# Patient Record
Sex: Male | Born: 1960 | Race: White | Hispanic: No | Marital: Married | Smoking: Never smoker
Health system: Southern US, Community
[De-identification: ages and names within clinical notes are randomized; demographics above are authoritative.]

## PROBLEM LIST (undated history)

## (undated) DIAGNOSIS — I10 Essential (primary) hypertension: Secondary | ICD-10-CM

## (undated) DIAGNOSIS — C189 Malignant neoplasm of colon, unspecified: Secondary | ICD-10-CM

## (undated) DIAGNOSIS — E119 Type 2 diabetes mellitus without complications: Secondary | ICD-10-CM

## (undated) HISTORY — PX: CATARACT EXTRACTION: SUR2

---

## 2009-09-23 ENCOUNTER — Ambulatory Visit (HOSPITAL_COMMUNITY): Admission: RE | Admit: 2009-09-23 | Discharge: 2009-09-23 | Payer: Self-pay | Admitting: Family Medicine

## 2020-12-26 ENCOUNTER — Emergency Department (HOSPITAL_COMMUNITY): Payer: Medicaid Other

## 2020-12-26 ENCOUNTER — Emergency Department (HOSPITAL_COMMUNITY)
Admission: EM | Admit: 2020-12-26 | Discharge: 2020-12-26 | Disposition: A | Discharge status: Home / Self Care | Payer: Medicaid Other | Attending: Emergency Medicine | Admitting: Emergency Medicine

## 2020-12-26 ENCOUNTER — Other Ambulatory Visit: Payer: Self-pay

## 2020-12-26 ENCOUNTER — Encounter (HOSPITAL_COMMUNITY): Payer: Self-pay | Admitting: *Deleted

## 2020-12-26 DIAGNOSIS — I10 Essential (primary) hypertension: Secondary | ICD-10-CM | POA: Diagnosis not present

## 2020-12-26 DIAGNOSIS — C787 Secondary malignant neoplasm of liver and intrahepatic bile duct: Secondary | ICD-10-CM | POA: Insufficient documentation

## 2020-12-26 DIAGNOSIS — R000 Tachycardia, unspecified: Secondary | ICD-10-CM | POA: Diagnosis not present

## 2020-12-26 DIAGNOSIS — C801 Malignant (primary) neoplasm, unspecified: Secondary | ICD-10-CM | POA: Diagnosis not present

## 2020-12-26 DIAGNOSIS — Z794 Long term (current) use of insulin: Secondary | ICD-10-CM | POA: Diagnosis not present

## 2020-12-26 DIAGNOSIS — E119 Type 2 diabetes mellitus without complications: Secondary | ICD-10-CM | POA: Diagnosis not present

## 2020-12-26 DIAGNOSIS — Z79899 Other long term (current) drug therapy: Secondary | ICD-10-CM | POA: Insufficient documentation

## 2020-12-26 DIAGNOSIS — R110 Nausea: Secondary | ICD-10-CM | POA: Diagnosis present

## 2020-12-26 HISTORY — DX: Essential (primary) hypertension: I10

## 2020-12-26 HISTORY — DX: Type 2 diabetes mellitus without complications: E11.9

## 2020-12-26 LAB — CBC WITH DIFFERENTIAL/PLATELET
Abs Immature Granulocytes: 0.15 10*3/uL — ABNORMAL HIGH (ref 0.00–0.07)
Basophils Absolute: 0.2 10*3/uL — ABNORMAL HIGH (ref 0.0–0.1)
Basophils Relative: 1 %
Eosinophils Absolute: 3.1 10*3/uL — ABNORMAL HIGH (ref 0.0–0.5)
Eosinophils Relative: 19 %
HCT: 35 % — ABNORMAL LOW (ref 39.0–52.0)
Hemoglobin: 11.7 g/dL — ABNORMAL LOW (ref 13.0–17.0)
Immature Granulocytes: 1 %
Lymphocytes Relative: 15 %
Lymphs Abs: 2.4 10*3/uL (ref 0.7–4.0)
MCH: 30.3 pg (ref 26.0–34.0)
MCHC: 33.4 g/dL (ref 30.0–36.0)
MCV: 90.7 fL (ref 80.0–100.0)
Monocytes Absolute: 1.2 10*3/uL — ABNORMAL HIGH (ref 0.1–1.0)
Monocytes Relative: 7 %
Neutro Abs: 9.3 10*3/uL — ABNORMAL HIGH (ref 1.7–7.7)
Neutrophils Relative %: 57 %
Platelets: 327 10*3/uL (ref 150–400)
RBC: 3.86 MIL/uL — ABNORMAL LOW (ref 4.22–5.81)
RDW: 16.6 % — ABNORMAL HIGH (ref 11.5–15.5)
Smear Review: NORMAL
WBC: 16.3 10*3/uL — ABNORMAL HIGH (ref 4.0–10.5)
nRBC: 0 % (ref 0.0–0.2)

## 2020-12-26 LAB — URINALYSIS, ROUTINE W REFLEX MICROSCOPIC
Bilirubin Urine: NEGATIVE
Glucose, UA: NEGATIVE mg/dL
Hgb urine dipstick: NEGATIVE
Ketones, ur: NEGATIVE mg/dL
Leukocytes,Ua: NEGATIVE
Nitrite: NEGATIVE
Protein, ur: NEGATIVE mg/dL
Specific Gravity, Urine: 1.016 (ref 1.005–1.030)
pH: 6 (ref 5.0–8.0)

## 2020-12-26 LAB — COMPREHENSIVE METABOLIC PANEL
ALT: 47 U/L — ABNORMAL HIGH (ref 0–44)
AST: 87 U/L — ABNORMAL HIGH (ref 15–41)
Albumin: 2.8 g/dL — ABNORMAL LOW (ref 3.5–5.0)
Alkaline Phosphatase: 543 U/L — ABNORMAL HIGH (ref 38–126)
Anion gap: 12 (ref 5–15)
BUN: 11 mg/dL (ref 6–20)
CO2: 28 mmol/L (ref 22–32)
Calcium: 9.4 mg/dL (ref 8.9–10.3)
Chloride: 94 mmol/L — ABNORMAL LOW (ref 98–111)
Creatinine, Ser: 0.75 mg/dL (ref 0.61–1.24)
GFR, Estimated: >60 mL/min (ref >60)
Glucose, Bld: 153 mg/dL — ABNORMAL HIGH (ref 70–99)
Potassium: 4.4 mmol/L (ref 3.5–5.1)
Sodium: 134 mmol/L — ABNORMAL LOW (ref 135–145)
Total Bilirubin: 2.5 mg/dL — ABNORMAL HIGH (ref 0.3–1.2)
Total Protein: 6.9 g/dL (ref 6.5–8.1)

## 2020-12-26 LAB — LIPASE, BLOOD: Lipase: 92 U/L — ABNORMAL HIGH (ref 11–51)

## 2020-12-26 MED ORDER — ONDANSETRON 4 MG PO TBDP: 4.0000 mg | ORAL_TABLET | Freq: Three times a day (TID) | ORAL | 0 refills | Status: DC | PRN

## 2020-12-26 MED ORDER — SODIUM CHLORIDE 0.9 % IV BOLUS
1000.0000 mL | Freq: Once | INTRAVENOUS | Status: AC
  Administered 2020-12-26: 1000 mL via INTRAVENOUS

## 2020-12-26 MED ORDER — IOHEXOL 300 MG/ML  SOLN
100.0000 mL | Freq: Once | INTRAMUSCULAR | Status: AC | PRN
  Administered 2020-12-26: 100 mL via INTRAVENOUS

## 2020-12-26 MED ORDER — ONDANSETRON HCL 4 MG/2ML IJ SOLN
4.0000 mg | Freq: Once | INTRAMUSCULAR | Status: AC
  Administered 2020-12-26: 4 mg via INTRAVENOUS
  Filled 2020-12-26: qty 2

## 2020-12-26 MED ORDER — FENTANYL CITRATE (PF) 100 MCG/2ML IJ SOLN
50.0000 ug | Freq: Once | INTRAMUSCULAR | Status: AC
  Administered 2020-12-26: 50 ug via INTRAVENOUS
  Filled 2020-12-26: qty 2

## 2020-12-26 NOTE — ED Triage Notes (Signed)
Constipation with nausea x 2 weeks, also has abdominal pain

## 2020-12-26 NOTE — ED Provider Notes (Signed)
Emergency Medicine Provider Triage Evaluation Note  Henry Johns 60 y.o. male was evaluated in triage.  Pt complains of abdominal pain, nausea/vomiting.  He states that he had hemorrhoid about 2 weeks ago.  He saw his primary care doctor and had some treatment for it but states that he was still scared to the bathroom as he thought was going to hurt.  He states he started decreasing his food intake.  He states he has not been able to have a good bowel movement about 2 weeks.  He states yesterday had a very small 1.  He is still passing flatus.  He states that he started having abdominal pain over the last week and is progressively worsened.  Today he started having vomiting, prompting ED visit.  No fevers.  No dysuria, hematuria.  He does have a history of hernia and states it feels slightly worse when pressed on.   Review of Systems  Positive: Abd pain, constipation, vomiting Negative: Fevers, urinary complaints.  Physical Exam  BP 134/82   Pulse 70   Temp 98.2 F (36.8 C) (Oral)   Resp 18   Ht 5\' 4"  (1.626 m)   Wt 65.8 kg   SpO2 100%   BMI 24.89 kg/m  Gen:   Awake, no distress   HEENT:  Atraumatic  Resp:  Normal effort  Cardiac:  Normal rate  Abd:   Slightly distended abdomen.  Diffuse tenderness.  Umbilical hernia noted.  No overlying warmth, erythema. MSK:   Moves extremities without difficulty  Neuro:  Speech clear   Other:      Medical Decision Making  Medically screening exam initiated at 6:00 PM  Appropriate orders placed.  Henry Johns was informed that the remainder of the evaluation will be completed by another provider, this initial triage assessment does not replace that evaluation. They are counseled that they will need to remain in the ED until the completion of their workup, including full H&P and results of any tests.  Risks of leaving the emergency department prior to completion of treatment were discussed. Patient was advised to inform ED staff if they are  leaving before their treatment is complete. The patient acknowledged these risks and time was allowed for questions.     The patient appears stable so that the remainder of the MSE may be completed by another provider.    Clinical Impression  Abd pain   Portions of this note were generated with Dragon dictation software. Dictation errors may occur despite best attempts at proofreading.     Henry Napoleon, PA-C 12/26/20 [place], Scott, MD 12/29/20 [phone]

## 2020-12-26 NOTE — ED Provider Notes (Signed)
[facility] EMERGENCY DEPARTMENT Provider Note   CSN: [id number] Arrival date & time: 12/26/20  1635     History Chief Complaint  Patient presents with   Nausea    Henry Johns is a 60 y.o. male.  HPI 60 year old male presents with nausea, dry heaving and constipation.  Had a hemorrhoid starting 2 weeks ago PCP and got treatment.  The resolved.  Has had minimal to no bowel movements since.  Whenever he does it small and hard.  Sometimes some blood.  Has had lower abdominal discomfort and the nausea/vomiting for about 3 days.  No fevers.  Some back pain when he tries to have a bowel movement.  Has an umbilical/ventral hernia and is worried this is causing problems.  Past Medical History:  Diagnosis Date   Diabetes mellitus without complication ([place])    Hypertension     There are no problems to display for this patient.   History reviewed. No pertinent surgical history.     No family history on file.  Social History   Substance Use Topics   Alcohol use: Not Currently   Drug use: Not Currently    Home Medications Prior to Admission medications   Medication Sig Start Date End Date Taking? Authorizing Provider  atenolol (TENORMIN) 50 MG tablet Take 50 mg by mouth daily. 11/07/20  Yes [provider]  insulin NPH-regular Human (70-30) 100 UNIT/ML injection Inject 15 Units into the skin daily with breakfast.   Yes [provider]  ondansetron (ZOFRAN ODT) 4 MG disintegrating tablet Take 1 tablet (4 mg total) by mouth every 8 (eight) hours as needed for nausea or vomiting. 12/26/20  Yes Sherwood Gambler, MD  PROCTO-MED [facility] 2.5 % rectal cream Apply 1 application topically 3 (three) times daily. 12/13/20  Yes [provider]    Allergies    Patient has no known allergies.  Review of Systems   Review of Systems  Constitutional:  Negative for fever.  Gastrointestinal:  Positive for abdominal pain, constipation, nausea and vomiting.   Musculoskeletal:  Positive for back pain.  All other systems reviewed and are negative.  Physical Exam Updated Vital Signs BP 123/75   Pulse 97   Temp 98.9 F (37.2 C) (Oral)   Resp 18   Ht 5\' 8"  (1.727 m)   Wt 86.2 kg   SpO2 100%   BMI 28.89 kg/m   Physical Exam Vitals and nursing note reviewed.  Constitutional:      General: He is not in acute distress.    Appearance: He is well-developed. He is not ill-appearing or diaphoretic.  HENT:     Head: Normocephalic and atraumatic.     Right Ear: External ear normal.     Left Ear: External ear normal.     Nose: Nose normal.  Eyes:     General:        Right eye: No discharge.        Left eye: No discharge.  Cardiovascular:     Rate and Rhythm: Regular rhythm. Tachycardia present.     Heart sounds: Normal heart sounds.  Pulmonary:     Effort: Pulmonary effort is normal.     Breath sounds: Normal breath sounds.  Abdominal:     Palpations: Abdomen is soft. There is hepatomegaly.     Tenderness: There is no abdominal tenderness.     Hernia: A hernia is present. Hernia is present in the ventral area (sizable hernia without obvious incarceration or tenderness).  Genitourinary:  Comments: No obvious hemorrhoids, masses, or fecal impaction.  There is no significant stool in the rectal vault.  No gross blood. Musculoskeletal:     Cervical back: Neck supple.  Skin:    General: Skin is warm and dry.  Neurological:     Mental Status: He is alert.  Psychiatric:        Mood and Affect: Mood is not anxious.    ED Results / Procedures / Treatments   Labs (all labs ordered are listed, but only abnormal results are displayed) Labs Reviewed  COMPREHENSIVE METABOLIC PANEL - Abnormal; Notable for the following components:      Result Value   Sodium 134 (*)    Chloride 94 (*)    Glucose, Bld 153 (*)    Albumin 2.8 (*)    AST 87 (*)    ALT 47 (*)    Alkaline Phosphatase 543 (*)    Total Bilirubin 2.5 (*)    All other  components within normal limits  CBC WITH DIFFERENTIAL/PLATELET - Abnormal; Notable for the following components:   WBC 16.3 (*)    RBC 3.86 (*)    Hemoglobin 11.7 (*)    HCT 35.0 (*)    RDW 16.6 (*)    Neutro Abs 9.3 (*)    Monocytes Absolute 1.2 (*)    Eosinophils Absolute 3.1 (*)    Basophils Absolute 0.2 (*)    Abs Immature Granulocytes 0.15 (*)    All other components within normal limits  URINALYSIS, ROUTINE W REFLEX MICROSCOPIC - Abnormal; Notable for the following components:   Color, Urine AMBER (*)    All other components within normal limits  LIPASE, BLOOD - Abnormal; Notable for the following components:   Lipase 92 (*)    All other components within normal limits  PATHOLOGIST SMEAR REVIEW    EKG None  Radiology CT Chest Wo Contrast  Result Date: 12/26/2020 CLINICAL DATA:  Abdominal pain, nausea vomiting. Follow-up to an abnormal abdomen and pelvis CT performed earlier today to assess for malignancy of unknown primary. EXAM: CT CHEST WITHOUT CONTRAST TECHNIQUE: Multidetector CT imaging of the chest was performed following the standard protocol without IV contrast. COMPARISON:  Current abdomen and pelvis CT. FINDINGS: Cardiovascular: Heart is normal in size and configuration. No pericardial effusion mild left coronary artery calcifications. Great vessels are normal in caliber. No aortic or arch branch vessel atherosclerotic calcifications. Mediastinum/Nodes: Visualized thyroid is unremarkable. No neck base, axillary, mediastinal or hilar masses or enlarged lymph nodes. Trachea and esophagus are unremarkable. Lungs/Pleura: Several lung nodules. Largest nodule, peripheral right lower lobe, image 96, series 4, 7 mm. Additional nodules: 4 mm nodule, left upper lobe, image 33. 3-4 mm nodule, right upper lobe, image 66. 6 mm nodule, right middle lobe abutting the minor fissure, image 70. Two small calcified granuloma in the right middle lobe. Minor linear atelectasis at the posterior  lung bases. Remainder of the lungs is clear. No pleural effusion or pneumothorax. Upper Abdomen: No change compared to abdomen and pelvis CT obtained earlier today. Musculoskeletal: No fracture or acute finding. No osteoblastic or osteolytic lesions. IMPRESSION: 1. Several small lung nodules consistent with metastatic disease. The primary malignancy remains unclear. The liver morphology suggests underlying cirrhosis, and multifocal hepatocellular carcinoma should be included in the differential diagnosis. 2. No acute abnormality in the chest. Electronically Signed   By: Lajean Manes M.D.   On: 12/26/2020 21:26   CT ABDOMEN PELVIS W CONTRAST  Result Date: 12/26/2020 CLINICAL DATA:  60 year old male with abdominal pain. EXAM: CT ABDOMEN AND PELVIS WITH CONTRAST TECHNIQUE: Multidetector CT imaging of the abdomen and pelvis was performed using the standard protocol following bolus administration of intravenous contrast. CONTRAST:  124mL OMNIPAQUE IOHEXOL 300 MG/ML  SOLN COMPARISON:  None. FINDINGS: Lower chest: There is a 7 mm nodule in the right lung base, likely metastatic disease. Chest CT is recommended for complete evaluation of the lungs. No intra-abdominal free air. Small ascites. Hepatobiliary: Multiple (greater than 20) hepatic hypodense lesions measuring up to 2.9 x 3.4 cm in the left lobe of the liver consistent with metastatic disease. No intrahepatic biliary ductal dilatation. The gallbladder is unremarkable. Pancreas: Unremarkable. No pancreatic ductal dilatation or surrounding inflammatory changes. Spleen: Normal in size without focal abnormality. Adrenals/Urinary Tract: The adrenal glands unremarkable. Subcentimeter right renal interpolar hypodense lesion is not characterized, likely a cyst. There is no hydronephrosis on either side. There is symmetric enhancement and excretion of contrast by both kidneys. The visualized ureters and urinary bladder appear unremarkable. Stomach/Bowel: There is no  bowel obstruction or active inflammation. The appendix is normal. Vascular/Lymphatic: Mild aortoiliac atherosclerotic disease. The IVC is unremarkable. No portal venous gas. Retroperitoneal/aortocaval adenopathy measure up to 16 mm in short axis. There is mild nodularity of the omentum in the right hemiabdomen (42/2). Reproductive: The prostate and seminal vesicles are grossly unremarkable. Other: None Musculoskeletal: Osteopenia with degenerative changes of the spine. No acute osseous pathology. IMPRESSION: 1. Extensive hepatic metastatic disease and retroperitoneal adenopathy. 2. A 7 mm right lung base nodule, most consistent with metastatic disease. 3. No bowel obstruction. Normal appendix. 4. Aortic Atherosclerosis (ICD10-I70.0). Electronically Signed   By: Anner Crete M.D.   On: 12/26/2020 20:45    Procedures Procedures   Medications Ordered in ED Medications  fentaNYL (SUBLIMAZE) injection 50 mcg (50 mcg Intravenous Given 12/26/20 2011)  ondansetron [facility]) injection 4 mg (4 mg Intravenous Given 12/26/20 2011)  sodium chloride 0.9 % bolus 1,000 mL (1,000 mLs Intravenous New Bag/Given 12/26/20 2010)  iohexol (OMNIPAQUE) 300 MG/ML solution 100 mL (100 mLs Intravenous Contrast Given 12/26/20 2017)    ED Course  I have reviewed the triage vital signs and the nursing notes.  Pertinent labs & imaging results that were available during my care of the patient were reviewed by me and considered in my medical decision making (see chart for details).    MDM Rules/Calculators/A&P                          Patient is well-appearing.  He feels symptomatically better after treatment in the ED.  Based on his presentation and labs a CT was ordered and reviewed.  It unfortunately shows numerous liver lesions consistent with metastases.  CT chest added on with a couple nodules that are concerning for metastasis.  No clear primary.  He does not smoke or drink alcohol and did not in the past.  At this point,  I have discussed with Dr. Delton Coombes.  He does have mild LFTs abnormalities including a bilirubin of 2.6.  At this point he is otherwise stable for discharge however and we discussed the need for expedited care as an outpatient and Dr. Tomie China office will follow-up closely to arrange work-up and treatment.  Discussed options with patient and he agrees and we discussed return precautions. Final Clinical Impression(s) / ED Diagnoses Final diagnoses:  Liver metastases ([place])    Rx / DC Orders ED Discharge Orders  Ordered    ondansetron (ZOFRAN ODT) 4 MG disintegrating tablet  Every 8 hours PRN        12/26/20 2216             Sherwood Gambler, MD 12/26/20 2218

## 2020-12-26 NOTE — Discharge Instructions (Addendum)
If you develop worsening, continued, or recurrent abdominal pain, uncontrolled vomiting, fever, chest or back pain, or any other new/concerning symptoms then return to the ER for evaluation.  

## 2020-12-27 LAB — PATHOLOGIST SMEAR REVIEW

## 2021-01-04 ENCOUNTER — Encounter (HOSPITAL_COMMUNITY): Payer: Self-pay | Admitting: Hematology and Oncology

## 2021-01-04 ENCOUNTER — Inpatient Hospital Stay (HOSPITAL_COMMUNITY): Payer: Medicaid Other | Attending: Hematology | Admitting: Hematology and Oncology

## 2021-01-04 ENCOUNTER — Other Ambulatory Visit: Payer: Self-pay

## 2021-01-04 DIAGNOSIS — I10 Essential (primary) hypertension: Secondary | ICD-10-CM | POA: Diagnosis not present

## 2021-01-04 DIAGNOSIS — C7801 Secondary malignant neoplasm of right lung: Secondary | ICD-10-CM | POA: Insufficient documentation

## 2021-01-04 DIAGNOSIS — R112 Nausea with vomiting, unspecified: Secondary | ICD-10-CM

## 2021-01-04 DIAGNOSIS — R110 Nausea: Secondary | ICD-10-CM | POA: Insufficient documentation

## 2021-01-04 DIAGNOSIS — C7800 Secondary malignant neoplasm of unspecified lung: Secondary | ICD-10-CM

## 2021-01-04 DIAGNOSIS — Z794 Long term (current) use of insulin: Secondary | ICD-10-CM | POA: Diagnosis not present

## 2021-01-04 DIAGNOSIS — C787 Secondary malignant neoplasm of liver and intrahepatic bile duct: Secondary | ICD-10-CM | POA: Insufficient documentation

## 2021-01-04 DIAGNOSIS — G893 Neoplasm related pain (acute) (chronic): Secondary | ICD-10-CM | POA: Insufficient documentation

## 2021-01-04 DIAGNOSIS — K625 Hemorrhage of anus and rectum: Secondary | ICD-10-CM | POA: Diagnosis not present

## 2021-01-04 DIAGNOSIS — K5909 Other constipation: Secondary | ICD-10-CM | POA: Insufficient documentation

## 2021-01-04 DIAGNOSIS — E1140 Type 2 diabetes mellitus with diabetic neuropathy, unspecified: Secondary | ICD-10-CM | POA: Diagnosis not present

## 2021-01-04 DIAGNOSIS — M8580 Other specified disorders of bone density and structure, unspecified site: Secondary | ICD-10-CM | POA: Insufficient documentation

## 2021-01-04 DIAGNOSIS — C189 Malignant neoplasm of colon, unspecified: Secondary | ICD-10-CM | POA: Insufficient documentation

## 2021-01-04 DIAGNOSIS — C7802 Secondary malignant neoplasm of left lung: Secondary | ICD-10-CM | POA: Diagnosis not present

## 2021-01-04 MED ORDER — PROCHLORPERAZINE MALEATE 10 MG PO TABS: 10.0000 mg | ORAL_TABLET | Freq: Four times a day (QID) | ORAL | 3 refills | Status: DC | PRN

## 2021-01-04 MED ORDER — POLYETHYLENE GLYCOL 3350 17 G PO PACK: 17.0000 g | PACK | Freq: Every day | ORAL | 3 refills | Status: DC

## 2021-01-04 MED ORDER — MORPHINE SULFATE 15 MG PO TABS: 15.0000 mg | ORAL_TABLET | ORAL | 0 refills | Status: DC | PRN

## 2021-01-04 NOTE — Assessment & Plan Note (Signed)
He has bilateral lung nodules most consistent with metastatic cancer to his lungs He is not symptomatic Observe for now

## 2021-01-04 NOTE — Progress Notes (Signed)
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 [facility]) He has bilateral lung nodules most consistent with metastatic cancer to his lungs He is not symptomatic Observe for now  Other constipation Likely due to primary colorectal cancer I warned him that pain medicine can also cause constipation I recommend daily MiraLAX  Nausea without vomiting Likely due to his cancer I have prescribed antiemetics for him to take as needed  Cancer associated pain We discussed narcotic refill policy I warned him about side effects of treatment I will see him again in 1 to 2 weeks for further follow-up and we will call him in a day or 2 to assess pain control  Diabetes mellitus with neuropathy ([place]) With his progressive weight loss, I recommend he checks his blood sugar regularly and to stop taking insulin if his blood sugar is consistently under 150 to avoid hypoglycemia  Orders Placed This Encounter  Procedures   CT Biopsy    Standing Status:   Future    Standing Expiration Date:   01/04/2022    Order Specific Question:   Lab orders requested (DO NOT place separate lab orders, these will be automatically ordered during procedure specimen collection):    Answer:   Surgical Pathology    Order Specific Question:   Reason for Exam (SYMPTOM  OR DIAGNOSIS REQUIRED)    Answer:   liver lesions    Order Specific Question:   Preferred location?    Answer:   [facility]    Order  Specific Question:   Release to patient    Answer:   Immediate    The total time spent in the appointment was 60 minutes encounter with patients including review of chart and various tests results, discussions about plan of care and coordination of care plan   All questions were answered. The patient knows to call the clinic with any problems, questions or concerns. No barriers to learning was detected.  Heath Lark, MD 6/29/202211:04 AM  CHIEF COMPLAINTS/PURPOSE OF CONSULTATION:  Abnormal imaging study with metastatic cancer in the liver and lungs  HISTORY OF PRESENTING ILLNESS:  Henry Johns 60 y.o. male is here because of recent abnormal CT findings The patient is here accompanied by his wife He has background history of type 2 diabetes on insulin treatment He has mild diabetic neuropathy and eye changes because of this Over the past few weeks, he has not been feeling well He has frequent nausea with vomiting, especially within 1 hour or so after eating.  He saw a physician several weeks ago for hemorrhoidal bleeding and was prescribed a gel that seems to shrink his hemorrhoid somewhat He has sensation of incomplete bowel emptying and passage of bright red blood intermittently He has never had screening colonoscopy He has lost 3 to 4 pounds of weight due to fear of eating.  He has significant pain in his lower abdomen in the sacral area When he presented to the emergency department, he had CT  imaging which show metastatic cancer in his liver and lungs He was prescribed 5 mg of oxycodone that did not help his pain He rated his pain at 10 out of 10 He is currently not working MEDICAL HISTORY:  Past Medical History:  Diagnosis Date   Diabetes mellitus without complication ([place])    Hypertension     SURGICAL HISTORY: History reviewed. No pertinent surgical history.  SOCIAL HISTORY: Social History   Socioeconomic History   Marital status: Single    Spouse name: Not on file    Number of children: Not on file   Years of education: Not on file   Highest education level: Not on file  Occupational History   Not on file  Tobacco Use   Smoking status: Never   Smokeless tobacco: Never  Vaping Use   Vaping Use: Never used  Substance and Sexual Activity   Alcohol use: Not Currently   Drug use: Not Currently   Sexual activity: Not Currently  Other Topics Concern   Not on file  Social History Narrative   Married, no childer   Social Determinants of Health   Financial Resource Strain: Not on file  Food Insecurity: Not on file  Transportation Needs: No Transportation Needs   Lack of Transportation (Medical): No   Lack of Transportation (Non-Medical): No  Physical Activity: Inactive   Days of Exercise per Week: 0 days   Minutes of Exercise per Session: 0 min  Stress: Not on file  Social Connections: Not on file  Intimate Partner Violence: Not At Risk   Fear of Current or Ex-Partner: No   Emotionally Abused: No   Physically Abused: No   Sexually Abused: No    FAMILY HISTORY: Family History  Problem Relation Age of Onset   Cancer Father 4       prostate ca    ALLERGIES:  has No Known Allergies.  MEDICATIONS:  Current Outpatient Medications  Medication Sig Dispense Refill   atenolol (TENORMIN) 50 MG tablet Take 50 mg by mouth daily.     insulin NPH-regular Human (70-30) 100 UNIT/ML injection Inject 15 Units into the skin daily with breakfast.     morphine (MSIR) 15 MG tablet Take 1 tablet (15 mg total) by mouth every 4 (four) hours as needed for severe pain. 30 tablet 0   ondansetron (ZOFRAN ODT) 4 MG disintegrating tablet Take 1 tablet (4 mg total) by mouth every 8 (eight) hours as needed for nausea or vomiting. 10 tablet 0   polyethylene glycol (MIRALAX) 17 g packet Take 17 g by mouth daily. 30 each 3   prochlorperazine (COMPAZINE) 10 MG tablet Take 1 tablet (10 mg total) by mouth every 6 (six) hours as needed for nausea or vomiting. 30 tablet 3    PROCTO-MED HC 2.5 % rectal cream Apply 1 application topically 3 (three) times daily.     No current facility-administered medications for this visit.    REVIEW OF SYSTEMS:   Constitutional: Denies fevers, chills or abnormal night sweats Eyes: Denies blurriness of vision, double vision or watery eyes Ears, nose, mouth, throat, and face: Denies mucositis or sore throat Respiratory: Denies cough, dyspnea or wheezes Cardiovascular: Denies palpitation, chest discomfort or lower extremity swelling Skin: Denies abnormal skin rashes Lymphatics: Denies new lymphadenopathy or easy bruising Neurological:Denies numbness, tingling or new weaknesses Behavioral/Psych: Mood is stable, no new changes  All other systems were reviewed with the patient and are negative.  PHYSICAL EXAMINATION: ECOG PERFORMANCE STATUS: 1 - Symptomatic but  completely ambulatory  Vitals:   01/04/21 0824  BP: (!) 97/57  Pulse: 98  Resp: 18  Temp: 98.7 F (37.1 C)  SpO2: 100%   Filed Weights   01/04/21 0824  Weight: 181 lb (82.1 kg)    GENERAL:alert, no distress and comfortable.  He looks slightly jaundiced SKIN: skin color, texture, turgor are normal, no rashes or significant lesions EYES: normal, conjunctiva are pink and non-injected, sclera clear OROPHARYNX:no exudate, no erythema and lips, buccal mucosa, and tongue normal  NECK: supple, thyroid normal size, non-tender, without nodularity LYMPH:  no palpable lymphadenopathy in the cervical, axillary or inguinal LUNGS: clear to auscultation and percussion with normal breathing effort HEART: regular rate & rhythm and no murmurs and no lower extremity edema ABDOMEN:abdomen soft, palpable right upper quadrant mass Musculoskeletal:no cyanosis of digits and no clubbing  PSYCH: alert & oriented x 3 with fluent speech NEURO: no focal motor/sensory deficits  LABORATORY DATA:  I have reviewed the data as listed Lab Results  Component Value Date   WBC 16.3 (H)  12/26/2020   HGB 11.7 (L) 12/26/2020   HCT 35.0 (L) 12/26/2020   MCV 90.7 12/26/2020   PLT 327 12/26/2020   Recent Labs    12/26/20 1834  NA 134*  K 4.4  CL 94*  CO2 28  GLUCOSE 153*  BUN 11  CREATININE 0.75  CALCIUM 9.4  GFRNONAA >60  PROT 6.9  ALBUMIN 2.8*  AST 87*  ALT 47*  ALKPHOS 543*  BILITOT 2.5*    RADIOGRAPHIC STUDIES: I have reviewed multiple CT imaging with the patient and his wife I have personally reviewed the radiological images as listed and agreed with the findings in the report. CT Chest Wo Contrast  Result Date: 12/26/2020 CLINICAL DATA:  Abdominal pain, nausea vomiting. Follow-up to an abnormal abdomen and pelvis CT performed earlier today to assess for malignancy of unknown primary. EXAM: CT CHEST WITHOUT CONTRAST TECHNIQUE: Multidetector CT imaging of the chest was performed following the standard protocol without IV contrast. COMPARISON:  Current abdomen and pelvis CT. FINDINGS: Cardiovascular: Heart is normal in size and configuration. No pericardial effusion mild left coronary artery calcifications. Great vessels are normal in caliber. No aortic or arch branch vessel atherosclerotic calcifications. Mediastinum/Nodes: Visualized thyroid is unremarkable. No neck base, axillary, mediastinal or hilar masses or enlarged lymph nodes. Trachea and esophagus are unremarkable. Lungs/Pleura: Several lung nodules. Largest nodule, peripheral right lower lobe, image 96, series 4, 7 mm. Additional nodules: 4 mm nodule, left upper lobe, image 33. 3-4 mm nodule, right upper lobe, image 66. 6 mm nodule, right middle lobe abutting the minor fissure, image 70. Two small calcified granuloma in the right middle lobe. Minor linear atelectasis at the posterior lung bases. Remainder of the lungs is clear. No pleural effusion or pneumothorax. Upper Abdomen: No change compared to abdomen and pelvis CT obtained earlier today. Musculoskeletal: No fracture or acute finding. No osteoblastic or  osteolytic lesions. IMPRESSION: 1. Several small lung nodules consistent with metastatic disease. The primary malignancy remains unclear. The liver morphology suggests underlying cirrhosis, and multifocal hepatocellular carcinoma should be included in the differential diagnosis. 2. No acute abnormality in the chest. Electronically Signed   By: Lajean Manes M.D.   On: 12/26/2020 21:26   CT ABDOMEN PELVIS W CONTRAST  Result Date: 12/26/2020 CLINICAL DATA:  60 year old male with abdominal pain. EXAM: CT ABDOMEN AND PELVIS WITH CONTRAST TECHNIQUE: Multidetector CT imaging of the abdomen and pelvis was performed using the standard protocol following  bolus administration of intravenous contrast. CONTRAST:  147mL OMNIPAQUE IOHEXOL 300 MG/ML  SOLN COMPARISON:  None. FINDINGS: Lower chest: There is a 7 mm nodule in the right lung base, likely metastatic disease. Chest CT is recommended for complete evaluation of the lungs. No intra-abdominal free air. Small ascites. Hepatobiliary: Multiple (greater than 20) hepatic hypodense lesions measuring up to 2.9 x 3.4 cm in the left lobe of the liver consistent with metastatic disease. No intrahepatic biliary ductal dilatation. The gallbladder is unremarkable. Pancreas: Unremarkable. No pancreatic ductal dilatation or surrounding inflammatory changes. Spleen: Normal in size without focal abnormality. Adrenals/Urinary Tract: The adrenal glands unremarkable. Subcentimeter right renal interpolar hypodense lesion is not characterized, likely a cyst. There is no hydronephrosis on either side. There is symmetric enhancement and excretion of contrast by both kidneys. The visualized ureters and urinary bladder appear unremarkable. Stomach/Bowel: There is no bowel obstruction or active inflammation. The appendix is normal. Vascular/Lymphatic: Mild aortoiliac atherosclerotic disease. The IVC is unremarkable. No portal venous gas. Retroperitoneal/aortocaval adenopathy measure up to 16 mm in  short axis. There is mild nodularity of the omentum in the right hemiabdomen (42/2). Reproductive: The prostate and seminal vesicles are grossly unremarkable. Other: None Musculoskeletal: Osteopenia with degenerative changes of the spine. No acute osseous pathology. IMPRESSION: 1. Extensive hepatic metastatic disease and retroperitoneal adenopathy. 2. A 7 mm right lung base nodule, most consistent with metastatic disease. 3. No bowel obstruction. Normal appendix. 4. Aortic Atherosclerosis (ICD10-I70.0). Electronically Signed   By: Anner Crete M.D.   On: 12/26/2020 20:45

## 2021-01-04 NOTE — Assessment & Plan Note (Signed)
We discussed narcotic refill policy I warned him about side effects of treatment I will see him again in 1 to 2 weeks for further follow-up and we will call him in a day or 2 to assess pain control

## 2021-01-04 NOTE — Assessment & Plan Note (Signed)
Likely due to primary colorectal cancer I warned him that pain medicine can also cause constipation I recommend daily MiraLAX

## 2021-01-04 NOTE — Assessment & Plan Note (Signed)
With his progressive weight loss, I recommend he checks his blood sugar regularly and to stop taking insulin if his blood sugar is consistently under 150 to avoid hypoglycemia

## 2021-01-04 NOTE — Assessment & Plan Note (Signed)
He has signs of uncompensated liver failure The pattern of disease seen in his liver is most likely metastatic cancer from the GI tract, most likely from his colon The patient had recent changes in bowel habits, rectal bleeding and nausea He had never had screening colonoscopy done I am concerned about potential risk of partial small bowel obstruction I will order urgent GI evaluation for colonoscopy

## 2021-01-04 NOTE — Assessment & Plan Note (Signed)
Likely due to his cancer I have prescribed antiemetics for him to take as needed

## 2021-01-05 ENCOUNTER — Telehealth (INDEPENDENT_AMBULATORY_CARE_PROVIDER_SITE_OTHER): Payer: Self-pay

## 2021-01-05 ENCOUNTER — Encounter (HOSPITAL_COMMUNITY): Payer: Self-pay

## 2021-01-05 ENCOUNTER — Encounter: Payer: Self-pay | Admitting: Internal Medicine

## 2021-01-05 ENCOUNTER — Other Ambulatory Visit (INDEPENDENT_AMBULATORY_CARE_PROVIDER_SITE_OTHER): Payer: Self-pay

## 2021-01-05 DIAGNOSIS — C787 Secondary malignant neoplasm of liver and intrahepatic bile duct: Secondary | ICD-10-CM

## 2021-01-05 MED ORDER — PEG 3350-KCL-NA BICARB-NACL 420 G PO SOLR: 4000.0000 mL | ORAL | 0 refills | Status: DC

## 2021-01-05 NOTE — Telephone Encounter (Signed)
Ok to schedule.  Thanks,  Chimene Salo Castaneda Mayorga, MD Gastroenterology and Hepatology [facility]  

## 2021-01-05 NOTE — Telephone Encounter (Signed)
Referring MD/PCP: [facility]  Procedure: Tcs/Egd  Reason/Indication:  Metastatic Liver Cancer  Has patient had this procedure before?  no  If so, when, by whom and where?    Is there a family history of colon cancer?  no  Who?  What age when diagnosed?    Is patient diabetic? If yes, Type 1 or Type 2   yes type 2      Does patient have prosthetic heart valve or mechanical valve?  no  Do you have a pacemaker/defibrillator?  no  Has patient ever had endocarditis/atrial fibrillation? no  Does patient use oxygen? no  Has patient had joint replacement within last 12 months?  no  Is patient constipated or do they take laxatives? Yes, constipated due to morphine for pain  Does patient have a history of alcohol/drug use?  no  Have you had a stroke/heart attack last 6 mths? no  Do you take medicine for weight loss?  no  For male patients,: do you still have your menstrual cycle? N/A  Is patient on blood thinner such as Coumadin, Plavix and/or Aspirin? no  Medications: Atenolol 50 mgd daily, Compazine 10 mg prn, Morphine 15 mg up to qid, Insulin 70/30 12/5 units bid  Allergies: nkda  Medication Adjustment per Dr Jenetta Downer none  Procedure date & time: 01/13/21 11:05

## 2021-01-05 NOTE — Progress Notes (Unsigned)
       Patient Demographics  Patient Name  Henry Johns, Henry Johns Legal Sex  Male DOB  January 16, 1961 SSN  [id number] Address  [place]  [place] [address] Phone  [phone] [facility])  [phone] (Mobile)     RE: Biopsy Received: Today Markus Daft, MD  Ernestene Mention for US guided liver biopsy.  May have to do small volume paracentesis as well.   [place]         Previous Messages    ----- Message -----  From: Lenore Cordia  Sent: 01/04/2021   4:08 PM EDT  To: [facility] Procedure Requests  Subject: Biopsy                                         Procedure Requested:  CT Biopsy    Reason for Procedure: liver lesions    Provider Requesting:  Dr. Alvy Bimler  Provider Telephone:  [phone]    Other Info:

## 2021-01-05 NOTE — Telephone Encounter (Signed)
LeighAnn Iretha Kirley, CMA  

## 2021-01-06 ENCOUNTER — Other Ambulatory Visit: Payer: Self-pay | Admitting: Student

## 2021-01-06 ENCOUNTER — Encounter (INDEPENDENT_AMBULATORY_CARE_PROVIDER_SITE_OTHER): Payer: Self-pay

## 2021-01-06 ENCOUNTER — Telehealth: Payer: Self-pay

## 2021-01-06 ENCOUNTER — Other Ambulatory Visit: Payer: Self-pay | Admitting: Radiology

## 2021-01-06 NOTE — Telephone Encounter (Signed)
Called and left below message. Ask him to call the office back. ?

## 2021-01-06 NOTE — Telephone Encounter (Signed)
He called back and left a message. He appreciated the call. The medications are helping. He is doing much better. Denies constipation.

## 2021-01-06 NOTE — Telephone Encounter (Signed)
-----   Message from Heath Lark, MD sent at 01/06/2021  7:53 AM EDT ----- Can you check on him and ask how his pain is doing? Make sur ehe is not constipated with pain meds

## 2021-01-10 ENCOUNTER — Other Ambulatory Visit: Payer: Self-pay

## 2021-01-10 ENCOUNTER — Ambulatory Visit (HOSPITAL_COMMUNITY)
Admission: RE | Admit: 2021-01-10 | Discharge: 2021-01-10 | Disposition: A | Discharge status: Home / Self Care | Payer: Medicaid Other | Source: Ambulatory Visit | Attending: Hematology and Oncology | Admitting: Hematology and Oncology

## 2021-01-10 DIAGNOSIS — K769 Liver disease, unspecified: Secondary | ICD-10-CM | POA: Diagnosis present

## 2021-01-10 DIAGNOSIS — E119 Type 2 diabetes mellitus without complications: Secondary | ICD-10-CM | POA: Insufficient documentation

## 2021-01-10 DIAGNOSIS — Z794 Long term (current) use of insulin: Secondary | ICD-10-CM | POA: Diagnosis not present

## 2021-01-10 DIAGNOSIS — C7800 Secondary malignant neoplasm of unspecified lung: Secondary | ICD-10-CM | POA: Diagnosis not present

## 2021-01-10 DIAGNOSIS — C787 Secondary malignant neoplasm of liver and intrahepatic bile duct: Secondary | ICD-10-CM | POA: Diagnosis not present

## 2021-01-10 DIAGNOSIS — I10 Essential (primary) hypertension: Secondary | ICD-10-CM | POA: Diagnosis not present

## 2021-01-10 DIAGNOSIS — Z79899 Other long term (current) drug therapy: Secondary | ICD-10-CM | POA: Insufficient documentation

## 2021-01-10 HISTORY — PX: LIVER BIOPSY: SHX301

## 2021-01-10 LAB — CBC
HCT: 36.5 % — ABNORMAL LOW (ref 39.0–52.0)
Hemoglobin: 12.2 g/dL — ABNORMAL LOW (ref 13.0–17.0)
MCH: 30.3 pg (ref 26.0–34.0)
MCHC: 33.4 g/dL (ref 30.0–36.0)
MCV: 90.8 fL (ref 80.0–100.0)
Platelets: 383 10*3/uL (ref 150–400)
RBC: 4.02 MIL/uL — ABNORMAL LOW (ref 4.22–5.81)
RDW: 14.8 % (ref 11.5–15.5)
WBC: 15.3 10*3/uL — ABNORMAL HIGH (ref 4.0–10.5)
nRBC: 0 % (ref 0.0–0.2)

## 2021-01-10 LAB — GLUCOSE, CAPILLARY: Glucose-Capillary: 134 mg/dL — ABNORMAL HIGH (ref 70–99)

## 2021-01-10 LAB — PROTIME-INR
INR: 1 (ref 0.8–1.2)
Prothrombin Time: 13.6 seconds (ref 11.4–15.2)

## 2021-01-10 MED ORDER — FENTANYL CITRATE (PF) 100 MCG/2ML IJ SOLN
INTRAMUSCULAR | Status: AC
  Filled 2021-01-10: qty 4

## 2021-01-10 MED ORDER — MIDAZOLAM HCL 2 MG/2ML IJ SOLN
INTRAMUSCULAR | Status: AC | PRN
  Administered 2021-01-10: 1 mg via INTRAVENOUS

## 2021-01-10 MED ORDER — GELATIN ABSORBABLE 12-7 MM EX MISC
CUTANEOUS | Status: AC
  Filled 2021-01-10: qty 1

## 2021-01-10 MED ORDER — FENTANYL CITRATE (PF) 100 MCG/2ML IJ SOLN
INTRAMUSCULAR | Status: AC | PRN
  Administered 2021-01-10: 50 ug via INTRAVENOUS

## 2021-01-10 MED ORDER — LIDOCAINE HCL (PF) 1 % IJ SOLN
INTRAMUSCULAR | Status: AC
  Filled 2021-01-10: qty 30

## 2021-01-10 MED ORDER — MIDAZOLAM HCL 2 MG/2ML IJ SOLN
INTRAMUSCULAR | Status: AC
  Filled 2021-01-10: qty 4

## 2021-01-10 MED ORDER — SODIUM CHLORIDE 0.9 % IV SOLN: INTRAVENOUS | Status: DC

## 2021-01-10 NOTE — Patient Instructions (Addendum)
Henry Johns  01/10/2021     @PREFPERIOPPHARMACY @   Your procedure is scheduled on  01/13/2021.  Report to [facility] at  [place] A.M.  Call this number if you have problems the morning of surgery:  [phone]   Remember:  Follow the diet and prep instructions given to you by the office.    Take these medicines the morning of surgery with A SIP OF WATER    atenolol, MSIR(if needed), zofran or compazine (if needed).    Do not wear jewelry, make-up or nail polish.  Do not wear lotions, powders, or perfumes, or deodorant.  Do not shave 48 hours prior to surgery.  Men may shave face and neck.  Do not bring valuables to the hospital.  [facility] is not responsible for any belongings or valuables.  Contacts, dentures or bridgework may not be worn into surgery.  Leave your suitcase in the car.  After surgery it may be brought to your room.  For patients admitted to the hospital, discharge time will be determined by your treatment team.  Patients discharged the day of surgery will not be allowed to drive home and must have someone with them for 24 hours.    Special instructions:   DO NOT smoke tobacco or vape for 24 hours before your procedure.  Please read over the following fact sheets that you were given. Anesthesia Post-op Instructions and Care and Recovery After Surgery      Upper Endoscopy, Adult, Care After This sheet gives you information about how to care for yourself after your procedure. Your health care provider may also give you more specific instructions. If you have problems or questions, contact your health careprovider. What can I expect after the procedure? After the procedure, it is common to have: A sore throat. Mild stomach pain or discomfort. Bloating. Nausea. Follow these instructions at home:  Follow instructions from your health care provider about what to eat or drink after your procedure. Return to your normal activities as told by  your health care provider. Ask your health care provider what activities are safe for you. Take over-the-counter and prescription medicines only as told by your health care provider. If you were given a sedative during the procedure, it can affect you for several hours. Do not drive or operate machinery until your health care provider says that it is safe. Keep all follow-up visits as told by your health care provider. This is important. Contact a health care provider if you have: A sore throat that lasts longer than one day. Trouble swallowing. Get help right away if: You vomit blood or your vomit looks like coffee grounds. You have: A fever. Bloody, black, or tarry stools. A severe sore throat or you cannot swallow. Difficulty breathing. Severe pain in your chest or abdomen. Summary After the procedure, it is common to have a sore throat, mild stomach discomfort, bloating, and nausea. If you were given a sedative during the procedure, it can affect you for several hours. Do not drive or operate machinery until your health care provider says that it is safe. Follow instructions from your health care provider about what to eat or drink after your procedure. Return to your normal activities as told by your health care provider. This information is not intended to replace advice given to you by your health care provider. Make sure you discuss any questions you have with your healthcare provider. Document Revised: 06/23/2019 Document  Reviewed: 11/25/2017 Elsevier Patient Education  2022 [employer]. Colonoscopy, Adult, Care After This sheet gives you information about how to care for yourself after your procedure. Your health care provider may also give you more specific instructions. If you have problems or questions, contact your health careprovider. What can I expect after the procedure? After the procedure, it is common to have: A small amount of blood in your stool for 24 hours after  the procedure. Some gas. Mild cramping or bloating of your abdomen. Follow these instructions at home: Eating and drinking  Drink enough fluid to keep your urine pale yellow. Follow instructions from your health care provider about eating or drinking restrictions. Resume your normal diet as instructed by your health care provider. Avoid heavy or fried foods that are hard to digest.  Activity Rest as told by your health care provider. Avoid sitting for a long time without moving. Get up to take short walks every 1-2 hours. This is important to improve blood flow and breathing. Ask for help if you feel weak or unsteady. Return to your normal activities as told by your health care provider. Ask your health care provider what activities are safe for you. Managing cramping and bloating  Try walking around when you have cramps or feel bloated. Apply heat to your abdomen as told by your health care provider. Use the heat source that your health care provider recommends, such as a moist heat pack or a heating pad. Place a towel between your skin and the heat source. Leave the heat on for 20-30 minutes. Remove the heat if your skin turns bright red. This is especially important if you are unable to feel pain, heat, or cold. You may have a greater risk of getting burned.  General instructions If you were given a sedative during the procedure, it can affect you for several hours. Do not drive or operate machinery until your health care provider says that it is safe. For the first 24 hours after the procedure: Do not sign important documents. Do not drink alcohol. Do your regular daily activities at a slower pace than normal. Eat soft foods that are easy to digest. Take over-the-counter and prescription medicines only as told by your health care provider. Keep all follow-up visits as told by your health care provider. This is important. Contact a health care provider if: You have blood in your  stool 2-3 days after the procedure. Get help right away if you have: More than a small spotting of blood in your stool. Large blood clots in your stool. Swelling of your abdomen. Nausea or vomiting. A fever. Increasing pain in your abdomen that is not relieved with medicine. Summary After the procedure, it is common to have a small amount of blood in your stool. You may also have mild cramping and bloating of your abdomen. If you were given a sedative during the procedure, it can affect you for several hours. Do not drive or operate machinery until your health care provider says that it is safe. Get help right away if you have a lot of blood in your stool, nausea or vomiting, a fever, or increased pain in your abdomen. This information is not intended to replace advice given to you by your health care provider. Make sure you discuss any questions you have with your healthcare provider. Document Revised: 06/19/2019 Document Reviewed: 01/19/2019 Elsevier Patient Education  [place] After This sheet gives you information about how to  care for yourself after your procedure. Your health care provider may also give you more specific instructions. If you have problems or questions, contact your health careprovider. What can I expect after the procedure? After the procedure, it is common to have: Tiredness. Forgetfulness about what happened after the procedure. Impaired judgment for important decisions. Nausea or vomiting. Some difficulty with balance. Follow these instructions at home: For the time period you were told by your health care provider:     Rest as needed. Do not participate in activities where you could fall or become injured. Do not drive or use machinery. Do not drink alcohol. Do not take sleeping pills or medicines that cause drowsiness. Do not make important decisions or sign legal documents. Do not take care of children on your  own. Eating and drinking Follow the diet that is recommended by your health care provider. Drink enough fluid to keep your urine pale yellow. If you vomit: Drink water, juice, or soup when you can drink without vomiting. Make sure you have little or no nausea before eating solid foods. General instructions Have a responsible adult stay with you for the time you are told. It is important to have someone help care for you until you are awake and alert. Take over-the-counter and prescription medicines only as told by your health care provider. If you have sleep apnea, surgery and certain medicines can increase your risk for breathing problems. Follow instructions from your health care provider about wearing your sleep device: Anytime you are sleeping, including during daytime naps. While taking prescription pain medicines, sleeping medicines, or medicines that make you drowsy. Avoid smoking. Keep all follow-up visits as told by your health care provider. This is important. Contact a health care provider if: You keep feeling nauseous or you keep vomiting. You feel light-headed. You are still sleepy or having trouble with balance after 24 hours. You develop a rash. You have a fever. You have redness or swelling around the IV site. Get help right away if: You have trouble breathing. You have new-onset confusion at home. Summary For several hours after your procedure, you may feel tired. You may also be forgetful and have poor judgment. Have a responsible adult stay with you for the time you are told. It is important to have someone help care for you until you are awake and alert. Rest as told. Do not drive or operate machinery. Do not drink alcohol or take sleeping pills. Get help right away if you have trouble breathing, or if you suddenly become confused. This information is not intended to replace advice given to you by your health care provider. Make sure you discuss any questions you  have with your healthcare provider. Document Revised: 03/10/2020 Document Reviewed: 05/28/2019 Elsevier Patient Education  2022 [employer].

## 2021-01-10 NOTE — Procedures (Signed)
Interventional Radiology Procedure Note  Procedure: US guided biopsy of liver mass, left liver Complications: None EBL: None Recommendations: - Bedrest 2 hours.   - Routine wound care - Follow up pathology - Advance diet   Signed,  Corrie Mckusick, DO

## 2021-01-10 NOTE — Consult Note (Signed)
Chief Complaint: Patient was seen in consultation today for image guided liver lesion biopsy and possible paracentesis  Referring Physician(s): Gorsuch,Ni  Supervising Physician: Corrie Mckusick  Patient Status: [facility] - Out-pt  History of Present Illness: Henry Johns is a 60 y.o. male with past medical history of diabetes, hypertension and recent abdominal/back pain along with intermittent nausea, diminished appetite and weight loss.  Subsequent imaging has revealed extensive hepatic metastatic disease and retroperitoneal adenopathy with several small lung nodules. He has no prior history of malignancy.  He presents today for image guided liver lesion biopsy for further evaluation.  Past Medical History:  Diagnosis Date   Diabetes mellitus without complication ([place])    Hypertension     No past surgical history on file.  Allergies: Patient has no known allergies.  Medications: Prior to Admission medications   Medication Sig Start Date End Date Taking? Authorizing Provider  acetaminophen (TYLENOL) 500 MG tablet Take 1,000 mg by mouth every 6 (six) hours as needed for moderate pain.   Yes [provider]  atenolol (TENORMIN) 50 MG tablet Take 50 mg by mouth daily. 11/07/20  Yes [provider]  ibuprofen (ADVIL) 200 MG tablet Take 400 mg by mouth every 6 (six) hours as needed for headache or moderate pain.   Yes [provider]  insulin NPH-regular Human (70-30) 100 UNIT/ML injection Inject 10-12 Units into the skin 2 (two) times daily.   Yes [provider]  morphine (MSIR) 15 MG tablet Take 1 tablet (15 mg total) by mouth every 4 (four) hours as needed for severe pain. 01/04/21  Yes Heath Lark, MD  Multiple Vitamin (MULTIVITAMIN WITH MINERALS) TABS tablet Take 1 tablet by mouth daily.   Yes [provider]  ondansetron (ZOFRAN ODT) 4 MG disintegrating tablet Take 1 tablet (4 mg total) by mouth every 8 (eight) hours as needed for  nausea or vomiting. 12/26/20  Yes Sherwood Gambler, MD  polyethylene glycol (MIRALAX) 17 g packet Take 17 g by mouth daily. 01/04/21  Yes Heath Lark, MD  prochlorperazine (COMPAZINE) 10 MG tablet Take 1 tablet (10 mg total) by mouth every 6 (six) hours as needed for nausea or vomiting. 01/04/21  Yes Gorsuch, Ni, MD  polyethylene glycol-electrolytes (TRILYTE) 420 g solution Take 4,000 mLs by mouth as directed. 01/05/21   Harvel Quale, MD     Family History  Problem Relation Age of Onset   Cancer Father 21       prostate ca    Social History   Socioeconomic History   Marital status: Married    Spouse name: Not on file   Number of children: Not on file   Years of education: Not on file   Highest education level: Not on file  Occupational History   Not on file  Tobacco Use   Smoking status: Never   Smokeless tobacco: Never  Vaping Use   Vaping Use: Never used  Substance and Sexual Activity   Alcohol use: Not Currently   Drug use: Not Currently   Sexual activity: Not Currently  Other Topics Concern   Not on file  Social History Narrative   Married, no childer   Social Determinants of Health   Financial Resource Strain: Not on file  Food Insecurity: Not on file  Transportation Needs: No Transportation Needs   Lack of Transportation (Medical): No   Lack of Transportation (Non-Medical): No  Physical Activity: Inactive   Days of Exercise per Week: 0 days  Minutes of Exercise per Session: 0 min  Stress: Not on file  Social Connections: Not on file      Review of Systems see above; denies fever, headache, chest pain, dyspnea, cough, vomiting; he has had some reported hemorrhoidal bleeding  Vital Signs: BP 114/71   Pulse 61   Temp 98.2 F (36.8 C) (Oral)   Ht 5\' 8"  (1.727 m)   Wt 185 lb (83.9 kg)   SpO2 97%   BMI 28.13 kg/m   Physical Exam awake, alert.  Slightly jaundiced; chest clear to auscultation bilaterally.  Heart with regular rate and rhythm.   Abdomen soft, positive bowel sounds, currently nontender, some soft tissue fullness right upper quadrant?  Liver edge.  No lower extremity edema.  Imaging: CT Chest Wo Contrast  Result Date: 12/26/2020 CLINICAL DATA:  Abdominal pain, nausea vomiting. Follow-up to an abnormal abdomen and pelvis CT performed earlier today to assess for malignancy of unknown primary. EXAM: CT CHEST WITHOUT CONTRAST TECHNIQUE: Multidetector CT imaging of the chest was performed following the standard protocol without IV contrast. COMPARISON:  Current abdomen and pelvis CT. FINDINGS: Cardiovascular: Heart is normal in size and configuration. No pericardial effusion mild left coronary artery calcifications. Great vessels are normal in caliber. No aortic or arch branch vessel atherosclerotic calcifications. Mediastinum/Nodes: Visualized thyroid is unremarkable. No neck base, axillary, mediastinal or hilar masses or enlarged lymph nodes. Trachea and esophagus are unremarkable. Lungs/Pleura: Several lung nodules. Largest nodule, peripheral right lower lobe, image 96, series 4, 7 mm. Additional nodules: 4 mm nodule, left upper lobe, image 33. 3-4 mm nodule, right upper lobe, image 66. 6 mm nodule, right middle lobe abutting the minor fissure, image 70. Two small calcified granuloma in the right middle lobe. Minor linear atelectasis at the posterior lung bases. Remainder of the lungs is clear. No pleural effusion or pneumothorax. Upper Abdomen: No change compared to abdomen and pelvis CT obtained earlier today. Musculoskeletal: No fracture or acute finding. No osteoblastic or osteolytic lesions. IMPRESSION: 1. Several small lung nodules consistent with metastatic disease. The primary malignancy remains unclear. The liver morphology suggests underlying cirrhosis, and multifocal hepatocellular carcinoma should be included in the differential diagnosis. 2. No acute abnormality in the chest. Electronically Signed   By: Lajean Manes M.D.    On: 12/26/2020 21:26   CT ABDOMEN PELVIS W CONTRAST  Result Date: 12/26/2020 CLINICAL DATA:  60 year old male with abdominal pain. EXAM: CT ABDOMEN AND PELVIS WITH CONTRAST TECHNIQUE: Multidetector CT imaging of the abdomen and pelvis was performed using the standard protocol following bolus administration of intravenous contrast. CONTRAST:  111mL OMNIPAQUE IOHEXOL 300 MG/ML  SOLN COMPARISON:  None. FINDINGS: Lower chest: There is a 7 mm nodule in the right lung base, likely metastatic disease. Chest CT is recommended for complete evaluation of the lungs. No intra-abdominal free air. Small ascites. Hepatobiliary: Multiple (greater than 20) hepatic hypodense lesions measuring up to 2.9 x 3.4 cm in the left lobe of the liver consistent with metastatic disease. No intrahepatic biliary ductal dilatation. The gallbladder is unremarkable. Pancreas: Unremarkable. No pancreatic ductal dilatation or surrounding inflammatory changes. Spleen: Normal in size without focal abnormality. Adrenals/Urinary Tract: The adrenal glands unremarkable. Subcentimeter right renal interpolar hypodense lesion is not characterized, likely a cyst. There is no hydronephrosis on either side. There is symmetric enhancement and excretion of contrast by both kidneys. The visualized ureters and urinary bladder appear unremarkable. Stomach/Bowel: There is no bowel obstruction or active inflammation. The appendix is normal. Vascular/Lymphatic: Mild aortoiliac  atherosclerotic disease. The IVC is unremarkable. No portal venous gas. Retroperitoneal/aortocaval adenopathy measure up to 16 mm in short axis. There is mild nodularity of the omentum in the right hemiabdomen (42/2). Reproductive: The prostate and seminal vesicles are grossly unremarkable. Other: None Musculoskeletal: Osteopenia with degenerative changes of the spine. No acute osseous pathology. IMPRESSION: 1. Extensive hepatic metastatic disease and retroperitoneal adenopathy. 2. A 7 mm right  lung base nodule, most consistent with metastatic disease. 3. No bowel obstruction. Normal appendix. 4. Aortic Atherosclerosis (ICD10-I70.0). Electronically Signed   By: Anner Crete M.D.   On: 12/26/2020 20:45    Labs:  CBC: Recent Labs    12/26/20 1834  WBC 16.3*  HGB 11.7*  HCT 35.0*  PLT 327    COAGS: No results for input(s): INR, APTT in the last 8760 hours.  BMP: Recent Labs    12/26/20 1834  NA 134*  K 4.4  CL 94*  CO2 28  GLUCOSE 153*  BUN 11  CALCIUM 9.4  CREATININE 0.75  GFRNONAA >60    LIVER FUNCTION TESTS: Recent Labs    12/26/20 1834  BILITOT 2.5*  AST 87*  ALT 47*  ALKPHOS 543*  PROT 6.9  ALBUMIN 2.8*    TUMOR MARKERS: No results for input(s): AFPTM, CEA, CA199, CHROMGRNA in the last 8760 hours.  Assessment and Plan: 60 y.o. male with past medical history of diabetes, hypertension and recent abdominal/back pain along with intermittent nausea, diminished appetite and weight loss.  Subsequent imaging has revealed extensive hepatic metastatic disease and retroperitoneal adenopathy with several small lung nodules. He has no prior history of malignancy.  He presents today for image guided liver lesion biopsy for further evaluation.Risks and benefits of procedure was discussed with the patient including, but not limited to bleeding, infection, damage to adjacent structures or low yield requiring additional tests.  All of the questions were answered and there is agreement to proceed.  Consent signed and in chart.    Thank you for this interesting consult.  I greatly enjoyed meeting Henry Johns and look forward to participating in their care.  A copy of this report was sent to the requesting provider on this date.  Electronically Signed: D. Rowe Robert, PA-C 01/10/2021, 11:49 AM   I spent a total of  25 minutes   in face to face in clinical consultation, greater than 50% of which was counseling/coordinating care for image guided liver  lesion biopsy

## 2021-01-11 ENCOUNTER — Encounter (HOSPITAL_COMMUNITY)
Admission: RE | Admit: 2021-01-11 | Discharge: 2021-01-11 | Disposition: A | Discharge status: Home / Self Care | Payer: Medicaid Other | Source: Ambulatory Visit | Attending: Gastroenterology | Admitting: Gastroenterology

## 2021-01-11 ENCOUNTER — Other Ambulatory Visit: Payer: Self-pay

## 2021-01-11 ENCOUNTER — Encounter (HOSPITAL_COMMUNITY): Payer: Self-pay

## 2021-01-11 DIAGNOSIS — I4510 Unspecified right bundle-branch block: Secondary | ICD-10-CM | POA: Insufficient documentation

## 2021-01-11 DIAGNOSIS — Z01810 Encounter for preprocedural cardiovascular examination: Secondary | ICD-10-CM | POA: Diagnosis not present

## 2021-01-13 ENCOUNTER — Encounter: Payer: Self-pay | Admitting: Hematology and Oncology

## 2021-01-13 ENCOUNTER — Encounter (HOSPITAL_COMMUNITY): Payer: Self-pay | Admitting: Gastroenterology

## 2021-01-13 ENCOUNTER — Ambulatory Visit (HOSPITAL_COMMUNITY): Payer: Medicaid Other | Admitting: Anesthesiology

## 2021-01-13 ENCOUNTER — Encounter (HOSPITAL_COMMUNITY)
Admission: RE | Disposition: A | Discharge status: Home / Self Care | Payer: Self-pay | Source: Home / Self Care | Attending: Gastroenterology

## 2021-01-13 ENCOUNTER — Ambulatory Visit (HOSPITAL_COMMUNITY)
Admission: RE | Admit: 2021-01-13 | Discharge: 2021-01-13 | Disposition: A | Discharge status: Home / Self Care | Payer: Medicaid Other | Attending: Gastroenterology | Admitting: Gastroenterology

## 2021-01-13 ENCOUNTER — Other Ambulatory Visit: Payer: Self-pay | Admitting: Hematology and Oncology

## 2021-01-13 DIAGNOSIS — C189 Malignant neoplasm of colon, unspecified: Secondary | ICD-10-CM

## 2021-01-13 DIAGNOSIS — C187 Malignant neoplasm of sigmoid colon: Secondary | ICD-10-CM | POA: Insufficient documentation

## 2021-01-13 DIAGNOSIS — K2980 Duodenitis without bleeding: Secondary | ICD-10-CM | POA: Insufficient documentation

## 2021-01-13 DIAGNOSIS — I10 Essential (primary) hypertension: Secondary | ICD-10-CM | POA: Insufficient documentation

## 2021-01-13 DIAGNOSIS — Z79899 Other long term (current) drug therapy: Secondary | ICD-10-CM | POA: Insufficient documentation

## 2021-01-13 DIAGNOSIS — C184 Malignant neoplasm of transverse colon: Secondary | ICD-10-CM | POA: Diagnosis not present

## 2021-01-13 DIAGNOSIS — Z794 Long term (current) use of insulin: Secondary | ICD-10-CM | POA: Insufficient documentation

## 2021-01-13 DIAGNOSIS — K3189 Other diseases of stomach and duodenum: Secondary | ICD-10-CM | POA: Insufficient documentation

## 2021-01-13 DIAGNOSIS — K5669 Other partial intestinal obstruction: Secondary | ICD-10-CM | POA: Insufficient documentation

## 2021-01-13 DIAGNOSIS — E119 Type 2 diabetes mellitus without complications: Secondary | ICD-10-CM | POA: Insufficient documentation

## 2021-01-13 DIAGNOSIS — D123 Benign neoplasm of transverse colon: Secondary | ICD-10-CM

## 2021-01-13 DIAGNOSIS — C787 Secondary malignant neoplasm of liver and intrahepatic bile duct: Secondary | ICD-10-CM | POA: Insufficient documentation

## 2021-01-13 DIAGNOSIS — R590 Localized enlarged lymph nodes: Secondary | ICD-10-CM | POA: Insufficient documentation

## 2021-01-13 HISTORY — PX: POLYPECTOMY: SHX5525

## 2021-01-13 HISTORY — PX: ESOPHAGOGASTRODUODENOSCOPY (EGD) WITH PROPOFOL: SHX5813

## 2021-01-13 HISTORY — PX: COLONOSCOPY WITH PROPOFOL: SHX5780

## 2021-01-13 HISTORY — PX: BIOPSY: SHX5522

## 2021-01-13 LAB — GLUCOSE, CAPILLARY: Glucose-Capillary: 108 mg/dL — ABNORMAL HIGH (ref 70–99)

## 2021-01-13 SURGERY — COLONOSCOPY WITH PROPOFOL
Anesthesia: General

## 2021-01-13 MED ORDER — LACTATED RINGERS IV SOLN: INTRAVENOUS | Status: DC

## 2021-01-13 MED ORDER — SPOT INK MARKER SYRINGE KIT
PACK | SUBMUCOSAL | Status: DC | PRN
  Administered 2021-01-13: 2 mL via SUBMUCOSAL

## 2021-01-13 MED ORDER — PROPOFOL 500 MG/50ML IV EMUL
INTRAVENOUS | Status: DC | PRN
  Administered 2021-01-13: 150 ug/kg/min via INTRAVENOUS

## 2021-01-13 MED ORDER — PHENYLEPHRINE HCL (PRESSORS) 10 MG/ML IV SOLN
INTRAVENOUS | Status: DC | PRN
  Administered 2021-01-13 (×10): 80 ug via INTRAVENOUS

## 2021-01-13 MED ORDER — PROPOFOL 10 MG/ML IV BOLUS
INTRAVENOUS | Status: DC | PRN
  Administered 2021-01-13: 100 mg via INTRAVENOUS
  Administered 2021-01-13: 30 mg via INTRAVENOUS

## 2021-01-13 MED ORDER — EPHEDRINE SULFATE 50 MG/ML IJ SOLN
INTRAMUSCULAR | Status: DC | PRN
  Administered 2021-01-13: 10 mg via INTRAVENOUS
  Administered 2021-01-13: 5 mg via INTRAVENOUS
  Administered 2021-01-13 (×2): 10 mg via INTRAVENOUS

## 2021-01-13 MED ORDER — LIDOCAINE HCL (CARDIAC) PF 100 MG/5ML IV SOSY
PREFILLED_SYRINGE | INTRAVENOUS | Status: DC | PRN
  Administered 2021-01-13: 50 mg via INTRAVENOUS

## 2021-01-13 MED ORDER — STERILE WATER FOR IRRIGATION IR SOLN
Status: DC | PRN
  Administered 2021-01-13: 1.5 mL

## 2021-01-13 NOTE — H&P (Signed)
Henry Johns is an 60 y.o. male.   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  The patient was seen by the ER initially after he presented worsening abdominal pain.  He underwent cross-sectional abdominal imaging in his chest and abdomen that confirmed metastatic cancer.  The patient was seen by the oncology team who referred him to my office.  He had presented some episodes of rectal bleeding in the past and some constipation worsening abdominal pain in his lower abdomen.  He was able to improve his bowel movements with laxatives but he has presented some occasional lower abdominal pain.  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  Past Medical History:  Diagnosis Date   Diabetes mellitus without complication ([place])    Hypertension     Past Surgical History:  Procedure Laterality Date   CATARACT EXTRACTION Right    LIVER BIOPSY  01/10/2021   U/S guided    Family History  Problem Relation Age of Onset   Cancer Father 25       prostate ca   Social History:  reports that he has never smoked. He has never used smokeless tobacco. He reports previous alcohol use. He reports previous drug use.  Allergies: No Known Allergies  Medications Prior to Admission  Medication Sig Dispense Refill   acetaminophen (TYLENOL) 500 MG tablet Take 1,000 mg by mouth every 6 (six) hours as needed for moderate pain.     atenolol (TENORMIN) 50 MG tablet Take 50 mg by mouth daily.     ibuprofen (ADVIL) 200 MG tablet Take 400 mg by mouth every 6 (six) hours as needed for headache or moderate pain.     insulin NPH-regular Human (70-30) 100 UNIT/ML injection Inject 10-12  Units into the skin 2 (two) times daily.     morphine (MSIR) 15 MG tablet Take 1 tablet (15 mg total) by mouth every 4 (four) hours as needed for severe pain. 30 tablet 0   polyethylene glycol (MIRALAX) 17 g packet Take 17 g by mouth daily. 30 each 3   polyethylene glycol-electrolytes (TRILYTE) 420 g solution Take 4,000 mLs by mouth as directed. 4000 mL 0   prochlorperazine (COMPAZINE) 10 MG tablet Take 1 tablet (10 mg total) by mouth every 6 (six) hours as needed for nausea or vomiting. 30 tablet 3   Multiple Vitamin (MULTIVITAMIN WITH MINERALS) TABS tablet Take 1 tablet by mouth daily.     ondansetron (ZOFRAN ODT) 4 MG disintegrating tablet Take 1 tablet (4 mg total) by mouth every 8 (eight) hours as needed for nausea or vomiting. 10 tablet 0    Results for orders placed or performed during the hospital encounter of 01/13/21 (from the past 48 hour(s))  Glucose, capillary     Status: Abnormal   Collection Time: 01/13/21  9:44 AM  Result Value Ref Range   Glucose-Capillary 108 (H) 70 - 99 mg/dL    Comment: Glucose reference range applies only to samples taken after fasting for at least 8 hours.   No results found.  Review of Systems  Constitutional: Negative.   HENT: Negative.    Eyes: Negative.   Respiratory: Negative.    Cardiovascular: Negative.   Gastrointestinal:  Positive for abdominal  pain and constipation.  Endocrine: Negative.   Genitourinary: Negative.   Musculoskeletal: Negative.   Skin: Negative.   Allergic/Immunologic: Negative.   Neurological: Negative.   Hematological: Negative.   Psychiatric/Behavioral: Negative.     Blood pressure 112/70, pulse 74, temperature 98.3 F (36.8 C), temperature source Oral, resp. rate 17, SpO2 99 %. Physical Exam  GENERAL: The patient is AO x3, in no acute distress. HEENT: Head is normocephalic and atraumatic. EOMI are intact. Mouth is well hydrated and without lesions. NECK: Supple. No masses LUNGS: Clear to auscultation. No  presence of rhonchi/wheezing/rales. Adequate chest expansion HEART: RRR, normal s1 and s2. ABDOMEN: Soft, nontender, no guarding, no peritoneal signs, and nondistended. BS +. Presence of a umbilical hernia. EXTREMITIES: Without any cyanosis, clubbing, rash, lesions or edema. NEUROLOGIC: AOx3, no focal motor deficit. SKIN: no jaundice, no rashes  Assessment/Plan 60 year old male with past medical history of Hypertension and diabetes, who was recently  found to have extensive metastatic lesions in his liver, as well as retroperitoneal adenopathy, and 7 mm right lung base nodule.  We will proceed with EGD and colonoscopy to evaluate for source of metastatic cancer.  Harvel Quale, MD 01/13/2021, 10:10 AM

## 2021-01-13 NOTE — Transfer of Care (Signed)
Immediate Anesthesia Transfer of Care Note  Patient: Henry Johns  Procedure(s) Performed: COLONOSCOPY WITH PROPOFOL ESOPHAGOGASTRODUODENOSCOPY (EGD) WITH PROPOFOL BIOPSY POLYPECTOMY  Patient Location: Short Stay  Anesthesia Type:General  Level of Consciousness: drowsy  Airway & Oxygen Therapy: Patient Spontanous Breathing  Post-op Assessment: Report given to RN and Post -op Vital signs reviewed and stable  Post vital signs: Reviewed and stable  Last Vitals:  Vitals Value Taken Time  BP    Temp    Pulse    Resp    SpO2      Last Pain:  Vitals:   01/13/21 1011  TempSrc:   PainSc: 0-No pain         Complications: No notable events documented.

## 2021-01-13 NOTE — Op Note (Addendum)
[facility] Patient Name: Henry Johns Procedure Date: 01/13/2021 9:59 AM MRN: [id number] Date of Birth: [birth year] Attending MD: Maylon Peppers ,  CSN: [id number] Age: 60 Admit Type: Outpatient Procedure:                Upper GI endoscopy Indications:              Metastatic malignancy to liver Providers:                Maylon Peppers, Caprice Kluver, Raphael Gibney,                            Technician Referring MD:              Medicines:                Monitored Anesthesia Care Complications:            No immediate complications. Estimated Blood Loss:     Estimated blood loss: none. Procedure:                Pre-Anesthesia Assessment:                           - Prior to the procedure, a History and Physical                            was performed, and patient medications, allergies                            and sensitivities were reviewed. The patient's                            tolerance of previous anesthesia was reviewed.                           - The risks and benefits of the procedure and the                            sedation options and risks were discussed with the                            patient. All questions were answered and informed                            consent was obtained.                           - ASA Grade Assessment: III - A patient with severe                            systemic disease.                           After obtaining informed consent, the endoscope was                            passed under direct vision. Throughout the  procedure, the patient's blood pressure, pulse, and                            oxygen saturations were monitored continuously. The                            GIF-H190 ([id number]) scope was introduced through the                            mouth, and advanced to the third part of duodenum.                            The upper GI endoscopy was accomplished without                             difficulty. The patient tolerated the procedure                            well. Scope In: 10:17:08 AM Scope Out: 10:23:50 AM Total Procedure Duration: 0 hours 6 minutes 42 seconds  Findings:      The examined esophagus was normal.      The Z-line was regular and was found 40 cm from the incisors.      The entire examined stomach was normal.      Localized nodular mucosa was found in the duodenal bulb. Biopsies were       taken with a cold forceps for histology.      The exam of the duodenum was otherwise normal. Impression:               - Normal esophagus.                           - Z-line regular, 40 cm from the incisors.                           - Normal stomach.                           - Nodular mucosa in the duodenal bulb. Biopsied. Moderate Sedation:      Per Anesthesia Care Recommendation:           - Discharge patient to home (ambulatory).                           - Resume previous diet.                           - Await pathology results. Procedure Code(s):        --- Professional ---                           [phone], Esophagogastroduodenoscopy, flexible,                            transoral; with biopsy, single or multiple Diagnosis Code(s):        --- Professional ---  K31.89, Other diseases of stomach and duodenum                           C78.7, Secondary malignant neoplasm of liver and                            intrahepatic bile duct CPT copyright 2019 American Medical Association. All rights reserved. The codes documented in this report are preliminary and upon coder review may  be revised to meet current compliance requirements. Maylon Peppers, MD Maylon Peppers,  01/13/2021 10:27:37 AM This report has been signed electronically. Number of Addenda: 0

## 2021-01-13 NOTE — Anesthesia Preprocedure Evaluation (Signed)
Anesthesia Evaluation  Patient identified by MRN, date of birth, ID band Patient awake    Reviewed: Allergy & Precautions, NPO status , Patient's Chart, lab work & pertinent test results, reviewed documented beta blocker date and time   History of Anesthesia Complications Negative for: history of anesthetic complications  Airway Mallampati: II  TM Distance: >3 FB Neck ROM: Full    Dental  (+) Dental Advisory Given, Missing   Pulmonary  Metastasis to lungs   breath sounds clear to auscultation       Cardiovascular Exercise Tolerance: Good hypertension, Pt. on medications and Pt. on home beta blockers  Rhythm:Regular Rate:Normal     Neuro/Psych  Neuromuscular disease negative psych ROS   GI/Hepatic negative GI ROS, (+) Cirrhosis  (metastatic liver disease)      ,   Endo/Other  diabetes, Well Controlled, Type 2, Oral Hypoglycemic Agents  Renal/GU negative Renal ROS     Musculoskeletal negative musculoskeletal ROS (+)   Abdominal   Peds  Hematology negative hematology ROS (+)   Anesthesia Other Findings   Reproductive/Obstetrics negative OB ROS                             Anesthesia Physical Anesthesia Plan  ASA: 4  Anesthesia Plan: General   Post-op Pain Management:    Induction: Intravenous  PONV Risk Score and Plan: Propofol infusion  Airway Management Planned: Nasal Cannula and Natural Airway  Additional Equipment:   Intra-op Plan:   Post-operative Plan: Possible Post-op intubation/ventilation  Informed Consent: I have reviewed the patients History and Physical, chart, labs and discussed the procedure including the risks, benefits and alternatives for the proposed anesthesia with the patient or authorized representative who has indicated his/her understanding and acceptance.       Plan Discussed with: CRNA and Surgeon  Anesthesia Plan Comments:          Anesthesia Quick Evaluation

## 2021-01-13 NOTE — Op Note (Signed)
[facility] Patient Name: Henry Johns Procedure Date: 01/13/2021 10:27 AM MRN: [id number] Date of Birth: 1960-12-24 Attending MD: Maylon Peppers ,  CSN: [id number] Age: 60 Admit Type: Outpatient Procedure:                Colonoscopy Indications:              Metastatic malignancy to liver Providers:                Maylon Peppers, Caprice Kluver, Raphael Gibney,                            Technician Referring MD:             Heath Lark Medicines:                Monitored Anesthesia Care Complications:            No immediate complications. Estimated Blood Loss:     Estimated blood loss: none. Procedure:                Pre-Anesthesia Assessment:                           - Prior to the procedure, a History and Physical                            was performed, and patient medications, allergies                            and sensitivities were reviewed. The patient's                            tolerance of previous anesthesia was reviewed.                           - The risks and benefits of the procedure and the                            sedation options and risks were discussed with the                            patient. All questions were answered and informed                            consent was obtained.                           - ASA Grade Assessment: III - A patient with severe                            systemic disease.                           After obtaining informed consent, the colonoscope                            was passed under direct vision. Throughout the  procedure, the patient's blood pressure, pulse, and                            oxygen saturations were monitored continuously. The                            PCF-H190DL ([id number]) scope was introduced through                            the anus and advanced to the the sigmoid colon to                            examine a mass. This was the intended extent. The                             PCF-PH190L ([id number]) scope was introduced through                            the anus and advanced to the the cecum, identified                            by appendiceal orifice and ileocecal valve. The                            colonoscopy was performed without difficulty. The                            patient tolerated the procedure well. Scope In: 10:29:24 AM Scope Out: 11:06:20 AM Scope Withdrawal Time: 0 hours 22 minutes 52 seconds  Total Procedure Duration: 0 hours 36 minutes 56 seconds  Findings:      The perianal and digital rectal examinations were normal.      Two sessile polyps were found in the transverse colon. The polyps were 3       to 5 mm in size. These polyps were removed with a cold snare. Resection       and retrieval were complete.      A frond-like/villous and ulcerated partially obstructing large mass was       found at 17 cm proximal to the anus. The mass was circumferential. The       regular pediatric scope could not The mass measured five cm in length.       No bleeding was present. This was biopsied with a cold forceps for       histology. Two areas proximal and distal to the area were tattooed with       an injection of 4 mL of [place] ink.      The retroflexed view of the distal rectum and anal verge was normal and       showed no anal or rectal abnormalities. Impression:               - Two 3 to 5 mm polyps in the transverse colon,                            removed with a cold snare. Resected and retrieved.                           -  Malignant partially obstructing tumor at 17 cm                            proximal to the anus. Biopsied. Tattooed.                           - The distal rectum and anal verge are normal on                            retroflexion view. Moderate Sedation:      Per Anesthesia Care Recommendation:           - Discharge patient to home (ambulatory).                           - High fiber diet.                            - Await pathology results.                           - Repeat colonoscopy for surveillance based on                            pathology results.                           - Check CEA titer.                           - Follow up with Dr. Heath Lark (oncology)                            regarding treatment options for colon                            adenocarcinoma.                           - Continue with daily Miralax, needs tos have loose                            to watery BMs daily. Procedure Code(s):        --- Professional ---                           [phone], Colonoscopy, flexible; with removal of                            tumor(s), polyp(s), or other lesion(s) by snare                            technique                           01410, Colonoscopy, flexible; with directed  submucosal injection(s), any substance                           45380, 39, Colonoscopy, flexible; with biopsy,                            single or multiple Diagnosis Code(s):        --- Professional ---                           K63.5, Polyp of colon                           C18.9, Malignant neoplasm of colon, unspecified                           K56.690, Other partial intestinal obstruction                           C78.7, Secondary malignant neoplasm of liver and                            intrahepatic bile duct CPT copyright 2019 American Medical Association. All rights reserved. The codes documented in this report are preliminary and upon coder review may  be revised to meet current compliance requirements. Maylon Peppers, MD Maylon Peppers,  01/13/2021 11:22:31 AM This report has been signed electronically. Number of Addenda: 0

## 2021-01-13 NOTE — Anesthesia Postprocedure Evaluation (Signed)
Anesthesia Post Note  Patient: CHRISTIPHER RIEGER  Procedure(s) Performed: COLONOSCOPY WITH PROPOFOL ESOPHAGOGASTRODUODENOSCOPY (EGD) WITH PROPOFOL BIOPSY POLYPECTOMY  Patient location during evaluation: PACU Anesthesia Type: General Level of consciousness: awake and alert and oriented Pain management: pain level controlled Vital Signs Assessment: post-procedure vital signs reviewed and stable Respiratory status: spontaneous breathing and respiratory function stable Cardiovascular status: blood pressure returned to baseline and stable Postop Assessment: no apparent nausea or vomiting Anesthetic complications: no   No notable events documented.   Last Vitals:  Vitals:   01/13/21 1116 01/13/21 1132  BP: (!) 85/58 116/74  Pulse: 77   Resp: 14   Temp: 36.7 C   SpO2: 99%     Last Pain:  Vitals:   01/13/21 1116  TempSrc: Oral  PainSc: Asleep                 Yaritzi Craun C Doris Gruhn

## 2021-01-13 NOTE — Discharge Instructions (Addendum)
You are being discharged to home.  Eat a high fiber diet.  We are waiting for your pathology results.  Your physician has recommended a repeat colonoscopy for surveillance based on pathology results.  Follow up with Dr. Heath Lark (oncology) regarding treatment options for colon adenocarcinoma. Continue with daily Miralax, needs tos have loose to watery BMs daily.

## 2021-01-14 LAB — CEA: CEA: 883 ng/mL — ABNORMAL HIGH (ref 0.0–4.7)

## 2021-01-16 ENCOUNTER — Telehealth (INDEPENDENT_AMBULATORY_CARE_PROVIDER_SITE_OTHER): Payer: Self-pay

## 2021-01-16 ENCOUNTER — Other Ambulatory Visit: Payer: Self-pay

## 2021-01-16 ENCOUNTER — Other Ambulatory Visit (INDEPENDENT_AMBULATORY_CARE_PROVIDER_SITE_OTHER): Payer: Self-pay | Admitting: Gastroenterology

## 2021-01-16 ENCOUNTER — Other Ambulatory Visit: Payer: Self-pay | Admitting: Hematology and Oncology

## 2021-01-16 DIAGNOSIS — K2980 Duodenitis without bleeding: Secondary | ICD-10-CM

## 2021-01-16 MED ORDER — OMEPRAZOLE 40 MG PO CPDR: 40.0000 mg | DELAYED_RELEASE_CAPSULE | Freq: Every day | ORAL | 3 refills | Status: DC

## 2021-01-16 NOTE — Telephone Encounter (Signed)
Omeprazole was sent to pharmacy

## 2021-01-16 NOTE — Telephone Encounter (Signed)
Patient aware.

## 2021-01-16 NOTE — Telephone Encounter (Signed)
Patient states they spoke with Dr.Castaneda and he was going to send in a medication for inflammation. They have called the pharmacy and it is not there. Please advise.

## 2021-01-18 ENCOUNTER — Other Ambulatory Visit: Payer: Self-pay

## 2021-01-18 ENCOUNTER — Other Ambulatory Visit (HOSPITAL_COMMUNITY): Payer: Self-pay

## 2021-01-18 ENCOUNTER — Encounter (HOSPITAL_COMMUNITY): Payer: Self-pay | Admitting: Hematology and Oncology

## 2021-01-18 ENCOUNTER — Inpatient Hospital Stay (HOSPITAL_COMMUNITY): Payer: Medicaid Other | Attending: Hematology | Admitting: Hematology and Oncology

## 2021-01-18 VITALS — BP 134/76 | HR 94 | Temp 97.0°F | Resp 18 | Wt 176.9 lb

## 2021-01-18 DIAGNOSIS — G893 Neoplasm related pain (acute) (chronic): Secondary | ICD-10-CM | POA: Diagnosis not present

## 2021-01-18 DIAGNOSIS — Z5111 Encounter for antineoplastic chemotherapy: Secondary | ICD-10-CM | POA: Insufficient documentation

## 2021-01-18 DIAGNOSIS — E1140 Type 2 diabetes mellitus with diabetic neuropathy, unspecified: Secondary | ICD-10-CM

## 2021-01-18 DIAGNOSIS — Z79899 Other long term (current) drug therapy: Secondary | ICD-10-CM | POA: Insufficient documentation

## 2021-01-18 DIAGNOSIS — C7800 Secondary malignant neoplasm of unspecified lung: Secondary | ICD-10-CM | POA: Insufficient documentation

## 2021-01-18 DIAGNOSIS — C187 Malignant neoplasm of sigmoid colon: Secondary | ICD-10-CM | POA: Diagnosis not present

## 2021-01-18 DIAGNOSIS — C189 Malignant neoplasm of colon, unspecified: Secondary | ICD-10-CM

## 2021-01-18 DIAGNOSIS — Z794 Long term (current) use of insulin: Secondary | ICD-10-CM

## 2021-01-18 DIAGNOSIS — C787 Secondary malignant neoplasm of liver and intrahepatic bile duct: Secondary | ICD-10-CM

## 2021-01-18 MED ORDER — LIDOCAINE-PRILOCAINE 2.5-2.5 % EX CREA: TOPICAL_CREAM | CUTANEOUS | 3 refills | Status: DC

## 2021-01-18 MED ORDER — MORPHINE SULFATE 15 MG PO TABS: 15.0000 mg | ORAL_TABLET | Freq: Four times a day (QID) | ORAL | 0 refills | Status: DC | PRN

## 2021-01-18 NOTE — Assessment & Plan Note (Signed)
We will repeat his liver function test before starting treatment We will prescribe dose adjustment depending on results of his liver enzymes

## 2021-01-18 NOTE — Assessment & Plan Note (Signed)
I have requested the pathologist to order the MMR testing as well as sending the tissue to get additional molecular studies performed for K-ras, BRAF and NRAS mutation I recommend we proceed with port placement, chemo education class and to get him started on FOLFOX chemotherapy At this point in time, I do not recommend adding bevacizumab due to risk of bowel obstruction/perforation I recommend 2 to 3 months of treatment and then repeat imaging study and if shown to have positive response to treatment and resolution of semibowel obstructive symptoms, we can add bevacizumab down the road  We discussed the risk, benefits, side effects of FOLFOX chemotherapy including risk of allergic reaction, mucositis, diarrhea, pancytopenia, cold intolerance, neuropathy, uncontrolled hyperglycemia, etc. and he is in agreement to proceed He is aware that treatment goal is palliative and not curative I explained to the patient and his brother why surgery or radiation is not indicated We will try to get him started with treatment as soon as possible

## 2021-01-18 NOTE — Assessment & Plan Note (Signed)
He is aware about risk of neuropathy and hyperglycemia while on treatment

## 2021-01-18 NOTE — Progress Notes (Signed)
[facility] OFFICE PROGRESS NOTE  Patient Care Team: Lucia Gaskins, MD as PCP - General (Internal Medicine) Gala Romney, Cristopher Estimable, MD as Consulting Physician (Gastroenterology)  ASSESSMENT & PLAN:  Colon cancer metastasized to liver [facility]) I have requested the pathologist to order the MMR testing as well as sending the tissue to get additional molecular studies performed for K-ras, BRAF and NRAS mutation I recommend we proceed with port placement, chemo education class and to get him started on FOLFOX chemotherapy At this point in time, I do not recommend adding bevacizumab due to risk of bowel obstruction/perforation I recommend 2 to 3 months of treatment and then repeat imaging study and if shown to have positive response to treatment and resolution of semibowel obstructive symptoms, we can add bevacizumab down the road  We discussed the risk, benefits, side effects of FOLFOX chemotherapy including risk of allergic reaction, mucositis, diarrhea, pancytopenia, cold intolerance, neuropathy, uncontrolled hyperglycemia, etc. and he is in agreement to proceed He is aware that treatment goal is palliative and not curative I explained to the patient and his brother why surgery or radiation is not indicated We will try to get him started with treatment as soon as possible  Metastasis to liver [facility]) We will repeat his liver function test before starting treatment We will prescribe dose adjustment depending on results of his liver enzymes  Cancer associated pain His pain is well controlled I refilled his prescription pain medicine We discussed narcotic refill policy  Diabetes mellitus with neuropathy ([place]) He is aware about risk of neuropathy and hyperglycemia while on treatment  Orders Placed This Encounter  Procedures   IR IMAGING GUIDED PORT INSERTION    Standing Status:   Future    Standing Expiration Date:   01/18/2022    Order Specific Question:   Reason for Exam (SYMPTOM   OR DIAGNOSIS REQUIRED)    Answer:   need port for chemo ASAP    Order Specific Question:   Preferred Imaging Location?    Answer:   External   CBC with Differential    Standing Status:   Standing    Number of Occurrences:   20    Standing Expiration Date:   01/18/2022   Comprehensive metabolic panel    Standing Status:   Standing    Number of Occurrences:   20    Standing Expiration Date:   01/18/2022    All questions were answered. The patient knows to call the clinic with any problems, questions or concerns. The total time spent in the appointment was 40 minutes encounter with patients including review of chart and various tests results, discussions about plan of care and coordination of care plan   Heath Lark, MD 01/18/2021 1:33 PM  INTERVAL HISTORY: Please see below for problem oriented charting. He returns with his brother to review test results His pain is well controlled On average, he takes 2 prescribed pain medicine per day He denies nausea or vomiting He is able to regulate his bowels and goes to the bathroom for bowel movement at least once daily  SUMMARY OF ONCOLOGIC HISTORY: Oncology History  Metastasis to liver ([place])  01/04/2021 Initial Diagnosis   Metastasis to liver ([place])    01/25/2021 -  Chemotherapy    Patient is on Treatment Plan: COLORECTAL FOLFOX Q14D       Colon cancer metastasized to liver ([place])  12/26/2020 Imaging   Ct abdomen and pelvis 1. Extensive hepatic metastatic disease and retroperitoneal adenopathy. 2. A  7 mm right lung base nodule, most consistent with metastatic disease. 3. No bowel obstruction. Normal appendix. 4. Aortic Atherosclerosis (ICD10-I70.0).   12/26/2020 Imaging   CT chest 1. Several small lung nodules consistent with metastatic disease. The primary malignancy remains unclear. The liver morphology suggests underlying cirrhosis, and multifocal hepatocellular carcinoma should be included in the differential diagnosis. 2. No  acute abnormality in the chest.     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 LIVER, NEEDLE CORE BIOPSY:  -  Metastatic adenocarcinoma  -  See comment   COMMENT:   The neoplastic cells are positive for cytokeratin 20 and CDX2 but negative for cytokeratin 7, TTF-1, PSA and prostein.  The combined morphology and immunophenotype are consistent with metastasis from a  gastrointestinal primary.  Dr. Saralyn Pilar reviewed the case and agrees with the above diagnosis.     01/13/2021 Procedure   Colonoscopy - Two 3 to 5 mm polyps in the transverse colon, removed with a cold snare. Resected and retrieved. - Malignant partially obstructing tumor at 17 cm proximal to the anus. Biopsied. Tattooed. - The distal rectum and anal verge are normal on retroflexion view.   01/13/2021 Cancer Staging   Staging form: Colon and Rectum, AJCC 8th Edition - Clinical stage from 01/13/2021: Stage IVB (cTX, cN0, pM1b) - Signed by Heath Lark, MD on 01/13/2021  Stage prefix: Initial diagnosis  Total positive nodes: 0    01/13/2021 Procedure   Colonoscopy - Two 3 to 5 mm polyps in the transverse colon, removed with a cold snare. Resected and retrieved. - Malignant partially obstructing tumor at 17 cm proximal to the anus. Biopsied. Tattooed. - The distal rectum and anal verge are normal on retroflexion view.   01/13/2021 Procedure   EGD - Normal esophagus. - Z-line regular, 40 cm from the incisors. - Normal stomach. - Nodular mucosa in   01/13/2021 Pathology Results   A. DUODENUM, NODULE, BIOPSY:  - Peptic duodenitis.  - No dysplasia or malignancy.   B. COLON, SIGMOID, MASS, BIOPSY:  - Invasive adenocarcinoma.   C. COLON, TRANSVERSE, POLYPECTOMY:  - Fragment of ulcerated tissue with adenocarcinoma, see comment.  - Tubular adenoma (X2 fragments).   COMMENT:   B. MMR will be ordered   01/13/2021 Tumor Marker   Patient's tumor was tested for the  following markers: CEA. Results of the tumor marker test revealed 883.   01/25/2021 -  Chemotherapy    Patient is on Treatment Plan: COLORECTAL FOLFOX Q14D         REVIEW OF SYSTEMS:   Constitutional: Denies fevers, chills or abnormal weight loss Eyes: Denies blurriness of vision Ears, nose, mouth, throat, and face: Denies mucositis or sore throat Respiratory: Denies cough, dyspnea or wheezes Cardiovascular: Denies palpitation, chest discomfort or lower extremity swelling Gastrointestinal:  Denies nausea, heartburn or change in bowel habits Skin: Denies abnormal skin rashes Lymphatics: Denies new lymphadenopathy or easy bruising Neurological:Denies numbness, tingling or new weaknesses Behavioral/Psych: Mood is stable, no new changes  All other systems were reviewed with the patient and are negative.  I have reviewed the past medical history, past surgical history, social history and family history with the patient and they are unchanged from previous note.  ALLERGIES:  has No Known Allergies.  MEDICATIONS:  Current Outpatient Medications  Medication Sig Dispense Refill   atenolol (TENORMIN) 50 MG tablet Take 50 mg by mouth daily.     insulin NPH-regular Human (70-30)  100 UNIT/ML injection Inject 10-12 Units into the skin 2 (two) times daily.     Multiple Vitamin (MULTIVITAMIN WITH MINERALS) TABS tablet Take 1 tablet by mouth daily.     omeprazole (PRILOSEC) 40 MG capsule Take 1 capsule (40 mg total) by mouth daily. 90 capsule 3   polyethylene glycol (MIRALAX) 17 g packet Take 17 g by mouth daily. 30 each 3   acetaminophen (TYLENOL) 500 MG tablet Take 1,000 mg by mouth every 6 (six) hours as needed for moderate pain. (Patient not taking: Reported on 01/18/2021)     lidocaine-prilocaine (EMLA) cream Apply to affected area once 30 g 3   morphine (MSIR) 15 MG tablet Take 1 tablet (15 mg total) by mouth every 6 (six) hours as needed for severe pain. 60 tablet 0   ondansetron (ZOFRAN  ODT) 4 MG disintegrating tablet Take 1 tablet (4 mg total) by mouth every 8 (eight) hours as needed for nausea or vomiting. (Patient not taking: Reported on 01/18/2021) 10 tablet 0   prochlorperazine (COMPAZINE) 10 MG tablet Take 1 tablet (10 mg total) by mouth every 6 (six) hours as needed for nausea or vomiting. (Patient not taking: Reported on 01/18/2021) 30 tablet 3   No current facility-administered medications for this visit.    PHYSICAL EXAMINATION: ECOG PERFORMANCE STATUS: 1 - Symptomatic but completely ambulatory  Vitals:   01/18/21 0945  BP: 134/76  Pulse: 94  Resp: 18  Temp: (!) 97 F (36.1 C)  SpO2: 100%   Filed Weights   01/18/21 0945  Weight: 176 lb 14.4 oz (80.2 kg)    GENERAL:alert, no distress and comfortable SKIN: skin color, texture, turgor are normal, no rashes or significant lesions EYES: normal, Conjunctiva are pink and non-injected, sclera clear OROPHARYNX:no exudate, no erythema and lips, buccal mucosa, and tongue normal  NECK: supple, thyroid normal size, non-tender, without nodularity LYMPH:  no palpable lymphadenopathy in the cervical, axillary or inguinal LUNGS: clear to auscultation and percussion with normal breathing effort HEART: regular rate & rhythm and no murmurs and no lower extremity edema ABDOMEN:abdomen soft, non-tender and normal bowel sounds Musculoskeletal:no cyanosis of digits and no clubbing  NEURO: alert & oriented x 3 with fluent speech, no focal motor/sensory deficits  LABORATORY DATA:  I have reviewed the data as listed    Component Value Date/Time   NA 134 (L) 12/26/2020 1834   K 4.4 12/26/2020 1834   CL 94 (L) 12/26/2020 1834   CO2 28 12/26/2020 1834   GLUCOSE 153 (H) 12/26/2020 1834   BUN 11 12/26/2020 1834   CREATININE 0.75 12/26/2020 1834   CALCIUM 9.4 12/26/2020 1834   PROT 6.9 12/26/2020 1834   ALBUMIN 2.8 (L) 12/26/2020 1834   AST 87 (H) 12/26/2020 1834   ALT 47 (H) 12/26/2020 1834   ALKPHOS 543 (H) 12/26/2020  1834   BILITOT 2.5 (H) 12/26/2020 1834   GFRNONAA >60 12/26/2020 1834    No results found for: SPEP, UPEP  Lab Results  Component Value Date   WBC 15.3 (H) 01/10/2021   NEUTROABS 9.3 (H) 12/26/2020   HGB 12.2 (L) 01/10/2021   HCT 36.5 (L) 01/10/2021   MCV 90.8 01/10/2021   PLT 383 01/10/2021      Chemistry      Component Value Date/Time   NA 134 (L) 12/26/2020 1834   K 4.4 12/26/2020 1834   CL 94 (L) 12/26/2020 1834   CO2 28 12/26/2020 1834   BUN 11 12/26/2020 1834   CREATININE 0.75 12/26/2020  1834      Component Value Date/Time   CALCIUM 9.4 12/26/2020 1834   ALKPHOS 543 (H) 12/26/2020 1834   AST 87 (H) 12/26/2020 1834   ALT 47 (H) 12/26/2020 1834   BILITOT 2.5 (H) 12/26/2020 1834       RADIOGRAPHIC STUDIES: I have personally reviewed the radiological images as listed and agreed with the findings in the report. CT Chest Wo Contrast  Result Date: 12/26/2020 CLINICAL DATA:  Abdominal pain, nausea vomiting. Follow-up to an abnormal abdomen and pelvis CT performed earlier today to assess for malignancy of unknown primary. EXAM: CT CHEST WITHOUT CONTRAST TECHNIQUE: Multidetector CT imaging of the chest was performed following the standard protocol without IV contrast. COMPARISON:  Current abdomen and pelvis CT. FINDINGS: Cardiovascular: Heart is normal in size and configuration. No pericardial effusion mild left coronary artery calcifications. Great vessels are normal in caliber. No aortic or arch branch vessel atherosclerotic calcifications. Mediastinum/Nodes: Visualized thyroid is unremarkable. No neck base, axillary, mediastinal or hilar masses or enlarged lymph nodes. Trachea and esophagus are unremarkable. Lungs/Pleura: Several lung nodules. Largest nodule, peripheral right lower lobe, image 96, series 4, 7 mm. Additional nodules: 4 mm nodule, left upper lobe, image 33. 3-4 mm nodule, right upper lobe, image 66. 6 mm nodule, right middle lobe abutting the minor fissure,  image 70. Two small calcified granuloma in the right middle lobe. Minor linear atelectasis at the posterior lung bases. Remainder of the lungs is clear. No pleural effusion or pneumothorax. Upper Abdomen: No change compared to abdomen and pelvis CT obtained earlier today. Musculoskeletal: No fracture or acute finding. No osteoblastic or osteolytic lesions. IMPRESSION: 1. Several small lung nodules consistent with metastatic disease. The primary malignancy remains unclear. The liver morphology suggests underlying cirrhosis, and multifocal hepatocellular carcinoma should be included in the differential diagnosis. 2. No acute abnormality in the chest. Electronically Signed   By: Lajean Manes M.D.   On: 12/26/2020 21:26   CT ABDOMEN PELVIS W CONTRAST  Result Date: 12/26/2020 CLINICAL DATA:  60 year old male with abdominal pain. EXAM: CT ABDOMEN AND PELVIS WITH CONTRAST TECHNIQUE: Multidetector CT imaging of the abdomen and pelvis was performed using the standard protocol following bolus administration of intravenous contrast. CONTRAST:  130m OMNIPAQUE IOHEXOL 300 MG/ML  SOLN COMPARISON:  None. FINDINGS: Lower chest: There is a 7 mm nodule in the right lung base, likely metastatic disease. Chest CT is recommended for complete evaluation of the lungs. No intra-abdominal free air. Small ascites. Hepatobiliary: Multiple (greater than 20) hepatic hypodense lesions measuring up to 2.9 x 3.4 cm in the left lobe of the liver consistent with metastatic disease. No intrahepatic biliary ductal dilatation. The gallbladder is unremarkable. Pancreas: Unremarkable. No pancreatic ductal dilatation or surrounding inflammatory changes. Spleen: Normal in size without focal abnormality. Adrenals/Urinary Tract: The adrenal glands unremarkable. Subcentimeter right renal interpolar hypodense lesion is not characterized, likely a cyst. There is no hydronephrosis on either side. There is symmetric enhancement and excretion of contrast by  both kidneys. The visualized ureters and urinary bladder appear unremarkable. Stomach/Bowel: There is no bowel obstruction or active inflammation. The appendix is normal. Vascular/Lymphatic: Mild aortoiliac atherosclerotic disease. The IVC is unremarkable. No portal venous gas. Retroperitoneal/aortocaval adenopathy measure up to 16 mm in short axis. There is mild nodularity of the omentum in the right hemiabdomen (42/2). Reproductive: The prostate and seminal vesicles are grossly unremarkable. Other: None Musculoskeletal: Osteopenia with degenerative changes of the spine. No acute osseous pathology. IMPRESSION: 1. Extensive hepatic metastatic disease  and retroperitoneal adenopathy. 2. A 7 mm right lung base nodule, most consistent with metastatic disease. 3. No bowel obstruction. Normal appendix. 4. Aortic Atherosclerosis (ICD10-I70.0). Electronically Signed   By: Anner Crete M.D.   On: 12/26/2020 20:45   US BIOPSY (LIVER)  Result Date: 01/10/2021 INDICATION: 60 year old male referred for biopsy of liver lesion EXAM: ULTRASOUND-GUIDED LIVER LESION BIOPSY MEDICATIONS: None. ANESTHESIA/SEDATION: Moderate (conscious) sedation was employed during this procedure. A total of Versed 1.0 mg and Fentanyl 50 mcg was administered intravenously. Moderate Sedation Time: 11 minutes. The patient's level of consciousness and vital signs were monitored continuously by radiology nursing throughout the procedure under my direct supervision. FLUOROSCOPY TIME:  Ultrasound COMPLICATIONS: None PROCEDURE: Informed written consent was obtained from the patient after a thorough discussion of the procedural risks, benefits and alternatives. All questions were addressed. Maximal Sterile Barrier Technique was utilized including caps, mask, sterile gowns, sterile gloves, sterile drape, hand hygiene and skin antiseptic. A timeout was performed prior to the initiation of the procedure. Ultrasound survey of the bilateral liver lobe  performed with images stored and sent to PACs. The sub xiphoid region was prepped with chlorhexidine in a sterile fashion, and a sterile drape was applied covering the operative field. A sterile gown and sterile gloves were used for the procedure. Local anesthesia was provided with 1% Lidocaine. The patient was prepped and draped sterilely and the skin and subcutaneous tissues were generously infiltrated with 1% lidocaine. A 17 gauge introducer needle was then advanced under ultrasound guidance in the subxiphoid location into the left liver lobe, targeting heterogeneously hypoechoic lesion. The stylet was removed, and multiple separate 18 gauge core biopsy were retrieved. Samples were placed into formalin for transportation to the lab. Gel-Foam pledgets were then infused with a small amount of saline for assistance with hemostasis. The needle was removed, and a final ultrasound image was performed. The patient tolerated the procedure well and remained hemodynamically stable throughout. No complications were encountered and no significant blood loss was encounter. IMPRESSION: Status post ultrasound-guided biopsy of left liver mass. Signed, Dulcy Fanny. Dellia Nims, RPVI [facility] Electronically Signed   By: Corrie Mckusick D.O.   On: 01/10/2021 17:01

## 2021-01-18 NOTE — Assessment & Plan Note (Signed)
His pain is well controlled I refilled his prescription pain medicine We discussed narcotic refill policy

## 2021-01-18 NOTE — Progress Notes (Signed)
START ON PATHWAY REGIMEN - Colorectal     A cycle is every 14 days:     Oxaliplatin      Leucovorin      Fluorouracil      Fluorouracil   **Always confirm dose/schedule in your pharmacy ordering system**  Patient Characteristics: Distant Metastases, Nonsurgical Candidate, KRAS/NRAS Mutation Positive/Unknown (BRAF V600 Wild-Type/Unknown), Standard Cytotoxic Therapy, First Line Standard Cytotoxic Therapy, Bevacizumab Ineligible, PS = 0,1 Tumor Location: Colon Therapeutic Status: Distant Metastases Microsatellite/Mismatch Repair Status: Unknown BRAF Mutation Status: Awaiting Test Results KRAS/NRAS Mutation Status: Awaiting Test Results Standard Cytotoxic Line of Therapy: First Line Standard Cytotoxic Therapy ECOG Performance Status: 1 Bevacizumab Eligibility: Ineligible Intent of Therapy: Non-Curative / Palliative Intent, Discussed with Patient 

## 2021-01-19 ENCOUNTER — Encounter (HOSPITAL_COMMUNITY): Payer: Self-pay | Admitting: Gastroenterology

## 2021-01-19 LAB — SURGICAL PATHOLOGY

## 2021-01-20 ENCOUNTER — Telehealth: Payer: Self-pay

## 2021-01-20 NOTE — Telephone Encounter (Signed)
-----   Message from Heath Lark, MD sent at 01/20/2021  1:49 PM EDT ----- Irineo Axon,  Can you call IR here at [facility] or [facility] if we can get his port done sooner? ----- Message ----- From: Brien Mates, RN Sent: 01/20/2021   1:39 PM EDT To: Meyer Cory, MD, #  Hi Dr. Alvy Bimler,  We have requested port-a-cath placement through IR, but the earliest appointment date that we have been given is the 28th of July. We cannot send him to our local surgeon for placement due to his lack of insurance. Do you have any contacts in IR that you could reach out to in order to get him in any sooner than the 28th?  I appreciate your help! Let me know what you need from me, Lilia Pro

## 2021-01-20 NOTE — Telephone Encounter (Signed)
Patient scheduled @ [facility] IR for 7/19 @ 12pm.   Patient will need to be NPO after 8am, OK to take morning meds with water, and will need drive.  Patient will check in at main entrance of [facility] (Admissions).   RN left message for patient to call back, Re: Port Placement.

## 2021-01-23 ENCOUNTER — Other Ambulatory Visit: Payer: Self-pay | Admitting: Physician Assistant

## 2021-01-24 ENCOUNTER — Ambulatory Visit (HOSPITAL_COMMUNITY)
Admission: RE | Admit: 2021-01-24 | Discharge: 2021-01-24 | Disposition: A | Discharge status: Home / Self Care | Payer: Medicaid Other | Source: Ambulatory Visit | Attending: Hematology and Oncology | Admitting: Hematology and Oncology

## 2021-01-24 ENCOUNTER — Inpatient Hospital Stay (HOSPITAL_COMMUNITY): Payer: Self-pay

## 2021-01-24 ENCOUNTER — Other Ambulatory Visit (HOSPITAL_COMMUNITY): Payer: Self-pay | Admitting: Physician Assistant

## 2021-01-24 ENCOUNTER — Inpatient Hospital Stay (HOSPITAL_COMMUNITY): Payer: Medicaid Other | Admitting: General Practice

## 2021-01-24 ENCOUNTER — Ambulatory Visit (INDEPENDENT_AMBULATORY_CARE_PROVIDER_SITE_OTHER): Payer: Self-pay | Admitting: Gastroenterology

## 2021-01-24 ENCOUNTER — Encounter (HOSPITAL_COMMUNITY): Payer: Self-pay | Admitting: General Practice

## 2021-01-24 ENCOUNTER — Encounter (HOSPITAL_COMMUNITY): Payer: Self-pay

## 2021-01-24 ENCOUNTER — Other Ambulatory Visit: Payer: Self-pay

## 2021-01-24 DIAGNOSIS — C189 Malignant neoplasm of colon, unspecified: Secondary | ICD-10-CM | POA: Insufficient documentation

## 2021-01-24 DIAGNOSIS — C787 Secondary malignant neoplasm of liver and intrahepatic bile duct: Secondary | ICD-10-CM

## 2021-01-24 DIAGNOSIS — Z794 Long term (current) use of insulin: Secondary | ICD-10-CM | POA: Insufficient documentation

## 2021-01-24 DIAGNOSIS — Z79899 Other long term (current) drug therapy: Secondary | ICD-10-CM | POA: Insufficient documentation

## 2021-01-24 DIAGNOSIS — R110 Nausea: Secondary | ICD-10-CM

## 2021-01-24 DIAGNOSIS — Z5111 Encounter for antineoplastic chemotherapy: Secondary | ICD-10-CM | POA: Diagnosis not present

## 2021-01-24 HISTORY — PX: IR IMAGING GUIDED PORT INSERTION: IMG5740

## 2021-01-24 LAB — GLUCOSE, CAPILLARY: Glucose-Capillary: 120 mg/dL — ABNORMAL HIGH (ref 70–99)

## 2021-01-24 MED ORDER — HEPARIN SOD (PORK) LOCK FLUSH 100 UNIT/ML IV SOLN
INTRAVENOUS | Status: AC
  Filled 2021-01-24: qty 5

## 2021-01-24 MED ORDER — MIDAZOLAM HCL 2 MG/2ML IJ SOLN
INTRAMUSCULAR | Status: AC
  Filled 2021-01-24: qty 4

## 2021-01-24 MED ORDER — MIDAZOLAM HCL 2 MG/2ML IJ SOLN
INTRAMUSCULAR | Status: AC | PRN
  Administered 2021-01-24 (×3): 1 mg via INTRAVENOUS

## 2021-01-24 MED ORDER — LIDOCAINE-EPINEPHRINE 2 %-1:100000 IJ SOLN
INTRAMUSCULAR | Status: AC | PRN
  Administered 2021-01-24: 20 mL

## 2021-01-24 MED ORDER — LIDOCAINE-EPINEPHRINE 1 %-1:100000 IJ SOLN
INTRAMUSCULAR | Status: AC
  Filled 2021-01-24: qty 1

## 2021-01-24 MED ORDER — PROCHLORPERAZINE MALEATE 5 MG PO TABS: 5.0000 mg | ORAL_TABLET | Freq: Four times a day (QID) | ORAL | 3 refills | Status: DC | PRN

## 2021-01-24 MED ORDER — HEPARIN SOD (PORK) LOCK FLUSH 100 UNIT/ML IV SOLN
INTRAVENOUS | Status: AC | PRN
  Administered 2021-01-24: 500 [IU] via INTRAVENOUS

## 2021-01-24 MED ORDER — FENTANYL CITRATE (PF) 100 MCG/2ML IJ SOLN
INTRAMUSCULAR | Status: AC | PRN
  Administered 2021-01-24 (×2): 50 ug via INTRAVENOUS

## 2021-01-24 MED ORDER — FENTANYL CITRATE (PF) 100 MCG/2ML IJ SOLN
INTRAMUSCULAR | Status: AC
  Filled 2021-01-24: qty 4

## 2021-01-24 MED ORDER — SODIUM CHLORIDE 0.9 % IV SOLN: INTRAVENOUS | Status: DC

## 2021-01-24 NOTE — Patient Instructions (Addendum)
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  You will be treated in the clinic every 2 weeks with a combination of chemotherapy and other drugs.  Those drugs are oxaliplatin, leucovorin, and fluorouracil (5FU).  The intent of treatment is to control this cancer, prevent it from spreading further, and to alleviate any symptoms you may be having related to this disease. You will see the doctor regularly throughout treatment.  We will obtain blood work from you prior to every treatment and monitor your results to make sure it is safe to give your treatment. The doctor monitors your response to treatment by the way you are feeling, your blood work, and by obtaining scans periodically.  There will be wait times while you are here for treatment.  It will take about 30 minutes to 1 hour for your lab work to result.  Then there will be wait times while pharmacy mixes your medications.   Medications you will receive in the clinic prior to your chemotherapy medications:  Aloxi:  ALOXI is used in adults to help prevent nausea and vomiting that happens with certain chemotherapy drugs.  Aloxi is a long acting medication, and will remain in your system for about two days.   Dexamethasone:  This is a steroid given prior to chemotherapy to help prevent allergic reactions; it may also help prevent and control nausea and diarrhea.     Oxaliplatin (Eloxatin)  About This Drug  Oxaliplatin is used to treat cancer. It is given in the vein (IV).  It takes two hours to infuse.  Possible Side Effects   Bone marrow suppression. This is a decrease in the number of white blood cells, red blood cells, and platelets. This may raise your risk of infection, make you tired and weak (fatigue), and raise your risk of bleeding.   Tiredness   Soreness of the mouth and throat. You may have red areas, white patches, or sores that hurt.   Nausea and  vomiting (throwing up)   Diarrhea (loose bowel movements)   Changes in your liver function   Effects on the nerves called peripheral neuropathy. You may feel numbness, tingling, or pain in your hands and feet, and may be worse in cold temperatures. It may be hard for you to button your clothes, open jars, or walk as usual. The effect on the nerves may get worse with more doses of the drug. These effects get better in some people after the drug is stopped but it does not get better in all people  Note: Each of the side effects above was reported in 40% or greater of patients treated with oxaliplatin. Not all possible side effects are included above.   Warnings and Precautions   Allergic reactions, including anaphylaxis, which may be life-threatening are rare but may happen in some patients. Signs of allergic reaction to this drug may be swelling of the face, feeling like your tongue or throat are swelling, trouble breathing, rash, itching, fever, chills, feeling dizzy, and/or feeling that your heart is beating in a fast or not normal way. If this happens, do not take another dose of this drug. You should get urgent medical treatment.   Inflammation (swelling) of the lungs, which may be life-threatening. You may have a dry cough or trouble breathing.   Effects on the nerves (neuropathy) may resolve within 14 days, or it may persist beyond 14 days.   Severe decrease in  white blood cells when combined with the chemotherapy agents 5-fluorouracil and leucovorin. This may be life-threatening.   Severe changes in your liver function   Abnormal heart beat and/or EKG, which can be life-threatening   Rhabdomyolysis- damage to your muscles which may release proteins in your blood and affect how your kidneys work, which can be life-threatening. You may have severe muscle weakness and/or pain, or dark urine.  Important Information   This drug may impair your ability to drive or use machinery. Talk to  your doctor and/or nurse about precautions you may need to take.   This drug may be present in the saliva, tears, sweat, urine, stool, vomit, semen, and vaginal secretions. Talk to your doctor and/or your nurse about the necessary precautions to take during this time.  * The effects on the nerves can be aggravated by exposure to cold. Avoid cold beverages, use of ice and make sure you cover your skin and dress warmly prior to being exposed to cold temperatures while you are receiving treatment with oxaliplatin*   Treating Side Effects   Manage tiredness by pacing your activities for the day.   Be sure to include periods of rest between energy-draining activities.   To decrease the risk of infection, wash your hands regularly.   Avoid close contact with people who have a cold, the flu, or other infections.  Take your temperature as your doctor or nurse tells you, and whenever you feel like you may have a fever.   To help decrease the risk of bleeding, use a soft toothbrush. Check with your nurse before using dental floss.   Be very careful when using knives or tools.   Use an electric shaver instead of a razor.   Drink plenty of fluids (a minimum of eight glasses per day is recommended).   Mouth care is very important. Your mouth care should consist of routine, gentle cleaning of your teeth or dentures and rinsing your mouth with a mixture of 1/2 teaspoon of salt in 8 ounces of water or 1/2 teaspoon of baking soda in 8 ounces of water. This should be done at least after each meal and at bedtime.   If you have mouth sores, avoid mouthwash that has alcohol. Also avoid alcohol and smoking because they can bother your mouth and throat.   To help with nausea and vomiting, eat small, frequent meals instead of three large meals a day. Choose foods and drinks that are at room temperature. Ask your nurse or doctor about other helpful tips and medicine that is available to help stop or lessen these  symptoms.   If you throw up or have loose bowel movements, you should drink more fluids so that you do not become dehydrated (lack of water in the body from losing too much fluid).   If you have diarrhea, eat low-fiber foods that are high in protein and calories and avoid foods that can irritate your digestive tracts or lead to cramping.   Ask your nurse or doctor about medicine that can lessen or stop your diarrhea.   If you have numbness and tingling in your hands and feet, be careful when cooking, walking, and handling sharp objects and hot liquids.   Do not drink cold drinks or use ice in beverages. Drink fluids at room temperature or warmer, and drink through a straw.   Wear gloves to touch cold objects, and wear warm clothing and cover you skin during cold weather.   Food and Drug  Interactions   There are no known interactions of oxaliplatin with food and other medications.   This drug may interact with other medicines. Tell your doctor and pharmacist about all the prescription and over-the-counter medicines and dietary supplements (vitamins, minerals, herbs and others) that you are taking at this time. Also, check with your doctor or pharmacist before starting any new prescription or over-the-counter medicines, or dietary supplements to make sure that there are no interactions   When to Call the Doctor  Call your doctor or nurse if you have any of these symptoms and/or any new or unusual symptoms:   Fever of 100.4 F (38 C) or higher   Chills   Tiredness that interferes with your daily activities   Feeling dizzy or lightheaded   Easy bleeding or bruising   Feeling that your heart is beating in a fast or not normal way (palpitations)   Pain in your chest   Dry cough   Trouble breathing   Pain in your mouth or throat that makes it hard to eat or drink   Nausea that stops you from eating or drinking and/or is not relieved by prescribed medicines   Throwing up    Diarrhea, 4 times in one day or diarrhea with lack of strength or a feeling of being dizzy   Numbness, tingling, or pain in your hands and feet   Signs of possible liver problems: dark urine, pale bowel movements, bad stomach pain, feeling very tired and weak, unusual itching, or yellowing of the eyes or skin   Signs of rhabdomyolysis: decreased urine, very dark urine, muscle pain in the shoulders, thighs, or lower back; muscle weakness or trouble moving arms and legs   Signs of allergic reaction: swelling of the face, feeling like your tongue or throat are swelling, trouble breathing, rash, itching, fever, chills, feeling dizzy, and/or feeling that your heart is beating in a fast or not normal way. If this happens, call 911 for emergency care.   If you think you may be pregnant  Reproduction Warnings   Pregnancy warning: This drug may have harmful effects on the unborn baby. Women of childbearing potential should use effective methods of birth control during your cancer treatment. Let your doctor know right away if you think you may be pregnant or may have impregnated your partner.   Breastfeeding warning: It is not known if this drug passes into breast milk. For this reason, women should talk to their doctor about the risks and benefits of breastfeeding during treatment with this drug because this drug may enter the breast milk and cause harm to a breastfeeding baby.   Fertility warning: Human fertility studies have not been done with this drug. Talk with your doctor or nurse if you plan to have children. Ask for information on sperm or egg banking.   Leucovorin Calcium  About This Drug  Leucovorin is a vitamin. It is used in combination with 5-fluorouracil (5FU) to help it work better and last longer in your system. Leucovorin is given in the vein (IV).  This drug runs at the same time as the oxaliplatin and takes 2 hours to infuse.   Possible Side Effects  Rash and itching  Note:  Leucovorin by itself has very few side effects. Other side effects you may have can be caused by the other drugs you are taking, such as 5-fluorouracil.   Warnings and Precautions   Allergic reactions, including anaphylaxis are rare but may happen in some patients. Signs  of allergic reaction to this drug may be swelling of the face, feeling like your tongue or throat are swelling, trouble breathing, rash, itching, fever, chills, feeling dizzy, and/or feeling that your heart is beating in a fast or not normal way. If this happens, do not take another dose of this drug. You should get urgent medical treatment.  Food and Drug Interactions   There are no known interactions of leucovorin with food.   This drug may interact with other medicines. Tell your doctor and pharmacist about all the prescription and over-the-counter medicines and dietary supplements (vitamins, minerals, herbs and others) that you are taking at this time.   Also, check with your doctor or pharmacist before starting any new prescription or over-the-counter medicines, or dietary supplements to make sure that there are no interactions.   When to Call the Doctor  Call your doctor or nurse if you have any of these symptoms and/or any new or unusual symptoms:   A new rash or a rash that is not relieved by prescribed medicines   Signs of allergic reaction: swelling of the face, feeling like your tongue or throat are swelling, trouble breathing, rash, itching, fever, chills, feeling dizzy, and/or feeling that your heart is beating in a fast or not normal way. If this happens, call 911 for emergency care.   If you think you may be pregnant   Reproduction Warnings   Pregnancy warning: It is not known if this drug may harm an unborn child. For this reason, be sure to talk with your doctor if you are pregnant or planning to become pregnant while receiving this drug. Let your doctor know right away if you think you may be  pregnant   Breastfeeding warning: It is not known if this drug passes into breast milk. For this reason, women should talk to their doctor about the risks and benefits of breastfeeding during treatment with this drug because this drug may enter the breast milk and cause harm to a breastfeeding baby.   Fertility warning: Human fertility studies have not been done with this drug. Talk with your doctor or nurse if you plan to have children. Ask for information on sperm or egg banking.   5-Fluorouracil (Adrucil; 5FU)  About This Drug  Fluorouracil is used to treat cancer. It is given in the vein (IV). It is given as an IV push from a syringe over about 5 minutes, and also as a continuous infusion given via an ambulatory pump (a pump you take home and wear for a specified amount of time). You will wear your pump for about 46 hours.  We will place the pump at the end of your treatments and then you will return 2 days later to have [place] remove your pump.  Possible Side Effects   Bone marrow suppression. This is a decrease in the number of white blood cells, red blood cells, and platelets. This may raise your risk of infection, make you tired and weak (fatigue), and raise your risk of bleeding   Changes in the tissue of the heart and/or heart attack. Some changes may happen that can cause your heart to have less ability to pump blood.   Blurred vision or other changes in eyesight   Nausea and throwing up (vomiting)   Diarrhea (loose bowel movements)   Ulcers - sores that may cause pain or bleeding in your digestive tract, which includes your mouth, esophagus, stomach, small/large intestines and rectum   Soreness of the mouth  and throat. You may have red areas, white patches, or sores that hurt.   Allergic reactions, including anaphylaxis are rare but may happen in some patients. Signs of allergic reaction to this drug may be swelling of the face, feeling like your tongue or throat are swelling,  trouble breathing, rash, itching, fever, chills, feeling dizzy, and/or feeling that your heart is beating in a fast or not normal way. If this happens, do not take another dose of this drug. You should get urgent medical treatment.   Sensitivity to light (photosensitivity). Photosensitivity means that you may become more sensitive to the sun and/or light. You may get a skin rash/reaction if you are in the sun or are exposed to sun lamps and tanning beds. Your eyes may water more, mostly in bright light.   Changes in your nail color, nail loss and/or brittle nail   Darkening of the skin, or changes to the color of your skin and/or veins used for infusion   Rash, dry skin, or itching  Note: Not all possible side effects are included above.  Warnings and Precautions   Hand-and-foot syndrome. The palms of your hands or soles of your feet may tingle, become numb, painful, swollen, or red.   Changes in your central nervous system can happen. The central nervous system is made up of your brain and spinal cord. You could feel extreme tiredness, agitation, confusion, hallucinations (see or hear things that are not there), trouble understanding or speaking, loss of control of your bowels or bladder, eyesight changes, numbness or lack of strength to your arms, legs, face, or body, or coma. If you start to have any of these symptoms let your doctor know right away.   Side effects of this drug may be unexpectedly severe in some patients  Note: Some of the side effects above are very rare. If you have concerns and/or questions, please discuss them with your medical team.   Important Information   This drug may be present in the saliva, tears, sweat, urine, stool, vomit, semen, and vaginal secretions. Talk to your doctor and/or your nurse about the necessary precautions to take during this time.   Treating Side Effects   Manage tiredness by pacing your activities for the day.   Be sure to include  periods of rest between energy-draining activities.   To help decrease the risk of infections, wash your hands regularly.   Avoid close contact with people who have a cold, the flu, or other infections.   Take your temperature as your doctor or nurse tells you, and whenever you feel like you may have a fever.   Use a soft toothbrush. Check with your nurse before using dental floss.   Be very careful when using knives or tools.   Use an electric shaver instead of a razor.   If you have a nose bleed, sit with your head tipped slightly forward. Apply pressure by lightly pinching the bridge of your nose between your thumb and forefinger. Call your doctor if you feel dizzy or faint or if the bleeding doesn't stop after 10 to 15 minutes.   Drink plenty of fluids (a minimum of eight glasses per day is recommended).   If you throw up or have loose bowel movements, you should drink more fluids so that you do not  become dehydrated (lack of water in the body from losing too much fluid).   To help with nausea and vomiting, eat small, frequent meals instead of three large  meals a day. Choose foods and drinks that are at room temperature. Ask your nurse or doctor about other helpful tips and medicine that is available to help, stop, or lessen these symptoms.   If you have diarrhea, eat low-fiber foods that are high in protein and calories and avoid foods that can irritate your digestive tracts or lead to cramping.   Ask your nurse or doctor about medicine that can lessen or stop your diarrhea.   Mouth care is very important. Your mouth care should consist of routine, gentle cleaning of your teeth or dentures and rinsing your mouth with a mixture of 1/2 teaspoon of salt in 8 ounces of water or 1/2 teaspoon of baking soda in 8 ounces of water. This should be done at least after each meal and at bedtime.   If you have mouth sores, avoid mouthwash that has alcohol. Also avoid alcohol and smoking because  they can bother your mouth and throat.   Keeping your nails moisturized may help with brittleness.   To help with itching, moisturize your skin several times day.   Use sunscreen with SPF 30 or higher when you are outdoors even for a short time. Cover up when you are out in the sun. Wear wide-brimmed hats, long-sleeved shirts, and pants. Keep your neck, chest, and back covered. Wear dark sun glasses when in the sun or bright lights.   If you get a rash do not put anything on it unless your doctor or nurse says you may. Keep the area around the rash clean and dry. Ask your doctor for medicine if your rash bothers you.   Keeping your pain under control is important to your well-being. Please tell your doctor or nurse if you are experiencing pain.   Food and Drug Interactions   There are no known interactions of fluorouracil with food.   Check with your doctor or pharmacist about all other prescription medicines and over-the-counter medicines and dietary supplements (vitamins, minerals, herbs and others) you are taking before starting this medicine as there are known drug interactions with 5-fluoroucacil. Also, check with your doctor or pharmacist before starting any new prescription or over-the-counter medicines, or dietary supplements to make sure that there are no interactions.  When to Call the Doctor  Call your doctor or nurse if you have any of these symptoms and/or any new or unusual symptoms:   Fever of 100.4 F (38 C) or higher   Chills   Easy bleeding or bruising   Nose bleed that doesn't stop bleeding after 10-15 minutes   Trouble breathing   Feeling dizzy or lightheaded   Feeling that your heart is beating in a fast or not normal way (palpitations)   Chest pain or symptoms of a heart attack. Most heart attacks involve pain in the center of the chest that lasts more than a few minutes. The pain may go away and come back or it can be constant. It can feel like pressure,  squeezing, fullness, or pain. Sometimes pain is felt in one or both arms, the back, neck, jaw, or stomach. If any of these symptoms last 2 minutes, call 911.   Confusion and/or agitation   Hallucinations   Trouble understanding or speaking   Loss of control of bowels or bladder   Blurry vision or changes in your eyesight   Headache that does not go away   Numbness or lack of strength to your arms, legs, face, or body   Nausea that stops  you from eating or drinking and/or is not relieved by prescribed medicines   Throwing up more than 3 times a day   Diarrhea, 4 times in one day or diarrhea with lack of strength or a feeling of being dizzy   Pain in your mouth or throat that makes it hard to eat or drink   Pain along the digestive tract - especially if worse after eating   Blood in your vomit (bright red or coffee-ground) and/or stools (bright red, or black/tarry)   Coughing up blood   Tiredness that interferes with your daily activities   Painful, red, or swollen areas on your hands or feet or around your nails   A new rash or a rash that is not relieved by prescribed medicines   Develop sensitivity to sunlight/light   Numbness and/or tingling of your hands and/or feet   Signs of allergic reaction: swelling of the face, feeling like your tongue or throat are swelling, trouble breathing, rash, itching, fever, chills, feeling dizzy, and/or feeling that your heart is beating in a fast or not normal way. If this happens, call 911 for emergency care.   If you think you are pregnant or may have impregnated your partner  Reproduction Warnings   Pregnancy warning: This drug may have harmful effects on the unborn baby. Women of child bearing potential should use effective methods of birth control during your cancer treatment and 3 months after treatment. Men with male partners of childbearing potential should use effective methods of birth control during your cancer treatment  and for 3 months after your cancer treatment. Let your doctor know right away if you think you may be pregnant or may have impregnated your partner.   Breastfeeding warning: It is not known if this drug passes into breast milk. For this reason, Women should not breastfeed during treatment because this drug could enter the breast milk and cause harm to a breastfeeding baby.   Fertility warning: In men and women both, this drug may affect your ability to have children in the future. Talk with your doctor or nurse if you plan to have children. Ask for information on sperm or egg banking.   SELF CARE ACTIVITIES WHILE RECEIVING CHEMOTHERAPY:  Hydration Increase your fluid intake 48 hours prior to treatment and drink at least 8 to 12 cups (64 ounces) of water/decaffeinated beverages per day after treatment. You can still have your cup of coffee or soda but these beverages do not count as part of your 8 to 12 cups that you need to drink daily. No alcohol intake.  Medications Continue taking your normal prescription medication as prescribed.  If you start any new herbal or new supplements please let us know first to make sure it is safe.  Mouth Care Have teeth cleaned professionally before starting treatment. Keep dentures and partial plates clean. Use soft toothbrush and do not use mouthwashes that contain alcohol. Biotene is a good mouthwash that is available at most pharmacies or may be ordered by calling [phone]. Use warm salt water gargles (1 teaspoon salt per 1 quart warm water) before and after meals and at bedtime. If you need dental work, please let the doctor know before you go for your appointment so that we can coordinate the best possible time for you in regards to your chemo regimen. You need to also let your dentist know that you are actively taking chemo. We may need to do labs prior to your dental appointment.  Skin  Care Always use sunscreen that has not expired and with SPF (Sun  Protection Factor) of 50 or higher. Wear hats to protect your head from the sun. Remember to use sunscreen on your hands, ears, face, & feet.  Use good moisturizing lotions such as udder cream, eucerin, or even Vaseline. Some chemotherapies can cause dry skin, color changes in your skin and nails.    Avoid long, hot showers or baths. Use gentle, fragrance-free soaps and laundry detergent. Use moisturizers, preferably creams or ointments rather than lotions because the thicker consistency is better at preventing skin dehydration. Apply the cream or ointment within 15 minutes of showering. Reapply moisturizer at night, and moisturize your hands every time after you wash them.  Hair Loss (if your doctor says your hair will fall out)  If your doctor says that your hair is likely to fall out, decide before you begin chemo whether you want to wear a wig. You may want to shop before treatment to match your hair color. Hats, turbans, and scarves can also camouflage hair loss, although some people prefer to leave their heads uncovered. If you go bare-headed outdoors, be sure to use sunscreen on your scalp. Cut your hair short. It eases the inconvenience of shedding lots of hair, but it also can reduce the emotional impact of watching your hair fall out. Don't perm or color your hair during chemotherapy. Those chemical treatments are already damaging to hair and can enhance hair loss. Once your chemo treatments are done and your hair has grown back, it's OK to resume dyeing or perming hair.  With chemotherapy, hair loss is almost always temporary. But when it grows back, it may be a different color or texture. In older adults who still had hair color before chemotherapy, the new growth may be completely gray.  Often, new hair is very fine and soft.  Infection Prevention Please wash your hands for at least 30 seconds using warm soapy water. Handwashing is the #1 way to prevent the spread of germs. Stay away  from sick people or people who are getting over a cold. If you develop respiratory systems such as green/yellow mucus production or productive cough or persistent cough let us know and we will see if you need an antibiotic. It is a good idea to keep a pair of gloves on when going into grocery stores/[employer] to decrease your risk of coming into contact with germs on the carts, etc. Carry alcohol hand gel with you at all times and use it frequently if out in public. If your temperature reaches 100.5 or higher please call the clinic and let us know.  If it is after hours or on the weekend please go to the ER if your temperature is over 100.5.  Please have your own personal thermometer at home to use.    Sex and bodily fluids If you are going to have sex, a condom must be used to protect the person that isn't taking chemotherapy. Chemo can decrease your libido (sex drive). For a few days after chemotherapy, chemotherapy can be excreted through your bodily fluids.  When using the toilet please close the lid and flush the toilet twice.  Do this for a few day after you have had chemotherapy.   Effects of chemotherapy on your sex life Some changes are simple and won't last long. They won't affect your sex life permanently.  Sometimes you may feel: too tired not strong enough to be very active sick or sore  not in the mood anxious or low Your anxiety might not seem related to sex. For example, you may be worried about the cancer and how your treatment is going. Or you may be worried about money, or about how you family are coping with your illness.  These things can cause stress, which can affect your interest in sex. It's important to talk to your partner about how you feel.  Remember - the changes to your sex life don't usually last long. There's usually no medical reason to stop having sex during chemo. The drugs won't have any long term physical effects on your performance or enjoyment of sex. Cancer can't  be passed on to your partner during sex  Contraception It's important to use reliable contraception during treatment. Avoid getting pregnant while you or your partner are having chemotherapy. This is because the drugs may harm the baby. Sometimes chemotherapy drugs can leave a man or woman infertile.  This means you would not be able to have children in the future. You might want to talk to someone about permanent infertility. It can be very difficult to learn that you may no longer be able to have children. Some people find counselling helpful. There might be ways to preserve your fertility, although this is easier for men than for women. You may want to speak to a fertility expert. You can talk about sperm banking or harvesting your eggs. You can also ask about other fertility options, such as donor eggs. If you have or have had breast cancer, your doctor might advise you not to take the contraceptive pill. This is because the hormones in it might affect the cancer. It is not known for sure whether or not chemotherapy drugs can be passed on through semen or secretions from the vagina. Because of this some doctors advise people to use a barrier method if you have sex during treatment. This applies to vaginal, anal or oral sex. Generally, doctors advise a barrier method only for the time you are actually having the treatment and for about a week after your treatment. Advice like this can be worrying, but this does not mean that you have to avoid being intimate with your partner. You can still have close contact with your partner and continue to enjoy sex.  Animals If you have cats or birds we just ask that you not change the litter or change the cage.  Please have someone else do this for you while you are on chemotherapy.   Food Safety During and After Cancer Treatment Food safety is important for people both during and after cancer treatment. Cancer and cancer treatments, such as chemotherapy, radiation  therapy, and stem cell/bone marrow transplantation, often weaken the immune system. This makes it harder for your body to protect itself from foodborne illness, also called food poisoning. Foodborne illness is caused by eating food that contains harmful bacteria, parasites, or viruses.  Foods to avoid Some foods have a higher risk of becoming tainted with bacteria. These include: Unwashed fresh fruit and vegetables, especially leafy vegetables that can hide dirt and other contaminants Raw sprouts, such as alfalfa sprouts Raw or undercooked beef, especially ground beef, or other raw or undercooked meat and poultry Fatty, fried, or spicy foods immediately before or after treatment.  These can sit heavy on your stomach and make you feel nauseous. Raw or undercooked shellfish, such as oysters. Sushi and sashimi, which often contain raw fish.  Unpasteurized beverages, such as unpasteurized fruit juices, raw  milk, raw yogurt, or cider Undercooked eggs, such as soft boiled, over easy, and poached; raw, unpasteurized eggs; or foods made with raw egg, such as homemade raw cookie dough and homemade mayonnaise  Simple steps for food safety  Shop smart. Do not buy food stored or displayed in an unclean area. Do not buy bruised or damaged fruits or vegetables. Do not buy cans that have cracks, dents, or bulges. Pick up foods that can spoil at the end of your shopping trip and store them in a cooler on the way home.  Prepare and clean up foods carefully. Rinse all fresh fruits and vegetables under running water, and dry them with a clean towel or paper towel. Clean the top of cans before opening them. After preparing food, wash your hands for 20 seconds with hot water and soap. Pay special attention to areas between fingers and under nails. Clean your utensils and dishes with hot water and soap. Disinfect your kitchen and cutting boards using 1 teaspoon of liquid, unscented bleach mixed into 1 quart of  water.    Dispose of old food. Eat canned and packaged food before its expiration date (the "use by" or "best before" date). Consume refrigerated leftovers within 3 to 4 days. After that time, throw out the food. Even if the food does not smell or look spoiled, it still may be unsafe. Some bacteria, such as Listeria, can grow even on foods stored in the refrigerator if they are kept for too long.  Take precautions when eating out. At restaurants, avoid buffets and salad bars where food sits out for a long time and comes in contact with many people. Food can become contaminated when someone with a virus, often a norovirus, or another "bug" handles it. Put any leftover food in a "to-go" container yourself, rather than having the server do it. And, refrigerate leftovers as soon as you get home. Choose restaurants that are clean and that are willing to prepare your food as you order it cooked.   AT HOME MEDICATIONS:                                                                                                                                                                Compazine/Prochlorperazine 10mg  tablet. Take 1 tablet every 6 hours as needed for nausea/vomiting. (This can make you sleepy)   EMLA cream. Apply a quarter size amount to port site 1 hour prior to chemo. Do not rub in. Cover with plastic wrap.    Diarrhea Sheet   If you are having loose stools/diarrhea, please purchase Imodium and begin taking as outlined:  At the first sign of poorly formed or loose stools you should begin taking Imodium (loperamide) 2 mg capsules.  Take two tablets (4mg ) followed by  one tablet (2mg ) every 2 hours - DO NOT EXCEED 8 tablets in 24 hours.  If it is bedtime and you are having loose stools, take 2 tablets at bedtime, then 2 tablets every 4 hours until morning.   Always call the [facility] if you are having loose stools/diarrhea that you can't get under control.  Loose stools/diarrhea leads to  dehydration (loss of water) in your body.  We have other options of trying to get the loose stools/diarrhea to stop but you must let us know!   Constipation Sheet  Colace - 100 mg capsules - take 2 capsules daily.  If this doesn't help then you can increase to 2 capsules twice daily.  Please call if the above does not work for you. Do not go more than 2 days without a bowel movement.  It is very important that you do not become constipated.  It will make you feel sick to your stomach (nausea) and can cause abdominal pain and vomiting.  Nausea Sheet   Compazine/Prochlorperazine 10mg  tablet. Take 1 tablet every 6 hours as needed for nausea/vomiting (This can make you drowsy).  If you are having persistent nausea (nausea that does not stop) please call the [facility] and let us know the amount of nausea that you are experiencing.  If you begin to vomit, you need to call the [facility] and if it is the weekend and you have vomited more than one time and can't get it to stop-go to the Emergency Room.  Persistent nausea/vomiting can lead to dehydration (loss of fluid in your body) and will make you feel very weak and unwell. Ice chips, sips of clear liquids, foods that are at room temperature, crackers, and toast tend to be better tolerated.   SYMPTOMS TO REPORT AS SOON AS POSSIBLE AFTER TREATMENT:  FEVER GREATER THAN 100.4 F  CHILLS WITH OR WITHOUT FEVER  NAUSEA AND VOMITING THAT IS NOT CONTROLLED WITH YOUR NAUSEA MEDICATION  UNUSUAL SHORTNESS OF BREATH  UNUSUAL BRUISING OR BLEEDING  TENDERNESS IN MOUTH AND THROAT WITH OR WITHOUT PRESENCE OF ULCERS  URINARY PROBLEMS  BOWEL PROBLEMS  UNUSUAL RASH      Wear comfortable clothing and clothing appropriate for easy access to any Portacath or PICC line. Let us know if there is anything that we can do to make your therapy better!    What to do if you need assistance after hours or on the weekends: CALL [phone].  HOLD on the  line, do not hang up.  You will hear multiple messages but at the end you will be connected with a nurse triage line.  They will contact the doctor if necessary.  Most of the time they will be able to assist you.  Do not call the hospital operator.      I have been informed and understand all of the instructions given to me and have received a copy. I have been instructed to call the clinic [phone] or my family physician as soon as possible for continued medical care, if indicated. I do not have any more questions at this time but understand that I may call the [facility] or the Patient Navigator at [phone] during office hours should I have questions or need assistance in obtaining follow-up care.

## 2021-01-24 NOTE — Progress Notes (Signed)
Patient called and discussed with RN that he was having some dizziness after being started on Compazine.  We have decreased dose of Compazine to 5 mg every 6 hours, and have notified patient that concurrent use of Compazine and opioid pain medication can increase the risk of CNS depression including dizziness, drowsiness, and sedated state.  Further dose adjustments of patient's antiemetics and analgesia (currently taking morphine 15 mg every 6 hours as needed) to obtain balance between symptom relief and adverse side effects.  RN has called patient to inform him of these changes and of symptoms to be on the look out for.

## 2021-01-24 NOTE — Progress Notes (Signed)
[facility]  Initial Assessment   LATRAVIS GRINE is a 60 y.o. year old male contacted by phone. Clinical Social Work was referred by medical oncology for assessment of psychosocial needs.   SDOH (Social Determinants of Health) assessments performed: Yes   Distress Screen completed: No No flowsheet data found.    Family/Social Information:  Housing Arrangement: patient lives with wife - they are being housed by brother in his home as patient is out of work and little income is coming into the home - they could not afford to keep their own residence Family members/support persons in your life? Family, lives w wife, wife is retired.   Transportation concerns: yes, brother transports  Employment: Out of work due to pain. Income source: Supported by [employer] Financial concerns: Yes, due to illness and/or loss of work during treatment Type of concern: Utilities, Film/video editor, Designer, industrial/product, Transport planner, Haematologist, and medical bills.  Had to move in w brother as he has no income at this point.  "I dont have anything coming in at this point and it is killing me."   He has not been w current employer for long enough in order to qualify for any short or long term disability.  Was told he did not qualify for Social Security disability.   Food access concerns: yes, brother provides food as needed.   Religious or spiritual practice: yes, Baptist, not active at this time due to illness Medication Concerns: no, says his doctor ([place] - is working on getting his medications.  Former PCP was [employer] [facility], he has retired so he found another PCP.    Services Currently in place:  none  Coping/ Adjustment to diagnosis: Patient understands treatment plan and what happens next? yes, About 6 months ago, started process of determining why he was not feeling well/fatigued.  Was diagnosed June 6.  Out of work prior to diagnosis.  Diagnosed w colon cancer.   Concerns about  diagnosis and/or treatment: How I will care for other members of my family, How I will pay for the services I need, and financial stress Patient reported stressors: Housing, Insurance underwriter, Work/ school, and [employer] and priorities:"I want to get to feeling better, not 100%, but I am not able to do anything."  Wants more energy and get part of my life back.   Patient enjoys time with family/ friends and tries to "relax and do a few odds and ends."  Likes to  Current coping skills/ strengths: Armed forces logistics/support/administrative officer and Supportive family/friends    SUMMARY: Current SDOH Barriers:  Financial constraints related to being unable to work  Clinical Social Work Clinical Goal(s):  patient will work with SW to address concerns related to financial stressor due to cancer diagnosis and treatment.   Interventions: Discussed common feeling and emotions when being diagnosed with cancer, and the importance of support during treatment Informed patient of the support team roles and support services at [facility] Provided CSW contact information and encouraged patient to call with any questions or concerns Referred patient to [place] for disability assistance, [employer] for [employer] application assistance, Estate manager/land agent for [employer], Government social research officer for [employer] as needed.    Follow Up Plan: CSW will follow-up with patient by phone in two weeks Patient verbalizes understanding of plan: Yes    Beverely Pace , [place], [place] Worker Phone:  [phone]

## 2021-01-24 NOTE — Procedures (Signed)
Vascular and Interventional Radiology Procedure Note  Patient: Henry Johns DOB: Dec 15, 1960 Medical Record Number: [id number] Note Date/Time: 01/24/21 3:53 PM   Performing Physician: Michaelle Birks, MD Assistant(s): None  Diagnosis: Colon cancer  Procedure: PORT PLACEMENT  Anesthesia: Conscious Sedation Complications: None Estimated Blood Loss: Minimal  Findings:  Successful right-sided port placement, with the tip of the catheter in the proximal right atrium.  Plan: Catheter ready for use.  See detailed procedure note with images in PACS. The patient tolerated the procedure well without incident or complication and was returned to Recovery in stable condition.    Michaelle Birks, MD [facility] Radiology   Clinic: [phone]

## 2021-01-24 NOTE — H&P (Addendum)
Chief Complaint: Patient was seen in consultation today for port placement at the request of [place]  Referring Physician(s): [place]  Supervising Physician: Jacqulynn Cadet  Patient Status: [facility] - Out-pt  History of Present Illness: Henry Johns is a 60 y.o. male with metastatic colon cancer. He is to start chemotherapy soon and is referred for port placement. PMHx, meds, labs, imaging, allergies reviewed. Feels well, no recent fevers, chills, illness. Has been NPO today as directed.   Past Medical History:  Diagnosis Date   Diabetes mellitus without complication ([place])    Hypertension     Past Surgical History:  Procedure Laterality Date   BIOPSY  01/13/2021   Procedure: BIOPSY;  Surgeon: Harvel Quale, MD;  Location: [facility];  Service: Gastroenterology;;   CATARACT EXTRACTION Right    COLONOSCOPY WITH PROPOFOL N/A 01/13/2021   Procedure: COLONOSCOPY WITH PROPOFOL;  Surgeon: Harvel Quale, MD;  Location: [facility];  Service: Gastroenterology;  Laterality: N/A;  11:05   ESOPHAGOGASTRODUODENOSCOPY (EGD) WITH PROPOFOL N/A 01/13/2021   Procedure: ESOPHAGOGASTRODUODENOSCOPY (EGD) WITH PROPOFOL;  Surgeon: Harvel Quale, MD;  Location: [facility];  Service: Gastroenterology;  Laterality: N/A;   LIVER BIOPSY  01/10/2021   U/S guided   POLYPECTOMY  01/13/2021   Procedure: POLYPECTOMY;  Surgeon: Harvel Quale, MD;  Location: [facility];  Service: Gastroenterology;;    Allergies: Patient has no known allergies.  Medications: Prior to Admission medications   Medication Sig Start Date End Date Taking? Authorizing Provider  acetaminophen (TYLENOL) 500 MG tablet Take 1,000 mg by mouth every 6 (six) hours as needed for moderate pain.   Yes [provider]  atenolol (TENORMIN) 50 MG tablet Take 50 mg by mouth daily. 11/07/20  Yes [provider]  insulin NPH-regular Human (70-30) 100 UNIT/ML  injection Inject 10-12 Units into the skin 2 (two) times daily.   Yes [provider]  morphine (MSIR) 15 MG tablet Take 1 tablet (15 mg total) by mouth every 6 (six) hours as needed for severe pain. 01/18/21  Yes Heath Lark, MD  Multiple Vitamin (MULTIVITAMIN WITH MINERALS) TABS tablet Take 1 tablet by mouth daily.   Yes [provider]  omeprazole (PRILOSEC) 40 MG capsule Take 1 capsule (40 mg total) by mouth daily. 01/16/21  Yes Harvel Quale, MD  polyethylene glycol (MIRALAX) 17 g packet Take 17 g by mouth daily. 01/04/21  Yes Heath Lark, MD  prochlorperazine (COMPAZINE) 5 MG tablet Take 1 tablet (5 mg total) by mouth every 6 (six) hours as needed for nausea or vomiting. 01/24/21  Yes Leron Croak, Rebekah M, PA-C  lidocaine-prilocaine (EMLA) cream Apply to affected area once 01/18/21   Heath Lark, MD  ondansetron (ZOFRAN ODT) 4 MG disintegrating tablet Take 1 tablet (4 mg total) by mouth every 8 (eight) hours as needed for nausea or vomiting. Patient not taking: Reported on 01/18/2021 12/26/20   Sherwood Gambler, MD     Family History  Problem Relation Age of Onset   Cancer Father 78       prostate ca    Social History   Socioeconomic History   Marital status: Married    Spouse name: Not on file   Number of children: Not on file   Years of education: Not on file   Highest education level: Not on file  Occupational History   Not on file  Tobacco Use   Smoking status: Never   Smokeless tobacco: Never  Vaping Use  Vaping Use: Never used  Substance and Sexual Activity   Alcohol use: Not Currently   Drug use: Not Currently   Sexual activity: Not Currently  Other Topics Concern   Not on file  Social History Narrative   Married, no childer   Social Determinants of Health   Financial Resource Strain: Not on file  Food Insecurity: Not on file  Transportation Needs: No Transportation Needs   Lack of Transportation (Medical): No   Lack of  Transportation (Non-Medical): No  Physical Activity: Inactive   Days of Exercise per Week: 0 days   Minutes of Exercise per Session: 0 min  Stress: Not on file  Social Connections: Not on file    Review of Systems: A 12 point ROS discussed and pertinent positives are indicated in the HPI above.  All other systems are negative.  Review of Systems  Vital Signs: BP 134/78   Pulse (!) 102   Temp 98.1 F (36.7 C) (Oral)   Resp 18   Ht 5\' 8"  (1.727 m)   Wt 81.6 kg   SpO2 93%   BMI 27.37 kg/m   Physical Exam Constitutional:      General: He is not in acute distress.    Appearance: Normal appearance. He is not ill-appearing.  HENT:     Mouth/Throat:     Mouth: Mucous membranes are moist.     Pharynx: Oropharynx is clear.  Cardiovascular:     Rate and Rhythm: Normal rate and regular rhythm.     Heart sounds: Normal heart sounds.  Pulmonary:     Effort: Pulmonary effort is normal. No respiratory distress.     Breath sounds: Normal breath sounds.  Skin:    General: Skin is warm and dry.  Neurological:     General: No focal deficit present.     Mental Status: He is alert and oriented to person, place, and time.  Psychiatric:        Mood and Affect: Mood normal.        Thought Content: Thought content normal.        Judgment: Judgment normal.      Imaging: CT Chest Wo Contrast  Result Date: 12/26/2020 CLINICAL DATA:  Abdominal pain, nausea vomiting. Follow-up to an abnormal abdomen and pelvis CT performed earlier today to assess for malignancy of unknown primary. EXAM: CT CHEST WITHOUT CONTRAST TECHNIQUE: Multidetector CT imaging of the chest was performed following the standard protocol without IV contrast. COMPARISON:  Current abdomen and pelvis CT. FINDINGS: Cardiovascular: Heart is normal in size and configuration. No pericardial effusion mild left coronary artery calcifications. Great vessels are normal in caliber. No aortic or arch branch vessel atherosclerotic  calcifications. Mediastinum/Nodes: Visualized thyroid is unremarkable. No neck base, axillary, mediastinal or hilar masses or enlarged lymph nodes. Trachea and esophagus are unremarkable. Lungs/Pleura: Several lung nodules. Largest nodule, peripheral right lower lobe, image 96, series 4, 7 mm. Additional nodules: 4 mm nodule, left upper lobe, image 33. 3-4 mm nodule, right upper lobe, image 66. 6 mm nodule, right middle lobe abutting the minor fissure, image 70. Two small calcified granuloma in the right middle lobe. Minor linear atelectasis at the posterior lung bases. Remainder of the lungs is clear. No pleural effusion or pneumothorax. Upper Abdomen: No change compared to abdomen and pelvis CT obtained earlier today. Musculoskeletal: No fracture or acute finding. No osteoblastic or osteolytic lesions. IMPRESSION: 1. Several small lung nodules consistent with metastatic disease. The primary malignancy remains unclear. The liver morphology  suggests underlying cirrhosis, and multifocal hepatocellular carcinoma should be included in the differential diagnosis. 2. No acute abnormality in the chest. Electronically Signed   By: Lajean Manes M.D.   On: 12/26/2020 21:26   CT ABDOMEN PELVIS W CONTRAST  Result Date: 12/26/2020 CLINICAL DATA:  60 year old male with abdominal pain. EXAM: CT ABDOMEN AND PELVIS WITH CONTRAST TECHNIQUE: Multidetector CT imaging of the abdomen and pelvis was performed using the standard protocol following bolus administration of intravenous contrast. CONTRAST:  191mL OMNIPAQUE IOHEXOL 300 MG/ML  SOLN COMPARISON:  None. FINDINGS: Lower chest: There is a 7 mm nodule in the right lung base, likely metastatic disease. Chest CT is recommended for complete evaluation of the lungs. No intra-abdominal free air. Small ascites. Hepatobiliary: Multiple (greater than 20) hepatic hypodense lesions measuring up to 2.9 x 3.4 cm in the left lobe of the liver consistent with metastatic disease. No  intrahepatic biliary ductal dilatation. The gallbladder is unremarkable. Pancreas: Unremarkable. No pancreatic ductal dilatation or surrounding inflammatory changes. Spleen: Normal in size without focal abnormality. Adrenals/Urinary Tract: The adrenal glands unremarkable. Subcentimeter right renal interpolar hypodense lesion is not characterized, likely a cyst. There is no hydronephrosis on either side. There is symmetric enhancement and excretion of contrast by both kidneys. The visualized ureters and urinary bladder appear unremarkable. Stomach/Bowel: There is no bowel obstruction or active inflammation. The appendix is normal. Vascular/Lymphatic: Mild aortoiliac atherosclerotic disease. The IVC is unremarkable. No portal venous gas. Retroperitoneal/aortocaval adenopathy measure up to 16 mm in short axis. There is mild nodularity of the omentum in the right hemiabdomen (42/2). Reproductive: The prostate and seminal vesicles are grossly unremarkable. Other: None Musculoskeletal: Osteopenia with degenerative changes of the spine. No acute osseous pathology. IMPRESSION: 1. Extensive hepatic metastatic disease and retroperitoneal adenopathy. 2. A 7 mm right lung base nodule, most consistent with metastatic disease. 3. No bowel obstruction. Normal appendix. 4. Aortic Atherosclerosis (ICD10-I70.0). Electronically Signed   By: Anner Crete M.D.   On: 12/26/2020 20:45   US BIOPSY (LIVER)  Result Date: 01/10/2021 INDICATION: 60 year old male referred for biopsy of liver lesion EXAM: ULTRASOUND-GUIDED LIVER LESION BIOPSY MEDICATIONS: None. ANESTHESIA/SEDATION: Moderate (conscious) sedation was employed during this procedure. A total of Versed 1.0 mg and Fentanyl 50 mcg was administered intravenously. Moderate Sedation Time: 11 minutes. The patient's level of consciousness and vital signs were monitored continuously by radiology nursing throughout the procedure under my direct supervision. FLUOROSCOPY TIME:   Ultrasound COMPLICATIONS: None PROCEDURE: Informed written consent was obtained from the patient after a thorough discussion of the procedural risks, benefits and alternatives. All questions were addressed. Maximal Sterile Barrier Technique was utilized including caps, mask, sterile gowns, sterile gloves, sterile drape, hand hygiene and skin antiseptic. A timeout was performed prior to the initiation of the procedure. Ultrasound survey of the bilateral liver lobe performed with images stored and sent to PACs. The sub xiphoid region was prepped with chlorhexidine in a sterile fashion, and a sterile drape was applied covering the operative field. A sterile gown and sterile gloves were used for the procedure. Local anesthesia was provided with 1% Lidocaine. The patient was prepped and draped sterilely and the skin and subcutaneous tissues were generously infiltrated with 1% lidocaine. A 17 gauge introducer needle was then advanced under ultrasound guidance in the subxiphoid location into the left liver lobe, targeting heterogeneously hypoechoic lesion. The stylet was removed, and multiple separate 18 gauge core biopsy were retrieved. Samples were placed into formalin for transportation to the lab. Gel-Foam pledgets were  then infused with a small amount of saline for assistance with hemostasis. The needle was removed, and a final ultrasound image was performed. The patient tolerated the procedure well and remained hemodynamically stable throughout. No complications were encountered and no significant blood loss was encounter. IMPRESSION: Status post ultrasound-guided biopsy of left liver mass. Signed, Dulcy Fanny. Dellia Nims, RPVI [facility] Radiology Electronically Signed   By: Corrie Mckusick D.O.   On: 01/10/2021 17:01    Labs:  CBC: Recent Labs    12/26/20 1834 01/10/21 1121  WBC 16.3* 15.3*  HGB 11.7* 12.2*  HCT 35.0* 36.5*  PLT 327 383    COAGS: Recent  Labs    01/10/21 1121  INR 1.0    BMP: Recent Labs    12/26/20 1834  NA 134*  K 4.4  CL 94*  CO2 28  GLUCOSE 153*  BUN 11  CALCIUM 9.4  CREATININE 0.75  GFRNONAA >60    LIVER FUNCTION TESTS: Recent Labs    12/26/20 1834  BILITOT 2.5*  AST 87*  ALT 47*  ALKPHOS 543*  PROT 6.9  ALBUMIN 2.8*    TUMOR MARKERS: No results for input(s): AFPTM, CEA, CA199, CHROMGRNA in the last 8760 hours.  Assessment and Plan: Metastatic colon cancer For port placement Risks and benefits of image guided port-a-catheter placement was discussed with the patient including, but not limited to bleeding, infection, pneumothorax, or fibrin sheath development and need for additional procedures.  All of the patient's questions were answered, patient is agreeable to proceed. Consent signed and in chart.   Thank you for this interesting consult.  I greatly enjoyed meeting GEORG ANG and look forward to participating in their care.  A copy of this report was sent to the requesting provider on this date.  Electronically Signed: Ascencion Dike, PA-C 01/24/2021, 1:40 PM   I spent a total of 20 minutes in face to face in clinical consultation, greater than 50% of which was counseling/coordinating care for port

## 2021-01-24 NOTE — Progress Notes (Signed)
Pt called radiology nurses station stating that he is having issues with pain medication and nausea. Pt states that if he cannot have something prescribed for feeling so "swimmy headed" he will not be able to come for procedure today. I advised that he will need to call his prescribing MD, Gorusch. I provided him with the phone number. Pt was very appreciative.

## 2021-01-25 ENCOUNTER — Inpatient Hospital Stay (HOSPITAL_COMMUNITY): Payer: Medicaid Other

## 2021-01-25 ENCOUNTER — Inpatient Hospital Stay (HOSPITAL_BASED_OUTPATIENT_CLINIC_OR_DEPARTMENT_OTHER): Payer: Medicaid Other | Admitting: Hematology and Oncology

## 2021-01-25 VITALS — BP 142/83 | HR 78 | Temp 97.0°F | Resp 18

## 2021-01-25 DIAGNOSIS — C787 Secondary malignant neoplasm of liver and intrahepatic bile duct: Secondary | ICD-10-CM

## 2021-01-25 DIAGNOSIS — G893 Neoplasm related pain (acute) (chronic): Secondary | ICD-10-CM

## 2021-01-25 DIAGNOSIS — C189 Malignant neoplasm of colon, unspecified: Secondary | ICD-10-CM

## 2021-01-25 DIAGNOSIS — Z5111 Encounter for antineoplastic chemotherapy: Secondary | ICD-10-CM | POA: Diagnosis not present

## 2021-01-25 LAB — CBC WITH DIFFERENTIAL/PLATELET
Abs Immature Granulocytes: 0.05 10*3/uL (ref 0.00–0.07)
Basophils Absolute: 0.1 10*3/uL (ref 0.0–0.1)
Basophils Relative: 1 %
Eosinophils Absolute: 1.5 10*3/uL — ABNORMAL HIGH (ref 0.0–0.5)
Eosinophils Relative: 13 %
HCT: 32.6 % — ABNORMAL LOW (ref 39.0–52.0)
Hemoglobin: 11.1 g/dL — ABNORMAL LOW (ref 13.0–17.0)
Immature Granulocytes: 0 %
Lymphocytes Relative: 13 %
Lymphs Abs: 1.6 10*3/uL (ref 0.7–4.0)
MCH: 30.8 pg (ref 26.0–34.0)
MCHC: 34 g/dL (ref 30.0–36.0)
MCV: 90.6 fL (ref 80.0–100.0)
Monocytes Absolute: 1 10*3/uL (ref 0.1–1.0)
Monocytes Relative: 8 %
Neutro Abs: 7.8 10*3/uL — ABNORMAL HIGH (ref 1.7–7.7)
Neutrophils Relative %: 65 %
Platelets: 265 10*3/uL (ref 150–400)
RBC: 3.6 MIL/uL — ABNORMAL LOW (ref 4.22–5.81)
RDW: 15.1 % (ref 11.5–15.5)
WBC: 12.1 10*3/uL — ABNORMAL HIGH (ref 4.0–10.5)
nRBC: 0 % (ref 0.0–0.2)

## 2021-01-25 LAB — COMPREHENSIVE METABOLIC PANEL
ALT: 29 U/L (ref 0–44)
AST: 67 U/L — ABNORMAL HIGH (ref 15–41)
Albumin: 2.8 g/dL — ABNORMAL LOW (ref 3.5–5.0)
Alkaline Phosphatase: 788 U/L — ABNORMAL HIGH (ref 38–126)
Anion gap: 8 (ref 5–15)
BUN: 13 mg/dL (ref 6–20)
CO2: 27 mmol/L (ref 22–32)
Calcium: 8.6 mg/dL — ABNORMAL LOW (ref 8.9–10.3)
Chloride: 97 mmol/L — ABNORMAL LOW (ref 98–111)
Creatinine, Ser: 0.72 mg/dL (ref 0.61–1.24)
GFR, Estimated: >60 mL/min (ref >60)
Glucose, Bld: 161 mg/dL — ABNORMAL HIGH (ref 70–99)
Potassium: 4.2 mmol/L (ref 3.5–5.1)
Sodium: 132 mmol/L — ABNORMAL LOW (ref 135–145)
Total Bilirubin: 1.7 mg/dL — ABNORMAL HIGH (ref 0.3–1.2)
Total Protein: 6.9 g/dL (ref 6.5–8.1)

## 2021-01-25 LAB — MAGNESIUM: Magnesium: 1.9 mg/dL (ref 1.7–2.4)

## 2021-01-25 MED ORDER — SODIUM CHLORIDE 0.9 % IV SOLN
5000.0000 mg | INTRAVENOUS | Status: DC
  Administered 2021-01-25: 5000 mg via INTRAVENOUS
  Filled 2021-01-25: qty 100

## 2021-01-25 MED ORDER — DEXTROSE 5 % IV SOLN
400.0000 mg/m2 | Freq: Once | INTRAVENOUS | Status: AC
  Administered 2021-01-25: 784 mg via INTRAVENOUS
  Filled 2021-01-25: qty 39.2

## 2021-01-25 MED ORDER — PALONOSETRON HCL INJECTION 0.25 MG/5ML
0.2500 mg | Freq: Once | INTRAVENOUS | Status: AC
  Administered 2021-01-25: 0.25 mg via INTRAVENOUS
  Filled 2021-01-25: qty 5

## 2021-01-25 MED ORDER — DEXTROSE 5 % IV SOLN: Freq: Once | INTRAVENOUS | Status: AC

## 2021-01-25 MED ORDER — ONDANSETRON HCL 8 MG PO TABS: 8.0000 mg | ORAL_TABLET | Freq: Three times a day (TID) | ORAL | 3 refills | Status: DC | PRN

## 2021-01-25 MED ORDER — DEXAMETHASONE SODIUM PHOSPHATE 100 MG/10ML IJ SOLN
10.0000 mg | Freq: Once | INTRAMUSCULAR | Status: AC
  Administered 2021-01-25: 10 mg via INTRAVENOUS
  Filled 2021-01-25: qty 10

## 2021-01-25 MED ORDER — OXALIPLATIN CHEMO INJECTION 100 MG/20ML
85.0000 mg/m2 | Freq: Once | INTRAVENOUS | Status: AC
  Administered 2021-01-25: 165 mg via INTRAVENOUS
  Filled 2021-01-25: qty 33

## 2021-01-25 NOTE — Patient Instructions (Signed)
[facility]  Discharge Instructions: Thank you for choosing [facility] to provide your oncology and hematology care.  If you have a lab appointment with the Cancer Center, please come in thru the Main Entrance and check in at the main information desk.  Wear comfortable clothing and clothing appropriate for easy access to any Portacath or PICC line.   We strive to give you quality time with your provider. You may need to reschedule your appointment if you arrive late (15 or more minutes).  Arriving late affects you and other patients whose appointments are after yours.  Also, if you miss three or more appointments without notifying the office, you may be dismissed from the clinic at the provider's discretion.      For prescription refill requests, have your pharmacy contact our office and allow 72 hours for refills to be completed.    Today you received the following chemotherapy    To help prevent nausea and vomiting after your treatment, we encourage you to take your nausea medication as directed.  BELOW ARE SYMPTOMS THAT SHOULD BE REPORTED IMMEDIATELY: . *FEVER GREATER THAN 100.4 F (38 C) OR HIGHER . *CHILLS OR SWEATING . *NAUSEA AND VOMITING THAT IS NOT CONTROLLED WITH YOUR NAUSEA MEDICATION . *UNUSUAL SHORTNESS OF BREATH . *UNUSUAL BRUISING OR BLEEDING . *URINARY PROBLEMS (pain or burning when urinating, or frequent urination) . *BOWEL PROBLEMS (unusual diarrhea, constipation, pain near the anus) . TENDERNESS IN MOUTH AND THROAT WITH OR WITHOUT PRESENCE OF ULCERS (sore throat, sores in mouth, or a toothache) . UNUSUAL RASH, SWELLING OR PAIN  . UNUSUAL VAGINAL DISCHARGE OR ITCHING   Items with * indicate a potential emergency and should be followed up as soon as possible or go to the Emergency Department if any problems should occur.  Please show the CHEMOTHERAPY ALERT CARD or IMMUNOTHERAPY ALERT CARD at check-in to the Emergency Department and triage  nurse.  Should you have questions after your visit or need to cancel or reschedule your appointment, please contact [facility] [phone]  and follow the prompts.  Office hours are 8:00 a.m. to 4:30 p.m. Monday - Friday. Please note that voicemails left after 4:00 p.m. may not be returned until the following business day.  We are closed weekends and major holidays. You have access to a nurse at all times for urgent questions. Please call the main number to the clinic [phone] and follow the prompts.  For any non-urgent questions, you may also contact your provider using MyChart. We now offer e-Visits for anyone 18 and older to request care online for non-urgent symptoms. For details visit [email or web address].   Also download the MyChart app! Go to the app store, search "MyChart", open the app, select [place], and log in with your MyChart username and password.  Due to Covid, a mask is required upon entering the hospital/clinic. If you do not have a mask, one will be given to you upon arrival. For doctor visits, patients may have 1 support person aged 18 or older with them. For treatment visits, patients cannot have anyone with them due to current Covid guidelines and our immunocompromised population.  

## 2021-01-25 NOTE — Assessment & Plan Note (Signed)
I have ordered molecular testing on his liver biopsy, results are pending MMR testing is normal He is starting on first dose of FOLFOX chemotherapy I will see him next week for further follow-up

## 2021-01-25 NOTE — Progress Notes (Unsigned)

## 2021-01-25 NOTE — Progress Notes (Signed)
Labs reviewed today by Dr. Alvy Bimler. Will proceed with day one today as planned per MD.   Ambulatory pump teaching done at bedside today. Infusystem video viewed by patient and brother, no other questions. Product/process development scientist given. Will call and check on patient tomorrow.   Treatment given per orders. Patient tolerated it well without problems. Vitals stable and discharged home from clinic ambulatory. Follow up as scheduled.

## 2021-01-25 NOTE — Progress Notes (Signed)
[place] OFFICE PROGRESS NOTE  Patient Care Team: Lucia Gaskins, MD as PCP - General (Internal Medicine) Gala Romney, Cristopher Estimable, MD as Consulting Physician (Gastroenterology) Brien Mates, RN as Oncology Nurse Navigator (Oncology)  ASSESSMENT & PLAN:  Colon cancer metastasized to liver [facility]) I have ordered molecular testing on his liver biopsy, results are pending MMR testing is normal He is starting on first dose of FOLFOX chemotherapy I will see him next week for further follow-up  Metastasis to liver [facility]) He has abnormal liver enzymes, stable We will proceed with chemotherapy as scheduled  Cancer associated pain He had recent sedation and nausea with morphine but the morphine sulfate is controlling his pain I recommend him to take antiemetics as needed For now, I encouraged him to continue taking morphine sulfate rather than switching to a different type I will reassess pain control next week  No orders of the defined types were placed in this encounter.   All questions were answered. The patient knows to call the clinic with any problems, questions or concerns. The total time spent in the appointment was 20 minutes encounter with patients including review of chart and various tests results, discussions about plan of care and coordination of care plan   Heath Lark, MD 01/25/2021 12:23 PM  INTERVAL HISTORY: Please see below for problem oriented charting. He is seen as an urgent add-on because of problems with his pain medicine Last night, he developed some nausea and sleepiness with morphine sulfate His pain is well controlled with morphine He denies recent constipation He felt that he had significant sedation with Compazine He tolerated Zofran well  SUMMARY OF ONCOLOGIC HISTORY: Oncology History Overview Note  MMR normal   Metastasis to liver ([place])  01/04/2021 Initial Diagnosis   Metastasis to liver ([place])    01/25/2021 -  Chemotherapy     Patient is on Treatment Plan: COLORECTAL FOLFOX Q14D       Colon cancer metastasized to liver ([place])  12/26/2020 Imaging   Ct abdomen and pelvis 1. Extensive hepatic metastatic disease and retroperitoneal adenopathy. 2. A 7 mm right lung base nodule, most consistent with metastatic disease. 3. No bowel obstruction. Normal appendix. 4. Aortic Atherosclerosis (ICD10-I70.0).   12/26/2020 Imaging   CT chest 1. Several small lung nodules consistent with metastatic disease. The primary malignancy remains unclear. The liver morphology suggests underlying cirrhosis, and multifocal hepatocellular carcinoma should be included in the differential diagnosis. 2. No acute abnormality in the chest.     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 LIVER, NEEDLE CORE BIOPSY:  -  Metastatic adenocarcinoma  -  See comment   COMMENT:   The neoplastic cells are positive for cytokeratin 20 and CDX2 but negative for cytokeratin 7, TTF-1, PSA and prostein.  The combined morphology and immunophenotype are consistent with metastasis from a  gastrointestinal primary.  Dr. Saralyn Pilar reviewed the case and agrees with the above diagnosis.     01/13/2021 Procedure   Colonoscopy - Two 3 to 5 mm polyps in the transverse colon, removed with a cold snare. Resected and retrieved. - Malignant partially obstructing tumor at 17 cm proximal to the anus. Biopsied. Tattooed. - The distal rectum and anal verge are normal on retroflexion view.   01/13/2021 Cancer Staging   Staging form: Colon and Rectum, AJCC 8th Edition - Clinical stage from 01/13/2021: Stage IVB (cTX, cN0, pM1b) - Signed by Heath Lark, MD on 01/13/2021  Stage prefix: Initial diagnosis  Total positive nodes: 0    01/13/2021 Procedure   Colonoscopy - Two 3 to 5 mm polyps in the transverse colon, removed with a cold snare. Resected and retrieved. - Malignant partially obstructing tumor at 17 cm proximal  to the anus. Biopsied. Tattooed. - The distal rectum and anal verge are normal on retroflexion view.   01/13/2021 Procedure   EGD - Normal esophagus. - Z-line regular, 40 cm from the incisors. - Normal stomach. - Nodular mucosa in   01/13/2021 Pathology Results   A. DUODENUM, NODULE, BIOPSY:  - Peptic duodenitis.  - No dysplasia or malignancy.   B. COLON, SIGMOID, MASS, BIOPSY:  - Invasive adenocarcinoma.   C. COLON, TRANSVERSE, POLYPECTOMY:  - Fragment of ulcerated tissue with adenocarcinoma, see comment.  - Tubular adenoma (X2 fragments).   COMMENT:   B. MMR will be ordered   01/13/2021 Tumor Marker   Patient's tumor was tested for the following markers: CEA. Results of the tumor marker test revealed 883.   01/24/2021 Procedure   Successful right-sided port placement, with the tip of the catheter in the proximal right atrium.   Plan: Catheter ready for use.   See detailed procedure note with images in PACS. The patient tolerated the procedure well without incident or complication and was returned to Recovery in stable conditi   01/25/2021 -  Chemotherapy    Patient is on Treatment Plan: COLORECTAL FOLFOX Q14D         REVIEW OF SYSTEMS:   Constitutional: Denies fevers, chills or abnormal weight loss Eyes: Denies blurriness of vision Ears, nose, mouth, throat, and face: Denies mucositis or sore throat Respiratory: Denies cough, dyspnea or wheezes Cardiovascular: Denies palpitation, chest discomfort or lower extremity swelling Skin: Denies abnormal skin rashes Lymphatics: Denies new lymphadenopathy or easy bruising Neurological:Denies numbness, tingling or new weaknesses Behavioral/Psych: Mood is stable, no new changes  All other systems were reviewed with the patient and are negative.  I have reviewed the past medical history, past surgical history, social history and family history with the patient and they are unchanged from previous note.  ALLERGIES:  has No  Known Allergies.  MEDICATIONS:  Current Outpatient Medications  Medication Sig Dispense Refill   ondansetron (ZOFRAN) 8 MG tablet Take 1 tablet (8 mg total) by mouth every 8 (eight) hours as needed for nausea. 30 tablet 3   acetaminophen (TYLENOL) 500 MG tablet Take 1,000 mg by mouth every 6 (six) hours as needed for moderate pain.     atenolol (TENORMIN) 50 MG tablet Take 50 mg by mouth daily.     fluorouracil CALGB 84665 2,400 mg/m2 in sodium chloride 0.9 % 150 mL Inject 2,400 mg/m2 into the vein over 96 hr.     insulin NPH-regular Human (70-30) 100 UNIT/ML injection Inject 10-12 Units into the skin 2 (two) times daily.     LEUCOVORIN CALCIUM IV Inject 400 mg/m2 into the vein every 14 (fourteen) days.     lidocaine-prilocaine (EMLA) cream Apply to affected area once 30 g 3   morphine (MSIR) 15 MG tablet Take 1 tablet (15 mg total) by mouth every 6 (six) hours as needed for severe pain. 60 tablet 0   Multiple Vitamin (MULTIVITAMIN WITH MINERALS) TABS tablet Take 1 tablet by mouth daily.     omeprazole (PRILOSEC) 40 MG capsule Take 1 capsule (40 mg total) by mouth daily. 90 capsule 3   OXALIPLATIN IV Inject 85 mg/m2 into the vein every 14 (fourteen) days.  polyethylene glycol (MIRALAX) 17 g packet Take 17 g by mouth daily. 30 each 3   prochlorperazine (COMPAZINE) 5 MG tablet Take 1 tablet (5 mg total) by mouth every 6 (six) hours as needed for nausea or vomiting. 30 tablet 3   No current facility-administered medications for this visit.   Facility-Administered Medications Ordered in Other Visits  Medication Dose Route Frequency Provider Last Rate Last Admin   fluorouracil (ADRUCIL) 5,000 mg in sodium chloride 0.9 % 150 mL chemo infusion  5,000 mg Intravenous 1 day or 1 dose Alvy Bimler, Nasir Bright, MD       leucovorin 784 mg in dextrose 5 % 250 mL infusion  400 mg/m2 (Treatment Plan Recorded) Intravenous Once Alvy Bimler, Zamya Culhane, MD 145 mL/hr at 01/25/21 1029 784 mg at 01/25/21 1029   oxaliplatin (ELOXATIN)  165 mg in dextrose 5 % 500 mL chemo infusion  85 mg/m2 (Treatment Plan Recorded) Intravenous Once Heath Lark, MD 267 mL/hr at 01/25/21 1027 165 mg at 01/25/21 1027    PHYSICAL EXAMINATION: ECOG PERFORMANCE STATUS: 1 - Symptomatic but completely ambulatory GENERAL:alert, no distress and comfortable NEURO: alert & oriented x 3 with fluent speech, no focal motor/sensory deficits  LABORATORY DATA:  I have reviewed the data as listed    Component Value Date/Time   NA 132 (L) 01/25/2021 0819   K 4.2 01/25/2021 0819   CL 97 (L) 01/25/2021 0819   CO2 27 01/25/2021 0819   GLUCOSE 161 (H) 01/25/2021 0819   BUN 13 01/25/2021 0819   CREATININE 0.72 01/25/2021 0819   CALCIUM 8.6 (L) 01/25/2021 0819   PROT 6.9 01/25/2021 0819   ALBUMIN 2.8 (L) 01/25/2021 0819   AST 67 (H) 01/25/2021 0819   ALT 29 01/25/2021 0819   ALKPHOS 788 (H) 01/25/2021 0819   BILITOT 1.7 (H) 01/25/2021 0819   GFRNONAA >60 01/25/2021 0819    No results found for: SPEP, UPEP  Lab Results  Component Value Date   WBC 12.1 (H) 01/25/2021   NEUTROABS 7.8 (H) 01/25/2021   HGB 11.1 (L) 01/25/2021   HCT 32.6 (L) 01/25/2021   MCV 90.6 01/25/2021   PLT 265 01/25/2021      Chemistry      Component Value Date/Time   NA 132 (L) 01/25/2021 0819   K 4.2 01/25/2021 0819   CL 97 (L) 01/25/2021 0819   CO2 27 01/25/2021 0819   BUN 13 01/25/2021 0819   CREATININE 0.72 01/25/2021 0819      Component Value Date/Time   CALCIUM 8.6 (L) 01/25/2021 0819   ALKPHOS 788 (H) 01/25/2021 0819   AST 67 (H) 01/25/2021 0819   ALT 29 01/25/2021 0819   BILITOT 1.7 (H) 01/25/2021 0819       RADIOGRAPHIC STUDIES: I have personally reviewed the radiological images as listed and agreed with the findings in the report. CT Chest Wo Contrast  Result Date: 12/26/2020 CLINICAL DATA:  Abdominal pain, nausea vomiting. Follow-up to an abnormal abdomen and pelvis CT performed earlier today to assess for malignancy of unknown primary. EXAM: CT  CHEST WITHOUT CONTRAST TECHNIQUE: Multidetector CT imaging of the chest was performed following the standard protocol without IV contrast. COMPARISON:  Current abdomen and pelvis CT. FINDINGS: Cardiovascular: Heart is normal in size and configuration. No pericardial effusion mild left coronary artery calcifications. Great vessels are normal in caliber. No aortic or arch branch vessel atherosclerotic calcifications. Mediastinum/Nodes: Visualized thyroid is unremarkable. No neck base, axillary, mediastinal or hilar masses or enlarged lymph nodes. Trachea and esophagus are  unremarkable. Lungs/Pleura: Several lung nodules. Largest nodule, peripheral right lower lobe, image 96, series 4, 7 mm. Additional nodules: 4 mm nodule, left upper lobe, image 33. 3-4 mm nodule, right upper lobe, image 66. 6 mm nodule, right middle lobe abutting the minor fissure, image 70. Two small calcified granuloma in the right middle lobe. Minor linear atelectasis at the posterior lung bases. Remainder of the lungs is clear. No pleural effusion or pneumothorax. Upper Abdomen: No change compared to abdomen and pelvis CT obtained earlier today. Musculoskeletal: No fracture or acute finding. No osteoblastic or osteolytic lesions. IMPRESSION: 1. Several small lung nodules consistent with metastatic disease. The primary malignancy remains unclear. The liver morphology suggests underlying cirrhosis, and multifocal hepatocellular carcinoma should be included in the differential diagnosis. 2. No acute abnormality in the chest. Electronically Signed   By: Lajean Manes M.D.   On: 12/26/2020 21:26   CT ABDOMEN PELVIS W CONTRAST  Result Date: 12/26/2020 CLINICAL DATA:  60 year old male with abdominal pain. EXAM: CT ABDOMEN AND PELVIS WITH CONTRAST TECHNIQUE: Multidetector CT imaging of the abdomen and pelvis was performed using the standard protocol following bolus administration of intravenous contrast. CONTRAST:  129m OMNIPAQUE IOHEXOL 300 MG/ML   SOLN COMPARISON:  None. FINDINGS: Lower chest: There is a 7 mm nodule in the right lung base, likely metastatic disease. Chest CT is recommended for complete evaluation of the lungs. No intra-abdominal free air. Small ascites. Hepatobiliary: Multiple (greater than 20) hepatic hypodense lesions measuring up to 2.9 x 3.4 cm in the left lobe of the liver consistent with metastatic disease. No intrahepatic biliary ductal dilatation. The gallbladder is unremarkable. Pancreas: Unremarkable. No pancreatic ductal dilatation or surrounding inflammatory changes. Spleen: Normal in size without focal abnormality. Adrenals/Urinary Tract: The adrenal glands unremarkable. Subcentimeter right renal interpolar hypodense lesion is not characterized, likely a cyst. There is no hydronephrosis on either side. There is symmetric enhancement and excretion of contrast by both kidneys. The visualized ureters and urinary bladder appear unremarkable. Stomach/Bowel: There is no bowel obstruction or active inflammation. The appendix is normal. Vascular/Lymphatic: Mild aortoiliac atherosclerotic disease. The IVC is unremarkable. No portal venous gas. Retroperitoneal/aortocaval adenopathy measure up to 16 mm in short axis. There is mild nodularity of the omentum in the right hemiabdomen (42/2). Reproductive: The prostate and seminal vesicles are grossly unremarkable. Other: None Musculoskeletal: Osteopenia with degenerative changes of the spine. No acute osseous pathology. IMPRESSION: 1. Extensive hepatic metastatic disease and retroperitoneal adenopathy. 2. A 7 mm right lung base nodule, most consistent with metastatic disease. 3. No bowel obstruction. Normal appendix. 4. Aortic Atherosclerosis (ICD10-I70.0). Electronically Signed   By: AAnner CreteM.D.   On: 12/26/2020 20:45   [place]OPSY (LIVER)  Result Date: 01/10/2021 INDICATION: 60year old male referred for biopsy of liver lesion EXAM: ULTRASOUND-GUIDED LIVER LESION BIOPSY  MEDICATIONS: None. ANESTHESIA/SEDATION: Moderate (conscious) sedation was employed during this procedure. A total of Versed 1.0 mg and Fentanyl 50 mcg was administered intravenously. Moderate Sedation Time: 11 minutes. The patient's level of consciousness and vital signs were monitored continuously by radiology nursing throughout the procedure under my direct supervision. FLUOROSCOPY TIME:  Ultrasound COMPLICATIONS: None PROCEDURE: Informed written consent was obtained from the patient after a thorough discussion of the procedural risks, benefits and alternatives. All questions were addressed. Maximal Sterile Barrier Technique was utilized including caps, mask, sterile gowns, sterile gloves, sterile drape, hand hygiene and skin antiseptic. A timeout was performed prior to the initiation of the procedure. Ultrasound survey of the bilateral liver lobe  performed with images stored and sent to PACs. The sub xiphoid region was prepped with chlorhexidine in a sterile fashion, and a sterile drape was applied covering the operative field. A sterile gown and sterile gloves were used for the procedure. Local anesthesia was provided with 1% Lidocaine. The patient was prepped and draped sterilely and the skin and subcutaneous tissues were generously infiltrated with 1% lidocaine. A 17 gauge introducer needle was then advanced under ultrasound guidance in the subxiphoid location into the left liver lobe, targeting heterogeneously hypoechoic lesion. The stylet was removed, and multiple separate 18 gauge core biopsy were retrieved. Samples were placed into formalin for transportation to the lab. Gel-Foam pledgets were then infused with a small amount of saline for assistance with hemostasis. The needle was removed, and a final ultrasound image was performed. The patient tolerated the procedure well and remained hemodynamically stable throughout. No complications were encountered and no significant blood loss was encounter.  IMPRESSION: Status post ultrasound-guided biopsy of left liver mass. Signed, Dulcy Fanny. Dellia Nims, RPVI [facility] Electronically Signed   By: Corrie Mckusick D.O.   On: 01/10/2021 17:01   IR IMAGING GUIDED PORT INSERTION  Result Date: 01/24/2021 INDICATION: 60 year old male with colon cancer, requiring chemotherapy EXAM: IMPLANTED PORT A CATH PLACEMENT WITH ULTRASOUND AND FLUOROSCOPIC GUIDANCE MEDICATIONS: None.; ANESTHESIA/SEDATION: Moderate (conscious) sedation was employed during this procedure. A total of Versed 3 mg and Fentanyl 100 mcg was administered intravenously. Moderate Sedation Time: 30 minutes. The patient's level of consciousness and vital signs were monitored continuously by radiology nursing throughout the procedure under my direct supervision. FLUOROSCOPY TIME:  0 minutes, 30 seconds (2 mGy) COMPLICATIONS: None immediate. PROCEDURE: The procedure, risks, benefits, and alternatives were explained to the patient. Questions regarding the procedure were encouraged and answered. The patient understands and consents to the procedure. The right neck and chest were prepped with chlorhexidine in a sterile fashion, and a sterile drape was applied covering the operative field. Maximum barrier sterile technique with sterile gowns and gloves were used for the procedure. A timeout was performed prior to the initiation of the procedure. Local anesthesia was provided with 1% lidocaine with epinephrine. After creating a small venotomy incision, a micropuncture kit was utilized to access the internal jugular vein under direct, real-time ultrasound guidance. Ultrasound image documentation was performed. The microwire was kinked to measure appropriate catheter length. A subcutaneous port pocket was then created along the upper chest wall utilizing a combination of sharp and blunt dissection. The pocket was irrigated with sterile saline. A single lumen ISP  power injectable port was chosen for placement. The 8 Fr catheter was tunneled from the port pocket site to the venotomy incision. The port was placed in the pocket. The external catheter was trimmed to appropriate length. At the venotomy, an 8 Fr peel-away sheath was placed over a guidewire under fluoroscopic guidance. The catheter was then placed through the sheath and the sheath was removed. Final catheter positioning was confirmed and documented with a fluoroscopic spot radiograph. The port was accessed with a Huber needle, aspirated and flushed with heparinized saline. The venotomy site was closed with an interrupted 3-0 Vicryl suture. The port pocket incision was closed with interrupted 3-0 Vicryl suture and Dermabond and was applied to both incisions. Dressings were placed. The patient tolerated the procedure well without immediate post procedural complication. IMPRESSION: Successful placement of a right internal jugular approach power injectable Port-A-Cath. The catheter is ready for immediate use. Electronically  Signed   By: Michaelle Birks MD   On: 01/24/2021 16:10

## 2021-01-25 NOTE — Progress Notes (Signed)
Patients port flushed without difficulty.  Good blood return noted with no bruising or swelling noted at site.  Stable during access and blood draw.  Patient to remain accessed for treatment. 

## 2021-01-25 NOTE — Assessment & Plan Note (Signed)
He has abnormal liver enzymes, stable We will proceed with chemotherapy as scheduled

## 2021-01-25 NOTE — Assessment & Plan Note (Signed)
He had recent sedation and nausea with morphine but the morphine sulfate is controlling his pain I recommend him to take antiemetics as needed For now, I encouraged him to continue taking morphine sulfate rather than switching to a different type I will reassess pain control next week

## 2021-01-26 ENCOUNTER — Encounter (HOSPITAL_COMMUNITY): Payer: Self-pay | Admitting: General Practice

## 2021-01-26 LAB — CEA: CEA: 1378 ng/mL — ABNORMAL HIGH (ref 0.0–4.7)

## 2021-01-26 NOTE — Progress Notes (Signed)
[facility] CSW Progress Notes  Call to patient.  He has appt scheduled w [place] to get their help w disability application.  He went to [employer] this morning and received food from food pantry.  Messaged [employer] to ensure they are going to connect w him re Medicaid application.  Per Financial Advocate Yehuda [place], she has made the referral to them so he can get this help.  Edwyna Shell, LCSW Clinical Social Worker Phone:  [phone]

## 2021-01-27 ENCOUNTER — Inpatient Hospital Stay (HOSPITAL_COMMUNITY): Payer: Medicaid Other

## 2021-01-27 ENCOUNTER — Other Ambulatory Visit: Payer: Self-pay

## 2021-01-27 VITALS — BP 91/62 | HR 64 | Temp 98.1°F | Resp 18

## 2021-01-27 DIAGNOSIS — Z5111 Encounter for antineoplastic chemotherapy: Secondary | ICD-10-CM | POA: Diagnosis not present

## 2021-01-27 DIAGNOSIS — C787 Secondary malignant neoplasm of liver and intrahepatic bile duct: Secondary | ICD-10-CM

## 2021-01-27 DIAGNOSIS — C189 Malignant neoplasm of colon, unspecified: Secondary | ICD-10-CM

## 2021-01-27 MED ORDER — HEPARIN SOD (PORK) LOCK FLUSH 100 UNIT/ML IV SOLN
500.0000 [IU] | Freq: Once | INTRAVENOUS | Status: AC | PRN
  Administered 2021-01-27: 500 [IU]

## 2021-01-27 MED ORDER — SODIUM CHLORIDE 0.9% FLUSH
10.0000 mL | INTRAVENOUS | Status: DC | PRN
  Administered 2021-01-27: 10 mL

## 2021-01-27 NOTE — Progress Notes (Signed)
Patients port flushed without difficulty.  Good blood return noted with no bruising or swelling noted at site.  Band aid applied.  VSS with discharge and left in satisfactory condition with no s/s of distress noted.   

## 2021-01-27 NOTE — Patient Instructions (Signed)
[facility]  Discharge Instructions: Thank you for choosing [facility] to provide your oncology and hematology care.  If you have a lab appointment with the Cancer Center, please come in thru the Main Entrance and check in at the main information desk.  Wear comfortable clothing and clothing appropriate for easy access to any Portacath or PICC line.   We strive to give you quality time with your provider. You may need to reschedule your appointment if you arrive late (15 or more minutes).  Arriving late affects you and other patients whose appointments are after yours.  Also, if you miss three or more appointments without notifying the office, you may be dismissed from the clinic at the provider's discretion.      For prescription refill requests, have your pharmacy contact our office and allow 72 hours for refills to be completed.        To help prevent nausea and vomiting after your treatment, we encourage you to take your nausea medication as directed.  BELOW ARE SYMPTOMS THAT SHOULD BE REPORTED IMMEDIATELY: *FEVER GREATER THAN 100.4 F (38 C) OR HIGHER *CHILLS OR SWEATING *NAUSEA AND VOMITING THAT IS NOT CONTROLLED WITH YOUR NAUSEA MEDICATION *UNUSUAL SHORTNESS OF BREATH *UNUSUAL BRUISING OR BLEEDING *URINARY PROBLEMS (pain or burning when urinating, or frequent urination) *BOWEL PROBLEMS (unusual diarrhea, constipation, pain near the anus) TENDERNESS IN MOUTH AND THROAT WITH OR WITHOUT PRESENCE OF ULCERS (sore throat, sores in mouth, or a toothache) UNUSUAL RASH, SWELLING OR PAIN  UNUSUAL VAGINAL DISCHARGE OR ITCHING   Items with * indicate a potential emergency and should be followed up as soon as possible or go to the Emergency Department if any problems should occur.  Please show the CHEMOTHERAPY ALERT CARD or IMMUNOTHERAPY ALERT CARD at check-in to the Emergency Department and triage nurse.  Should you have questions after your visit or need to cancel  or reschedule your appointment, please contact [facility] [phone]  and follow the prompts.  Office hours are 8:00 a.m. to 4:30 p.m. Monday - Friday. Please note that voicemails left after 4:00 p.m. may not be returned until the following business day.  We are closed weekends and major holidays. You have access to a nurse at all times for urgent questions. Please call the main number to the clinic [phone] and follow the prompts.  For any non-urgent questions, you may also contact your provider using MyChart. We now offer e-Visits for anyone 18 and older to request care online for non-urgent symptoms. For details visit [email or web address].   Also download the MyChart app! Go to the app store, search "MyChart", open the app, select [facility], and log in with your MyChart username and password.  Due to Covid, a mask is required upon entering the hospital/clinic. If you do not have a mask, one will be given to you upon arrival. For doctor visits, patients may have 1 support person aged 18 or older with them. For treatment visits, patients cannot have anyone with them due to current Covid guidelines and our immunocompromised population.  

## 2021-01-30 ENCOUNTER — Telehealth (HOSPITAL_COMMUNITY): Payer: Self-pay | Admitting: Dietician

## 2021-01-30 ENCOUNTER — Encounter (HOSPITAL_COMMUNITY): Payer: Self-pay | Admitting: Hematology and Oncology

## 2021-01-30 NOTE — Telephone Encounter (Signed)
Nutrition  Patient identified on MST (+weight loss, poor appetite)  Attempted to contact patient via telephone to introduce self and service available at [facility]. Patient did not answer. Message left with request for return call. Contact information provided.

## 2021-02-01 ENCOUNTER — Other Ambulatory Visit: Payer: Self-pay

## 2021-02-01 ENCOUNTER — Inpatient Hospital Stay (HOSPITAL_COMMUNITY): Payer: Medicaid Other

## 2021-02-01 ENCOUNTER — Telehealth (HOSPITAL_COMMUNITY): Payer: Self-pay

## 2021-02-01 ENCOUNTER — Inpatient Hospital Stay (HOSPITAL_BASED_OUTPATIENT_CLINIC_OR_DEPARTMENT_OTHER): Payer: Medicaid Other | Admitting: Hematology and Oncology

## 2021-02-01 ENCOUNTER — Encounter (HOSPITAL_COMMUNITY): Payer: Self-pay | Admitting: Hematology and Oncology

## 2021-02-01 DIAGNOSIS — C787 Secondary malignant neoplasm of liver and intrahepatic bile duct: Secondary | ICD-10-CM

## 2021-02-01 DIAGNOSIS — G893 Neoplasm related pain (acute) (chronic): Secondary | ICD-10-CM

## 2021-02-01 DIAGNOSIS — Z5111 Encounter for antineoplastic chemotherapy: Secondary | ICD-10-CM | POA: Diagnosis not present

## 2021-02-01 DIAGNOSIS — C189 Malignant neoplasm of colon, unspecified: Secondary | ICD-10-CM

## 2021-02-01 LAB — CBC WITH DIFFERENTIAL/PLATELET
Abs Immature Granulocytes: 0.04 10*3/uL (ref 0.00–0.07)
Basophils Absolute: 0.1 10*3/uL (ref 0.0–0.1)
Basophils Relative: 1 %
Eosinophils Absolute: 0.7 10*3/uL — ABNORMAL HIGH (ref 0.0–0.5)
Eosinophils Relative: 8 %
HCT: 31.5 % — ABNORMAL LOW (ref 39.0–52.0)
Hemoglobin: 10.7 g/dL — ABNORMAL LOW (ref 13.0–17.0)
Immature Granulocytes: 1 %
Lymphocytes Relative: 15 %
Lymphs Abs: 1.3 10*3/uL (ref 0.7–4.0)
MCH: 30.8 pg (ref 26.0–34.0)
MCHC: 34 g/dL (ref 30.0–36.0)
MCV: 90.8 fL (ref 80.0–100.0)
Monocytes Absolute: 0.6 10*3/uL (ref 0.1–1.0)
Monocytes Relative: 7 %
Neutro Abs: 6 10*3/uL (ref 1.7–7.7)
Neutrophils Relative %: 68 %
Platelets: 257 10*3/uL (ref 150–400)
RBC: 3.47 MIL/uL — ABNORMAL LOW (ref 4.22–5.81)
RDW: 14.9 % (ref 11.5–15.5)
WBC: 8.7 10*3/uL (ref 4.0–10.5)
nRBC: 0 % (ref 0.0–0.2)

## 2021-02-01 LAB — COMPREHENSIVE METABOLIC PANEL
ALT: 41 U/L (ref 0–44)
AST: 70 U/L — ABNORMAL HIGH (ref 15–41)
Albumin: 2.8 g/dL — ABNORMAL LOW (ref 3.5–5.0)
Alkaline Phosphatase: 895 U/L — ABNORMAL HIGH (ref 38–126)
Anion gap: 6 (ref 5–15)
BUN: 12 mg/dL (ref 6–20)
CO2: 28 mmol/L (ref 22–32)
Calcium: 8.7 mg/dL — ABNORMAL LOW (ref 8.9–10.3)
Chloride: 98 mmol/L (ref 98–111)
Creatinine, Ser: 0.58 mg/dL — ABNORMAL LOW (ref 0.61–1.24)
GFR, Estimated: >60 mL/min (ref >60)
Glucose, Bld: 191 mg/dL — ABNORMAL HIGH (ref 70–99)
Potassium: 4.3 mmol/L (ref 3.5–5.1)
Sodium: 132 mmol/L — ABNORMAL LOW (ref 135–145)
Total Bilirubin: 1.9 mg/dL — ABNORMAL HIGH (ref 0.3–1.2)
Total Protein: 6.7 g/dL (ref 6.5–8.1)

## 2021-02-01 LAB — MAGNESIUM: Magnesium: 1.9 mg/dL (ref 1.7–2.4)

## 2021-02-01 MED ORDER — HEPARIN SOD (PORK) LOCK FLUSH 100 UNIT/ML IV SOLN
500.0000 [IU] | Freq: Once | INTRAVENOUS | Status: AC
  Administered 2021-02-01: 500 [IU] via INTRAVENOUS

## 2021-02-01 MED ORDER — SODIUM CHLORIDE 0.9% FLUSH
10.0000 mL | Freq: Once | INTRAVENOUS | Status: AC
  Administered 2021-02-01: 10 mL via INTRAVENOUS

## 2021-02-01 NOTE — Assessment & Plan Note (Signed)
Overall, he tolerated treatment well except for fatigue and persistent poor appetite He actually started feeling better today His blood sugar is noted to be low at home and I recommend reducing the dose of insulin by half, gradually to be increased depending on his future oral intake I encouraged the patient to continue eating frequent small meals His pain is less and I am encouraged to hear that He will return next week for cycle 2 of chemo

## 2021-02-01 NOTE — Assessment & Plan Note (Signed)
His liver enzymes are slightly worse likely due to recent chemotherapy Observe closely

## 2021-02-01 NOTE — Telephone Encounter (Signed)
Late entry- called patient on 01-31-21 for his 24 hour follow up, unable to reach patient , left message for patient to call for any concerns or questions.

## 2021-02-01 NOTE — Addendum Note (Signed)
Addended by: Donnie Aho on: 02/01/2021 11:38 AM   Modules accepted: Orders

## 2021-02-01 NOTE — Progress Notes (Signed)
[place] OFFICE PROGRESS NOTE  Patient Care Team: Lucia Gaskins, MD as PCP - General (Internal Medicine) Gala Romney, Cristopher Estimable, MD as Consulting Physician (Gastroenterology) Brien Mates, RN as Oncology Nurse Navigator (Oncology)  ASSESSMENT & PLAN:  Colon cancer metastasized to liver [facility]) Overall, he tolerated treatment well except for fatigue and persistent poor appetite He actually started feeling better today His blood sugar is noted to be low at home and I recommend reducing the dose of insulin by half, gradually to be increased depending on his future oral intake I encouraged the patient to continue eating frequent small meals His pain is less and I am encouraged to hear that He will return next week for cycle 2 of chemo  Cancer associated pain He has less pain It is likely that he has positive response to treatment He does not need pain medication refill  Metastasis to liver [facility]) His liver enzymes are slightly worse likely due to recent chemotherapy Observe closely  No orders of the defined types were placed in this encounter.   All questions were answered. The patient knows to call the clinic with any problems, questions or concerns. The total time spent in the appointment was 25 minutes encounter with patients including review of chart and various tests results, discussions about plan of care and coordination of care plan   Heath Lark, MD 02/01/2021 1:14 PM  INTERVAL HISTORY: Please see below for problem oriented charting. He returns with his brother for further follow-up He tolerated cycle 1 of treatment well except for excessive fatigue He denies nausea, vomiting or changes in bowel habits He felt that his pain is better and only take 1 pain medicine per day His blood sugar has been noted to be low His blood pressure is low today He admits that he might not be drinking enough liquids   SUMMARY OF ONCOLOGIC HISTORY: Oncology History  Overview Note  MMR normal Foundation One: low mutation burden   Metastasis to liver ([place])  01/04/2021 Initial Diagnosis   Metastasis to liver ([place])    01/25/2021 -  Chemotherapy    Patient is on Treatment Plan: COLORECTAL FOLFOX Q14D       Colon cancer metastasized to liver ([place])  12/26/2020 Imaging   Ct abdomen and pelvis 1. Extensive hepatic metastatic disease and retroperitoneal adenopathy. 2. A 7 mm right lung base nodule, most consistent with metastatic disease. 3. No bowel obstruction. Normal appendix. 4. Aortic Atherosclerosis (ICD10-I70.0).   12/26/2020 Imaging   CT chest 1. Several small lung nodules consistent with metastatic disease. The primary malignancy remains unclear. The liver morphology suggests underlying cirrhosis, and multifocal hepatocellular carcinoma should be included in the differential diagnosis. 2. No acute abnormality in the chest.     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 LIVER, NEEDLE CORE BIOPSY:  -  Metastatic adenocarcinoma  -  See comment   COMMENT:   The neoplastic cells are positive for cytokeratin 20 and CDX2 but negative for cytokeratin 7, TTF-1, PSA and prostein.  The combined morphology and immunophenotype are consistent with metastasis from a  gastrointestinal primary.  Dr. Saralyn Pilar reviewed the case and agrees with the above diagnosis.     01/13/2021 Procedure   Colonoscopy - Two 3 to 5 mm polyps in the transverse colon, removed with a cold snare. Resected and retrieved. - Malignant partially obstructing tumor at 17 cm proximal to the anus. Biopsied. Tattooed. - The  distal rectum and anal verge are normal on retroflexion view.   01/13/2021 Cancer Staging   Staging form: Colon and Rectum, AJCC 8th Edition - Clinical stage from 01/13/2021: Stage IVB (cTX, cN0, pM1b) - Signed by Heath Lark, MD on 01/13/2021  Stage prefix: Initial diagnosis  Total positive nodes: 0     01/13/2021 Procedure   Colonoscopy - Two 3 to 5 mm polyps in the transverse colon, removed with a cold snare. Resected and retrieved. - Malignant partially obstructing tumor at 17 cm proximal to the anus. Biopsied. Tattooed. - The distal rectum and anal verge are normal on retroflexion view.   01/13/2021 Procedure   EGD - Normal esophagus. - Z-line regular, 40 cm from the incisors. - Normal stomach. - Nodular mucosa in   01/13/2021 Pathology Results   A. DUODENUM, NODULE, BIOPSY:  - Peptic duodenitis.  - No dysplasia or malignancy.   B. COLON, SIGMOID, MASS, BIOPSY:  - Invasive adenocarcinoma.   C. COLON, TRANSVERSE, POLYPECTOMY:  - Fragment of ulcerated tissue with adenocarcinoma, see comment.  - Tubular adenoma (X2 fragments).   COMMENT:   B. MMR will be ordered   01/13/2021 Tumor Marker   Patient's tumor was tested for the following markers: CEA. Results of the tumor marker test revealed [id number].   01/24/2021 Procedure   Successful right-sided port placement, with the tip of the catheter in the proximal right atrium.   Plan: Catheter ready for use.   See detailed procedure note with images in PACS. The patient tolerated the procedure well without incident or complication and was returned to Recovery in stable conditi   01/25/2021 -  Chemotherapy    Patient is on Treatment Plan: COLORECTAL FOLFOX Q14D       01/25/2021 Tumor Marker   Patient's tumor was tested for the following markers: CEA. Results of the tumor marker test revealed 1378.     REVIEW OF SYSTEMS:   Constitutional: Denies fevers, chills or abnormal weight loss Eyes: Denies blurriness of vision Ears, nose, mouth, throat, and face: Denies mucositis or sore throat Respiratory: Denies cough, dyspnea or wheezes Cardiovascular: Denies palpitation, chest discomfort or lower extremity swelling Gastrointestinal:  Denies nausea, heartburn or change in bowel habits Skin: Denies abnormal skin rashes Lymphatics:  Denies new lymphadenopathy or easy bruising Behavioral/Psych: Mood is stable, no new changes  All other systems were reviewed with the patient and are negative.  I have reviewed the past medical history, past surgical history, social history and family history with the patient and they are unchanged from previous note.  ALLERGIES:  has No Known Allergies.  MEDICATIONS:  Current Outpatient Medications  Medication Sig Dispense Refill   acetaminophen (TYLENOL) 500 MG tablet Take 1,000 mg by mouth every 6 (six) hours as needed for moderate pain.     atenolol (TENORMIN) 50 MG tablet Take 50 mg by mouth daily.     fluorouracil CALGB 75170 2,400 mg/m2 in sodium chloride 0.9 % 150 mL Inject 2,400 mg/m2 into the vein over 96 hr.     insulin NPH-regular Human (70-30) 100 UNIT/ML injection Inject 10-12 Units into the skin 2 (two) times daily.     LEUCOVORIN CALCIUM IV Inject 400 mg/m2 into the vein every 14 (fourteen) days.     lidocaine-prilocaine (EMLA) cream Apply to affected area once 30 g 3   morphine (MSIR) 15 MG tablet Take 1 tablet (15 mg total) by mouth every 6 (six) hours as needed for severe pain. 60 tablet 0   Multiple Vitamin (  MULTIVITAMIN WITH MINERALS) TABS tablet Take 1 tablet by mouth daily.     omeprazole (PRILOSEC) 40 MG capsule Take 1 capsule (40 mg total) by mouth daily. 90 capsule 3   ondansetron (ZOFRAN) 8 MG tablet Take 1 tablet (8 mg total) by mouth every 8 (eight) hours as needed for nausea. 30 tablet 3   OXALIPLATIN IV Inject 85 mg/m2 into the vein every 14 (fourteen) days.     polyethylene glycol (MIRALAX) 17 g packet Take 17 g by mouth daily. 30 each 3   prochlorperazine (COMPAZINE) 5 MG tablet Take 1 tablet (5 mg total) by mouth every 6 (six) hours as needed for nausea or vomiting. 30 tablet 3   No current facility-administered medications for this visit.    PHYSICAL EXAMINATION: ECOG PERFORMANCE STATUS: 1 - Symptomatic but completely ambulatory  Vitals:   02/01/21  1052  BP: 106/63  Pulse: 84  Resp: 18  Temp: (!) 96.8 F (36 C)  SpO2: 99%   Filed Weights   02/01/21 1052  Weight: 172 lb 6.4 oz (78.2 kg)    GENERAL:alert, no distress and comfortable SKIN: skin color, texture, turgor are normal, no rashes or significant lesions EYES: normal, Conjunctiva are pink and non-injected, sclera clear OROPHARYNX:no exudate, no erythema and lips, buccal mucosa, and tongue normal  NECK: supple, thyroid normal size, non-tender, without nodularity LYMPH:  no palpable lymphadenopathy in the cervical, axillary or inguinal LUNGS: clear to auscultation and percussion with normal breathing effort HEART: regular rate & rhythm and no murmurs and no lower extremity edema ABDOMEN:abdomen soft, non-tender and normal bowel sounds Musculoskeletal:no cyanosis of digits and no clubbing  NEURO: alert & oriented x 3 with fluent speech, no focal motor/sensory deficits  LABORATORY DATA:  I have reviewed the data as listed    Component Value Date/Time   NA 132 (L) 02/01/2021 1104   K 4.3 02/01/2021 1104   CL 98 02/01/2021 1104   CO2 28 02/01/2021 1104   GLUCOSE 191 (H) 02/01/2021 1104   BUN 12 02/01/2021 1104   CREATININE 0.58 (L) 02/01/2021 1104   CALCIUM 8.7 (L) 02/01/2021 1104   PROT 6.7 02/01/2021 1104   ALBUMIN 2.8 (L) 02/01/2021 1104   AST 70 (H) 02/01/2021 1104   ALT 41 02/01/2021 1104   ALKPHOS 895 (H) 02/01/2021 1104   BILITOT 1.9 (H) 02/01/2021 1104   GFRNONAA >60 02/01/2021 1104    No results found for: SPEP, UPEP  Lab Results  Component Value Date   WBC 8.7 02/01/2021   NEUTROABS 6.0 02/01/2021   HGB 10.7 (L) 02/01/2021   HCT 31.5 (L) 02/01/2021   MCV 90.8 02/01/2021   PLT 257 02/01/2021      Chemistry      Component Value Date/Time   NA 132 (L) 02/01/2021 1104   K 4.3 02/01/2021 1104   CL 98 02/01/2021 1104   CO2 28 02/01/2021 1104   BUN 12 02/01/2021 1104   CREATININE 0.58 (L) 02/01/2021 1104      Component Value Date/Time    CALCIUM 8.7 (L) 02/01/2021 1104   ALKPHOS 895 (H) 02/01/2021 1104   AST 70 (H) 02/01/2021 1104   ALT 41 02/01/2021 1104   BILITOT 1.9 (H) 02/01/2021 1104       RADIOGRAPHIC STUDIES: I have personally reviewed the radiological images as listed and agreed with the findings in the report. US BIOPSY (LIVER)  Result Date: 01/10/2021 INDICATION: 60 year old male referred for biopsy of liver lesion EXAM: ULTRASOUND-GUIDED LIVER LESION BIOPSY MEDICATIONS:  None. ANESTHESIA/SEDATION: Moderate (conscious) sedation was employed during this procedure. A total of Versed 1.0 mg and Fentanyl 50 mcg was administered intravenously. Moderate Sedation Time: 11 minutes. The patient's level of consciousness and vital signs were monitored continuously by radiology nursing throughout the procedure under my direct supervision. FLUOROSCOPY TIME:  Ultrasound COMPLICATIONS: None PROCEDURE: Informed written consent was obtained from the patient after a thorough discussion of the procedural risks, benefits and alternatives. All questions were addressed. Maximal Sterile Barrier Technique was utilized including caps, mask, sterile gowns, sterile gloves, sterile drape, hand hygiene and skin antiseptic. A timeout was performed prior to the initiation of the procedure. Ultrasound survey of the bilateral liver lobe performed with images stored and sent to PACs. The sub xiphoid region was prepped with chlorhexidine in a sterile fashion, and a sterile drape was applied covering the operative field. A sterile gown and sterile gloves were used for the procedure. Local anesthesia was provided with 1% Lidocaine. The patient was prepped and draped sterilely and the skin and subcutaneous tissues were generously infiltrated with 1% lidocaine. A 17 gauge introducer needle was then advanced under ultrasound guidance in the subxiphoid location into the left liver lobe, targeting heterogeneously hypoechoic lesion. The stylet was removed, and multiple  separate 18 gauge core biopsy were retrieved. Samples were placed into formalin for transportation to the lab. Gel-Foam pledgets were then infused with a small amount of saline for assistance with hemostasis. The needle was removed, and a final ultrasound image was performed. The patient tolerated the procedure well and remained hemodynamically stable throughout. No complications were encountered and no significant blood loss was encounter. IMPRESSION: Status post ultrasound-guided biopsy of left liver mass. Signed, Dulcy Fanny. Dellia Nims, RPVI [facility]iology Electronically Signed   By: Corrie Mckusick D.O.   On: 01/10/2021 17:01   IR IMAGING GUIDED PORT INSERTION  Result Date: 01/24/2021 INDICATION: 60 year old male with colon cancer, requiring chemotherapy EXAM: IMPLANTED PORT A CATH PLACEMENT WITH ULTRASOUND AND FLUOROSCOPIC GUIDANCE MEDICATIONS: None.; ANESTHESIA/SEDATION: Moderate (conscious) sedation was employed during this procedure. A total of Versed 3 mg and Fentanyl 100 mcg was administered intravenously. Moderate Sedation Time: 30 minutes. The patient's level of consciousness and vital signs were monitored continuously by radiology nursing throughout the procedure under my direct supervision. FLUOROSCOPY TIME:  0 minutes, 30 seconds (2 mGy) COMPLICATIONS: None immediate. PROCEDURE: The procedure, risks, benefits, and alternatives were explained to the patient. Questions regarding the procedure were encouraged and answered. The patient understands and consents to the procedure. The right neck and chest were prepped with chlorhexidine in a sterile fashion, and a sterile drape was applied covering the operative field. Maximum barrier sterile technique with sterile gowns and gloves were used for the procedure. A timeout was performed prior to the initiation of the procedure. Local anesthesia was provided with 1% lidocaine with epinephrine. After creating  a small venotomy incision, a micropuncture kit was utilized to access the internal jugular vein under direct, real-time ultrasound guidance. Ultrasound image documentation was performed. The microwire was kinked to measure appropriate catheter length. A subcutaneous port pocket was then created along the upper chest wall utilizing a combination of sharp and blunt dissection. The pocket was irrigated with sterile saline. A single lumen ISP power injectable port was chosen for placement. The 8 Fr catheter was tunneled from the port pocket site to the venotomy incision. The port was placed in the pocket. The external catheter was trimmed to appropriate length. At the  venotomy, an 8 Fr peel-away sheath was placed over a guidewire under fluoroscopic guidance. The catheter was then placed through the sheath and the sheath was removed. Final catheter positioning was confirmed and documented with a fluoroscopic spot radiograph. The port was accessed with a Huber needle, aspirated and flushed with heparinized saline. The venotomy site was closed with an interrupted 3-0 Vicryl suture. The port pocket incision was closed with interrupted 3-0 Vicryl suture and Dermabond and was applied to both incisions. Dressings were placed. The patient tolerated the procedure well without immediate post procedural complication. IMPRESSION: Successful placement of a right internal jugular approach power injectable Port-A-Cath. The catheter is ready for immediate use. Electronically Signed   By: Michaelle Birks MD   On: 01/24/2021 16:10

## 2021-02-01 NOTE — Assessment & Plan Note (Addendum)
He has less pain It is likely that he has positive response to treatment He does not need pain medication refill

## 2021-02-06 ENCOUNTER — Inpatient Hospital Stay (HOSPITAL_COMMUNITY): Payer: Medicaid Other | Attending: Hematology | Admitting: General Practice

## 2021-02-06 ENCOUNTER — Telehealth (HOSPITAL_COMMUNITY): Payer: Self-pay | Admitting: Hematology

## 2021-02-06 DIAGNOSIS — C787 Secondary malignant neoplasm of liver and intrahepatic bile duct: Secondary | ICD-10-CM

## 2021-02-06 DIAGNOSIS — Z5112 Encounter for antineoplastic immunotherapy: Secondary | ICD-10-CM | POA: Insufficient documentation

## 2021-02-06 DIAGNOSIS — Z79899 Other long term (current) drug therapy: Secondary | ICD-10-CM | POA: Insufficient documentation

## 2021-02-06 DIAGNOSIS — C7802 Secondary malignant neoplasm of left lung: Secondary | ICD-10-CM | POA: Insufficient documentation

## 2021-02-06 DIAGNOSIS — C187 Malignant neoplasm of sigmoid colon: Secondary | ICD-10-CM | POA: Insufficient documentation

## 2021-02-06 DIAGNOSIS — C7801 Secondary malignant neoplasm of right lung: Secondary | ICD-10-CM | POA: Insufficient documentation

## 2021-02-06 DIAGNOSIS — Z5111 Encounter for antineoplastic chemotherapy: Secondary | ICD-10-CM | POA: Insufficient documentation

## 2021-02-06 DIAGNOSIS — E119 Type 2 diabetes mellitus without complications: Secondary | ICD-10-CM | POA: Insufficient documentation

## 2021-02-06 NOTE — Progress Notes (Addendum)
[place] CSW Progress Notes  Call to patient to follow up - he states he has not heard from [facility] re disability nor from [facility] re Medicaid application.  Emailed [employer] to inquire re status of his disability application process and spoke w Management consultant to track assignment of account for Medicaid application assistance.  Will update patient when more information is available. Resubmitted [facility] application as facility states they did not receive it.    Edwyna Shell, LCSW Clinical Social Worker Phone:  [phone]

## 2021-02-06 NOTE — Telephone Encounter (Signed)
Spoke to pt today, advised him of our [facility] assist program. He is eligible, and will have his brother wright a letter of support for him. Pt has no income coming in. Pt will bring letter of support on 8/3.

## 2021-02-07 ENCOUNTER — Other Ambulatory Visit (HOSPITAL_COMMUNITY): Payer: Self-pay | Admitting: General Practice

## 2021-02-07 ENCOUNTER — Encounter (HOSPITAL_COMMUNITY): Payer: Self-pay

## 2021-02-07 NOTE — Progress Notes (Signed)
[facility] [address], [place] [address]   CLINIC:  Medical Oncology/Hematology  PCP:  Lucia Gaskins, MD [place] / [place] [address] [phone]   REASON FOR VISIT:  Follow-up for colon caner metastasized to liver  PRIOR THERAPY: none  NGS Results: not done  CURRENT THERAPY: FOLFOX q14d  BRIEF ONCOLOGIC HISTORY:  Oncology History Overview Note  MMR normal Foundation One: low mutation burden   Metastasis to liver ([place])  01/04/2021 Initial Diagnosis   Metastasis to liver ([place])    01/25/2021 -  Chemotherapy    Patient is on Treatment Plan: COLORECTAL FOLFOX Q14D       Colon cancer metastasized to liver ([place])  12/26/2020 Imaging   Ct abdomen and pelvis 1. Extensive hepatic metastatic disease and retroperitoneal adenopathy. 2. A 7 mm right lung base nodule, most consistent with metastatic disease. 3. No bowel obstruction. Normal appendix. 4. Aortic Atherosclerosis (ICD10-I70.0).   12/26/2020 Imaging   CT chest 1. Several small lung nodules consistent with metastatic disease. The primary malignancy remains unclear. The liver morphology suggests underlying cirrhosis, and multifocal hepatocellular carcinoma should be included in the differential diagnosis. 2. No acute abnormality in the chest.     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 LIVER, NEEDLE CORE BIOPSY:  -  Metastatic adenocarcinoma  -  See comment   COMMENT:   The neoplastic cells are positive for cytokeratin 20 and CDX2 but negative for cytokeratin 7, TTF-1, PSA and prostein.  The combined morphology and immunophenotype are consistent with metastasis from a  gastrointestinal primary.  Dr. Saralyn Pilar reviewed the case and agrees with the above diagnosis.     01/13/2021 Procedure   Colonoscopy - Two 3 to 5 mm polyps in the transverse colon, removed with a cold snare. Resected and retrieved. - Malignant  partially obstructing tumor at 17 cm proximal to the anus. Biopsied. Tattooed. - The distal rectum and anal verge are normal on retroflexion view.   01/13/2021 Cancer Staging   Staging form: Colon and Rectum, AJCC 8th Edition - Clinical stage from 01/13/2021: Stage IVB (cTX, cN0, pM1b) - Signed by Heath Lark, MD on 01/13/2021  Stage prefix: Initial diagnosis  Total positive nodes: 0    01/13/2021 Procedure   Colonoscopy - Two 3 to 5 mm polyps in the transverse colon, removed with a cold snare. Resected and retrieved. - Malignant partially obstructing tumor at 17 cm proximal to the anus. Biopsied. Tattooed. - The distal rectum and anal verge are normal on retroflexion view.   01/13/2021 Procedure   EGD - Normal esophagus. - Z-line regular, 40 cm from the incisors. - Normal stomach. - Nodular mucosa in   01/13/2021 Pathology Results   A. DUODENUM, NODULE, BIOPSY:  - Peptic duodenitis.  - No dysplasia or malignancy.   B. COLON, SIGMOID, MASS, BIOPSY:  - Invasive adenocarcinoma.   C. COLON, TRANSVERSE, POLYPECTOMY:  - Fragment of ulcerated tissue with adenocarcinoma, see comment.  - Tubular adenoma (X2 fragments).   COMMENT:   B. MMR will be ordered   01/13/2021 Tumor Marker   Patient's tumor was tested for the following markers: CEA. Results of the tumor marker test revealed 883.   01/24/2021 Procedure   Successful right-sided port placement, with the tip of the catheter in the proximal right atrium.   Plan: Catheter ready for use.   See detailed procedure note with images in PACS. The patient  tolerated the procedure well without incident or complication and was returned to Recovery in stable conditi   01/25/2021 -  Chemotherapy    Patient is on Treatment Plan: COLORECTAL FOLFOX Q14D       01/25/2021 Tumor Marker   Patient's tumor was tested for the following markers: CEA. Results of the tumor marker test revealed 1378.     CANCER STAGING: Cancer Staging Colon cancer  metastasized to liver [facility]) Staging form: Colon and Rectum, AJCC 8th Edition - Clinical stage from 01/13/2021: Stage IVB (cTX, cN0, pM1b) - Signed by Heath Lark, MD on 01/13/2021   INTERVAL HISTORY:  Mr. DELAN KSIAZEK, a 60 y.o. male, returns for routine follow-up and consideration for next cycle of chemotherapy. Aydden was last seen on 02/01/21 by Dr. Heath Lark.  Due for cycle #2 of FOLFOX today.   Overall, he tells me he has been feeling pretty well, and he is accompanied today by his brother. He reports slight dizziness following treatment. He denies n/v/d and abdominal pain, and he reports constipation that was resolved with miralax. He reports normal BM and denies bloody stool. He reports sleep disturbance due to rectal pain that began in June which he treats with half a 15 mg morphine tablet. He has lost 10 lbs since his diagnosis, although his appetite has improved. He reports a cataract in his left eyes along with a history of high cholesterol and high blood sugar for which he is taking 5 units of insulin when his sugar reaches 150. He check his blood sugar levels in the morning and evenings.   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 He denies history of smoking, and reports his father had prostate cancer.   Overall, he feels ready for next cycle of chemo today.   REVIEW OF SYSTEMS:  Review of Systems  Constitutional:  Negative for appetite change (imrpoved).  Gastrointestinal:  Positive for constipation and rectal pain. Negative for abdominal pain, blood in stool, diarrhea, nausea and vomiting.  Neurological:  Positive for dizziness.  All other systems reviewed and are negative.  PAST MEDICAL/SURGICAL HISTORY:  Past Medical History:  Diagnosis Date   Diabetes mellitus without complication ([place])    Hypertension    Past Surgical History:  Procedure Laterality Date   BIOPSY  01/13/2021   Procedure: BIOPSY;  Surgeon:  Harvel Quale, MD;  [facility];  Service: Gastroenterology;;   CATARACT EXTRACTION Right    COLONOSCOPY WITH PROPOFOL N/A 01/13/2021   Procedure: COLONOSCOPY WITH PROPOFOL;  Surgeon: Harvel Quale, MD;  Location: [facility];  Service: Gastroenterology;  Laterality: N/A;  11:05   ESOPHAGOGASTRODUODENOSCOPY (EGD) WITH PROPOFOL N/A 01/13/2021   Procedure: ESOPHAGOGASTRODUODENOSCOPY (EGD) WITH PROPOFOL;  Surgeon: Harvel Quale, MD;  Location: [facility];  Service: Gastroenterology;  Laterality: N/A;   IR IMAGING GUIDED PORT INSERTION  01/24/2021   LIVER BIOPSY  01/10/2021   U/S guided   POLYPECTOMY  01/13/2021   Procedure: POLYPECTOMY;  Surgeon: Harvel Quale, MD;  Location[facility];  Service: Gastroenterology;;    SOCIAL HISTORY:  Social History   Socioeconomic History   Marital status: Married    Spouse name: Not on file   Number of children: Not on file   Years of education: Not on file   Highest education level: Not on file  Occupational History   Not on file  Tobacco Use   Smoking status: Never   Smokeless tobacco:  Never  Vaping Use   Vaping Use: Never used  Substance and Sexual Activity   Alcohol use: Not Currently   Drug use: Not Currently   Sexual activity: Not Currently  Other Topics Concern   Not on file  Social History Narrative   Married, no childer   Social Determinants of Health   Financial Resource Strain: Not on file  Food Insecurity: Not on file  Transportation Needs: No Transportation Needs   Lack of Transportation (Medical): No   Lack of Transportation (Non-Medical): No  Physical Activity: Inactive   Days of Exercise per Week: 0 days   Minutes of Exercise per Session: 0 min  Stress: Not on file  Social Connections: Not on file  Intimate Partner Violence: Not At Risk   Fear of Current or Ex-Partner: No   Emotionally Abused: No   Physically Abused: No   Sexually Abused: No    FAMILY  HISTORY:  Family History  Problem Relation Age of Onset   Cancer Father 36       prostate ca    CURRENT MEDICATIONS:  Current Outpatient Medications  Medication Sig Dispense Refill   acetaminophen (TYLENOL) 500 MG tablet Take 1,000 mg by mouth every 6 (six) hours as needed for moderate pain.     atenolol (TENORMIN) 50 MG tablet Take 50 mg by mouth daily.     fluorouracil CALGB 27517 2,400 mg/m2 in sodium chloride 0.9 % 150 mL Inject 2,400 mg/m2 into the vein over 96 hr.     insulin NPH-regular Human (70-30) 100 UNIT/ML injection Inject 10-12 Units into the skin 2 (two) times daily.     LEUCOVORIN CALCIUM IV Inject 400 mg/m2 into the vein every 14 (fourteen) days.     lidocaine-prilocaine (EMLA) cream Apply to affected area once 30 g 3   morphine (MSIR) 15 MG tablet Take 1 tablet (15 mg total) by mouth every 6 (six) hours as needed for severe pain. 60 tablet 0   Multiple Vitamin (MULTIVITAMIN WITH MINERALS) TABS tablet Take 1 tablet by mouth daily.     omeprazole (PRILOSEC) 40 MG capsule Take 1 capsule (40 mg total) by mouth daily. 90 capsule 3   ondansetron (ZOFRAN) 8 MG tablet Take 1 tablet (8 mg total) by mouth every 8 (eight) hours as needed for nausea. 30 tablet 3   OXALIPLATIN IV Inject 85 mg/m2 into the vein every 14 (fourteen) days.     polyethylene glycol (MIRALAX) 17 g packet Take 17 g by mouth daily. 30 each 3   prochlorperazine (COMPAZINE) 5 MG tablet Take 1 tablet (5 mg total) by mouth every 6 (six) hours as needed for nausea or vomiting. 30 tablet 3   No current facility-administered medications for this visit.    ALLERGIES:  No Known Allergies  PHYSICAL EXAM:  Performance status (ECOG): 1 - Symptomatic but completely ambulatory  There were no vitals filed for this visit. Wt Readings from Last 3 Encounters:  02/01/21 172 lb 6.4 oz (78.2 kg)  01/24/21 180 lb (81.6 kg)  01/18/21 176 lb 14.4 oz (80.2 kg)   Physical Exam Vitals reviewed.  Constitutional:       Appearance: Normal appearance.  Cardiovascular:     Rate and Rhythm: Normal rate and regular rhythm.     Pulses: Normal pulses.     Heart sounds: Normal heart sounds.  Pulmonary:     Effort: Pulmonary effort is normal.     Breath sounds: Normal breath sounds.  Neurological:  General: No focal deficit present.     Mental Status: He is alert and oriented to person, place, and time.  Psychiatric:        Mood and Affect: Mood normal.        Behavior: Behavior normal.    LABORATORY DATA:  I have reviewed the labs as listed.  CBC Latest Ref Rng & Units 02/01/2021 01/25/2021 01/10/2021  WBC 4.0 - 10.5 K/uL 8.7 12.1(H) 15.3(H)  Hemoglobin 13.0 - 17.0 g/dL 10.7(L) 11.1(L) 12.2(L)  Hematocrit 39.0 - 52.0 % 31.5(L) 32.6(L) 36.5(L)  Platelets 150 - 400 K/uL 257 265 383   CMP Latest Ref Rng & Units 02/01/2021 01/25/2021 12/26/2020  Glucose 70 - 99 mg/dL 191(H) 161(H) 153(H)  BUN 6 - 20 mg/dL 12 13 11   Creatinine 0.61 - 1.24 mg/dL 0.58(L) 0.72 0.75  Sodium 135 - 145 mmol/L 132(L) 132(L) 134(L)  Potassium 3.5 - 5.1 mmol/L 4.3 4.2 4.4  Chloride 98 - 111 mmol/L 98 97(L) 94(L)  CO2 22 - 32 mmol/L 28 27 28   Calcium 8.9 - 10.3 mg/dL 8.7(L) 8.6(L) 9.4  Total Protein 6.5 - 8.1 g/dL 6.7 6.9 6.9  Total Bilirubin 0.3 - 1.2 mg/dL 1.9(H) 1.7(H) 2.5(H)  Alkaline Phos 38 - 126 U/L 895(H) 788(H) 543(H)  AST 15 - 41 U/L 70(H) 67(H) 87(H)  ALT 0 - 44 U/L 41 29 47(H)    DIAGNOSTIC IMAGING:  I have independently reviewed the scans and discussed with the patient. US BIOPSY (LIVER)  Result Date: 01/10/2021 INDICATION: 60 year old male referred for biopsy of liver lesion EXAM: ULTRASOUND-GUIDED LIVER LESION BIOPSY MEDICATIONS: None. ANESTHESIA/SEDATION: Moderate (conscious) sedation was employed during this procedure. A total of Versed 1.0 mg and Fentanyl 50 mcg was administered intravenously. Moderate Sedation Time: 11 minutes. The patient's level of consciousness and vital signs were monitored continuously by  radiology nursing throughout the procedure under my direct supervision. FLUOROSCOPY TIME:  Ultrasound COMPLICATIONS: None PROCEDURE: Informed written consent was obtained from the patient after a thorough discussion of the procedural risks, benefits and alternatives. All questions were addressed. Maximal Sterile Barrier Technique was utilized including caps, mask, sterile gowns, sterile gloves, sterile drape, hand hygiene and skin antiseptic. A timeout was performed prior to the initiation of the procedure. Ultrasound survey of the bilateral liver lobe performed with images stored and sent to PACs. The sub xiphoid region was prepped with chlorhexidine in a sterile fashion, and a sterile drape was applied covering the operative field. A sterile gown and sterile gloves were used for the procedure. Local anesthesia was provided with 1% Lidocaine. The patient was prepped and draped sterilely and the skin and subcutaneous tissues were generously infiltrated with 1% lidocaine. A 17 gauge introducer needle was then advanced under ultrasound guidance in the subxiphoid location into the left liver lobe, targeting heterogeneously hypoechoic lesion. The stylet was removed, and multiple separate 18 gauge core biopsy were retrieved. Samples were placed into formalin for transportation to the lab. Gel-Foam pledgets were then infused with a small amount of saline for assistance with hemostasis. The needle was removed, and a final ultrasound image was performed. The patient tolerated the procedure well and remained hemodynamically stable throughout. No complications were encountered and no significant blood loss was encounter. IMPRESSION: Status post ultrasound-guided biopsy of left liver mass. Signed, Dulcy Fanny. Dellia Nims, RPVI [facility] Radiology Electronically Signed   By: Corrie Mckusick D.O.   On: 01/10/2021 17:01   IR IMAGING GUIDED PORT INSERTION  Result Date:  01/24/2021 INDICATION: 60 year old male with colon cancer, requiring chemotherapy EXAM: IMPLANTED PORT A CATH PLACEMENT WITH ULTRASOUND AND FLUOROSCOPIC GUIDANCE MEDICATIONS: None.; ANESTHESIA/SEDATION: Moderate (conscious) sedation was employed during this procedure. A total of Versed 3 mg and Fentanyl 100 mcg was administered intravenously. Moderate Sedation Time: 30 minutes. The patient's level of consciousness and vital signs were monitored continuously by radiology nursing throughout the procedure under my direct supervision. FLUOROSCOPY TIME:  0 minutes, 30 seconds (2 mGy) COMPLICATIONS: None immediate. PROCEDURE: The procedure, risks, benefits, and alternatives were explained to the patient. Questions regarding the procedure were encouraged and answered. The patient understands and consents to the procedure. The right neck and chest were prepped with chlorhexidine in a sterile fashion, and a sterile drape was applied covering the operative field. Maximum barrier sterile technique with sterile gowns and gloves were used for the procedure. A timeout was performed prior to the initiation of the procedure. Local anesthesia was provided with 1% lidocaine with epinephrine. After creating a small venotomy incision, a micropuncture kit was utilized to access the internal jugular vein under direct, real-time ultrasound guidance. Ultrasound image documentation was performed. The microwire was kinked to measure appropriate catheter length. A subcutaneous port pocket was then created along the upper chest wall utilizing a combination of sharp and blunt dissection. The pocket was irrigated with sterile saline. A single lumen ISP power injectable port was chosen for placement. The 8 Fr catheter was tunneled from the port pocket site to the venotomy incision. The port was placed in the pocket. The external catheter was trimmed to appropriate length. At the venotomy, an 8 Fr peel-away sheath was placed over a guidewire under  fluoroscopic guidance. The catheter was then placed through the sheath and the sheath was removed. Final catheter positioning was confirmed and documented with a fluoroscopic spot radiograph. The port was accessed with a Huber needle, aspirated and flushed with heparinized saline. The venotomy site was closed with an interrupted 3-0 Vicryl suture. The port pocket incision was closed with interrupted 3-0 Vicryl suture and Dermabond and was applied to both incisions. Dressings were placed. The patient tolerated the procedure well without immediate post procedural complication. IMPRESSION: Successful placement of a right internal jugular approach power injectable Port-A-Cath. The catheter is ready for immediate use. Electronically Signed   By: Michaelle Birks MD   On: 01/24/2021 16:10     ASSESSMENT:  1.  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 - CT AP with contrast on 12/26/2020 showed extensive hepatic metastatic disease and retroperitoneal adenopathy.  CT chest without contrast on 12/26/2020 showed several small subcentimeter lung nodules consistent with metastatic disease.  Liver morphology suggests underlying cirrhosis. - Liver biopsy on 01/10/2021 consistent with metastatic adenocarcinoma, CK20 and CDX2 positive, negative for CK7, TTF-1, PSA and prostein. - Colonoscopy and biopsy of the sigmoid mass on 01/13/2021 consistent with adenocarcinoma. - 10 pound weight loss prior to start of therapy. - FOLFOX started on 01/25/2021. - Foundation 1 testing on 01/10/2021 showed MSI-stable, TMB low, negative for RAS/BRAF/HER2 mutation.  2.  Social/family history: - Lives at home with his wife.  He drove truck and operated equipment.  No smoking history. - Father had prostate cancer.   PLAN:  1.  Metastatic sigmoid colon cancer to liver and lungs: - He has tolerated first cycle reasonably well.  Denied any nausea or vomiting. -  Reviewed his labs today which showed elevated alkaline phosphatase of 867 and bilirubin  of 1.8.  CBC shows normal white count and platelet count.  CEA was 1378 on 01/25/2021. - We have discussed adding anti-EGFR antibody panitumumab to the chemo regimen to achieve optimal response.  We have discussed side effects including but not limited to diarrhea and skin rash. - We will start him on doxycycline 100 mg twice daily at the time of panitumumab first dose.  He was also sent a prescription for hydrocortisone cream to be applied twice daily should he develop a rash. - We will likely obtain free medication from the drug company if he qualifies. - Proceed with chemotherapy today and RTC in 2 weeks.  2.  Coccygeal pain: - He reports pain at the coccygeal region since June of this year. - Continue half tablet of MS IR 15 mg at nighttime.  Pain is well controlled.  3.  Diabetes: - He is on NPH 70/30.  He takes 5 units if blood sugar is more than 150. - I have recommended checking his fasting sugar and at nighttime after meal. - We will increase his insulin dose to 8 units if it is more than 200. - He was told to bring a log of sugars at next visit.  4.  Difficulty falling/staying asleep: - We will start him on Restoril 15 mg at bedtime.    Orders placed this encounter:  No orders of the defined types were placed in this encounter.  Total time spent is 40 minutes with more than 50% of the time spent face-to-face discussing new treatment plan, counseling and coordination of care.  Derek Jack, MD [place] [phone]   I, Thana Ates, am acting as a scribe for Dr. Derek Jack.  I, Derek Jack MD, have reviewed the above documentation for accuracy and completeness, and I agree with the above.

## 2021-02-08 ENCOUNTER — Other Ambulatory Visit: Payer: Self-pay

## 2021-02-08 ENCOUNTER — Inpatient Hospital Stay (HOSPITAL_COMMUNITY): Payer: Medicaid Other

## 2021-02-08 ENCOUNTER — Inpatient Hospital Stay (HOSPITAL_BASED_OUTPATIENT_CLINIC_OR_DEPARTMENT_OTHER): Payer: Medicaid Other | Admitting: Hematology

## 2021-02-08 VITALS — BP 136/70 | HR 79 | Temp 96.9°F | Resp 18

## 2021-02-08 DIAGNOSIS — C787 Secondary malignant neoplasm of liver and intrahepatic bile duct: Secondary | ICD-10-CM

## 2021-02-08 DIAGNOSIS — C7801 Secondary malignant neoplasm of right lung: Secondary | ICD-10-CM | POA: Diagnosis not present

## 2021-02-08 DIAGNOSIS — Z5112 Encounter for antineoplastic immunotherapy: Secondary | ICD-10-CM | POA: Diagnosis not present

## 2021-02-08 DIAGNOSIS — E119 Type 2 diabetes mellitus without complications: Secondary | ICD-10-CM | POA: Diagnosis not present

## 2021-02-08 DIAGNOSIS — C189 Malignant neoplasm of colon, unspecified: Secondary | ICD-10-CM

## 2021-02-08 DIAGNOSIS — Z79899 Other long term (current) drug therapy: Secondary | ICD-10-CM | POA: Diagnosis not present

## 2021-02-08 DIAGNOSIS — C187 Malignant neoplasm of sigmoid colon: Secondary | ICD-10-CM | POA: Diagnosis not present

## 2021-02-08 DIAGNOSIS — C7802 Secondary malignant neoplasm of left lung: Secondary | ICD-10-CM | POA: Diagnosis not present

## 2021-02-08 DIAGNOSIS — Z5111 Encounter for antineoplastic chemotherapy: Secondary | ICD-10-CM | POA: Diagnosis present

## 2021-02-08 LAB — COMPREHENSIVE METABOLIC PANEL WITH GFR
ALT: 35 U/L (ref 0–44)
AST: 63 U/L — ABNORMAL HIGH (ref 15–41)
Albumin: 3 g/dL — ABNORMAL LOW (ref 3.5–5.0)
Alkaline Phosphatase: 867 U/L — ABNORMAL HIGH (ref 38–126)
Anion gap: 9 (ref 5–15)
BUN: 11 mg/dL (ref 6–20)
CO2: 27 mmol/L (ref 22–32)
Calcium: 8.8 mg/dL — ABNORMAL LOW (ref 8.9–10.3)
Chloride: 99 mmol/L (ref 98–111)
Creatinine, Ser: 0.73 mg/dL (ref 0.61–1.24)
GFR, Estimated: >60 mL/min
Glucose, Bld: 199 mg/dL — ABNORMAL HIGH (ref 70–99)
Potassium: 4.1 mmol/L (ref 3.5–5.1)
Sodium: 135 mmol/L (ref 135–145)
Total Bilirubin: 1.8 mg/dL — ABNORMAL HIGH (ref 0.3–1.2)
Total Protein: 6.9 g/dL (ref 6.5–8.1)

## 2021-02-08 LAB — CBC WITH DIFFERENTIAL/PLATELET
Abs Immature Granulocytes: 0.04 10*3/uL (ref 0.00–0.07)
Basophils Absolute: 0.1 10*3/uL (ref 0.0–0.1)
Basophils Relative: 1 %
Eosinophils Absolute: 0.6 10*3/uL — ABNORMAL HIGH (ref 0.0–0.5)
Eosinophils Relative: 8 %
HCT: 32.9 % — ABNORMAL LOW (ref 39.0–52.0)
Hemoglobin: 10.7 g/dL — ABNORMAL LOW (ref 13.0–17.0)
Immature Granulocytes: 1 %
Lymphocytes Relative: 16 %
Lymphs Abs: 1.4 10*3/uL (ref 0.7–4.0)
MCH: 29.8 pg (ref 26.0–34.0)
MCHC: 32.5 g/dL (ref 30.0–36.0)
MCV: 91.6 fL (ref 80.0–100.0)
Monocytes Absolute: 0.8 10*3/uL (ref 0.1–1.0)
Monocytes Relative: 9 %
Neutro Abs: 5.5 10*3/uL (ref 1.7–7.7)
Neutrophils Relative %: 65 %
Platelets: 238 10*3/uL (ref 150–400)
RBC: 3.59 MIL/uL — ABNORMAL LOW (ref 4.22–5.81)
RDW: 15.3 % (ref 11.5–15.5)
WBC: 8.4 10*3/uL (ref 4.0–10.5)
nRBC: 0 % (ref 0.0–0.2)

## 2021-02-08 LAB — MAGNESIUM: Magnesium: 1.9 mg/dL (ref 1.7–2.4)

## 2021-02-08 MED ORDER — DOXYCYCLINE HYCLATE 100 MG PO TABS
100.0000 mg | ORAL_TABLET | Freq: Two times a day (BID) | ORAL | 6 refills | Status: DC
Start: 2021-02-08 — End: 2021-06-05

## 2021-02-08 MED ORDER — DEXAMETHASONE SODIUM PHOSPHATE 100 MG/10ML IJ SOLN
10.0000 mg | Freq: Once | INTRAMUSCULAR | Status: AC
  Administered 2021-02-08: 10 mg via INTRAVENOUS
  Filled 2021-02-08: qty 10

## 2021-02-08 MED ORDER — SODIUM CHLORIDE 0.9 % IV SOLN
5000.0000 mg | INTRAVENOUS | Status: DC
  Administered 2021-02-08: 5000 mg via INTRAVENOUS
  Filled 2021-02-08: qty 100

## 2021-02-08 MED ORDER — HYDROCORTISONE 1 % EX OINT: 1.0000 "application " | TOPICAL_OINTMENT | Freq: Two times a day (BID) | CUTANEOUS | 6 refills | Status: DC

## 2021-02-08 MED ORDER — OXALIPLATIN CHEMO INJECTION 100 MG/20ML
85.0000 mg/m2 | Freq: Once | INTRAVENOUS | Status: AC
  Administered 2021-02-08: 165 mg via INTRAVENOUS
  Filled 2021-02-08: qty 33

## 2021-02-08 MED ORDER — PALONOSETRON HCL INJECTION 0.25 MG/5ML
0.2500 mg | Freq: Once | INTRAVENOUS | Status: AC
  Administered 2021-02-08: 0.25 mg via INTRAVENOUS

## 2021-02-08 MED ORDER — INSULIN ASPART 100 UNIT/ML IJ SOLN
10.0000 [IU] | Freq: Once | INTRAMUSCULAR | Status: AC
  Administered 2021-02-08: 10 [IU] via INTRAVENOUS
  Filled 2021-02-08: qty 0.1

## 2021-02-08 MED ORDER — TEMAZEPAM 15 MG PO CAPS: 15.0000 mg | ORAL_CAPSULE | Freq: Every evening | ORAL | 0 refills | Status: DC | PRN

## 2021-02-08 MED ORDER — DEXTROSE 5 % IV SOLN
400.0000 mg/m2 | Freq: Once | INTRAVENOUS | Status: AC
  Administered 2021-02-08: 784 mg via INTRAVENOUS
  Filled 2021-02-08: qty 39.2

## 2021-02-08 MED ORDER — HEPARIN SOD (PORK) LOCK FLUSH 100 UNIT/ML IV SOLN: 500.0000 [IU] | Freq: Once | INTRAVENOUS | Status: DC | PRN

## 2021-02-08 MED ORDER — DEXTROSE 5 % IV SOLN: Freq: Once | INTRAVENOUS | Status: AC

## 2021-02-08 MED ORDER — INSULIN ASPART 100 UNIT/ML IJ SOLN
10.0000 [IU] | Freq: Once | INTRAMUSCULAR | Status: DC
Start: 2021-02-08 — End: 2021-02-08
  Filled 2021-02-08: qty 0.1

## 2021-02-08 MED ORDER — SODIUM CHLORIDE 0.9% FLUSH: 10.0000 mL | INTRAVENOUS | Status: DC | PRN

## 2021-02-08 MED ORDER — PALONOSETRON HCL INJECTION 0.25 MG/5ML
INTRAVENOUS | Status: AC
  Filled 2021-02-08: qty 5

## 2021-02-08 NOTE — Progress Notes (Signed)
Patient is uninsured and team is enrolling in patient assistance program for Vectibix.  Dr Delton Coombes approved delaying until cycle 3 to ensure drug replacement approval from manufacturer.  T.O. Dr Rhys Martini, PharmD

## 2021-02-08 NOTE — Progress Notes (Signed)
Patient has been assessed, vital signs and labs have been reviewed by Dr. Delton Coombes. ANC, Creatinine, LFTs, and Platelets are within treatment parameters per Dr. Delton Coombes. The patient is good to proceed with treatment at this time.  Insulin 10 units x 1 toady IV. Primary RN and pharmacy aware.

## 2021-02-08 NOTE — Progress Notes (Signed)
Patient presents today for FOLFOX with 5FU pump start per providers order.  Vital signs within parameters for treatment.  Labs reviewed and total bili noted to be 1.8, MD notified.  Vetibix will be added to treatment plan per provider for next cycle. Patient has no new complaints at this time.  FOLFOX given today per MD orders.  Stable during infusion without adverse affects.  5FU pump connected and verified RUN on the screen with the patient.  Vital signs stable.  No complaints at this time.  Discharge from clinic ambulatory in stable condition.  Alert and oriented X 3.  Follow up with [facility] as scheduled.

## 2021-02-08 NOTE — Patient Instructions (Signed)
[facility]  Discharge Instructions: Thank you for choosing [facility] to provide your oncology and hematology care.  If you have a lab appointment with the [facility], please come in thru the Main Entrance and check in at the main information desk.  Wear comfortable clothing and clothing appropriate for easy access to any Portacath or PICC line.   We strive to give you quality time with your provider. You may need to reschedule your appointment if you arrive late (15 or more minutes).  Arriving late affects you and other patients whose appointments are after yours.  Also, if you miss three or more appointments without notifying the office, you may be dismissed from the clinic at the provider's discretion.      For prescription refill requests, have your pharmacy contact our office and allow 72 hours for refills to be completed.    Today you received the following chemotherapy and/or immunotherapy agents FOLFOX with 5FU      To help prevent nausea and vomiting after your treatment, we encourage you to take your nausea medication as directed.  BELOW ARE SYMPTOMS THAT SHOULD BE REPORTED IMMEDIATELY: *FEVER GREATER THAN 100.4 F (38 C) OR HIGHER *CHILLS OR SWEATING *NAUSEA AND VOMITING THAT IS NOT CONTROLLED WITH YOUR NAUSEA MEDICATION *UNUSUAL SHORTNESS OF BREATH *UNUSUAL BRUISING OR BLEEDING *URINARY PROBLEMS (pain or burning when urinating, or frequent urination) *BOWEL PROBLEMS (unusual diarrhea, constipation, pain near the anus) TENDERNESS IN MOUTH AND THROAT WITH OR WITHOUT PRESENCE OF ULCERS (sore throat, sores in mouth, or a toothache) UNUSUAL RASH, SWELLING OR PAIN  UNUSUAL VAGINAL DISCHARGE OR ITCHING   Items with * indicate a potential emergency and should be followed up as soon as possible or go to the Emergency Department if any problems should occur.  Please show the CHEMOTHERAPY ALERT CARD or IMMUNOTHERAPY ALERT CARD at check-in to the Emergency  Department and triage nurse.  Should you have questions after your visit or need to cancel or reschedule your appointment, please contact [facility] [phone]  and follow the prompts.  Office hours are 8:00 a.m. to 4:30 p.m. Monday - Friday. Please note that voicemails left after 4:00 p.m. may not be returned until the following business day.  We are closed weekends and major holidays. You have access to a nurse at all times for urgent questions. Please call the main number to the clinic [phone] and follow the prompts.  For any non-urgent questions, you may also contact your provider using MyChart. We now offer e-Visits for anyone 74 and older to request care online for non-urgent symptoms. For details visit [email or web address].   Also download the MyChart app! Go to the app store, search "MyChart", open the app, select [facility], and log in with your MyChart username and password.  Due to Covid, a mask is required upon entering the hospital/clinic. If you do not have a mask, one will be given to you upon arrival. For doctor visits, patients may have 1 support person aged 54 or older with them. For treatment visits, patients cannot have anyone with them due to current Covid guidelines and our immunocompromised population.

## 2021-02-08 NOTE — Patient Instructions (Addendum)
Henry Johns at [facility] Discharge Instructions  You were seen today by Dr. Delton Coombes. He went over your recent results and scans, and you received your treatment. The treatment you are receiving can cause sensitivity to sunlight and skin rash. Please avoid direct sun exposure and take precautions while outdoors such as wearing a hat, long sleeves, and sunscreen. You will be prescribed Doxycycline to be taken 2 times a day to help prevent the rash. If you develop the rash, you have been prescribed Hydrocortisone cream to be applied 2 times daily. This treatment can also cause diarrhea. Keep Imodium on hand to be taken as needed for watery diarrhea. Dr. Delton Coombes will see you back in 2 weeks for labs and follow up.   Thank you for choosing [place] at [facility] to provide your oncology and hematology care.  To afford each patient quality time with our provider, please arrive at least 15 minutes before your scheduled appointment time.   If you have a lab appointment with the [facility] please come in thru the Main Entrance and check in at the main information desk  You need to re-schedule your appointment should you arrive 10 or more minutes late.  We strive to give you quality time with our providers, and arriving late affects you and other patients whose appointments are after yours.  Also, if you no show three or more times for appointments you may be dismissed from the clinic at the providers discretion.     Again, thank you for choosing [facility].  Our hope is that these requests will decrease the amount of time that you wait before being seen by our physicians.       _____________________________________________________________  Should you have questions after your visit to [facility], please contact our office at [phone] between the hours of 8:00 a.m. and 4:30 p.m.  Voicemails left after 4:00 p.m. will not  be returned until the following business day.  For prescription refill requests, have your pharmacy contact our office and allow 72 hours.    Cancer Center Support Programs:   > Cancer Support Group  2nd Tuesday of the month 1pm-2pm, Journey Room

## 2021-02-10 ENCOUNTER — Other Ambulatory Visit: Payer: Self-pay

## 2021-02-10 ENCOUNTER — Encounter (HOSPITAL_COMMUNITY): Payer: Self-pay | Admitting: Hematology and Oncology

## 2021-02-10 ENCOUNTER — Inpatient Hospital Stay (HOSPITAL_COMMUNITY): Payer: Medicaid Other

## 2021-02-10 ENCOUNTER — Encounter (HOSPITAL_COMMUNITY): Payer: Self-pay

## 2021-02-10 VITALS — BP 103/63 | HR 102 | Temp 98.0°F | Resp 19

## 2021-02-10 DIAGNOSIS — C787 Secondary malignant neoplasm of liver and intrahepatic bile duct: Secondary | ICD-10-CM

## 2021-02-10 DIAGNOSIS — C189 Malignant neoplasm of colon, unspecified: Secondary | ICD-10-CM

## 2021-02-10 DIAGNOSIS — Z5112 Encounter for antineoplastic immunotherapy: Secondary | ICD-10-CM | POA: Diagnosis not present

## 2021-02-10 MED ORDER — HEPARIN SOD (PORK) LOCK FLUSH 100 UNIT/ML IV SOLN
500.0000 [IU] | Freq: Once | INTRAVENOUS | Status: AC | PRN
  Administered 2021-02-10: 500 [IU]

## 2021-02-10 MED ORDER — SODIUM CHLORIDE 0.9% FLUSH
10.0000 mL | INTRAVENOUS | Status: DC | PRN
  Administered 2021-02-10: 10 mL

## 2021-02-10 NOTE — Progress Notes (Signed)
Chemotherapy pump disconnected with no complaints voiced.  Patients port flushed without difficulty.  Good blood return noted with no bruising or swelling noted at site.  Band aid applied.  VSS with discharge and left in satisfactory condition with no s/s of distress noted.    

## 2021-02-10 NOTE — Patient Instructions (Signed)
[facility]  Discharge Instructions: Thank you for choosing [facility] to provide your oncology and hematology care.  If you have a lab appointment with the [facility], please come in thru the Main Entrance and check in at the main information desk.  Wear comfortable clothing and clothing appropriate for easy access to any Portacath or PICC line.   We strive to give you quality time with your provider. You may need to reschedule your appointment if you arrive late (15 or more minutes).  Arriving late affects you and other patients whose appointments are after yours.  Also, if you miss three or more appointments without notifying the office, you may be dismissed from the clinic at the provider's discretion.      For prescription refill requests, have your pharmacy contact our office and allow 72 hours for refills to be completed.    Today you received the following chemotherapy and/or immunotherapy agents chemotherapy pump disconnect.       To help prevent nausea and vomiting after your treatment, we encourage you to take your nausea medication as directed.  BELOW ARE SYMPTOMS THAT SHOULD BE REPORTED IMMEDIATELY: *FEVER GREATER THAN 100.4 F (38 C) OR HIGHER *CHILLS OR SWEATING *NAUSEA AND VOMITING THAT IS NOT CONTROLLED WITH YOUR NAUSEA MEDICATION *UNUSUAL SHORTNESS OF BREATH *UNUSUAL BRUISING OR BLEEDING *URINARY PROBLEMS (pain or burning when urinating, or frequent urination) *BOWEL PROBLEMS (unusual diarrhea, constipation, pain near the anus) TENDERNESS IN MOUTH AND THROAT WITH OR WITHOUT PRESENCE OF ULCERS (sore throat, sores in mouth, or a toothache) UNUSUAL RASH, SWELLING OR PAIN  UNUSUAL VAGINAL DISCHARGE OR ITCHING   Items with * indicate a potential emergency and should be followed up as soon as possible or go to the Emergency Department if any problems should occur.  Please show the CHEMOTHERAPY ALERT CARD or IMMUNOTHERAPY ALERT CARD at check-in to  the Emergency Department and triage nurse.  Should you have questions after your visit or need to cancel or reschedule your appointment, please contact [facility] [phone]  and follow the prompts.  Office hours are 8:00 a.m. to 4:30 p.m. Monday - Friday. Please note that voicemails left after 4:00 p.m. may not be returned until the following business day.  We are closed weekends and major holidays. You have access to a nurse at all times for urgent questions. Please call the main number to the clinic [phone] and follow the prompts.  For any non-urgent questions, you may also contact your provider using MyChart. We now offer e-Visits for anyone 94 and older to request care online for non-urgent symptoms. For details visit [email or web address].   Also download the MyChart app! Go to the app store, search "MyChart", open the app, select [facility], and log in with your MyChart username and password.  Due to Covid, a mask is required upon entering the hospital/clinic. If you do not have a mask, one will be given to you upon arrival. For doctor visits, patients may have 1 support person aged 57 or older with them. For treatment visits, patients cannot have anyone with them due to current Covid guidelines and our immunocompromised population.

## 2021-02-14 ENCOUNTER — Other Ambulatory Visit (HOSPITAL_COMMUNITY): Payer: Self-pay | Admitting: Hematology

## 2021-02-15 ENCOUNTER — Other Ambulatory Visit (HOSPITAL_COMMUNITY): Payer: Self-pay | Admitting: *Deleted

## 2021-02-15 ENCOUNTER — Other Ambulatory Visit: Payer: Self-pay | Admitting: Hematology and Oncology

## 2021-02-15 MED ORDER — MORPHINE SULFATE 15 MG PO TABS: 15.0000 mg | ORAL_TABLET | Freq: Four times a day (QID) | ORAL | 0 refills | Status: DC | PRN

## 2021-02-17 ENCOUNTER — Encounter: Payer: Self-pay | Admitting: General Practice

## 2021-02-17 NOTE — Progress Notes (Signed)
[facility] provided proof of filing for Social Security disability on patient behalf.  Edwyna Shell, LCSW Clinical Social Worker Phone:  [phone]

## 2021-02-20 ENCOUNTER — Encounter (HOSPITAL_COMMUNITY): Payer: Self-pay | Admitting: Hematology

## 2021-02-21 NOTE — Progress Notes (Signed)
[facility] [address], [place] [address]   CLINIC:  Medical Oncology/Hematology  PCP:  Lucia Gaskins, MD [place] /  [place] [phone]   REASON FOR VISIT:  Follow-up for colon caner metastasized to liver  PRIOR THERAPY: none  NGS Results: not done  CURRENT THERAPY: FOLFOX every 2 weeks  BRIEF ONCOLOGIC HISTORY:  Oncology History Overview Note  MMR normal Foundation One: low mutation burden   Metastasis to liver ([place])  01/04/2021 Initial Diagnosis   Metastasis to liver ([place])   01/25/2021 -  Chemotherapy    Patient is on Treatment Plan: COLORECTAL FOLFOX Q14D       Colon cancer metastasized to liver ([place])  12/26/2020 Imaging   Ct abdomen and pelvis 1. Extensive hepatic metastatic disease and retroperitoneal adenopathy. 2. A 7 mm right lung base nodule, most consistent with metastatic disease. 3. No bowel obstruction. Normal appendix. 4. Aortic Atherosclerosis (ICD10-I70.0).   12/26/2020 Imaging   CT chest 1. Several small lung nodules consistent with metastatic disease. The primary malignancy remains unclear. The liver morphology suggests underlying cirrhosis, and multifocal hepatocellular carcinoma should be included in the differential diagnosis. 2. No acute abnormality in the chest.     01/04/2021 Initial Diagnosis   Carcinoma metastatic to lung [facility])   01/10/2021 Pathology Results   A. LIVER, NEEDLE CORE BIOPSY:  -  Metastatic adenocarcinoma  -  See comment   COMMENT:   The neoplastic cells are positive for cytokeratin 20 and CDX2 but negative for cytokeratin 7, TTF-1, PSA and prostein.  The combined morphology and immunophenotype are consistent with metastasis from a  gastrointestinal primary.  Dr. Saralyn Pilar reviewed the case and agrees with the above diagnosis.     01/13/2021 Procedure   Colonoscopy - Two 3 to 5 mm polyps in the transverse colon, removed with a cold snare. Resected and retrieved. -  Malignant partially obstructing tumor at 17 cm proximal to the anus. Biopsied. Tattooed. - The distal rectum and anal verge are normal on retroflexion view.   01/13/2021 Cancer Staging   Staging form: Colon and Rectum, AJCC 8th Edition - Clinical stage from 01/13/2021: Stage IVB (cTX, cN0, pM1b) - Signed by Heath Lark, MD on 01/13/2021 Stage prefix: Initial diagnosis Total positive nodes: 0   01/13/2021 Procedure   Colonoscopy - Two 3 to 5 mm polyps in the transverse colon, removed with a cold snare. Resected and retrieved. - Malignant partially obstructing tumor at 17 cm proximal to the anus. Biopsied. Tattooed. - The distal rectum and anal verge are normal on retroflexion view.   01/13/2021 Procedure   EGD - Normal esophagus. - Z-line regular, 40 cm from the incisors. - Normal stomach. - Nodular mucosa in   01/13/2021 Pathology Results   A. DUODENUM, NODULE, BIOPSY:  - Peptic duodenitis.  - No dysplasia or malignancy.   B. COLON, SIGMOID, MASS, BIOPSY:  - Invasive adenocarcinoma.   C. COLON, TRANSVERSE, POLYPECTOMY:  - Fragment of ulcerated tissue with adenocarcinoma, see comment.  - Tubular adenoma (X2 fragments).   COMMENT:   B. MMR will be ordered   01/13/2021 Tumor Marker   Patient's tumor was tested for the following markers: CEA. Results of the tumor marker test revealed 883.   01/24/2021 Procedure   Successful right-sided port placement, with the tip of the catheter in the proximal right atrium.   Plan: Catheter ready for use.   See detailed procedure note with images in PACS. The patient tolerated the  procedure well without incident or complication and was returned to Recovery in stable conditi   01/25/2021 -  Chemotherapy    Patient is on Treatment Plan: COLORECTAL FOLFOX Q14D       01/25/2021 Tumor Marker   Patient's tumor was tested for the following markers: CEA. Results of the tumor marker test revealed 1378.     CANCER STAGING: Cancer Staging Colon  cancer metastasized to liver [facility]) Staging form: Colon and Rectum, AJCC 8th Edition - Clinical stage from 01/13/2021: Stage IVB (cTX, cN0, pM1b) - Signed by Heath Lark, MD on 01/13/2021   INTERVAL HISTORY:  Henry Johns, a 60 y.o. male, returns for routine follow-up and consideration for next cycle of chemotherapy. Henry Johns was last seen on 02/08/21.  Due for cycle #3 of FOLFOX today.   Overall, he tells me he has been feeling pretty well. He denies numbness or tingling, but he reports mild cold sensitivity for 3-4 days following treatment. He reports occasional pain in his coccyx for which he is taking a half tablet of morphine prn at bedtime. He denies n/v/d and ankle swellings.   Overall, he feels ready for next cycle of chemo today.   REVIEW OF SYSTEMS:  Review of Systems  Constitutional:  Positive for appetite change (65%) and fatigue (65%).  Cardiovascular:  Negative for leg swelling.  Gastrointestinal:  Negative for diarrhea, nausea and vomiting.  Neurological:  Negative for numbness.  All other systems reviewed and are negative.  PAST MEDICAL/SURGICAL HISTORY:  Past Medical History:  Diagnosis Date   Diabetes mellitus without complication ([place])    Hypertension    Past Surgical History:  Procedure Laterality Date   BIOPSY  01/13/2021   Procedure: BIOPSY;  Surgeon: Harvel Quale, MD;  Location: [facility];  Service: Gastroenterology;;   CATARACT EXTRACTION Right    COLONOSCOPY WITH PROPOFOL N/A 01/13/2021   Procedure: COLONOSCOPY WITH PROPOFOL;  Surgeon: Harvel Quale, MD;  Location: [facility];  Service: Gastroenterology;  Laterality: N/A;  11:05   ESOPHAGOGASTRODUODENOSCOPY (EGD) WITH PROPOFOL N/A 01/13/2021   Procedure: ESOPHAGOGASTRODUODENOSCOPY (EGD) WITH PROPOFOL;  Surgeon: Harvel Quale, MD;  Location: [facility];  Service: Gastroenterology;  Laterality: N/A;   IR IMAGING GUIDED PORT INSERTION  01/24/2021   LIVER BIOPSY   01/10/2021   U/S guided   POLYPECTOMY  01/13/2021   Procedure: POLYPECTOMY;  Surgeon: Harvel Quale, MD;  Location: [facility];  Service: Gastroenterology;;    SOCIAL HISTORY:  Social History   Socioeconomic History   Marital status: Married    Spouse name: Not on file   Number of children: Not on file   Years of education: Not on file   Highest education level: Not on file  Occupational History   Not on file  Tobacco Use   Smoking status: Never   Smokeless tobacco: Never  Vaping Use   Vaping Use: Never used  Substance and Sexual Activity   Alcohol use: Not Currently   Drug use: Not Currently   Sexual activity: Not Currently  Other Topics Concern   Not on file  Social History Narrative   Married, no childer   Social Determinants of Health   Financial Resource Strain: Not on file  Food Insecurity: Not on file  Transportation Needs: No Transportation Needs   Lack of Transportation (Medical): No   Lack of Transportation (Non-Medical): No  Physical Activity: Inactive   Days of Exercise per Week: 0 days   Minutes of Exercise  per Session: 0 min  Stress: Not on file  Social Connections: Not on file  Intimate Partner Violence: Not At Risk   Fear of Current or Ex-Partner: No   Emotionally Abused: No   Physically Abused: No   Sexually Abused: No    FAMILY HISTORY:  Family History  Problem Relation Age of Onset   Cancer Father 62       prostate ca    CURRENT MEDICATIONS:  Current Outpatient Medications  Medication Sig Dispense Refill   acetaminophen (TYLENOL) 500 MG tablet Take 1,000 mg by mouth every 6 (six) hours as needed for moderate pain.     atenolol (TENORMIN) 50 MG tablet Take 50 mg by mouth daily.     doxycycline (VIBRA-TABS) 100 MG tablet Take 1 tablet (100 mg total) by mouth 2 (two) times daily. 60 tablet 6   fluorouracil CALGB 77192 2,400 mg/m2 in sodium chloride 0.9 % 150 mL Inject 2,400 mg/m2 into the vein over 96 hr.     hydrocortisone  1 % ointment Apply 1 application topically 2 (two) times daily. 60 g 6   insulin NPH-regular Human (70-30) 100 UNIT/ML injection Inject 10-12 Units into the skin 2 (two) times daily.     LEUCOVORIN CALCIUM IV Inject 400 mg/m2 into the vein every 14 (fourteen) days.     lidocaine-prilocaine (EMLA) cream Apply to affected area once 30 g 3   morphine (MSIR) 15 MG tablet Take 1 tablet (15 mg total) by mouth every 6 (six) hours as needed for severe pain. 60 tablet 0   Multiple Vitamin (MULTIVITAMIN WITH MINERALS) TABS tablet Take 1 tablet by mouth daily.     omeprazole (PRILOSEC) 40 MG capsule Take 1 capsule (40 mg total) by mouth daily. 90 capsule 3   ondansetron (ZOFRAN) 8 MG tablet Take 1 tablet (8 mg total) by mouth every 8 (eight) hours as needed for nausea. 30 tablet 3   OXALIPLATIN IV Inject 85 mg/m2 into the vein every 14 (fourteen) days.     polyethylene glycol (MIRALAX) 17 g packet Take 17 g by mouth daily. 30 each 3   prochlorperazine (COMPAZINE) 5 MG tablet Take 1 tablet (5 mg total) by mouth every 6 (six) hours as needed for nausea or vomiting. 30 tablet 3   simvastatin (ZOCOR) 20 MG tablet SMARTSIG:1 Tablet(s) By Mouth Every Evening     temazepam (RESTORIL) 15 MG capsule TAKE 1 CAPSULE BY MOUTH AT BEDTIME AS NEEDED FOR SLEEP 30 capsule 0   No current facility-administered medications for this visit.    ALLERGIES:  No Known Allergies  PHYSICAL EXAM:  Performance status (ECOG): 1 - Symptomatic but completely ambulatory  There were no vitals filed for this visit. Wt Readings from Last 3 Encounters:  02/08/21 171 lb 6.4 oz (77.7 kg)  02/01/21 172 lb 6.4 oz (78.2 kg)  01/24/21 180 lb (81.6 kg)   Physical Exam Vitals reviewed.  Constitutional:      Appearance: Normal appearance.  Cardiovascular:     Rate and Rhythm: Normal rate and regular rhythm.     Pulses: Normal pulses.     Heart sounds: Normal heart sounds.  Pulmonary:     Effort: Pulmonary effort is normal.     Breath  sounds: Normal breath sounds.  Abdominal:     Palpations: Abdomen is soft. There is no hepatomegaly, splenomegaly or mass.     Tenderness: There is no abdominal tenderness.  Musculoskeletal:     Right lower leg: No edema.  Left lower leg: No edema.  Neurological:     General: No focal deficit present.     Mental Status: He is alert and oriented to person, place, and time.  Psychiatric:        Mood and Affect: Mood normal.        Behavior: Behavior normal.    LABORATORY DATA:  I have reviewed the labs as listed.  CBC Latest Ref Rng & Units 02/08/2021 02/01/2021 01/25/2021  WBC 4.0 - 10.5 K/uL 8.4 8.7 12.1(H)  Hemoglobin 13.0 - 17.0 g/dL 10.7(L) 10.7(L) 11.1(L)  Hematocrit 39.0 - 52.0 % 32.9(L) 31.5(L) 32.6(L)  Platelets 150 - 400 K/uL 238 257 265   CMP Latest Ref Rng & Units 02/08/2021 02/01/2021 01/25/2021  Glucose 70 - 99 mg/dL 199(H) 191(H) 161(H)  BUN 6 - 20 mg/dL $Remove'11 12 13  'GjfkBDD$ Creatinine 0.61 - 1.24 mg/dL 0.73 0.58(L) 0.72  Sodium 135 - 145 mmol/L 135 132(L) 132(L)  Potassium 3.5 - 5.1 mmol/L 4.1 4.3 4.2  Chloride 98 - 111 mmol/L 99 98 97(L)  CO2 22 - 32 mmol/L $RemoveB'27 28 27  'IQmgaJGl$ Calcium 8.9 - 10.3 mg/dL 8.8(L) 8.7(L) 8.6(L)  Total Protein 6.5 - 8.1 g/dL 6.9 6.7 6.9  Total Bilirubin 0.3 - 1.2 mg/dL 1.8(H) 1.9(H) 1.7(H)  Alkaline Phos 38 - 126 U/L 867(H) 895(H) 788(H)  AST 15 - 41 U/L 63(H) 70(H) 67(H)  ALT 0 - 44 U/L 35 41 29    DIAGNOSTIC IMAGING:  I have independently reviewed the scans and discussed with the patient. IR IMAGING GUIDED PORT INSERTION  Result Date: 01/24/2021 INDICATION: 60 year old male with colon cancer, requiring chemotherapy EXAM: IMPLANTED PORT A CATH PLACEMENT WITH ULTRASOUND AND FLUOROSCOPIC GUIDANCE MEDICATIONS: None.; ANESTHESIA/SEDATION: Moderate (conscious) sedation was employed during this procedure. A total of Versed 3 mg and Fentanyl 100 mcg was administered intravenously. Moderate Sedation Time: 30 minutes. The patient's level of consciousness and vital  signs were monitored continuously by radiology nursing throughout the procedure under my direct supervision. FLUOROSCOPY TIME:  0 minutes, 30 seconds (2 mGy) COMPLICATIONS: None immediate. PROCEDURE: The procedure, risks, benefits, and alternatives were explained to the patient. Questions regarding the procedure were encouraged and answered. The patient understands and consents to the procedure. The right neck and chest were prepped with chlorhexidine in a sterile fashion, and a sterile drape was applied covering the operative field. Maximum barrier sterile technique with sterile gowns and gloves were used for the procedure. A timeout was performed prior to the initiation of the procedure. Local anesthesia was provided with 1% lidocaine with epinephrine. After creating a small venotomy incision, a micropuncture kit was utilized to access the internal jugular vein under direct, real-time ultrasound guidance. Ultrasound image documentation was performed. The microwire was kinked to measure appropriate catheter length. A subcutaneous port pocket was then created along the upper chest wall utilizing a combination of sharp and blunt dissection. The pocket was irrigated with sterile saline. A single lumen ISP power injectable port was chosen for placement. The 8 Fr catheter was tunneled from the port pocket site to the venotomy incision. The port was placed in the pocket. The external catheter was trimmed to appropriate length. At the venotomy, an 8 Fr peel-away sheath was placed over a guidewire under fluoroscopic guidance. The catheter was then placed through the sheath and the sheath was removed. Final catheter positioning was confirmed and documented with a fluoroscopic spot radiograph. The port was accessed with a Huber needle, aspirated and flushed with heparinized saline. The venotomy  site was closed with an interrupted 3-0 Vicryl suture. The port pocket incision was closed with interrupted 3-0 Vicryl suture and  Dermabond and was applied to both incisions. Dressings were placed. The patient tolerated the procedure well without immediate post procedural complication. IMPRESSION: Successful placement of a right internal jugular approach power injectable Port-A-Cath. The catheter is ready for immediate use. Electronically Signed   By: Michaelle Birks MD   On: 01/24/2021 16:10     ASSESSMENT:  1.  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 - CT [facility] with contrast on 12/26/2020 showed extensive hepatic metastatic disease and retroperitoneal adenopathy.  CT chest without contrast on 12/26/2020 showed several small subcentimeter lung nodules consistent with metastatic disease.  Liver morphology suggests underlying cirrhosis. - Liver biopsy on 01/10/2021 consistent with metastatic adenocarcinoma, CK20 and CDX2 positive, negative for CK7, TTF-1, PSA and prostein. - Colonoscopy and biopsy of the sigmoid mass on 01/13/2021 consistent with adenocarcinoma. - 10 pound weight loss prior to start of therapy. - FOLFOX started on 01/25/2021. - Foundation 1 testing on 01/10/2021 showed MSI-stable, TMB low, negative for RAS/BRAF/HER2 mutation.  2.  Social/family history: - Lives at home with his wife.  He drove truck and operated equipment.  No smoking history. - Father had prostate cancer.   PLAN:  1.  Metastatic sigmoid colon cancer to liver and lungs: - He tolerated 2 cycles of FOLFOX reasonably well. - Denies any tingling or numbness neck 70s.  Denies any GI symptoms. - Reviewed labs today.  Alk phos is 499 and improved.  AST is also elevated at 47.  Total bilirubin is 1.5 and improved.  CBC was grossly normal. - I have discussed about starting him on panitumumab along with FOLFOX.  He will receive his first dose today. - He will start doxycycline 100 mg twice daily.  He will use hydrocodone cortisone cream as needed should he develop any rash. - RTC 2  weeks for follow-up.  2.  Coccygeal pain: - He developed coccygeal pain since June of this year. - Continue half tablet of MS IR 15 mg at bedtime.  3.  Diabetes: - I have reviewed his fingerstick log.  Fasting sugars are staying between 110-120.  Postprandial is staying between 200-250. - He is on NPH 70/30.  He is using 8 units if his sugar is more than 200.  He uses 5 units if it is more than 150.  4.  Difficulty falling/staying asleep: - Continue Restoril 15 mg at bedtime which is helping.  5.  Normocytic anemia: - Hemoglobin is 10.9.  Will check ferritin, iron panel at next visit.   Orders placed this encounter:  No orders of the defined types were placed in this encounter.    Derek Jack, MD [place] [phone]   I, Thana Ates, am acting as a scribe for Dr. Derek Jack.  I, Derek Jack MD, have reviewed the above documentation for accuracy and completeness, and I agree with the above.

## 2021-02-22 ENCOUNTER — Inpatient Hospital Stay (HOSPITAL_COMMUNITY): Payer: Medicaid Other

## 2021-02-22 ENCOUNTER — Inpatient Hospital Stay (HOSPITAL_BASED_OUTPATIENT_CLINIC_OR_DEPARTMENT_OTHER): Payer: Medicaid Other | Admitting: Hematology

## 2021-02-22 ENCOUNTER — Other Ambulatory Visit: Payer: Self-pay

## 2021-02-22 VITALS — BP 130/77 | HR 99 | Temp 97.1°F | Resp 18 | Wt 170.0 lb

## 2021-02-22 VITALS — BP 143/81 | HR 88 | Temp 96.9°F | Resp 17

## 2021-02-22 DIAGNOSIS — C189 Malignant neoplasm of colon, unspecified: Secondary | ICD-10-CM

## 2021-02-22 DIAGNOSIS — C787 Secondary malignant neoplasm of liver and intrahepatic bile duct: Secondary | ICD-10-CM

## 2021-02-22 DIAGNOSIS — Z5112 Encounter for antineoplastic immunotherapy: Secondary | ICD-10-CM | POA: Diagnosis not present

## 2021-02-22 LAB — CBC WITH DIFFERENTIAL/PLATELET
Abs Immature Granulocytes: 0.03 10*3/uL (ref 0.00–0.07)
Basophils Absolute: 0.1 10*3/uL (ref 0.0–0.1)
Basophils Relative: 1 %
Eosinophils Absolute: 0.3 10*3/uL (ref 0.0–0.5)
Eosinophils Relative: 3 %
HCT: 32.6 % — ABNORMAL LOW (ref 39.0–52.0)
Hemoglobin: 10.9 g/dL — ABNORMAL LOW (ref 13.0–17.0)
Immature Granulocytes: 0 %
Lymphocytes Relative: 19 %
Lymphs Abs: 1.4 10*3/uL (ref 0.7–4.0)
MCH: 30.5 pg (ref 26.0–34.0)
MCHC: 33.4 g/dL (ref 30.0–36.0)
MCV: 91.3 fL (ref 80.0–100.0)
Monocytes Absolute: 0.9 10*3/uL (ref 0.1–1.0)
Monocytes Relative: 11 %
Neutro Abs: 4.9 10*3/uL (ref 1.7–7.7)
Neutrophils Relative %: 66 %
Platelets: 176 10*3/uL (ref 150–400)
RBC: 3.57 MIL/uL — ABNORMAL LOW (ref 4.22–5.81)
RDW: 15.6 % — ABNORMAL HIGH (ref 11.5–15.5)
WBC: 7.6 10*3/uL (ref 4.0–10.5)
nRBC: 0 % (ref 0.0–0.2)

## 2021-02-22 LAB — COMPREHENSIVE METABOLIC PANEL
ALT: 26 U/L (ref 0–44)
AST: 47 U/L — ABNORMAL HIGH (ref 15–41)
Albumin: 2.9 g/dL — ABNORMAL LOW (ref 3.5–5.0)
Alkaline Phosphatase: 499 U/L — ABNORMAL HIGH (ref 38–126)
Anion gap: 7 (ref 5–15)
BUN: 7 mg/dL (ref 6–20)
CO2: 27 mmol/L (ref 22–32)
Calcium: 8.2 mg/dL — ABNORMAL LOW (ref 8.9–10.3)
Chloride: 101 mmol/L (ref 98–111)
Creatinine, Ser: 0.64 mg/dL (ref 0.61–1.24)
GFR, Estimated: >60 mL/min (ref >60)
Glucose, Bld: 124 mg/dL — ABNORMAL HIGH (ref 70–99)
Potassium: 3.7 mmol/L (ref 3.5–5.1)
Sodium: 135 mmol/L (ref 135–145)
Total Bilirubin: 1.5 mg/dL — ABNORMAL HIGH (ref 0.3–1.2)
Total Protein: 6.4 g/dL — ABNORMAL LOW (ref 6.5–8.1)

## 2021-02-22 LAB — MAGNESIUM: Magnesium: 2 mg/dL (ref 1.7–2.4)

## 2021-02-22 MED ORDER — OXALIPLATIN CHEMO INJECTION 100 MG/20ML
85.0000 mg/m2 | Freq: Once | INTRAVENOUS | Status: AC
  Administered 2021-02-22: 165 mg via INTRAVENOUS
  Filled 2021-02-22: qty 33

## 2021-02-22 MED ORDER — SODIUM CHLORIDE 0.9 % IV SOLN
6.0000 mg/kg | Freq: Once | INTRAVENOUS | Status: AC
  Administered 2021-02-22: 500 mg via INTRAVENOUS
  Filled 2021-02-22: qty 5

## 2021-02-22 MED ORDER — DEXTROSE 5 % IV SOLN: Freq: Once | INTRAVENOUS | Status: AC

## 2021-02-22 MED ORDER — FLUOROURACIL CHEMO INJECTION 5 GM/100ML
5000.0000 mg | INTRAVENOUS | Status: DC
  Administered 2021-02-22: 5000 mg via INTRAVENOUS
  Filled 2021-02-22: qty 100

## 2021-02-22 MED ORDER — LEUCOVORIN CALCIUM INJECTION 350 MG: 400.0000 mg/m2 | Freq: Once | INTRAVENOUS | Status: DC

## 2021-02-22 MED ORDER — SODIUM CHLORIDE 0.9 % IV SOLN
10.0000 mg | Freq: Once | INTRAVENOUS | Status: AC
  Administered 2021-02-22: 10 mg via INTRAVENOUS
  Filled 2021-02-22: qty 10

## 2021-02-22 MED ORDER — LEUCOVORIN CALCIUM INJECTION 350 MG
400.0000 mg/m2 | Freq: Once | INTRAVENOUS | Status: AC
  Administered 2021-02-22: 784 mg via INTRAVENOUS
  Filled 2021-02-22: qty 39.2

## 2021-02-22 MED ORDER — PALONOSETRON HCL INJECTION 0.25 MG/5ML
0.2500 mg | Freq: Once | INTRAVENOUS | Status: AC
  Administered 2021-02-22: 0.25 mg via INTRAVENOUS
  Filled 2021-02-22: qty 5

## 2021-02-22 NOTE — Patient Instructions (Addendum)
[facility]  Discharge Instructions: Thank you for choosing [facility] to provide your oncology and hematology care.  If you have a lab appointment with the [facility], please come in thru the Main Entrance and check in at the main information desk.  Wear comfortable clothing and clothing appropriate for easy access to any Portacath or PICC line.   We strive to give you quality time with your provider. You may need to reschedule your appointment if you arrive late (15 or more minutes).  Arriving late affects you and other patients whose appointments are after yours.  Also, if you miss three or more appointments without notifying the office, you may be dismissed from the clinic at the provider's discretion.      For prescription refill requests, have your pharmacy contact our office and allow 72 hours for refills to be completed.    The chemotherapy medication bag should finish at 46 hours, 96 hours, or 7 days. For example, if your pump is scheduled for 46 hours and it was put on at 4:00 p.m., it should finish at 2:00 p.m. the day it is scheduled to come off regardless of your appointment time.     Estimated time to finish at 1215   If the display on your pump reads "Low Volume" and it is beeping, take the batteries out of the pump and come to the cancer center for it to be taken off.   If the pump alarms go off prior to the pump reading "Low Volume" then call [phone] and someone can assist you.  If the plunger comes out and the chemotherapy medication is leaking out, please use your home chemo spill kit to clean up the spill. Do NOT use paper towels or other household products.  If you have problems or questions regarding your pump, please call either [phone] (24 hours a day) or the cancer center Monday-Friday 8:00 a.m.- 4:30 p.m. at the clinic number and we will assist you. If you are unable to get assistance, then go to the nearest Emergency  Department and ask the staff to contact the IV team for assistance.     To help prevent nausea and vomiting after your treatment, we encourage you to take your nausea medication as directed.  BELOW ARE SYMPTOMS THAT SHOULD BE REPORTED IMMEDIATELY: *FEVER GREATER THAN 100.4 F (38 C) OR HIGHER *CHILLS OR SWEATING *NAUSEA AND VOMITING THAT IS NOT CONTROLLED WITH YOUR NAUSEA MEDICATION *UNUSUAL SHORTNESS OF BREATH *UNUSUAL BRUISING OR BLEEDING *URINARY PROBLEMS (pain or burning when urinating, or frequent urination) *BOWEL PROBLEMS (unusual diarrhea, constipation, pain near the anus) TENDERNESS IN MOUTH AND THROAT WITH OR WITHOUT PRESENCE OF ULCERS (sore throat, sores in mouth, or a toothache) UNUSUAL RASH, SWELLING OR PAIN  UNUSUAL VAGINAL DISCHARGE OR ITCHING   Items with * indicate a potential emergency and should be followed up as soon as possible or go to the Emergency Department if any problems should occur.  Please show the CHEMOTHERAPY ALERT CARD or IMMUNOTHERAPY ALERT CARD at check-in to the Emergency Department and triage nurse.  Should you have questions after your visit or need to cancel or reschedule your appointment, please contact [facility] [phone]  and follow the prompts.  Office hours are 8:00 a.m. to 4:30 p.m. Monday - Friday. Please note that voicemails left after 4:00 p.m. may not be returned until the following business day.  We are closed weekends and major holidays. You have access to a  nurse at all times for urgent questions. Please call the main number to the clinic [phone] and follow the prompts.  For any non-urgent questions, you may also contact your provider using MyChart. We now offer e-Visits for anyone 59 and older to request care online for non-urgent symptoms. For details visit [email or web address].   Also download the MyChart app! Go to the app store, search "MyChart", open the app, select [facility], and log in with your MyChart  username and password.  Due to Covid, a mask is required upon entering the hospital/clinic. If you do not have a mask, one will be given to you upon arrival. For doctor visits, patients may have 1 support person aged 59 or older with them. For treatment visits, patients cannot have anyone with them due to current Covid guidelines and our immunocompromised population.  Marland Kitchen

## 2021-02-22 NOTE — Progress Notes (Signed)
Patient presents today for chemotherapy treatment.  Labs were reviewed by Dr. Delton Coombes during his office visit.  Ok to proceed with treatment per Dr. Delton Coombes.

## 2021-02-22 NOTE — Progress Notes (Signed)
Patients port flushed without difficulty.  Good blood return noted with no bruising or swelling noted at site.  Transparent dressing applied.  Patient remains accessed for chemotherapy treatment today.

## 2021-02-22 NOTE — Progress Notes (Signed)
Patient has been assessed, vital signs and labs have been reviewed by Dr. Katragadda. ANC, Creatinine, LFTs, and Platelets are within treatment parameters per Dr. Katragadda. The patient is good to proceed with treatment at this time. Primary RN and pharmacy aware.  

## 2021-02-22 NOTE — Patient Instructions (Addendum)
[facility] Discharge Instructions  You were seen today by Dr. Katragadda. He went over your recent results, and you received your treatment. Dr. Katragadda will see you back in 2 weeks for labs and follow up.   Thank you for choosing [facility] to provide your oncology and hematology care.  To afford each patient quality time with our provider, please arrive at least 15 minutes before your scheduled appointment time.   If you have a lab appointment with the Cancer Center please come in thru the Main Entrance and check in at the main information desk  You need to re-schedule your appointment should you arrive 10 or more minutes late.  We strive to give you quality time with our providers, and arriving late affects you and other patients whose appointments are after yours.  Also, if you no show three or more times for appointments you may be dismissed from the clinic at the providers discretion.     Again, thank you for choosing [facility].  Our hope is that these requests will decrease the amount of time that you wait before being seen by our physicians.       _____________________________________________________________  Should you have questions after your visit to [facility], please contact our office at [phone] between the hours of 8:00 a.m. and 4:30 p.m.  Voicemails left after 4:00 p.m. will not be returned until the following business day.  For prescription refill requests, have your pharmacy contact our office and allow 72 hours.    Cancer Center Support Programs:   > Cancer Support Group  2nd Tuesday of the month 1pm-2pm, Journey Room   

## 2021-02-22 NOTE — Progress Notes (Signed)
Treatment given per orders. Patient tolerated it well without problems. Vitals stable and discharged home from clinic ambulatory. Follow up as scheduled.  Ambulatory pump administered per protocol.

## 2021-02-23 LAB — CEA: CEA: 1176 ng/mL — ABNORMAL HIGH (ref 0.0–4.7)

## 2021-02-24 ENCOUNTER — Other Ambulatory Visit: Payer: Self-pay

## 2021-02-24 ENCOUNTER — Inpatient Hospital Stay (HOSPITAL_COMMUNITY): Payer: Medicaid Other

## 2021-02-24 VITALS — BP 108/65 | HR 89 | Temp 98.4°F | Resp 18

## 2021-02-24 DIAGNOSIS — C189 Malignant neoplasm of colon, unspecified: Secondary | ICD-10-CM

## 2021-02-24 DIAGNOSIS — C787 Secondary malignant neoplasm of liver and intrahepatic bile duct: Secondary | ICD-10-CM

## 2021-02-24 DIAGNOSIS — Z5112 Encounter for antineoplastic immunotherapy: Secondary | ICD-10-CM | POA: Diagnosis not present

## 2021-02-24 MED ORDER — HEPARIN SOD (PORK) LOCK FLUSH 100 UNIT/ML IV SOLN
500.0000 [IU] | Freq: Once | INTRAVENOUS | Status: AC | PRN
  Administered 2021-02-24: 500 [IU]

## 2021-02-24 MED ORDER — SODIUM CHLORIDE 0.9% FLUSH
10.0000 mL | INTRAVENOUS | Status: DC | PRN
  Administered 2021-02-24: 10 mL

## 2021-02-24 NOTE — Progress Notes (Signed)
Patients port flushed without difficulty.  Good blood return noted with no bruising or swelling noted at site.  Band aid applied.  5FU pump was discontinued.  VSS with discharge and left in satisfactory condition with no s/s of distress noted.

## 2021-02-24 NOTE — Patient Instructions (Signed)
[facility]  Discharge Instructions: Thank you for choosing [facility] to provide your oncology and hematology care.  If you have a lab appointment with the Cancer Center, please come in thru the Main Entrance and check in at the main information desk.  Wear comfortable clothing and clothing appropriate for easy access to any Portacath or PICC line.   We strive to give you quality time with your provider. You may need to reschedule your appointment if you arrive late (15 or more minutes).  Arriving late affects you and other patients whose appointments are after yours.  Also, if you miss three or more appointments without notifying the office, you may be dismissed from the clinic at the provider's discretion.      For prescription refill requests, have your pharmacy contact our office and allow 72 hours for refills to be completed.        To help prevent nausea and vomiting after your treatment, we encourage you to take your nausea medication as directed.  BELOW ARE SYMPTOMS THAT SHOULD BE REPORTED IMMEDIATELY: *FEVER GREATER THAN 100.4 F (38 C) OR HIGHER *CHILLS OR SWEATING *NAUSEA AND VOMITING THAT IS NOT CONTROLLED WITH YOUR NAUSEA MEDICATION *UNUSUAL SHORTNESS OF BREATH *UNUSUAL BRUISING OR BLEEDING *URINARY PROBLEMS (pain or burning when urinating, or frequent urination) *BOWEL PROBLEMS (unusual diarrhea, constipation, pain near the anus) TENDERNESS IN MOUTH AND THROAT WITH OR WITHOUT PRESENCE OF ULCERS (sore throat, sores in mouth, or a toothache) UNUSUAL RASH, SWELLING OR PAIN  UNUSUAL VAGINAL DISCHARGE OR ITCHING   Items with * indicate a potential emergency and should be followed up as soon as possible or go to the Emergency Department if any problems should occur.  Please show the CHEMOTHERAPY ALERT CARD or IMMUNOTHERAPY ALERT CARD at check-in to the Emergency Department and triage nurse.  Should you have questions after your visit or need to cancel  or reschedule your appointment, please contact [facility] [phone]  and follow the prompts.  Office hours are 8:00 a.m. to 4:30 p.m. Monday - Friday. Please note that voicemails left after 4:00 p.m. may not be returned until the following business day.  We are closed weekends and major holidays. You have access to a nurse at all times for urgent questions. Please call the main number to the clinic [phone] and follow the prompts.  For any non-urgent questions, you may also contact your provider using MyChart. We now offer e-Visits for anyone 18 and older to request care online for non-urgent symptoms. For details visit [email or web address].   Also download the MyChart app! Go to the app store, search "MyChart", open the app, select [facility], and log in with your MyChart username and password.  Due to Covid, a mask is required upon entering the hospital/clinic. If you do not have a mask, one will be given to you upon arrival. For doctor visits, patients may have 1 support person aged 18 or older with them. For treatment visits, patients cannot have anyone with them due to current Covid guidelines and our immunocompromised population.  

## 2021-02-27 ENCOUNTER — Inpatient Hospital Stay (HOSPITAL_BASED_OUTPATIENT_CLINIC_OR_DEPARTMENT_OTHER): Payer: Medicaid Other | Admitting: Hematology

## 2021-02-27 ENCOUNTER — Other Ambulatory Visit (HOSPITAL_COMMUNITY): Payer: Self-pay | Admitting: *Deleted

## 2021-02-27 ENCOUNTER — Encounter (HOSPITAL_COMMUNITY): Payer: Self-pay | Admitting: Hematology

## 2021-02-27 ENCOUNTER — Other Ambulatory Visit: Payer: Self-pay

## 2021-02-27 ENCOUNTER — Encounter (HOSPITAL_COMMUNITY): Payer: Self-pay

## 2021-02-27 VITALS — BP 114/69 | HR 96 | Temp 98.4°F | Resp 16 | Wt 164.7 lb

## 2021-02-27 DIAGNOSIS — C189 Malignant neoplasm of colon, unspecified: Secondary | ICD-10-CM

## 2021-02-27 DIAGNOSIS — Z5112 Encounter for antineoplastic immunotherapy: Secondary | ICD-10-CM | POA: Diagnosis not present

## 2021-02-27 DIAGNOSIS — C787 Secondary malignant neoplasm of liver and intrahepatic bile duct: Secondary | ICD-10-CM

## 2021-02-27 MED ORDER — LORAZEPAM 0.5 MG PO TABS: 0.5000 mg | ORAL_TABLET | Freq: Two times a day (BID) | ORAL | 0 refills | Status: DC | PRN

## 2021-02-27 MED ORDER — ONDANSETRON HCL 8 MG PO TABS: 8.0000 mg | ORAL_TABLET | Freq: Three times a day (TID) | ORAL | 3 refills | Status: AC | PRN

## 2021-02-27 NOTE — Progress Notes (Signed)
[place], [place] [address]   CLINIC:  Medical Oncology/Hematology  PCP:  Lucia Gaskins, MD [place] / [place] [address] [phone]   REASON FOR VISIT:  Follow-up for colon cancer metastasized to liver  PRIOR THERAPY: none  NGS Results: not done  CURRENT THERAPY: FOLFOX every 2 weeks  BRIEF ONCOLOGIC HISTORY:  Oncology History Overview Note  MMR normal Foundation One: low mutation burden   Metastasis to liver ([facility])  01/04/2021 Initial Diagnosis   Metastasis to liver ([facility])   01/25/2021 -  Chemotherapy    Patient is on Treatment Plan: COLORECTAL FOLFOX Q14D       Colon cancer metastasized to liver ([facility])  12/26/2020 Imaging   Ct abdomen and pelvis 1. Extensive hepatic metastatic disease and retroperitoneal adenopathy. 2. A 7 mm right lung base nodule, most consistent with metastatic disease. 3. No bowel obstruction. Normal appendix. 4. Aortic Atherosclerosis (ICD10-I70.0).   12/26/2020 Imaging   CT chest 1. Several small lung nodules consistent with metastatic disease. The primary malignancy remains unclear. The liver morphology suggests underlying cirrhosis, and multifocal hepatocellular carcinoma should be included in the differential diagnosis. 2. No acute abnormality in the chest.     01/04/2021 Initial Diagnosis   Carcinoma metastatic to lung [facility])   01/10/2021 Pathology Results   A. LIVER, NEEDLE CORE BIOPSY:  -  Metastatic adenocarcinoma  -  See comment   COMMENT:   The neoplastic cells are positive for cytokeratin 20 and CDX2 but negative for cytokeratin 7, TTF-1, PSA and prostein.  The combined morphology and immunophenotype are consistent with metastasis from a  gastrointestinal primary.  Dr. Saralyn Pilar reviewed the case and agrees with the above diagnosis.     01/13/2021 Procedure   Colonoscopy - Two 3 to 5 mm polyps in the transverse colon, removed with a cold snare. Resected and retrieved. -  Malignant partially obstructing tumor at 17 cm proximal to the anus. Biopsied. Tattooed. - The distal rectum and anal verge are normal on retroflexion view.   01/13/2021 Cancer Staging   Staging form: Colon and Rectum, AJCC 8th Edition - Clinical stage from 01/13/2021: Stage IVB (cTX, cN0, pM1b) - Signed by Heath Lark, MD on 01/13/2021 Stage prefix: Initial diagnosis Total positive nodes: 0   01/13/2021 Procedure   Colonoscopy - Two 3 to 5 mm polyps in the transverse colon, removed with a cold snare. Resected and retrieved. - Malignant partially obstructing tumor at 17 cm proximal to the anus. Biopsied. Tattooed. - The distal rectum and anal verge are normal on retroflexion view.   01/13/2021 Procedure   EGD - Normal esophagus. - Z-line regular, 40 cm from the incisors. - Normal stomach. - Nodular mucosa in   01/13/2021 Pathology Results   A. DUODENUM, NODULE, BIOPSY:  - Peptic duodenitis.  - No dysplasia or malignancy.   B. COLON, SIGMOID, MASS, BIOPSY:  - Invasive adenocarcinoma.   C. COLON, TRANSVERSE, POLYPECTOMY:  - Fragment of ulcerated tissue with adenocarcinoma, see comment.  - Tubular adenoma (X2 fragments).   COMMENT:   B. MMR will be ordered   01/13/2021 Tumor Marker   Patient's tumor was tested for the following markers: CEA. Results of the tumor marker test revealed 883.   01/24/2021 Procedure   Successful right-sided port placement, with the tip of the catheter in the proximal right atrium.   Plan: Catheter ready for use.   See detailed procedure note with images in PACS. The patient tolerated the  procedure well without incident or complication and was returned to Recovery in stable conditi   01/25/2021 -  Chemotherapy    Patient is on Treatment Plan: COLORECTAL FOLFOX Q14D       01/25/2021 Tumor Marker   Patient's tumor was tested for the following markers: CEA. Results of the tumor marker test revealed 1378.     CANCER STAGING: Cancer Staging Colon  cancer metastasized to liver [facility]) Staging form: Colon and Rectum, AJCC 8th Edition - Clinical stage from 01/13/2021: Stage IVB (cTX, cN0, pM1b) - Signed by Heath Lark, MD on 01/13/2021   INTERVAL HISTORY:  [name], a 60 y.o. male, returns for routine follow-up of his colon cancer metastasized to liver. Rector was last seen on 02/22/21.   Today he reports feeling fair. He reports increased anxiety since Friday (08/19) which has caused sleep disturbances. He reports nausea that he is treating with compazine.   REVIEW OF SYSTEMS:  Review of Systems  Constitutional:  Positive for appetite change (50%) and fatigue (50%).  Gastrointestinal:  Positive for nausea.  Psychiatric/Behavioral:  Positive for sleep disturbance. The patient is nervous/anxious.    PAST MEDICAL/SURGICAL HISTORY:  Past Medical History:  Diagnosis Date   Diabetes mellitus without complication ([facility])    Hypertension    Past Surgical History:  Procedure Laterality Date   BIOPSY  01/13/2021   Procedure: BIOPSY;  Surgeon: Harvel Quale, MD;  Location[facility];  Service: Gastroenterology;;   CATARACT EXTRACTION Right    COLONOSCOPY WITH PROPOFOL N/A 01/13/2021   Procedure: COLONOSCOPY WITH PROPOFOL;  Surgeon: Harvel Quale, MD;  Location: [facility];  Service: Gastroenterology;  Laterality: N/A;  11:05   ESOPHAGOGASTRODUODENOSCOPY (EGD) WITH PROPOFOL N/A 01/13/2021   Procedure: ESOPHAGOGASTRODUODENOSCOPY (EGD) WITH PROPOFOL;  Surgeon: Harvel Quale, MD;  Location: [facility];  Service: Gastroenterology;  Laterality: N/A;   IR IMAGING GUIDED PORT INSERTION  01/24/2021   LIVER BIOPSY  01/10/2021   U/S guided   POLYPECTOMY  01/13/2021   Procedure: POLYPECTOMY;  Surgeon: Harvel Quale, MD;  Location: [facility];  Service: Gastroenterology;;    SOCIAL HISTORY:  Social History   Socioeconomic History   Marital status: Married    Spouse name: Not on file    Number of children: Not on file   Years of education: Not on file   Highest education level: Not on file  Occupational History   Not on file  Tobacco Use   Smoking status: Never   Smokeless tobacco: Never  Vaping Use   Vaping Use: Never used  Substance and Sexual Activity   Alcohol use: Not Currently   Drug use: Not Currently   Sexual activity: Not Currently  Other Topics Concern   Not on file  Social History Narrative   Married, no childer   Social Determinants of Health   Financial Resource Strain: Not on file  Food Insecurity: Not on file  Transportation Needs: No Transportation Needs   Lack of Transportation (Medical): No   Lack of Transportation (Non-Medical): No  Physical Activity: Inactive   Days of Exercise per Week: 0 days   Minutes of Exercise per Session: 0 min  Stress: Not on file  Social Connections: Not on file  Intimate Partner Violence: Not At Risk   Fear of Current or Ex-Partner: No   Emotionally Abused: No   Physically Abused: No   Sexually Abused: No    FAMILY HISTORY:  Family History  Problem Relation Age  of Onset   Cancer Father 18       prostate ca    CURRENT MEDICATIONS:  Current Outpatient Medications  Medication Sig Dispense Refill   acetaminophen (TYLENOL) 500 MG tablet Take 1,000 mg by mouth every 6 (six) hours as needed for moderate pain.     atenolol (TENORMIN) 50 MG tablet Take 50 mg by mouth daily.     doxycycline (VIBRA-TABS) 100 MG tablet Take 1 tablet (100 mg total) by mouth 2 (two) times daily. 60 tablet 6   fluorouracil CALGB 16109 2,400 mg/m2 in sodium chloride 0.9 % 150 mL Inject 2,400 mg/m2 into the vein over 96 hr.     hydrocortisone 1 % ointment Apply 1 application topically 2 (two) times daily. 60 g 6   insulin NPH-regular Human (70-30) 100 UNIT/ML injection Inject 10-12 Units into the skin 2 (two) times daily.     LEUCOVORIN CALCIUM IV Inject 400 mg/m2 into the vein every 14 (fourteen) days.     lidocaine-prilocaine  (EMLA) cream Apply to affected area once 30 g 3   LORazepam (ATIVAN) 0.5 MG tablet Take 1 tablet (0.5 mg total) by mouth 2 (two) times daily as needed for anxiety. 30 tablet 0   morphine (MSIR) 15 MG tablet Take 1 tablet (15 mg total) by mouth every 6 (six) hours as needed for severe pain. 60 tablet 0   Multiple Vitamin (MULTIVITAMIN WITH MINERALS) TABS tablet Take 1 tablet by mouth daily.     omeprazole (PRILOSEC) 40 MG capsule Take 1 capsule (40 mg total) by mouth daily. 90 capsule 3   ondansetron (ZOFRAN) 8 MG tablet Take 1 tablet (8 mg total) by mouth every 8 (eight) hours as needed for nausea. 30 tablet 3   OXALIPLATIN IV Inject 85 mg/m2 into the vein every 14 (fourteen) days.     polyethylene glycol (MIRALAX) 17 g packet Take 17 g by mouth daily. 30 each 3   prochlorperazine (COMPAZINE) 5 MG tablet Take 1 tablet (5 mg total) by mouth every 6 (six) hours as needed for nausea or vomiting. 30 tablet 3   simvastatin (ZOCOR) 20 MG tablet SMARTSIG:1 Tablet(s) By Mouth Every Evening     temazepam (RESTORIL) 15 MG capsule TAKE 1 CAPSULE BY MOUTH AT BEDTIME AS NEEDED FOR SLEEP 30 capsule 0   No current facility-administered medications for this visit.    ALLERGIES:  No Known Allergies  PHYSICAL EXAM:  Performance status (ECOG): 1 - Symptomatic but completely ambulatory  There were no vitals filed for this visit. Wt Readings from Last 3 Encounters:  02/22/21 170 lb (77.1 kg)  02/08/21 171 lb 6.4 oz (77.7 kg)  02/01/21 172 lb 6.4 oz (78.2 kg)   Physical Exam Vitals reviewed.  Constitutional:      Appearance: Normal appearance.  Neurological:     General: No focal deficit present.     Mental Status: He is alert and oriented to person, place, and time.  Psychiatric:        Mood and Affect: Mood normal.        Behavior: Behavior normal.     LABORATORY DATA:  I have reviewed the labs as listed.  CBC Latest Ref Rng & Units 02/22/2021 02/08/2021 02/01/2021  WBC 4.0 - 10.5 K/uL 7.6 8.4 8.7   Hemoglobin 13.0 - 17.0 g/dL 10.9(L) 10.7(L) 10.7(L)  Hematocrit 39.0 - 52.0 % 32.6(L) 32.9(L) 31.5(L)  Platelets 150 - 400 K/uL 176 238 257   CMP Latest Ref Rng & Units 02/22/2021 02/08/2021  02/01/2021  Glucose 70 - 99 mg/dL 124(H) 199(H) 191(H)  BUN 6 - 20 mg/dL _0 Creatinine 0.61 - 1.24 mg/dL 0.64 0.73 0.58(L)  Sodium 135 - 145 mmol/L 135 135 132(L)  Potassium 3.5 - 5.1 mmol/L 3.7 4.1 4.3  Chloride 98 - 111 mmol/L 101 99 98  CO2 22 - 32 mmol/L _1 Calcium 8.9 - 10.3 mg/dL 8.2(L) 8.8(L) 8.7(L)  Total Protein 6.5 - 8.1 g/dL 6.4(L) 6.9 6.7  Total Bilirubin 0.3 - 1.2 mg/dL 1.5(H) 1.8(H) 1.9(H)  Alkaline Phos 38 - 126 U/L 499(H) 867(H) 895(H)  AST 15 - 41 U/L 47(H) 63(H) 70(H)  ALT 0 - 44 U/L 26 35 41    DIAGNOSTIC IMAGING:  I have independently reviewed the scans and discussed with the patient. No results found.   ASSESSMENT:  1.  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 - CT [facility] with contrast on 12/26/2020 showed extensive hepatic metastatic disease and retroperitoneal adenopathy.  CT chest without contrast on 12/26/2020 showed several small subcentimeter lung nodules consistent with metastatic disease.  Liver morphology suggests underlying cirrhosis. - Liver biopsy on 01/10/2021 consistent with metastatic adenocarcinoma, CK20 and CDX2 positive, negative for CK7, TTF-1, PSA and prostein. - Colonoscopy and biopsy of the sigmoid mass on 01/13/2021 consistent with adenocarcinoma. - 10 pound weight loss prior to start of therapy. - FOLFOX started on 01/25/2021. - Foundation 1 testing on 01/10/2021 showed MSI-stable, TMB low, negative for RAS/BRAF/HER2 mutation.  2.  Social/family history: - Lives at home with his wife.  He drove truck and operated equipment.  No smoking history. - Father had prostate cancer.   PLAN:  1.  Metastatic sigmoid colon cancer to liver and lungs: - Cycle 3 of FOLFOX on  02/22/2021.  Vectibix was added to the regimen. - Today seen on an unscheduled visit due to development of anxiety, restlessness/jitteriness which started Friday after the pump was discontinued.  Only change was he started taking Compazine twice daily on Friday.  He never had a history of anxiety episodes in the past. - He will see [place] back next week prior to cycle 4.  2.  Coccygeal pain: - Continue half tablet MS IR 15 mg at bedtime.  3.  Diabetes: - Continue NPH 70/30, 8 units if sugar is more than 205 units if it is more than 150.  4.  Difficulty falling/staying asleep: - Continue Restoril 15 mg at bedtime.  5.  Normocytic anemia: - We will check ferritin and iron panel at next visit.  6.  Anxiety/jitteriness: - He reported anxiety since Friday of last week when the pump was discontinued. - He is taking Compazine 10 mg twice daily for nausea/vomiting. - Compazine most likely causing his jitteriness. - We will discontinue Compazine.  He will take Zofran 8 mg every 8 hours as needed. - I have started him on Ativan 0.5 mg twice daily as needed.  He took the first pill this morning at 10:00 which has helped his anxiety.   Orders placed this encounter:  No orders of the defined types were placed in this encounter.    Derek Jack, MD [place] [phone]   I, Thana Ates, am acting as a scribe for Dr. Derek Jack.  I, Derek Jack MD, have reviewed the above documentation for accuracy and completeness, and I agree with the above.

## 2021-02-27 NOTE — Addendum Note (Signed)
Addended by: Derek Jack on: 02/27/2021 09:11 AM   Modules accepted: Orders

## 2021-02-27 NOTE — Progress Notes (Signed)
Patient called the clinic this morning reporting that he feels anxious, jittery, and feels like he constantly has to move. Spoke with Dr. Delton Coombes who ordered Ativan 0.'5mg'$  BID PRN. Unable to call patient back with recommendations prior to his arrival at the [facility] around [place]. Patient newly reporting nausea at that time. Patient and son advised of Dr. Tomie China previous recommendations and scheduled for symptom management visit with Dr. Delton Coombes at [phone]. Patient and son to report to pharmacy for previously prescribed medication and return to clinic following that.

## 2021-02-27 NOTE — Patient Instructions (Signed)
[place] at [facility] Discharge Instructions  You were seen today by Dr. Delton Coombes. He went over your recent results. Stop taking Compazine; instead use Zofran to treat your nausea as needed. Dr. Delton Coombes will see you back in at your next appointment for labs and follow up.   Thank you for choosing [place] at [facility] to provide your oncology and hematology care.  To afford each patient quality time with our provider, please arrive at least 15 minutes before your scheduled appointment time.   If you have a lab appointment with the [facility] please come in thru the Main Entrance and check in at the main information desk  You need to re-schedule your appointment should you arrive 10 or more minutes late.  We strive to give you quality time with our providers, and arriving late affects you and other patients whose appointments are after yours.  Also, if you no show three or more times for appointments you may be dismissed from the clinic at the providers discretion.     Again, thank you for choosing [facility].  Our hope is that these requests will decrease the amount of time that you wait before being seen by our physicians.       _____________________________________________________________  Should you have questions after your visit to [facility], please contact our office at [phone] between the hours of 8:00 a.m. and 4:30 p.m.  Voicemails left after 4:00 p.m. will not be returned until the following business day.  For prescription refill requests, have your pharmacy contact our office and allow 72 hours.    Cancer Center Support Programs:   > Cancer Support Group  2nd Tuesday of the month 1pm-2pm, Journey Room

## 2021-03-07 NOTE — Progress Notes (Signed)
Henry Johns, Henry Johns   CLINIC:  Medical Oncology/Hematology  PCP:  Lucia Gaskins, MD [place] /  [place] [phone]   REASON FOR VISIT:  Follow-up for colon caner metastasized to liver  PRIOR THERAPY: none  NGS Results: not done  CURRENT THERAPY: FOLFOX every 2 weeks  BRIEF ONCOLOGIC HISTORY:  Oncology History Overview Note  MMR normal Foundation One: low mutation burden   Metastasis to liver ([place])  01/04/2021 Initial Diagnosis   Metastasis to liver ([place])   01/25/2021 -  Chemotherapy    Patient is on Treatment Plan: COLORECTAL FOLFOX Q14D       Colon cancer metastasized to liver ([place])  12/26/2020 Imaging   Ct abdomen and pelvis 1. Extensive hepatic metastatic disease and retroperitoneal adenopathy. 2. A 7 mm right lung base nodule, most consistent with metastatic disease. 3. No bowel obstruction. Normal appendix. 4. Aortic Atherosclerosis (ICD10-I70.0).   12/26/2020 Imaging   CT chest 1. Several small lung nodules consistent with metastatic disease. The primary malignancy remains unclear. The liver morphology suggests underlying cirrhosis, and multifocal hepatocellular carcinoma should be included in the differential diagnosis. 2. No acute abnormality in the chest.     01/04/2021 Initial Diagnosis   Carcinoma metastatic to lung [facility])   01/10/2021 Pathology Results   A. LIVER, NEEDLE CORE BIOPSY:  -  Metastatic adenocarcinoma  -  See comment   COMMENT:   The neoplastic cells are positive for cytokeratin 20 and CDX2 but negative for cytokeratin 7, TTF-1, PSA and prostein.  The combined morphology and immunophenotype are consistent with metastasis from a  gastrointestinal primary.  Dr. Saralyn Pilar reviewed the case and agrees with the above diagnosis.     01/13/2021 Procedure   Colonoscopy - Two 3 to 5 mm polyps in the transverse colon, removed with a cold snare. Resected and retrieved. -  Malignant partially obstructing tumor at 17 cm proximal to the anus. Biopsied. Tattooed. - The distal rectum and anal verge are normal on retroflexion view.   01/13/2021 Cancer Staging   Staging form: Colon and Rectum, AJCC 8th Edition - Clinical stage from 01/13/2021: Stage IVB (cTX, cN0, pM1b) - Signed by Heath Lark, MD on 01/13/2021 Stage prefix: Initial diagnosis Total positive nodes: 0   01/13/2021 Procedure   Colonoscopy - Two 3 to 5 mm polyps in the transverse colon, removed with a cold snare. Resected and retrieved. - Malignant partially obstructing tumor at 17 cm proximal to the anus. Biopsied. Tattooed. - The distal rectum and anal verge are normal on retroflexion view.   01/13/2021 Procedure   EGD - Normal esophagus. - Z-line regular, 40 cm from the incisors. - Normal stomach. - Nodular mucosa in   01/13/2021 Pathology Results   A. DUODENUM, NODULE, BIOPSY:  - Peptic duodenitis.  - No dysplasia or malignancy.   B. COLON, SIGMOID, MASS, BIOPSY:  - Invasive adenocarcinoma.   C. COLON, TRANSVERSE, POLYPECTOMY:  - Fragment of ulcerated tissue with adenocarcinoma, see comment.  - Tubular adenoma (X2 fragments).   COMMENT:   B. MMR will be ordered   01/13/2021 Tumor Marker   Patient's tumor was tested for the following markers: CEA. Results of the tumor marker test revealed 883.   01/24/2021 Procedure   Successful right-sided port placement, with the tip of the catheter in the proximal right atrium.   Plan: Catheter ready for use.   See detailed procedure note with images in PACS. The patient tolerated the  procedure well without incident or complication and was returned to Recovery in stable conditi   01/25/2021 -  Chemotherapy    Patient is on Treatment Plan: COLORECTAL FOLFOX Q14D       01/25/2021 Tumor Marker   Patient's tumor was tested for the following markers: CEA. Results of the tumor marker test revealed 1378.     CANCER STAGING: Cancer Staging Colon  cancer metastasized to liver [facility]) Staging form: Colon and Rectum, AJCC 8th Edition - Clinical stage from 01/13/2021: Stage IVB (cTX, cN0, pM1b) - Signed by Heath Lark, MD on 01/13/2021   INTERVAL HISTORY:  Henry Johns, a 60 y.o. male, returns for routine follow-up and consideration for next cycle of chemotherapy. Henry Johns was last seen on 02/27/2021.  Due for cycle #4 of FOLFOX today.   Overall, he tells me he has been feeling pretty well. He reports severe fatigue for the 2 weeks following treatments as well as cold-sensitivity. He is able to do all of his normal at home activities without assistance. His restlessness has resolved since starting Ativan. He reports a rash over his nose, and he reports improvement to the pain in his coccyx. He reports a rash on his ankles that was present prior to treatment, and has worsened since starting treatment.  Overall, he feels ready for next cycle of chemo today.   REVIEW OF SYSTEMS:  Review of Systems  Constitutional:  Negative for appetite change and fatigue (80%).  Skin:  Positive for rash.  Neurological:  Positive for dizziness.  All other systems reviewed and are negative.  PAST MEDICAL/SURGICAL HISTORY:  Past Medical History:  Diagnosis Date   Diabetes mellitus without complication ([place])    Hypertension    Past Surgical History:  Procedure Laterality Date   BIOPSY  01/13/2021   Procedure: BIOPSY;  Surgeon: Harvel Quale, MD;  [facility];  Service: Gastroenterology;;   CATARACT EXTRACTION Right    COLONOSCOPY WITH PROPOFOL N/A 01/13/2021   Procedure: COLONOSCOPY WITH PROPOFOL;  Surgeon: Harvel Quale, MD;  Location: [facility];  Service: Gastroenterology;  Laterality: N/A;  11:05   ESOPHAGOGASTRODUODENOSCOPY (EGD) WITH PROPOFOL N/A 01/13/2021   Procedure: ESOPHAGOGASTRODUODENOSCOPY (EGD) WITH PROPOFOL;  Surgeon: Harvel Quale, MD;  Location: [facility];  Service: Gastroenterology;   Laterality: N/A;   IR IMAGING GUIDED PORT INSERTION  01/24/2021   LIVER BIOPSY  01/10/2021   U/S guided   POLYPECTOMY  01/13/2021   Procedure: POLYPECTOMY;  Surgeon: Harvel Quale, MD;  Location: [facility];  Service: Gastroenterology;;    SOCIAL HISTORY:  Social History   Socioeconomic History   Marital status: Married    Spouse name: Not on file   Number of children: Not on file   Years of education: Not on file   Highest education level: Not on file  Occupational History   Not on file  Tobacco Use   Smoking status: Never   Smokeless tobacco: Never  Vaping Use   Vaping Use: Never used  Substance and Sexual Activity   Alcohol use: Not Currently   Drug use: Not Currently   Sexual activity: Not Currently  Other Topics Concern   Not on file  Social History Narrative   Married, no childer   Social Determinants of Health   Financial Resource Strain: Not on file  Food Insecurity: Not on file  Transportation Needs: No Transportation Needs   Lack of Transportation (Medical): No   Lack of Transportation (Non-Medical): No  Physical Activity: Inactive   Days of Exercise per Week: 0 days   Minutes of Exercise per Session: 0 min  Stress: Not on file  Social Connections: Not on file  Intimate Partner Violence: Not At Risk   Fear of Current or Ex-Partner: No   Emotionally Abused: No   Physically Abused: No   Sexually Abused: No    FAMILY HISTORY:  Family History  Problem Relation Age of Onset   Cancer Father 1       prostate ca    CURRENT MEDICATIONS:  Current Outpatient Medications  Medication Sig Dispense Refill   atenolol (TENORMIN) 50 MG tablet Take 50 mg by mouth daily.     doxycycline (VIBRA-TABS) 100 MG tablet Take 1 tablet (100 mg total) by mouth 2 (two) times daily. 60 tablet 6   fluorouracil CALGB 75170 2,400 mg/m2 in sodium chloride 0.9 % 150 mL Inject 2,400 mg/m2 into the vein over 96 hr.     hydrocortisone 1 % ointment Apply 1 application  topically 2 (two) times daily. 60 g 6   insulin NPH-regular Human (70-30) 100 UNIT/ML injection Inject 10-12 Units into the skin 2 (two) times daily.     LEUCOVORIN CALCIUM IV Inject 400 mg/m2 into the vein every 14 (fourteen) days.     lidocaine-prilocaine (EMLA) cream Apply to affected area once 30 g 3   Multiple Vitamin (MULTIVITAMIN WITH MINERALS) TABS tablet Take 1 tablet by mouth daily.     omeprazole (PRILOSEC) 40 MG capsule Take 1 capsule (40 mg total) by mouth daily. 90 capsule 3   ondansetron (ZOFRAN) 8 MG tablet Take 1 tablet (8 mg total) by mouth every 8 (eight) hours as needed for nausea. 60 tablet 3   OXALIPLATIN IV Inject 85 mg/m2 into the vein every 14 (fourteen) days.     polyethylene glycol (MIRALAX) 17 g packet Take 17 g by mouth daily. 30 each 3   simvastatin (ZOCOR) 20 MG tablet SMARTSIG:1 Tablet(s) By Mouth Every Evening     acetaminophen (TYLENOL) 500 MG tablet Take 1,000 mg by mouth every 6 (six) hours as needed for moderate pain. (Patient not taking: Reported on 03/08/2021)     LORazepam (ATIVAN) 0.5 MG tablet Take 1 tablet (0.5 mg total) by mouth 2 (two) times daily as needed for anxiety. (Patient not taking: Reported on 03/08/2021) 30 tablet 0   morphine (MSIR) 15 MG tablet Take 1 tablet (15 mg total) by mouth every 6 (six) hours as needed for severe pain. (Patient not taking: Reported on 03/08/2021) 60 tablet 0   temazepam (RESTORIL) 15 MG capsule TAKE 1 CAPSULE BY MOUTH AT BEDTIME AS NEEDED FOR SLEEP (Patient not taking: Reported on 03/08/2021) 30 capsule 0   No current facility-administered medications for this visit.    ALLERGIES:  No Known Allergies  PHYSICAL EXAM:  Performance status (ECOG): 1 - Symptomatic but completely ambulatory  Vitals:   03/08/21 0837  BP: 116/73  Pulse: 94  Resp: 18  Temp: (!) 96.7 F (35.9 C)  SpO2: 100%   Wt Readings from Last 3 Encounters:  03/08/21 166 lb 9.6 oz (75.6 kg)  02/27/21 164 lb 11.2 oz (74.7 kg)  02/22/21 170 lb  (77.1 kg)   Physical Exam Vitals reviewed.  Constitutional:      Appearance: Normal appearance.  Cardiovascular:     Rate and Rhythm: Normal rate and regular rhythm.     Pulses: Normal pulses.     Heart sounds: Normal heart sounds.  Pulmonary:  Effort: Pulmonary effort is normal.     Breath sounds: Normal breath sounds.  Skin:    Findings: Rash (over the nose) present. Rash is scaling (ankles).  Neurological:     General: No focal deficit present.     Mental Status: He is alert and oriented to person, place, and time.  Psychiatric:        Mood and Affect: Mood normal.        Behavior: Behavior normal.       LABORATORY DATA:  I have reviewed the labs as listed.  CBC Latest Ref Rng & Units 03/08/2021 02/22/2021 02/08/2021  WBC 4.0 - 10.5 K/uL 5.5 7.6 8.4  Hemoglobin 13.0 - 17.0 g/dL 11.8(L) 10.9(L) 10.7(L)  Hematocrit 39.0 - 52.0 % 34.8(L) 32.6(L) 32.9(L)  Platelets 150 - 400 K/uL 121(L) 176 238   CMP Latest Ref Rng & Units 03/08/2021 02/22/2021 02/08/2021  Glucose 70 - 99 mg/dL 139(H) 124(H) 199(H)  BUN 6 - 20 mg/dL _0 Creatinine 0.61 - 1.24 mg/dL 0.68 0.64 0.73  Sodium 135 - 145 mmol/L 134(L) 135 135  Potassium 3.5 - 5.1 mmol/L 3.7 3.7 4.1  Chloride 98 - 111 mmol/L 102 101 99  CO2 22 - 32 mmol/L _1 Calcium 8.9 - 10.3 mg/dL 8.5(L) 8.2(L) 8.8(L)  Total Protein 6.5 - 8.1 g/dL 6.3(L) 6.4(L) 6.9  Total Bilirubin 0.3 - 1.2 mg/dL 1.1 1.5(H) 1.8(H)  Alkaline Phos 38 - 126 U/L 465(H) 499(H) 867(H)  AST 15 - 41 U/L 54(H) 47(H) 63(H)  ALT 0 - 44 U/L 31 26 35    DIAGNOSTIC IMAGING:  I have independently reviewed the scans and discussed with the patient. No results found.   ASSESSMENT:  1.  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 - CT [facility] with contrast on 12/26/2020 showed extensive hepatic metastatic disease and retroperitoneal adenopathy.  CT chest without contrast on 12/26/2020 showed  several small subcentimeter lung nodules consistent with metastatic disease.  Liver morphology suggests underlying cirrhosis. - Liver biopsy on 01/10/2021 consistent with metastatic adenocarcinoma, CK20 and CDX2 positive, negative for CK7, TTF-1, PSA and prostein. - Colonoscopy and biopsy of the sigmoid mass on 01/13/2021 consistent with adenocarcinoma. - 10 pound weight loss prior to start of therapy. - FOLFOX started on 01/25/2021. - Foundation 1 testing on 01/10/2021 showed MSI-stable, TMB low, negative for RAS/BRAF/HER2 mutation.  2.  Social/family history: - Lives at home with his wife.  He drove truck and operated equipment.  No smoking history. - Father had prostate cancer.   PLAN:  1.  Metastatic sigmoid colon cancer to liver and lungs: - He has tolerated last cycle of chemotherapy reasonably well. - He complains of tiredness but able to do all his ADLs and IADLs. - He gained about 4 pounds. - Reviewed his labs today which showed AST 54.  Alk phos is 465.  Rest of the LFTs were normal.  CBC was grossly adequate to proceed with next cycle of FOLFOX and Vectibix. - Reviewed last CEA level improved to 1176 from 1378. - RTC 2 weeks for follow-up.  2.  Coccygeal pain: - Coccygeal pain is improving since start of chemotherapy. - Continue half tablet of MS IR 15 mg at bedtime.  3.  Diabetes: - Continue NPH 70/30, 8 units if his blood sugar more than 205 units if its more than 150.  4.  Difficulty falling/staying asleep: - Continue Restoril 15 mg at  bedtime which is helping.  5.  Normocytic anemia: - Hemoglobin today improved 11.8.  Ferritin was 137 and percent saturation 26. - No indication for parenteral iron therapy at this time.  6.  Erythematous maculopapular rash: - He has a rash above his ankles, which was present prior to chemotherapy but has slightly worsened since last cycle. - Recommend follow-up with the dermatology.  7.  EGFR antibody induced rash: - He has mild erythema  over the malar regions and nose.  He also has some mild rash on the upper back. - Continue doxycycline 100 mg twice daily.  Continue steroid cream twice daily.  8.  Jitteriness/anxiety: - He developed jitteriness after taking Compazine.  He will continue Zofran for nausea. - Continue Ativan 0.5 mg twice daily as needed which is helping.   Orders placed this encounter:  No orders of the defined types were placed in this encounter.    Derek Jack, MD [place] [phone]   I, Thana Ates, am acting as a scribe for Dr. Derek Jack.  I, Derek Jack MD, have reviewed the above documentation for accuracy and completeness, and I agree with the above.

## 2021-03-08 ENCOUNTER — Inpatient Hospital Stay (HOSPITAL_BASED_OUTPATIENT_CLINIC_OR_DEPARTMENT_OTHER): Payer: Medicaid Other | Admitting: Hematology

## 2021-03-08 ENCOUNTER — Other Ambulatory Visit: Payer: Self-pay

## 2021-03-08 ENCOUNTER — Inpatient Hospital Stay (HOSPITAL_COMMUNITY): Payer: Medicaid Other

## 2021-03-08 VITALS — BP 116/73 | HR 94 | Temp 96.7°F | Resp 18 | Wt 166.6 lb

## 2021-03-08 VITALS — BP 138/77 | HR 79 | Temp 96.8°F | Resp 18

## 2021-03-08 DIAGNOSIS — C787 Secondary malignant neoplasm of liver and intrahepatic bile duct: Secondary | ICD-10-CM

## 2021-03-08 DIAGNOSIS — C189 Malignant neoplasm of colon, unspecified: Secondary | ICD-10-CM

## 2021-03-08 DIAGNOSIS — Z5112 Encounter for antineoplastic immunotherapy: Secondary | ICD-10-CM | POA: Diagnosis not present

## 2021-03-08 LAB — CBC WITH DIFFERENTIAL/PLATELET
Abs Immature Granulocytes: 0.06 10*3/uL (ref 0.00–0.07)
Basophils Absolute: 0.1 10*3/uL (ref 0.0–0.1)
Basophils Relative: 2 %
Eosinophils Absolute: 0.2 10*3/uL (ref 0.0–0.5)
Eosinophils Relative: 3 %
HCT: 34.8 % — ABNORMAL LOW (ref 39.0–52.0)
Hemoglobin: 11.8 g/dL — ABNORMAL LOW (ref 13.0–17.0)
Immature Granulocytes: 1 %
Lymphocytes Relative: 34 %
Lymphs Abs: 1.9 10*3/uL (ref 0.7–4.0)
MCH: 31.1 pg (ref 26.0–34.0)
MCHC: 33.9 g/dL (ref 30.0–36.0)
MCV: 91.6 fL (ref 80.0–100.0)
Monocytes Absolute: 0.7 10*3/uL (ref 0.1–1.0)
Monocytes Relative: 12 %
Neutro Abs: 2.6 10*3/uL (ref 1.7–7.7)
Neutrophils Relative %: 48 %
Platelets: 121 10*3/uL — ABNORMAL LOW (ref 150–400)
RBC: 3.8 MIL/uL — ABNORMAL LOW (ref 4.22–5.81)
RDW: 16.2 % — ABNORMAL HIGH (ref 11.5–15.5)
WBC: 5.5 10*3/uL (ref 4.0–10.5)
nRBC: 0 % (ref 0.0–0.2)

## 2021-03-08 LAB — COMPREHENSIVE METABOLIC PANEL
ALT: 31 U/L (ref 0–44)
AST: 54 U/L — ABNORMAL HIGH (ref 15–41)
Albumin: 3 g/dL — ABNORMAL LOW (ref 3.5–5.0)
Alkaline Phosphatase: 465 U/L — ABNORMAL HIGH (ref 38–126)
Anion gap: 8 (ref 5–15)
BUN: 10 mg/dL (ref 6–20)
CO2: 24 mmol/L (ref 22–32)
Calcium: 8.5 mg/dL — ABNORMAL LOW (ref 8.9–10.3)
Chloride: 102 mmol/L (ref 98–111)
Creatinine, Ser: 0.68 mg/dL (ref 0.61–1.24)
GFR, Estimated: >60 mL/min (ref >60)
Glucose, Bld: 139 mg/dL — ABNORMAL HIGH (ref 70–99)
Potassium: 3.7 mmol/L (ref 3.5–5.1)
Sodium: 134 mmol/L — ABNORMAL LOW (ref 135–145)
Total Bilirubin: 1.1 mg/dL (ref 0.3–1.2)
Total Protein: 6.3 g/dL — ABNORMAL LOW (ref 6.5–8.1)

## 2021-03-08 LAB — IRON AND TIBC
Iron: 75 ug/dL (ref 45–182)
Saturation Ratios: 26 % (ref 17.9–39.5)
TIBC: 289 ug/dL (ref 250–450)
UIBC: 214 ug/dL

## 2021-03-08 LAB — FERRITIN: Ferritin: 137 ng/mL (ref 24–336)

## 2021-03-08 LAB — MAGNESIUM: Magnesium: 1.8 mg/dL (ref 1.7–2.4)

## 2021-03-08 MED ORDER — DEXTROSE 5 % IV SOLN: Freq: Once | INTRAVENOUS | Status: AC

## 2021-03-08 MED ORDER — LEUCOVORIN CALCIUM INJECTION 350 MG
400.0000 mg/m2 | Freq: Once | INTRAVENOUS | Status: AC
  Administered 2021-03-08: 784 mg via INTRAVENOUS
  Filled 2021-03-08: qty 39.2

## 2021-03-08 MED ORDER — OXALIPLATIN CHEMO INJECTION 100 MG/20ML
85.0000 mg/m2 | Freq: Once | INTRAVENOUS | Status: AC
  Administered 2021-03-08: 165 mg via INTRAVENOUS
  Filled 2021-03-08: qty 33

## 2021-03-08 MED ORDER — PALONOSETRON HCL INJECTION 0.25 MG/5ML
0.2500 mg | Freq: Once | INTRAVENOUS | Status: AC
  Administered 2021-03-08: 0.25 mg via INTRAVENOUS
  Filled 2021-03-08: qty 5

## 2021-03-08 MED ORDER — SODIUM CHLORIDE 0.9 % IV SOLN
10.0000 mg | Freq: Once | INTRAVENOUS | Status: AC
  Administered 2021-03-08: 10 mg via INTRAVENOUS
  Filled 2021-03-08: qty 10

## 2021-03-08 MED ORDER — SODIUM CHLORIDE 0.9 % IV SOLN
6.0000 mg/kg | Freq: Once | INTRAVENOUS | Status: AC
  Administered 2021-03-08: 500 mg via INTRAVENOUS
  Filled 2021-03-08: qty 5

## 2021-03-08 MED ORDER — MORPHINE SULFATE 15 MG PO TABS
7.5000 mg | ORAL_TABLET | Freq: Once | ORAL | Status: AC
  Administered 2021-03-08: 7.5 mg via ORAL
  Filled 2021-03-08 (×2): qty 1

## 2021-03-08 MED ORDER — SODIUM CHLORIDE 0.9 % IV SOLN
5000.0000 mg | INTRAVENOUS | Status: DC
  Administered 2021-03-08: 5000 mg via INTRAVENOUS
  Filled 2021-03-08: qty 100

## 2021-03-08 NOTE — Progress Notes (Signed)
Patient has been examined, vital signs and labs have been reviewed by Dr. Katragadda. ANC, Creatinine, LFTs, hemoglobin, and platelets are within treatment parameters per Dr. Katragadda. Patient is okay to proceed with treatment per M.D.   

## 2021-03-08 NOTE — Patient Instructions (Signed)
[facility]  Discharge Instructions: Thank you for choosing [facility] to provide your oncology and hematology care.  If you have a lab appointment with the Cancer Center, please come in thru the Main Entrance and check in at the main information desk.  Wear comfortable clothing and clothing appropriate for easy access to any Portacath or PICC line.   We strive to give you quality time with your provider. You may need to reschedule your appointment if you arrive late (15 or more minutes).  Arriving late affects you and other patients whose appointments are after yours.  Also, if you miss three or more appointments without notifying the office, you may be dismissed from the clinic at the provider's discretion.      For prescription refill requests, have your pharmacy contact our office and allow 72 hours for refills to be completed.        To help prevent nausea and vomiting after your treatment, we encourage you to take your nausea medication as directed.  BELOW ARE SYMPTOMS THAT SHOULD BE REPORTED IMMEDIATELY: *FEVER GREATER THAN 100.4 F (38 C) OR HIGHER *CHILLS OR SWEATING *NAUSEA AND VOMITING THAT IS NOT CONTROLLED WITH YOUR NAUSEA MEDICATION *UNUSUAL SHORTNESS OF BREATH *UNUSUAL BRUISING OR BLEEDING *URINARY PROBLEMS (pain or burning when urinating, or frequent urination) *BOWEL PROBLEMS (unusual diarrhea, constipation, pain near the anus) TENDERNESS IN MOUTH AND THROAT WITH OR WITHOUT PRESENCE OF ULCERS (sore throat, sores in mouth, or a toothache) UNUSUAL RASH, SWELLING OR PAIN  UNUSUAL VAGINAL DISCHARGE OR ITCHING   Items with * indicate a potential emergency and should be followed up as soon as possible or go to the Emergency Department if any problems should occur.  Please show the CHEMOTHERAPY ALERT CARD or IMMUNOTHERAPY ALERT CARD at check-in to the Emergency Department and triage nurse.  Should you have questions after your visit or need to cancel  or reschedule your appointment, please contact [facility] [phone]  and follow the prompts.  Office hours are 8:00 a.m. to 4:30 p.m. Monday - Friday. Please note that voicemails left after 4:00 p.m. may not be returned until the following business day.  We are closed weekends and major holidays. You have access to a nurse at all times for urgent questions. Please call the main number to the clinic [phone] and follow the prompts.  For any non-urgent questions, you may also contact your provider using MyChart. We now offer e-Visits for anyone 18 and older to request care online for non-urgent symptoms. For details visit [email or web address].   Also download the MyChart app! Go to the app store, search "MyChart", open the app, select [place], and log in with your MyChart username and password.  Due to Covid, a mask is required upon entering the hospital/clinic. If you do not have a mask, one will be given to you upon arrival. For doctor visits, patients may have 1 support person aged 18 or older with them. For treatment visits, patients cannot have anyone with them due to current Covid guidelines and our immunocompromised population.  

## 2021-03-08 NOTE — Progress Notes (Signed)
Patient called out for pain medication for back pain. MD notified and orders received  for MSIR 7.'5mg'$  PO x one dose.   Main pharmacy dispensed medication and unable to witness at the pyxis in [place]. Dose verified by 2nd RN at bedside. Waste verified in the [facility]  under comment section per [facility] Registered Pharmacist. Margy Clarks dose disposed in the stericycle and verified by Leitha Schuller [facility].

## 2021-03-08 NOTE — Patient Instructions (Addendum)
Henry Johns at [facility] Discharge Instructions  You were seen today by Dr. Delton Coombes. He went over your recent results, and you received your treatment, and you received your treatment. Dr. Delton Coombes will see you back in 2 weeks for labs and follow up.   Thank you for choosing [place] at [facility] to provide your oncology and hematology care.  To afford each patient quality time with our provider, please arrive at least 15 minutes before your scheduled appointment time.   If you have a lab appointment with the [place] please come in thru the Main Entrance and check in at the main information desk  You need to re-schedule your appointment should you arrive 10 or more minutes late.  We strive to give you quality time with our providers, and arriving late affects you and other patients whose appointments are after yours.  Also, if you no show three or more times for appointments you may be dismissed from the clinic at the providers discretion.     Again, thank you for choosing [facility].  Our hope is that these requests will decrease the amount of time that you wait before being seen by our physicians.       _____________________________________________________________  Should you have questions after your visit to [facility], please contact our office at [phone] between the hours of 8:00 a.m. and 4:30 p.m.  Voicemails left after 4:00 p.m. will not be returned until the following business day.  For prescription refill requests, have your pharmacy contact our office and allow 72 hours.    Cancer Center Support Programs:   > Cancer Support Group  2nd Tuesday of the month 1pm-2pm, Journey Room

## 2021-03-08 NOTE — Progress Notes (Signed)
Patient presents today for treatment per orders.  Patient tolerated treatment well with no complaints voiced.  Patient left ambulatory in stable condition.  Vital signs stable at discharge.  Follow up as scheduled.     The chemotherapy medication bag should finish at 46 hours, 96 hours, or 7 days. For example, if your pump is scheduled for 46 hours and it was put on at 4:00 p.m., it should finish at 2:00 p.m. the day it is scheduled to come off regardless of your appointment time.     Estimated time to finish at 1230.   If the display on your pump reads "Low Volume" and it is beeping, take the batteries out of the pump and come to the cancer center for it to be taken off.   If the pump alarms go off prior to the pump reading "Low Volume" then call [phone] and someone can assist you.  If the plunger comes out and the chemotherapy medication is leaking out, please use your home chemo spill kit to clean up the spill. Do NOT use paper towels or other household products.  If you have problems or questions regarding your pump, please call either [phone] (24 hours a day) or the cancer center Monday-Friday 8:00 a.m.- 4:30 p.m. at the clinic number and we will assist you. If you are unable to get assistance, then go to the nearest Emergency Department and ask the staff to contact the IV team for assistance.

## 2021-03-10 ENCOUNTER — Other Ambulatory Visit: Payer: Self-pay

## 2021-03-10 ENCOUNTER — Inpatient Hospital Stay (HOSPITAL_COMMUNITY): Payer: Medicaid Other | Attending: Hematology

## 2021-03-10 VITALS — BP 99/64 | HR 88 | Temp 97.0°F | Resp 18

## 2021-03-10 DIAGNOSIS — Z5112 Encounter for antineoplastic immunotherapy: Secondary | ICD-10-CM | POA: Insufficient documentation

## 2021-03-10 DIAGNOSIS — C7801 Secondary malignant neoplasm of right lung: Secondary | ICD-10-CM | POA: Diagnosis not present

## 2021-03-10 DIAGNOSIS — C787 Secondary malignant neoplasm of liver and intrahepatic bile duct: Secondary | ICD-10-CM | POA: Insufficient documentation

## 2021-03-10 DIAGNOSIS — Z79899 Other long term (current) drug therapy: Secondary | ICD-10-CM | POA: Diagnosis not present

## 2021-03-10 DIAGNOSIS — C786 Secondary malignant neoplasm of retroperitoneum and peritoneum: Secondary | ICD-10-CM | POA: Diagnosis not present

## 2021-03-10 DIAGNOSIS — C189 Malignant neoplasm of colon, unspecified: Secondary | ICD-10-CM

## 2021-03-10 DIAGNOSIS — C187 Malignant neoplasm of sigmoid colon: Secondary | ICD-10-CM | POA: Diagnosis not present

## 2021-03-10 DIAGNOSIS — Z5111 Encounter for antineoplastic chemotherapy: Secondary | ICD-10-CM | POA: Insufficient documentation

## 2021-03-10 MED ORDER — HEPARIN SOD (PORK) LOCK FLUSH 100 UNIT/ML IV SOLN
500.0000 [IU] | Freq: Once | INTRAVENOUS | Status: AC | PRN
  Administered 2021-03-10: 500 [IU]

## 2021-03-10 MED ORDER — SODIUM CHLORIDE 0.9% FLUSH
10.0000 mL | INTRAVENOUS | Status: DC | PRN
  Administered 2021-03-10: 10 mL

## 2021-03-10 NOTE — Patient Instructions (Signed)
[facility]  Discharge Instructions: Thank you for choosing [facility] to provide your oncology and hematology care.  If you have a lab appointment with the Cancer Center, please come in thru the Main Entrance and check in at the main information desk.  Wear comfortable clothing and clothing appropriate for easy access to any Portacath or PICC line.   We strive to give you quality time with your provider. You may need to reschedule your appointment if you arrive late (15 or more minutes).  Arriving late affects you and other patients whose appointments are after yours.  Also, if you miss three or more appointments without notifying the office, you may be dismissed from the clinic at the provider's discretion.      For prescription refill requests, have your pharmacy contact our office and allow 72 hours for refills to be completed.        To help prevent nausea and vomiting after your treatment, we encourage you to take your nausea medication as directed.  BELOW ARE SYMPTOMS THAT SHOULD BE REPORTED IMMEDIATELY: *FEVER GREATER THAN 100.4 F (38 C) OR HIGHER *CHILLS OR SWEATING *NAUSEA AND VOMITING THAT IS NOT CONTROLLED WITH YOUR NAUSEA MEDICATION *UNUSUAL SHORTNESS OF BREATH *UNUSUAL BRUISING OR BLEEDING *URINARY PROBLEMS (pain or burning when urinating, or frequent urination) *BOWEL PROBLEMS (unusual diarrhea, constipation, pain near the anus) TENDERNESS IN MOUTH AND THROAT WITH OR WITHOUT PRESENCE OF ULCERS (sore throat, sores in mouth, or a toothache) UNUSUAL RASH, SWELLING OR PAIN  UNUSUAL VAGINAL DISCHARGE OR ITCHING   Items with * indicate a potential emergency and should be followed up as soon as possible or go to the Emergency Department if any problems should occur.  Please show the CHEMOTHERAPY ALERT CARD or IMMUNOTHERAPY ALERT CARD at check-in to the Emergency Department and triage nurse.  Should you have questions after your visit or need to cancel  or reschedule your appointment, please contact [facility] [phone]  and follow the prompts.  Office hours are 8:00 a.m. to 4:30 p.m. Monday - Friday. Please note that voicemails left after 4:00 p.m. may not be returned until the following business day.  We are closed weekends and major holidays. You have access to a nurse at all times for urgent questions. Please call the main number to the clinic [phone] and follow the prompts.  For any non-urgent questions, you may also contact your provider using MyChart. We now offer e-Visits for anyone 18 and older to request care online for non-urgent symptoms. For details visit [email or web address].   Also download the MyChart app! Go to the app store, search "MyChart", open the app, select [facility], and log in with your MyChart username and password.  Due to Covid, a mask is required upon entering the hospital/clinic. If you do not have a mask, one will be given to you upon arrival. For doctor visits, patients may have 1 support person aged 18 or older with them. For treatment visits, patients cannot have anyone with them due to current Covid guidelines and our immunocompromised population.  

## 2021-03-10 NOTE — Progress Notes (Signed)
[name] presents to have home infusion pump d/c'd and for port-a-cath deaccess with flush.  Portacath located R chest wall accessed with  H 20 needle.  Good blood return present. Portacath flushed with NS and 500U/84m Heparin, and needle removed intact.  Procedure tolerated well and without incident.   Patient left ambulatory in stable condition.  Vital signs stable at discharge.  Follow up as scheduled.

## 2021-03-14 ENCOUNTER — Encounter (HOSPITAL_COMMUNITY): Payer: Self-pay | Admitting: Hematology

## 2021-03-15 ENCOUNTER — Other Ambulatory Visit (HOSPITAL_COMMUNITY): Payer: Self-pay | Admitting: *Deleted

## 2021-03-16 ENCOUNTER — Telehealth (HOSPITAL_COMMUNITY): Payer: Self-pay | Admitting: *Deleted

## 2021-03-16 ENCOUNTER — Other Ambulatory Visit (HOSPITAL_COMMUNITY): Payer: Self-pay | Admitting: *Deleted

## 2021-03-16 NOTE — Telephone Encounter (Signed)
Received tc from Estill Bamberg, pharmacist at [facility] stating that Carafate with lidocaine was called in yesterday was brought back by patient, as the consistency was so thick it would barely come out of the bottle.  Consulted with Dr Delton Coombes who suggested lidocaine/Mylanta mix 1:1 and was called to pharmacy.  Spoke to Mr. Stranger and made him aware of the substitute and to contact us if there were any issues with this script.  Verbalized understanding.

## 2021-03-20 ENCOUNTER — Other Ambulatory Visit (HOSPITAL_COMMUNITY): Payer: Self-pay | Admitting: Hematology

## 2021-03-21 NOTE — Progress Notes (Signed)
 Henry Johns [address] [place], [place] [address]   CLINIC:  Medical Oncology/Hematology  PCP:  Henry Johns, Richard, Henry Johns [address] / [place] [address] [phone]   REASON FOR VISIT:  Follow-up for colon caner metastasized to liver  PRIOR THERAPY: none  NGS Results: not done  CURRENT THERAPY:  FOLFOX every 2 weeks  BRIEF ONCOLOGIC HISTORY:  Oncology History Overview Note  MMR normal Foundation One: low mutation burden   Metastasis to liver (HCC)  01/04/2021 Initial Diagnosis   Metastasis to liver (HCC)   01/25/2021 -  Chemotherapy    Patient is on Treatment Plan: COLORECTAL FOLFOX Q14D       Colon cancer metastasized to liver (HCC)  12/26/2020 Imaging   Ct abdomen and pelvis 1. Extensive hepatic metastatic disease and retroperitoneal adenopathy. 2. A 7 mm right lung base nodule, most consistent with metastatic disease. 3. No bowel obstruction. Normal appendix. 4. Aortic Atherosclerosis (ICD10-I70.0).   12/26/2020 Imaging   CT chest 1. Several small lung nodules consistent with metastatic disease. The primary malignancy remains unclear. The liver morphology suggests underlying cirrhosis, and multifocal hepatocellular carcinoma should be included in the differential diagnosis. 2. No acute abnormality in the chest.     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 LIVER, NEEDLE CORE BIOPSY:  -  Metastatic adenocarcinoma  -  See comment   COMMENT:   The neoplastic cells are positive for cytokeratin 20 and CDX2 but negative for cytokeratin 7, TTF-1, PSA and prostein.  The combined morphology and immunophenotype are consistent with metastasis from a  gastrointestinal primary.  Dr. Patrick reviewed the case and agrees with the above diagnosis.     01/13/2021 Procedure   Colonoscopy - Two 3 to 5 mm polyps in the transverse colon, removed with a cold snare. Resected and retrieved. -  Malignant partially obstructing tumor at 17 cm proximal to the anus. Biopsied. Tattooed. - The distal rectum and anal verge are normal on retroflexion view.   01/13/2021 Cancer Staging   Staging form: Colon and Rectum, AJCC 8th Edition - Clinical stage from 01/13/2021: Stage IVB (cTX, cN0, pM1b) - Signed by Gorsuch, Ni, Henry Johns on 01/13/2021 Stage prefix: Initial diagnosis Total positive nodes: 0   01/13/2021 Procedure   Colonoscopy - Two 3 to 5 mm polyps in the transverse colon, removed with a cold snare. Resected and retrieved. - Malignant partially obstructing tumor at 17 cm proximal to the anus. Biopsied. Tattooed. - The distal rectum and anal verge are normal on retroflexion view.   01/13/2021 Procedure   EGD - Normal esophagus. - Z-line regular, 40 cm from the incisors. - Normal stomach. - Nodular mucosa in   01/13/2021 Pathology Results   A. DUODENUM, NODULE, BIOPSY:  - Peptic duodenitis.  - No dysplasia or malignancy.   B. COLON, SIGMOID, MASS, BIOPSY:  - Invasive adenocarcinoma.   C. COLON, TRANSVERSE, POLYPECTOMY:  - Fragment of ulcerated tissue with adenocarcinoma, see comment.  - Tubular adenoma (X2 fragments).   COMMENT:   B. MMR will be ordered   01/13/2021 Tumor Marker   Patient's tumor was tested for the following markers: CEA. Results of the tumor marker test revealed 883.   01/24/2021 Procedure   Successful right-sided port placement, with the tip of the catheter in the proximal right atrium.   Plan: Catheter ready for use.   See detailed procedure note with images in PACS. The patient tolerated   the procedure well without incident or complication and was returned to Recovery in stable conditi   01/25/2021 -  Chemotherapy    Patient is on Treatment Plan: COLORECTAL FOLFOX Q14D       01/25/2021 Tumor Marker   Patient's tumor was tested for the following markers: CEA. Results of the tumor marker test revealed 1378.     CANCER STAGING: Cancer Staging Colon  cancer metastasized to liver (HCC) Staging form: Colon and Rectum, AJCC 8th Edition - Clinical stage from 01/13/2021: Stage IVB (cTX, cN0, pM1b) - Signed by Gorsuch, Ni, Henry Johns on 01/13/2021   INTERVAL HISTORY:  Henry Johns, a 60 y.o. male, returns for routine follow-up and consideration for next cycle of chemotherapy. Henry Johns was last seen on 03/08/2021.  Due for cycle #5 of FOLFOX today.   Overall, he tells me he has been feeling pretty well. He reports decreased concentration, anxiety, nausea, and fatigue. He has been taking Lorazepam once daily. The rash on his legs is stable. He reports occasional pain and itching rash on his tail bone. He reports occasional tingling in his fingertips. He reports nausea and denies vomiting and diarrhea. He reports cold sensitivity for 12 days following treatment, and a cold sensation in his feet.   Overall, he feels ready for next cycle of chemo today.   REVIEW OF SYSTEMS:  Review of Systems  Constitutional:  Positive for fatigue (75%). Negative for appetite change (85%).  Cardiovascular:  Positive for leg swelling (feet).  Gastrointestinal:  Positive for nausea. Negative for vomiting.  Musculoskeletal:  Positive for arthralgias (coccyx).  Skin:  Positive for itching and rash (legs).  Neurological:  Positive for dizziness and numbness (fingertips).  Psychiatric/Behavioral:  Positive for decreased concentration. The patient is nervous/anxious.   All other systems reviewed and are negative.  PAST MEDICAL/SURGICAL HISTORY:  Past Medical History:  Diagnosis Date   Diabetes mellitus without complication (HCC)    Hypertension    Past Surgical History:  Procedure Laterality Date   BIOPSY  01/13/2021   Procedure: BIOPSY;  Surgeon: Henry Johns, Daniel, Henry Johns;  Location: [facility];  Service: Gastroenterology;;   CATARACT EXTRACTION Right    COLONOSCOPY WITH PROPOFOL N/A 01/13/2021   Procedure: COLONOSCOPY WITH PROPOFOL;  Surgeon: Henry  Johns, Daniel, Henry Johns;  Location: [facility];  Service: Gastroenterology;  Laterality: N/A;  11:05   ESOPHAGOGASTRODUODENOSCOPY (EGD) WITH PROPOFOL N/A 01/13/2021   Procedure: ESOPHAGOGASTRODUODENOSCOPY (EGD) WITH PROPOFOL;  Surgeon: Henry Johns, Daniel, Henry Johns;  Location: [facility];  Service: Gastroenterology;  Laterality: N/A;   IR IMAGING GUIDED PORT INSERTION  01/24/2021   LIVER BIOPSY  01/10/2021   U/S guided   POLYPECTOMY  01/13/2021   Procedure: POLYPECTOMY;  Surgeon: Henry Johns, Daniel, Henry Johns;  Location: [facility];  Service: Gastroenterology;;    SOCIAL HISTORY:  Social History   Socioeconomic History   Marital status: Married    Spouse name: Not on file   Number of children: Not on file   Years of education: Not on file   Highest education level: Not on file  Occupational History   Not on file  Tobacco Use   Smoking status: Never   Smokeless tobacco: Never  Vaping Use   Vaping Use: Never used  Substance and Sexual Activity   Alcohol use: Not Currently   Drug use: Not Currently   Sexual activity: Not Currently  Other Topics Concern   Not on file  Social History Narrative   Married, no childer     Social Determinants of Health   Financial Resource Strain: Not on file  Food Insecurity: Not on file  Transportation Needs: No Transportation Needs   Lack of Transportation (Medical): No   Lack of Transportation (Non-Medical): No  Physical Activity: Inactive   Days of Exercise per Week: 0 days   Minutes of Exercise per Session: 0 min  Stress: Not on file  Social Connections: Not on file  Intimate Partner Violence: Not At Risk   Fear of Current or Ex-Partner: No   Emotionally Abused: No   Physically Abused: No   Sexually Abused: No    FAMILY HISTORY:  Family History  Problem Relation Age of Onset   Cancer Father 60       prostate ca    CURRENT MEDICATIONS:  Current Outpatient Medications  Medication Sig Dispense Refill   acetaminophen (TYLENOL)  500 MG tablet Take 1,000 mg by mouth every 6 (six) hours as needed for moderate pain.     atenolol (TENORMIN) 50 MG tablet Take 50 mg by mouth daily.     doxycycline (VIBRA-TABS) 100 MG tablet Take 1 tablet (100 mg total) by mouth 2 (two) times daily. 60 tablet 6   fluorouracil CALGB 80702 2,400 mg/m2 in sodium chloride 0.9 % 150 mL Inject 2,400 mg/m2 into the vein over 96 hr.     hydrocortisone 1 % ointment Apply 1 application topically 2 (two) times daily. 60 g 6   insulin NPH-regular Human (70-30) 100 UNIT/ML injection Inject 10-12 Units into the skin 2 (two) times daily.     LEUCOVORIN CALCIUM IV Inject 400 mg/m2 into the vein every 14 (fourteen) days.     lidocaine-prilocaine (EMLA) cream Apply to affected area once 30 g 3   LORazepam (ATIVAN) 0.5 MG tablet Take 1 tablet by mouth twice daily as needed for anxiety 60 tablet 3   morphine (MSIR) 15 MG tablet Take 1 tablet (15 mg total) by mouth every 6 (six) hours as needed for severe pain. 60 tablet 0   Multiple Vitamin (MULTIVITAMIN WITH MINERALS) TABS tablet Take 1 tablet by mouth daily.     omeprazole (PRILOSEC) 40 MG capsule Take 1 capsule (40 mg total) by mouth daily. 90 capsule 3   ondansetron (ZOFRAN) 8 MG tablet Take 1 tablet (8 mg total) by mouth every 8 (eight) hours as needed for nausea. 60 tablet 3   OXALIPLATIN IV Inject 85 mg/m2 into the vein every 14 (fourteen) days.     polyethylene glycol (MIRALAX) 17 g packet Take 17 g by mouth daily. 30 each 3   simvastatin (ZOCOR) 20 MG tablet SMARTSIG:1 Tablet(s) By Mouth Every Evening     temazepam (RESTORIL) 15 MG capsule TAKE 1 CAPSULE BY MOUTH AT BEDTIME AS NEEDED FOR SLEEP 30 capsule 0   No current facility-administered medications for this visit.    ALLERGIES:  No Known Allergies  PHYSICAL EXAM:  Performance status (ECOG): 1 - Symptomatic but completely ambulatory  There were no vitals filed for this visit. Wt Readings from Last 3 Encounters:  03/08/21 166 lb 9.6 oz (75.6  kg)  02/27/21 164 lb 11.2 oz (74.7 kg)  02/22/21 170 lb (77.1 kg)   Physical Exam Vitals reviewed.  Constitutional:      Appearance: Normal appearance.  Cardiovascular:     Rate and Rhythm: Normal rate and regular rhythm.     Pulses: Normal pulses.     Heart sounds: Normal heart sounds.  Pulmonary:     Effort: Pulmonary effort is normal.       Breath sounds: Normal breath sounds.  Neurological:     General: No focal deficit present.     Mental Status: He is alert and oriented to person, place, and time.  Psychiatric:        Mood and Affect: Mood normal.        Behavior: Behavior normal.    LABORATORY DATA:  I have reviewed the labs as listed.  CBC Latest Ref Rng & Units 03/08/2021 02/22/2021 02/08/2021  WBC 4.0 - 10.5 K/uL 5.5 7.6 8.4  Hemoglobin 13.0 - 17.0 g/dL 11.8(L) 10.9(L) 10.7(L)  Hematocrit 39.0 - 52.0 % 34.8(L) 32.6(L) 32.9(L)  Platelets 150 - 400 K/uL 121(L) 176 238   CMP Latest Ref Rng & Units 03/08/2021 02/22/2021 02/08/2021  Glucose 70 - 99 mg/dL 139(H) 124(H) 199(H)  BUN 6 - 20 mg/dL _0 Creatinine 0.61 - 1.24 mg/dL 0.68 0.64 0.73  Sodium 135 - 145 mmol/L 134(L) 135 135  Potassium 3.5 - 5.1 mmol/L 3.7 3.7 4.1  Chloride 98 - 111 mmol/L 102 101 99  CO2 22 - 32 mmol/L _1 Calcium 8.9 - 10.3 mg/dL 8.5(L) 8.2(L) 8.8(L)  Total Protein 6.5 - 8.1 g/dL 6.3(L) 6.4(L) 6.9  Total Bilirubin 0.3 - 1.2 mg/dL 1.1 1.5(H) 1.8(H)  Alkaline Phos 38 - 126 U/L 465(H) 499(H) 867(H)  AST 15 - 41 U/L 54(H) 47(H) 63(H)  ALT 0 - 44 U/L 31 26 35    DIAGNOSTIC IMAGING:  I have independently reviewed the scans and discussed with the patient. No results found.   ASSESSMENT:  1.  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 - CT [facility] with contrast on 12/26/2020 showed extensive hepatic metastatic disease and retroperitoneal adenopathy.  CT chest without contrast on 12/26/2020 showed several small  subcentimeter lung nodules consistent with metastatic disease.  Liver morphology suggests underlying cirrhosis. - Liver biopsy on 01/10/2021 consistent with metastatic adenocarcinoma, CK20 and CDX2 positive, negative for CK7, TTF-1, PSA and prostein. - Colonoscopy and biopsy of the sigmoid mass on 01/13/2021 consistent with adenocarcinoma. - 10 pound weight loss prior to start of therapy. - FOLFOX started on 01/25/2021. - Foundation 1 testing on 01/10/2021 showed MSI-stable, TMB low, negative for RAS/BRAF/HER2 mutation.  2.  Social/family history: - Lives at home with his wife.  He drove truck and operated equipment.  No smoking history. - Father had prostate cancer   PLAN:  1.  Metastatic sigmoid colon cancer to liver and lungs: - He reported more fatigue after last treatment.  He had nausea but denied any vomiting.  No diarrhea reported.  He also reported feet feeling cold and cold sensitivity lasting about 12 days. - Reviewed labs today which showed AST 46, rest of the LFTs are within normal limits.  CBC was grossly normal.  Last CEA was 1176.  CEA from today is pending. - Because of worsening fatigue, recommend dose reduction of Vectibix by 20%.  We will also dose reduce oxaliplatin by 20% because of early neuropathy. - RTC 2 weeks for follow-up with labs and treatment.  Plan to repeat scans after cycle 6.  2.  Coccygeal pain: - Continue half tablet of MS IR 15 mg at bedtime.  3.  Diabetes: - Continue NPH 70/30, 8 units if blood sugar more than 200 and 5 units if its more than 150.  4.  Difficulty falling/staying asleep: - Continue Restoril 15 mg at bedtime.  5.  Normocytic anemia: -  Last ferritin was 137.  Hemoglobin today is improved to 12.2.  No indication for parenteral iron therapy.  6.  Erythematous maculopapular rash: - He has rash above his ankles which was present prior to chemotherapy but has slightly worsened since last 2 cycles. - We have sent a referral to [facility]  [facility] in [place].  7.  EGFR antibody induced rash: - He has mild rash grade 1 on the sacrum. - Continue doxycycline 100 mg twice daily.  Continue hydrocortisone cream twice daily.  8.  Anxiety: - He is taking Ativan 0.5 mg twice daily.  He still has some anxiety. - Recommend increasing Ativan to every 8 hours as needed.   Orders placed this encounter:  No orders of the defined types were placed in this encounter.    Derek Jack, Henry Johns [place] [phone]   I, Brendolyn Patty, am acting as a scribe for Dr. Derek Jack.  I, Derek Jack Henry Johns, have reviewed the above documentation for accuracy and completeness, and I agree with the above.

## 2021-03-22 ENCOUNTER — Other Ambulatory Visit: Payer: Self-pay

## 2021-03-22 ENCOUNTER — Inpatient Hospital Stay (HOSPITAL_COMMUNITY): Payer: Medicaid Other

## 2021-03-22 ENCOUNTER — Inpatient Hospital Stay (HOSPITAL_BASED_OUTPATIENT_CLINIC_OR_DEPARTMENT_OTHER): Payer: Medicaid Other | Admitting: Hematology

## 2021-03-22 VITALS — BP 113/76 | HR 115 | Temp 98.0°F | Resp 18 | Wt 164.9 lb

## 2021-03-22 VITALS — BP 125/78 | HR 89 | Temp 96.7°F | Resp 18

## 2021-03-22 DIAGNOSIS — C189 Malignant neoplasm of colon, unspecified: Secondary | ICD-10-CM

## 2021-03-22 DIAGNOSIS — C787 Secondary malignant neoplasm of liver and intrahepatic bile duct: Secondary | ICD-10-CM

## 2021-03-22 DIAGNOSIS — Z5112 Encounter for antineoplastic immunotherapy: Secondary | ICD-10-CM | POA: Diagnosis not present

## 2021-03-22 LAB — COMPREHENSIVE METABOLIC PANEL
ALT: 26 U/L (ref 0–44)
AST: 46 U/L — ABNORMAL HIGH (ref 15–41)
Albumin: 3.1 g/dL — ABNORMAL LOW (ref 3.5–5.0)
Alkaline Phosphatase: 342 U/L — ABNORMAL HIGH (ref 38–126)
Anion gap: 8 (ref 5–15)
BUN: 10 mg/dL (ref 6–20)
CO2: 26 mmol/L (ref 22–32)
Calcium: 8.6 mg/dL — ABNORMAL LOW (ref 8.9–10.3)
Chloride: 101 mmol/L (ref 98–111)
Creatinine, Ser: 0.57 mg/dL — ABNORMAL LOW (ref 0.61–1.24)
GFR, Estimated: >60 mL/min (ref >60)
Glucose, Bld: 135 mg/dL — ABNORMAL HIGH (ref 70–99)
Potassium: 3.8 mmol/L (ref 3.5–5.1)
Sodium: 135 mmol/L (ref 135–145)
Total Bilirubin: 0.8 mg/dL (ref 0.3–1.2)
Total Protein: 6.4 g/dL — ABNORMAL LOW (ref 6.5–8.1)

## 2021-03-22 LAB — CBC WITH DIFFERENTIAL/PLATELET
Abs Immature Granulocytes: 0.01 10*3/uL (ref 0.00–0.07)
Basophils Absolute: 0 10*3/uL (ref 0.0–0.1)
Basophils Relative: 1 %
Eosinophils Absolute: 0.1 10*3/uL (ref 0.0–0.5)
Eosinophils Relative: 3 %
HCT: 36.2 % — ABNORMAL LOW (ref 39.0–52.0)
Hemoglobin: 12.2 g/dL — ABNORMAL LOW (ref 13.0–17.0)
Immature Granulocytes: 0 %
Lymphocytes Relative: 33 %
Lymphs Abs: 1.9 10*3/uL (ref 0.7–4.0)
MCH: 30.9 pg (ref 26.0–34.0)
MCHC: 33.7 g/dL (ref 30.0–36.0)
MCV: 91.6 fL (ref 80.0–100.0)
Monocytes Absolute: 0.7 10*3/uL (ref 0.1–1.0)
Monocytes Relative: 12 %
Neutro Abs: 2.9 10*3/uL (ref 1.7–7.7)
Neutrophils Relative %: 51 %
Platelets: 138 10*3/uL — ABNORMAL LOW (ref 150–400)
RBC: 3.95 MIL/uL — ABNORMAL LOW (ref 4.22–5.81)
RDW: 17.2 % — ABNORMAL HIGH (ref 11.5–15.5)
WBC: 5.7 10*3/uL (ref 4.0–10.5)
nRBC: 0 % (ref 0.0–0.2)

## 2021-03-22 LAB — MAGNESIUM: Magnesium: 1.8 mg/dL (ref 1.7–2.4)

## 2021-03-22 MED ORDER — DEXTROSE 5 % IV SOLN: Freq: Once | INTRAVENOUS | Status: AC

## 2021-03-22 MED ORDER — SODIUM CHLORIDE 0.9 % IV SOLN
4.8000 mg/kg | Freq: Once | INTRAVENOUS | Status: AC
  Administered 2021-03-22: 360 mg via INTRAVENOUS
  Filled 2021-03-22: qty 18

## 2021-03-22 MED ORDER — LEUCOVORIN CALCIUM INJECTION 350 MG
400.0000 mg/m2 | Freq: Once | INTRAVENOUS | Status: AC
  Administered 2021-03-22: 784 mg via INTRAVENOUS
  Filled 2021-03-22: qty 39.2

## 2021-03-22 MED ORDER — OXALIPLATIN CHEMO INJECTION 100 MG/20ML
68.0000 mg/m2 | Freq: Once | INTRAVENOUS | Status: AC
  Administered 2021-03-22: 135 mg via INTRAVENOUS
  Filled 2021-03-22: qty 10

## 2021-03-22 MED ORDER — LEUCOVORIN CALCIUM INJECTION 350 MG: 400.0000 mg/m2 | Freq: Once | INTRAMUSCULAR | Status: DC

## 2021-03-22 MED ORDER — SODIUM CHLORIDE 0.9 % IV SOLN
10.0000 mg | Freq: Once | INTRAVENOUS | Status: AC
  Administered 2021-03-22: 10 mg via INTRAVENOUS
  Filled 2021-03-22: qty 10

## 2021-03-22 MED ORDER — PALONOSETRON HCL INJECTION 0.25 MG/5ML
0.2500 mg | Freq: Once | INTRAVENOUS | Status: AC
  Administered 2021-03-22: 0.25 mg via INTRAVENOUS
  Filled 2021-03-22: qty 5

## 2021-03-22 MED ORDER — SODIUM CHLORIDE 0.9 % IV SOLN
5000.0000 mg | INTRAVENOUS | Status: DC
  Administered 2021-03-22: 5000 mg via INTRAVENOUS
  Filled 2021-03-22: qty 100

## 2021-03-22 MED ORDER — SODIUM CHLORIDE 0.9 % IV SOLN: Freq: Once | INTRAVENOUS | Status: AC

## 2021-03-22 NOTE — Progress Notes (Signed)
Patient presents today for treatment and follow up visit with Dr. Delton Coombes. Labs within parameters for treatment. Heart rate on arrival 115. Albumin 3.1 today.   Message received from [facility] RN/Dr. Delton Coombes to proceed with treatment. Labs and vital signs reviewed by MD. Dr. Vickey Huger to decrease Oxaliplatin and Vectibix doses due to patient not feeling well and cold sensitivity per message.   Patient has complaints of cold sensitivity in his hands and feet. Patient voices no other issues related to the side effects of his chemo therapy.   Treatment given today per MD orders. Tolerated infusion without adverse affects. Vital signs stable. No complaints at this time. Discharged from clinic ambulatory in stable condition. Alert and oriented x 3. F/U with [facility] as scheduled.

## 2021-03-22 NOTE — Progress Notes (Signed)
Patient has been assessed, vital signs and labs have been reviewed by Dr. Katragadda. ANC, Creatinine, LFTs, and Platelets are within treatment parameters per Dr. Katragadda. The patient is good to proceed with treatment at this time. Primary RN and pharmacy aware.  

## 2021-03-22 NOTE — Progress Notes (Signed)
Patients port flushed without difficulty.  Good blood return noted with no bruising or swelling noted at site. Biopatch in place.  Patient remains accessed for chemotherapy treatment.

## 2021-03-22 NOTE — Patient Instructions (Addendum)
[facility]  Discharge Instructions: Thank you for choosing [facility] to provide your oncology and hematology care.  If you have a lab appointment with the [facility], please come in thru the Main Entrance and check in at the main information desk.  Wear comfortable clothing and clothing appropriate for easy access to any Portacath or PICC line.   We strive to give you quality time with your provider. You may need to The chemotherapy medication bag should finish at 46 hours, 96 hours, or 7 days. For example, if your pump is scheduled for 46 hours and it was put on at 4:00 p.m., it should finish at 2:00 p.m. the day it is scheduled to come off regardless of your appointment time.     Estimated time to finish at 1330.   If the display on your pump reads "Low Volume" and it is beeping, take the batteries out of the pump and come to the cancer center for it to be taken off.   If the pump alarms go off prior to the pump reading "Low Volume" then call [phone] and someone can assist you.  If the plunger comes out and the chemotherapy medication is leaking out, please use your home chemo spill kit to clean up the spill. Do NOT use paper towels or other household products.  If you have problems or questions regarding your pump, please call either [phone] (24 hours a day) or the cancer center Monday-Friday 8:00 a.m.- 4:30 p.m. at the clinic number and we will assist you. If you are unable to get assistance, then go to the nearest Emergency Department and ask the staff to contact the IV team for assistance.  reschedule your appointment if you arrive late (15 or more minutes).  Arriving late affects you and other patients whose appointments are after yours.  Also, if you miss three or more appointments without notifying the office, you may be dismissed from the clinic at the provider's discretion.      For prescription refill requests, have your pharmacy contact  our office and allow 72 hours for refills to be completed.    Today you received the following chemotherapy and/or immunotherapy agents Vectibix/ Folfox/ Adrucil 17f.       To help prevent nausea and vomiting after your treatment, we encourage you to take your nausea medication as directed.  BELOW ARE SYMPTOMS THAT SHOULD BE REPORTED IMMEDIATELY: *FEVER GREATER THAN 100.4 F (38 C) OR HIGHER *CHILLS OR SWEATING *NAUSEA AND VOMITING THAT IS NOT CONTROLLED WITH YOUR NAUSEA MEDICATION *UNUSUAL SHORTNESS OF BREATH *UNUSUAL BRUISING OR BLEEDING *URINARY PROBLEMS (pain or burning when urinating, or frequent urination) *BOWEL PROBLEMS (unusual diarrhea, constipation, pain near the anus) TENDERNESS IN MOUTH AND THROAT WITH OR WITHOUT PRESENCE OF ULCERS (sore throat, sores in mouth, or a toothache) UNUSUAL RASH, SWELLING OR PAIN  UNUSUAL VAGINAL DISCHARGE OR ITCHING   Items with * indicate a potential emergency and should be followed up as soon as possible or go to the Emergency Department if any problems should occur.  Please show the CHEMOTHERAPY ALERT CARD or IMMUNOTHERAPY ALERT CARD at check-in to the Emergency Department and triage nurse.  Should you have questions after your visit or need to cancel or reschedule your appointment, please contact [phone] and follow the prompts.  Office hours are 8:00 a.m. to 4:30 p.m. Monday - Friday. Please note that voicemails left after 4:00 p.m. may not be returned until the  following business day.  We are closed weekends and major holidays. You have access to a nurse at all times for urgent questions. Please call the main number to the clinic [phone] and follow the prompts.  For any non-urgent questions, you may also contact your provider using MyChart. We now offer e-Visits for anyone 65 and older to request care online for non-urgent symptoms. For details visit [email or web address].   Also download the MyChart app! Go  to the app store, search "MyChart", open the app, select , and log in with your MyChart username and password.  Due to Covid, a mask is required upon entering the hospital/clinic. If you do not have a mask, one will be given to you upon arrival. For doctor visits, patients may have 1 support person aged 40 or older with them. For treatment visits, patients cannot have anyone with them due to current Covid guidelines and our immunocompromised population.

## 2021-03-22 NOTE — Patient Instructions (Addendum)
[facility] at [facility] Discharge Instructions  You were seen today by Dr. Katragadda. He went over your recent results, and you received your treatment. Dr. Katragadda will see you back in 2 weeks for labs and follow up.   Thank you for choosing [facility] at [facility] to provide your oncology and hematology care.  To afford each patient quality time with our provider, please arrive at least 15 minutes before your scheduled appointment time.   If you have a lab appointment with the Cancer Center please come in thru the Main Entrance and check in at the main information desk  You need to re-schedule your appointment should you arrive 10 or more minutes late.  We strive to give you quality time with our providers, and arriving late affects you and other patients whose appointments are after yours.  Also, if you no show three or more times for appointments you may be dismissed from the clinic at the providers discretion.     Again, thank you for choosing [facility].  Our hope is that these requests will decrease the amount of time that you wait before being seen by our physicians.       _____________________________________________________________  Should you have questions after your visit to [facility], please contact our office at [phone] between the hours of 8:00 a.m. and 4:30 p.m.  Voicemails left after 4:00 p.m. will not be returned until the following business day.  For prescription refill requests, have your pharmacy contact our office and allow 72 hours.    Cancer Center Support Programs:   > Cancer Support Group  2nd Tuesday of the month 1pm-2pm, Journey Room   

## 2021-03-23 LAB — CEA: CEA: 278 ng/mL — ABNORMAL HIGH (ref 0.0–4.7)

## 2021-03-24 ENCOUNTER — Inpatient Hospital Stay (HOSPITAL_COMMUNITY): Payer: Medicaid Other

## 2021-03-24 VITALS — BP 102/67 | HR 100 | Temp 96.7°F | Resp 18

## 2021-03-24 DIAGNOSIS — Z5112 Encounter for antineoplastic immunotherapy: Secondary | ICD-10-CM | POA: Diagnosis not present

## 2021-03-24 DIAGNOSIS — C787 Secondary malignant neoplasm of liver and intrahepatic bile duct: Secondary | ICD-10-CM

## 2021-03-24 DIAGNOSIS — C189 Malignant neoplasm of colon, unspecified: Secondary | ICD-10-CM

## 2021-03-24 MED ORDER — HEPARIN SOD (PORK) LOCK FLUSH 100 UNIT/ML IV SOLN
500.0000 [IU] | Freq: Once | INTRAVENOUS | Status: AC | PRN
  Administered 2021-03-24: 500 [IU]

## 2021-03-24 MED ORDER — SODIUM CHLORIDE 0.9% FLUSH
10.0000 mL | INTRAVENOUS | Status: DC | PRN
  Administered 2021-03-24: 10 mL

## 2021-03-24 NOTE — Patient Instructions (Signed)
[facility]  Discharge Instructions: Thank you for choosing [facility] to provide your oncology and hematology care.  If you have a lab appointment with the Cancer Center, please come in thru the Main Entrance and check in at the main information desk.  Wear comfortable clothing and clothing appropriate for easy access to any Portacath or PICC line.   We strive to give you quality time with your provider. You may need to reschedule your appointment if you arrive late (15 or more minutes).  Arriving late affects you and other patients whose appointments are after yours.  Also, if you miss three or more appointments without notifying the office, you may be dismissed from the clinic at the provider's discretion.      For prescription refill requests, have your pharmacy contact our office and allow 72 hours for refills to be completed.        To help prevent nausea and vomiting after your treatment, we encourage you to take your nausea medication as directed.  BELOW ARE SYMPTOMS THAT SHOULD BE REPORTED IMMEDIATELY: *FEVER GREATER THAN 100.4 F (38 C) OR HIGHER *CHILLS OR SWEATING *NAUSEA AND VOMITING THAT IS NOT CONTROLLED WITH YOUR NAUSEA MEDICATION *UNUSUAL SHORTNESS OF BREATH *UNUSUAL BRUISING OR BLEEDING *URINARY PROBLEMS (pain or burning when urinating, or frequent urination) *BOWEL PROBLEMS (unusual diarrhea, constipation, pain near the anus) TENDERNESS IN MOUTH AND THROAT WITH OR WITHOUT PRESENCE OF ULCERS (sore throat, sores in mouth, or a toothache) UNUSUAL RASH, SWELLING OR PAIN  UNUSUAL VAGINAL DISCHARGE OR ITCHING   Items with * indicate a potential emergency and should be followed up as soon as possible or go to the Emergency Department if any problems should occur.  Please show the CHEMOTHERAPY ALERT CARD or IMMUNOTHERAPY ALERT CARD at check-in to the Emergency Department and triage nurse.  Should you have questions after your visit or need to cancel  or reschedule your appointment, please contact [facility] [phone]  and follow the prompts.  Office hours are 8:00 a.m. to 4:30 p.m. Monday - Friday. Please note that voicemails left after 4:00 p.m. may not be returned until the following business day.  We are closed weekends and major holidays. You have access to a nurse at all times for urgent questions. Please call the main number to the clinic [phone] and follow the prompts.  For any non-urgent questions, you may also contact your provider using MyChart. We now offer e-Visits for anyone 18 and older to request care online for non-urgent symptoms. For details visit [email or web address].   Also download the MyChart app! Go to the app store, search "MyChart", open the app, select [facility], and log in with your MyChart username and password.  Due to Covid, a mask is required upon entering the hospital/clinic. If you do not have a mask, one will be given to you upon arrival. For doctor visits, patients may have 1 support person aged 18 or older with them. For treatment visits, patients cannot have anyone with them due to current Covid guidelines and our immunocompromised population.  

## 2021-03-24 NOTE — Progress Notes (Signed)
Patient presents today for infusystem pump d/c.  Patient is in satisfactory condition with no new complaints voiced.  Vital signs are stable.  Port flushed without difficulty.  Good blood return noted.  Band-aid applied.  Patient left ambulatory in stable condition.

## 2021-03-30 ENCOUNTER — Telehealth (HOSPITAL_COMMUNITY): Payer: Self-pay | Admitting: *Deleted

## 2021-03-30 NOTE — Telephone Encounter (Signed)
Patient called stating that his bowel movements have become hard and difficult to pass.  Currently using mira lax with ineffective results. Advised to take 2 colace in the am once daily and increase to 2 tabs in the morning and 2 in the afternoon.  Will call back if this is not working for him.

## 2021-04-05 ENCOUNTER — Other Ambulatory Visit (HOSPITAL_COMMUNITY): Payer: Self-pay

## 2021-04-05 ENCOUNTER — Other Ambulatory Visit (HOSPITAL_COMMUNITY): Payer: Medicaid Other

## 2021-04-05 ENCOUNTER — Ambulatory Visit (HOSPITAL_COMMUNITY): Payer: Medicaid Other | Admitting: Hematology

## 2021-04-05 ENCOUNTER — Ambulatory Visit (HOSPITAL_COMMUNITY): Payer: Medicaid Other

## 2021-04-05 MED ORDER — HYDROCORTISONE ACETATE 25 MG RE SUPP: 25.0000 mg | Freq: Two times a day (BID) | RECTAL | 0 refills | Status: DC

## 2021-04-05 MED ORDER — PROCTOFOAM HC 1-1 % EX FOAM
1.0000 | Freq: Four times a day (QID) | CUTANEOUS | 0 refills | Status: DC | PRN
Start: 2021-04-05 — End: 2021-04-24

## 2021-04-05 NOTE — Telephone Encounter (Signed)
Patient called reporting burning with BM. Reports that he believes he has hemorrhoids. Using OTC hemorrhoid cream without relief. Dr. Delton Coombes made aware. Orders placed per Dr. Delton Coombes and patient made aware.

## 2021-04-06 ENCOUNTER — Other Ambulatory Visit (HOSPITAL_COMMUNITY): Payer: Self-pay

## 2021-04-06 MED ORDER — MORPHINE SULFATE 15 MG PO TABS: 15.0000 mg | ORAL_TABLET | Freq: Four times a day (QID) | ORAL | 0 refills | Status: DC | PRN

## 2021-04-07 ENCOUNTER — Encounter (HOSPITAL_COMMUNITY): Payer: Medicaid Other

## 2021-04-07 ENCOUNTER — Other Ambulatory Visit (HOSPITAL_COMMUNITY): Payer: Self-pay

## 2021-04-08 NOTE — Progress Notes (Signed)
[facility] [address], [place] [address]   CLINIC:  Medical Oncology/Hematology  PCP:  Lucia Gaskins, MD [facility] / [facility] [address] [phone]   REASON FOR VISIT:  Follow-up for colon caner metastasized to liver  PRIOR THERAPY: none  NGS Results: not done  CURRENT THERAPY: FOLFOX every 2 weeks  BRIEF ONCOLOGIC HISTORY:  Oncology History Overview Note  MMR normal Foundation One: low mutation burden   Metastasis to liver ([place])  01/04/2021 Initial Diagnosis   Metastasis to liver ([place])   01/25/2021 -  Chemotherapy   Patient is on Treatment Plan : COLORECTAL FOLFOX q14d     Colon cancer metastasized to liver ([place])  12/26/2020 Imaging   Ct abdomen and pelvis 1. Extensive hepatic metastatic disease and retroperitoneal adenopathy. 2. A 7 mm right lung base nodule, most consistent with metastatic disease. 3. No bowel obstruction. Normal appendix. 4. Aortic Atherosclerosis (ICD10-I70.0).   12/26/2020 Imaging   CT chest 1. Several small lung nodules consistent with metastatic disease. The primary malignancy remains unclear. The liver morphology suggests underlying cirrhosis, and multifocal hepatocellular carcinoma should be included in the differential diagnosis. 2. No acute abnormality in the chest.     01/04/2021 Initial Diagnosis   Carcinoma metastatic to lung [facility])   01/10/2021 Pathology Results   A. LIVER, NEEDLE CORE BIOPSY:  -  Metastatic adenocarcinoma  -  See comment   COMMENT:   The neoplastic cells are positive for cytokeratin 20 and CDX2 but negative for cytokeratin 7, TTF-1, PSA and prostein.  The combined morphology and immunophenotype are consistent with metastasis from a  gastrointestinal primary.  Dr. Saralyn Pilar reviewed the case and agrees with the above diagnosis.     01/13/2021 Procedure   Colonoscopy - Two 3 to 5 mm polyps in the transverse colon, removed with a cold snare. Resected and retrieved. - Malignant  partially obstructing tumor at 17 cm proximal to the anus. Biopsied. Tattooed. - The distal rectum and anal verge are normal on retroflexion view.   01/13/2021 Cancer Staging   Staging form: Colon and Rectum, AJCC 8th Edition - Clinical stage from 01/13/2021: Stage IVB (cTX, cN0, pM1b) - Signed by Heath Lark, MD on 01/13/2021 Stage prefix: Initial diagnosis Total positive nodes: 0   01/13/2021 Procedure   Colonoscopy - Two 3 to 5 mm polyps in the transverse colon, removed with a cold snare. Resected and retrieved. - Malignant partially obstructing tumor at 17 cm proximal to the anus. Biopsied. Tattooed. - The distal rectum and anal verge are normal on retroflexion view.   01/13/2021 Procedure   EGD - Normal esophagus. - Z-line regular, 40 cm from the incisors. - Normal stomach. - Nodular mucosa in   01/13/2021 Pathology Results   A. DUODENUM, NODULE, BIOPSY:  - Peptic duodenitis.  - No dysplasia or malignancy.   B. COLON, SIGMOID, MASS, BIOPSY:  - Invasive adenocarcinoma.   C. COLON, TRANSVERSE, POLYPECTOMY:  - Fragment of ulcerated tissue with adenocarcinoma, see comment.  - Tubular adenoma (X2 fragments).   COMMENT:   B. MMR will be ordered   01/13/2021 Tumor Marker   Patient's tumor was tested for the following markers: CEA. Results of the tumor marker test revealed 883.   01/24/2021 Procedure   Successful right-sided port placement, with the tip of the catheter in the proximal right atrium.   Plan: Catheter ready for use.   See detailed procedure note with images in PACS. The patient tolerated the procedure well  without incident or complication and was returned to Recovery in stable conditi   01/25/2021 -  Chemotherapy   Patient is on Treatment Plan : COLORECTAL FOLFOX q14d     01/25/2021 Tumor Marker   Patient's tumor was tested for the following markers: CEA. Results of the tumor marker test revealed 1378.     CANCER STAGING: Cancer Staging Colon cancer metastasized  to liver [facility]) Staging form: Colon and Rectum, AJCC 8th Edition - Clinical stage from 01/13/2021: Stage IVB (cTX, cN0, pM1b) - Signed by Heath Lark, MD on 01/13/2021   INTERVAL HISTORY:  [name], a 60 y.o. male, returns for routine follow-up and consideration for next cycle of chemotherapy. Judea was last seen on 03/22/2021.  Due for cycle #6 of FOLFOX today.   Overall, he tells me he has been feeling pretty well. He is taking 1/2 tablet of morphine at bedtime. His rash on his legs as well as his anxiety are improving. He reports good appetite. He denies diarrhea. He reports rash on his rectum.   Overall, he feels ready for next cycle of chemo today.   REVIEW OF SYSTEMS:  Review of Systems  Constitutional:  Negative for appetite change and fatigue (60%).  Gastrointestinal:  Positive for constipation and nausea. Negative for diarrhea.  Skin:  Positive for rash (improving).  Neurological:  Positive for dizziness and numbness (fingertips).  Psychiatric/Behavioral:  The patient is nervous/anxious (improving).   All other systems reviewed and are negative.  PAST MEDICAL/SURGICAL HISTORY:  Past Medical History:  Diagnosis Date   Diabetes mellitus without complication ([place])    Hypertension    Past Surgical History:  Procedure Laterality Date   BIOPSY  01/13/2021   Procedure: BIOPSY;  Surgeon: Harvel Quale, MD;  [facility];  Service: Gastroenterology;;   CATARACT EXTRACTION Right    COLONOSCOPY WITH PROPOFOL N/A 01/13/2021   Procedure: COLONOSCOPY WITH PROPOFOL;  Surgeon: Harvel Quale, MD;  Location: [facility];  Service: Gastroenterology;  Laterality: N/A;  11:05   ESOPHAGOGASTRODUODENOSCOPY (EGD) WITH PROPOFOL N/A 01/13/2021   Procedure: ESOPHAGOGASTRODUODENOSCOPY (EGD) WITH PROPOFOL;  Surgeon: Harvel Quale, MD;  Location: [facility];  Service: Gastroenterology;  Laterality: N/A;   IR IMAGING GUIDED PORT INSERTION   01/24/2021   LIVER BIOPSY  01/10/2021   U/S guided   POLYPECTOMY  01/13/2021   Procedure: POLYPECTOMY;  Surgeon: Harvel Quale, MD;  [facility]: [facility];  Service: Gastroenterology;;    SOCIAL HISTORY:  Social History   Socioeconomic History   Marital status: Married    Spouse name: Not on file   Number of children: Not on file   Years of education: Not on file   Highest education level: Not on file  Occupational History   Not on file  Tobacco Use   Smoking status: Never   Smokeless tobacco: Never  Vaping Use   Vaping Use: Never used  Substance and Sexual Activity   Alcohol use: Not Currently   Drug use: Not Currently   Sexual activity: Not Currently  Other Topics Concern   Not on file  Social History Narrative   Married, no childer   Social Determinants of Health   Financial Resource Strain: Not on file  Food Insecurity: Not on file  Transportation Needs: No Transportation Needs   Lack of Transportation (Medical): No   Lack of Transportation (Non-Medical): No  Physical Activity: Inactive   Days of Exercise per Week: 0 days   Minutes of  Exercise per Session: 0 min  Stress: Not on file  Social Connections: Not on file  Intimate Partner Violence: Not At Risk   Fear of Current or Ex-Partner: No   Emotionally Abused: No   Physically Abused: No   Sexually Abused: No    FAMILY HISTORY:  Family History  Problem Relation Age of Onset   Cancer Father 44       prostate ca    CURRENT MEDICATIONS:  Current Outpatient Medications  Medication Sig Dispense Refill   acetaminophen (TYLENOL) 500 MG tablet Take 1,000 mg by mouth every 6 (six) hours as needed for moderate pain.     atenolol (TENORMIN) 50 MG tablet Take 50 mg by mouth daily.     doxycycline (VIBRA-TABS) 100 MG tablet Take 1 tablet (100 mg total) by mouth 2 (two) times daily. 60 tablet 6   fluorouracil CALGB 46962 2,400 mg/m2 in sodium chloride 0.9 % 150 mL Inject 2,400 mg/m2 into the vein over  96 hr.     hydrocortisone (ANUSOL-HC) 25 MG suppository Place 1 suppository (25 mg total) rectally 2 (two) times daily. 30 suppository 0   hydrocortisone 1 % ointment Apply 1 application topically 2 (two) times daily. 60 g 6   hydrocortisone-pramoxine (PROCTOFOAM HC) rectal foam Place 1 applicator rectally every 6 (six) hours as needed for hemorrhoids or anal itching. 15 g 0   insulin NPH-regular Human (70-30) 100 UNIT/ML injection Inject 10-12 Units into the skin 2 (two) times daily.     LEUCOVORIN CALCIUM IV Inject 400 mg/m2 into the vein every 14 (fourteen) days.     lidocaine-prilocaine (EMLA) cream Apply to affected area once (Patient not taking: Reported on 03/22/2021) 30 g 3   LORazepam (ATIVAN) 0.5 MG tablet Take 1 tablet by mouth twice daily as needed for anxiety 60 tablet 3   morphine (MSIR) 15 MG tablet Take 1 tablet (15 mg total) by mouth every 6 (six) hours as needed for severe pain. 60 tablet 0   Multiple Vitamin (MULTIVITAMIN WITH MINERALS) TABS tablet Take 1 tablet by mouth daily.     omeprazole (PRILOSEC) 40 MG capsule Take 1 capsule (40 mg total) by mouth daily. 90 capsule 3   ondansetron (ZOFRAN) 8 MG tablet Take 1 tablet (8 mg total) by mouth every 8 (eight) hours as needed for nausea. (Patient not taking: Reported on 03/22/2021) 60 tablet 3   OXALIPLATIN IV Inject 85 mg/m2 into the vein every 14 (fourteen) days.     polyethylene glycol (MIRALAX) 17 g packet Take 17 g by mouth daily. 30 each 3   simvastatin (ZOCOR) 20 MG tablet SMARTSIG:1 Tablet(s) By Mouth Every Evening     sucralfate (CARAFATE) 1 GM/10ML suspension SMARTSIG:Milliliter(s) By Mouth     temazepam (RESTORIL) 15 MG capsule TAKE 1 CAPSULE BY MOUTH AT BEDTIME AS NEEDED FOR SLEEP 30 capsule 0   No current facility-administered medications for this visit.    ALLERGIES:  No Known Allergies  PHYSICAL EXAM:  Performance status (ECOG): 1 - Symptomatic but completely ambulatory  There were no vitals filed for this  visit. Wt Readings from Last 3 Encounters:  03/22/21 164 lb 14.5 oz (74.8 kg)  03/08/21 166 lb 9.6 oz (75.6 kg)  02/27/21 164 lb 11.2 oz (74.7 kg)   Physical Exam Vitals reviewed.  Constitutional:      Appearance: Normal appearance.  Cardiovascular:     Rate and Rhythm: Normal rate and regular rhythm.     Pulses: Normal pulses.  Heart sounds: Normal heart sounds.  Pulmonary:     Effort: Pulmonary effort is normal.     Breath sounds: Normal breath sounds.  Neurological:     General: No focal deficit present.     Mental Status: He is alert and oriented to person, place, and time.  Psychiatric:        Mood and Affect: Mood normal.        Behavior: Behavior normal.    LABORATORY DATA:  I have reviewed the labs as listed.  CBC Latest Ref Rng & Units 03/22/2021 03/08/2021 02/22/2021  WBC 4.0 - 10.5 K/uL 5.7 5.5 7.6  Hemoglobin 13.0 - 17.0 g/dL 12.2(L) 11.8(L) 10.9(L)  Hematocrit 39.0 - 52.0 % 36.2(L) 34.8(L) 32.6(L)  Platelets 150 - 400 K/uL 138(L) 121(L) 176   CMP Latest Ref Rng & Units 03/22/2021 03/08/2021 02/22/2021  Glucose 70 - 99 mg/dL 135(H) 139(H) 124(H)  BUN 6 - 20 mg/dL 10 10 7   Creatinine 0.61 - 1.24 mg/dL 0.57(L) 0.68 0.64  Sodium 135 - 145 mmol/L 135 134(L) 135  Potassium 3.5 - 5.1 mmol/L 3.8 3.7 3.7  Chloride 98 - 111 mmol/L 101 102 101  CO2 22 - 32 mmol/L 26 24 27   Calcium 8.9 - 10.3 mg/dL 8.6(L) 8.5(L) 8.2(L)  Total Protein 6.5 - 8.1 g/dL 6.4(L) 6.3(L) 6.4(L)  Total Bilirubin 0.3 - 1.2 mg/dL 0.8 1.1 1.5(H)  Alkaline Phos 38 - 126 U/L 342(H) 465(H) 499(H)  AST 15 - 41 U/L 46(H) 54(H) 47(H)  ALT 0 - 44 U/L 26 31 26     DIAGNOSTIC IMAGING:  I have independently reviewed the scans and discussed with the patient. No results found.   ASSESSMENT:  1.  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 - CT [facility] with contrast on 12/26/2020 showed extensive hepatic metastatic disease and  retroperitoneal adenopathy.  CT chest without contrast on 12/26/2020 showed several small subcentimeter lung nodules consistent with metastatic disease.  Liver morphology suggests underlying cirrhosis. - Liver biopsy on 01/10/2021 consistent with metastatic adenocarcinoma, CK20 and CDX2 positive, negative for CK7, TTF-1, PSA and prostein. - Colonoscopy and biopsy of the sigmoid mass on 01/13/2021 consistent with adenocarcinoma. - 10 pound weight loss prior to start of therapy. - FOLFOX started on 01/25/2021. - Foundation 1 testing on 01/10/2021 showed MSI-stable, TMB low, negative for RAS/BRAF/HER2 mutation.  2.  Social/family history: - Lives at home with his wife.  He drove truck and operated equipment.  No smoking history. - Father had prostate cancer   PLAN:  1.  Metastatic sigmoid colon cancer to liver and lungs: - He tolerated last chemotherapy reasonably well. - Finger tingling and numbness in the fingertips has been on and off. - Reviewed labs which showed slightly elevated AST of 63 normal bilirubin.  CBC was normal.  CEA improved to 278 from 1176. - Proceed with cycle 6 today.  Oxaliplatin dose reduced by 20%.  Vectibix also dose reduced. - RTC 2 weeks.  CT CAP and CEA prior to next visit.  2.  Coccygeal pain: - Continue half tablet of MS IR 15 mg at bedtime.  3.  Diabetes: - Continue NPH 7030, 8 units if blood sugar more than 200 or 5 units if more than 150.  4.  Difficulty falling/staying asleep: - Continue Restoril 15 mg at bedtime which is helping.  5.  Normocytic anemia: - Last ferritin was 137.  Hemoglobin today is 11.6.  No indication for parenteral  iron therapy.  6.  Erythematous maculopapular rash: - He was evaluated by dermatology in [place]. - He was given clobetasol cream to be used topically.  7.  EGFR antibody induced rash: - He has rash in the lower back and sacrum. - Continue doxycycline twice daily.  Hydrocortisone 1% cream is not helping. - Use clobetasol  cream prescribed by dermatology.  8.  Anxiety: - Continue Ativan 0.5 mg as needed.   Orders placed this encounter:  No orders of the defined types were placed in this encounter.    Derek Jack, MD [facility] [phone]   I, Thana Ates, am acting as a scribe for Dr. Derek Jack.  I, Derek Jack MD, have reviewed the above documentation for accuracy and completeness, and I agree with the above.

## 2021-04-09 DIAGNOSIS — L950 Livedoid vasculitis: Secondary | ICD-10-CM | POA: Insufficient documentation

## 2021-04-10 ENCOUNTER — Other Ambulatory Visit (HOSPITAL_COMMUNITY): Payer: Self-pay | Admitting: *Deleted

## 2021-04-10 ENCOUNTER — Encounter (HOSPITAL_COMMUNITY): Payer: Self-pay | Admitting: Hematology

## 2021-04-10 ENCOUNTER — Inpatient Hospital Stay (HOSPITAL_COMMUNITY): Payer: Medicaid Other

## 2021-04-10 ENCOUNTER — Inpatient Hospital Stay (HOSPITAL_BASED_OUTPATIENT_CLINIC_OR_DEPARTMENT_OTHER): Payer: Medicaid Other | Admitting: Hematology

## 2021-04-10 ENCOUNTER — Inpatient Hospital Stay (HOSPITAL_COMMUNITY): Payer: Medicaid Other | Attending: Hematology

## 2021-04-10 ENCOUNTER — Other Ambulatory Visit: Payer: Self-pay

## 2021-04-10 VITALS — BP 120/80 | HR 100 | Temp 96.1°F | Resp 18

## 2021-04-10 DIAGNOSIS — C787 Secondary malignant neoplasm of liver and intrahepatic bile duct: Secondary | ICD-10-CM | POA: Insufficient documentation

## 2021-04-10 DIAGNOSIS — C7801 Secondary malignant neoplasm of right lung: Secondary | ICD-10-CM | POA: Insufficient documentation

## 2021-04-10 DIAGNOSIS — C189 Malignant neoplasm of colon, unspecified: Secondary | ICD-10-CM

## 2021-04-10 DIAGNOSIS — Z5112 Encounter for antineoplastic immunotherapy: Secondary | ICD-10-CM | POA: Insufficient documentation

## 2021-04-10 DIAGNOSIS — Z5189 Encounter for other specified aftercare: Secondary | ICD-10-CM | POA: Diagnosis not present

## 2021-04-10 DIAGNOSIS — Z5111 Encounter for antineoplastic chemotherapy: Secondary | ICD-10-CM | POA: Insufficient documentation

## 2021-04-10 DIAGNOSIS — Z79899 Other long term (current) drug therapy: Secondary | ICD-10-CM | POA: Diagnosis not present

## 2021-04-10 DIAGNOSIS — C187 Malignant neoplasm of sigmoid colon: Secondary | ICD-10-CM | POA: Insufficient documentation

## 2021-04-10 DIAGNOSIS — C7802 Secondary malignant neoplasm of left lung: Secondary | ICD-10-CM | POA: Insufficient documentation

## 2021-04-10 DIAGNOSIS — C786 Secondary malignant neoplasm of retroperitoneum and peritoneum: Secondary | ICD-10-CM | POA: Insufficient documentation

## 2021-04-10 LAB — CBC WITH DIFFERENTIAL/PLATELET
Abs Immature Granulocytes: 0.04 10*3/uL (ref 0.00–0.07)
Basophils Absolute: 0.1 10*3/uL (ref 0.0–0.1)
Basophils Relative: 2 %
Eosinophils Absolute: 0.3 10*3/uL (ref 0.0–0.5)
Eosinophils Relative: 6 %
HCT: 33.7 % — ABNORMAL LOW (ref 39.0–52.0)
Hemoglobin: 11.6 g/dL — ABNORMAL LOW (ref 13.0–17.0)
Immature Granulocytes: 1 %
Lymphocytes Relative: 47 %
Lymphs Abs: 2.2 10*3/uL (ref 0.7–4.0)
MCH: 32.2 pg (ref 26.0–34.0)
MCHC: 34.4 g/dL (ref 30.0–36.0)
MCV: 93.6 fL (ref 80.0–100.0)
Monocytes Absolute: 0.7 10*3/uL (ref 0.1–1.0)
Monocytes Relative: 16 %
Neutro Abs: 1.3 10*3/uL — ABNORMAL LOW (ref 1.7–7.7)
Neutrophils Relative %: 28 %
Platelets: 215 10*3/uL (ref 150–400)
RBC: 3.6 MIL/uL — ABNORMAL LOW (ref 4.22–5.81)
RDW: 18.4 % — ABNORMAL HIGH (ref 11.5–15.5)
WBC: 4.6 10*3/uL (ref 4.0–10.5)
nRBC: 0 % (ref 0.0–0.2)

## 2021-04-10 LAB — COMPREHENSIVE METABOLIC PANEL
ALT: 33 U/L (ref 0–44)
AST: 63 U/L — ABNORMAL HIGH (ref 15–41)
Albumin: 2.7 g/dL — ABNORMAL LOW (ref 3.5–5.0)
Alkaline Phosphatase: 348 U/L — ABNORMAL HIGH (ref 38–126)
Anion gap: 6 (ref 5–15)
BUN: 10 mg/dL (ref 6–20)
CO2: 26 mmol/L (ref 22–32)
Calcium: 8.2 mg/dL — ABNORMAL LOW (ref 8.9–10.3)
Chloride: 103 mmol/L (ref 98–111)
Creatinine, Ser: 0.66 mg/dL (ref 0.61–1.24)
GFR, Estimated: >60 mL/min (ref >60)
Glucose, Bld: 124 mg/dL — ABNORMAL HIGH (ref 70–99)
Potassium: 3.8 mmol/L (ref 3.5–5.1)
Sodium: 135 mmol/L (ref 135–145)
Total Bilirubin: 0.6 mg/dL (ref 0.3–1.2)
Total Protein: 5.3 g/dL — ABNORMAL LOW (ref 6.5–8.1)

## 2021-04-10 LAB — MAGNESIUM: Magnesium: 2 mg/dL (ref 1.7–2.4)

## 2021-04-10 MED ORDER — SODIUM CHLORIDE 0.9 % IV SOLN
4.9000 mg/kg | Freq: Once | INTRAVENOUS | Status: AC
  Administered 2021-04-10: 400 mg via INTRAVENOUS
  Filled 2021-04-10: qty 20

## 2021-04-10 MED ORDER — DEXTROSE 5 % IV SOLN
Freq: Once | INTRAVENOUS | Status: AC
Start: 2021-04-10 — End: 2021-04-10

## 2021-04-10 MED ORDER — SODIUM CHLORIDE 0.9% FLUSH
10.0000 mL | INTRAVENOUS | Status: DC | PRN
  Administered 2021-04-10 (×2): 10 mL

## 2021-04-10 MED ORDER — SODIUM CHLORIDE 0.9 % IV SOLN
5000.0000 mg | INTRAVENOUS | Status: DC
  Administered 2021-04-10: 5000 mg via INTRAVENOUS
  Filled 2021-04-10: qty 100

## 2021-04-10 MED ORDER — LEUCOVORIN CALCIUM INJECTION 350 MG
400.0000 mg/m2 | Freq: Once | INTRAVENOUS | Status: AC
  Administered 2021-04-10: 784 mg via INTRAVENOUS
  Filled 2021-04-10: qty 39.2

## 2021-04-10 MED ORDER — PALONOSETRON HCL INJECTION 0.25 MG/5ML
0.2500 mg | Freq: Once | INTRAVENOUS | Status: AC
  Administered 2021-04-10: 0.25 mg via INTRAVENOUS
  Filled 2021-04-10: qty 5

## 2021-04-10 MED ORDER — SODIUM CHLORIDE 0.9 % IV SOLN
10.0000 mg | Freq: Once | INTRAVENOUS | Status: AC
  Administered 2021-04-10: 10 mg via INTRAVENOUS
  Filled 2021-04-10: qty 10

## 2021-04-10 MED ORDER — OXALIPLATIN CHEMO INJECTION 100 MG/20ML
68.0000 mg/m2 | Freq: Once | INTRAVENOUS | Status: AC
  Administered 2021-04-10: 135 mg via INTRAVENOUS
  Filled 2021-04-10: qty 20

## 2021-04-10 MED ORDER — MORPHINE SULFATE 15 MG PO TABS: 15.0000 mg | ORAL_TABLET | Freq: Two times a day (BID) | ORAL | 0 refills | Status: DC | PRN

## 2021-04-10 NOTE — Progress Notes (Signed)
Patient Assist/Replace for the following has been terminated. Medication: Vectibix Reason for Termination: Active Medicaid as of 03/09/2021 Last DOS: 03/08/2021  Madalyn Rob, CPhT IV Drug Replacement Specialist  [place] Phone: [phone]

## 2021-04-10 NOTE — Progress Notes (Signed)
Patients port flushed without difficulty.  Good blood return noted with no bruising or swelling noted at site.  Stable during access and blood draw.  Patient to remain accessed for treatment. 

## 2021-04-10 NOTE — Progress Notes (Signed)
ANC 1.3 on 04/10/21  Add Udenyca 6 mg SQ to pump DC day.  T.O. Dr Rhys Martini, PharmD

## 2021-04-10 NOTE — Progress Notes (Signed)
Patient has been examined, vital signs and labs have been reviewed by Dr. Katragadda. ANC, Creatinine, LFTs, hemoglobin, and platelets are within treatment parameters per Dr. Katragadda. Patient may proceed with treatment per M.D.   

## 2021-04-10 NOTE — Patient Instructions (Addendum)
[place] at [facility] Discharge Instructions  You were seen today by Dr. Delton Coombes. He went over your recent results, and you received your treatment. You will be scheduled for a CT scan of your chest, abdomen, and pelvis prior to your next appointment. Dr. Delton Coombes will see you back in 2 weeks for labs and follow up.   Thank you for choosing [place] at [facility] to provide your oncology and hematology care.  To afford each patient quality time with our provider, please arrive at least 15 minutes before your scheduled appointment time.   If you have a lab appointment with the [facility] please come in thru the Main Entrance and check in at the main information desk  You need to re-schedule your appointment should you arrive 10 or more minutes late.  We strive to give you quality time with our providers, and arriving late affects you and other patients whose appointments are after yours.  Also, if you no show three or more times for appointments you may be dismissed from the clinic at the providers discretion.     Again, thank you for choosing [facility].  Our hope is that these requests will decrease the amount of time that you wait before being seen by our physicians.       _____________________________________________________________  Should you have questions after your visit to [facility], please contact our office at [phone] between the hours of 8:00 a.m. and 4:30 p.m.  Voicemails left after 4:00 p.m. will not be returned until the following business day.  For prescription refill requests, have your pharmacy contact our office and allow 72 hours.    Cancer Center Support Programs:   > Cancer Support Group  2nd Tuesday of the month 1pm-2pm, Journey Room

## 2021-04-10 NOTE — Progress Notes (Signed)
Patient tolerated FOLFOX chemotherapy with no complaints voiced. Side effects with management reviewed understanding verbalized. Port site clean and dry with no bruising or swelling noted at site. Good blood return noted before and after administration of chemotherapy. Chemo pump connected with no alarms noted. Patient left in satisfactory condition with VSS and no s/s of distress noted.

## 2021-04-10 NOTE — Patient Instructions (Signed)
[facility]  Discharge Instructions: Thank you for choosing [facility] to provide your oncology and hematology care.  If you have a lab appointment with the [facility], please come in thru the Main Entrance and check in at the main information desk.  Wear comfortable clothing and clothing appropriate for easy access to any Portacath or PICC line.   We strive to give you quality time with your provider. You may need to reschedule your appointment if you arrive late (15 or more minutes).  Arriving late affects you and other patients whose appointments are after yours.  Also, if you miss three or more appointments without notifying the office, you may be dismissed from the clinic at the provider's discretion.      For prescription refill requests, have your pharmacy contact our office and allow 72 hours for refills to be completed.    Today you received the following chemotherapy and/or immunotherapy agents Vectibix, Oxaliplatin, leucovorin, and 5FU pump was connected.   To help prevent nausea and vomiting after your treatment, we encourage you to take your nausea medication as directed.  BELOW ARE SYMPTOMS THAT SHOULD BE REPORTED IMMEDIATELY: *FEVER GREATER THAN 100.4 F (38 C) OR HIGHER *CHILLS OR SWEATING *NAUSEA AND VOMITING THAT IS NOT CONTROLLED WITH YOUR NAUSEA MEDICATION *UNUSUAL SHORTNESS OF BREATH *UNUSUAL BRUISING OR BLEEDING *URINARY PROBLEMS (pain or burning when urinating, or frequent urination) *BOWEL PROBLEMS (unusual diarrhea, constipation, pain near the anus) TENDERNESS IN MOUTH AND THROAT WITH OR WITHOUT PRESENCE OF ULCERS (sore throat, sores in mouth, or a toothache) UNUSUAL RASH, SWELLING OR PAIN  UNUSUAL VAGINAL DISCHARGE OR ITCHING   Items with * indicate a potential emergency and should be followed up as soon as possible or go to the Emergency Department if any problems should occur.  Please show the CHEMOTHERAPY ALERT CARD or  IMMUNOTHERAPY ALERT CARD at check-in to the Emergency Department and triage nurse.  Should you have questions after your visit or need to cancel or reschedule your appointment, please contact [facility] [phone]  and follow the prompts.  Office hours are 8:00 a.m. to 4:30 p.m. Monday - Friday. Please note that voicemails left after 4:00 p.m. may not be returned until the following business day.  We are closed weekends and major holidays. You have access to a nurse at all times for urgent questions. Please call the main number to the clinic [phone] and follow the prompts.  For any non-urgent questions, you may also contact your provider using MyChart. We now offer e-Visits for anyone 28 and older to request care online for non-urgent symptoms. For details visit [email or web address].   Also download the MyChart app! Go to the app store, search "MyChart", open the app, select [facility], and log in with your MyChart username and password.  Due to Covid, a mask is required upon entering the hospital/clinic. If you do not have a mask, one will be given to you upon arrival. For doctor visits, patients may have 1 support person aged 42 or older with them. For treatment visits, patients cannot have anyone with them due to current Covid guidelines and our immunocompromised population.

## 2021-04-11 ENCOUNTER — Other Ambulatory Visit (HOSPITAL_COMMUNITY): Payer: Self-pay | Admitting: *Deleted

## 2021-04-12 ENCOUNTER — Encounter (HOSPITAL_COMMUNITY): Payer: Self-pay | Admitting: Hematology

## 2021-04-12 ENCOUNTER — Inpatient Hospital Stay (HOSPITAL_COMMUNITY): Payer: Medicaid Other

## 2021-04-12 ENCOUNTER — Other Ambulatory Visit: Payer: Self-pay

## 2021-04-12 VITALS — BP 98/64 | HR 95 | Temp 96.7°F | Resp 17

## 2021-04-12 DIAGNOSIS — Z5112 Encounter for antineoplastic immunotherapy: Secondary | ICD-10-CM | POA: Diagnosis not present

## 2021-04-12 DIAGNOSIS — C787 Secondary malignant neoplasm of liver and intrahepatic bile duct: Secondary | ICD-10-CM

## 2021-04-12 DIAGNOSIS — C189 Malignant neoplasm of colon, unspecified: Secondary | ICD-10-CM

## 2021-04-12 MED ORDER — HEPARIN SOD (PORK) LOCK FLUSH 100 UNIT/ML IV SOLN
500.0000 [IU] | Freq: Once | INTRAVENOUS | Status: AC | PRN
  Administered 2021-04-12: 500 [IU]

## 2021-04-12 MED ORDER — MORPHINE SULFATE 15 MG PO TABS: 15.0000 mg | ORAL_TABLET | Freq: Two times a day (BID) | ORAL | 0 refills | Status: DC | PRN

## 2021-04-12 MED ORDER — PEGFILGRASTIM-CBQV 6 MG/0.6ML ~~LOC~~ SOSY
6.0000 mg | PREFILLED_SYRINGE | Freq: Once | SUBCUTANEOUS | Status: AC
  Administered 2021-04-12: 6 mg via SUBCUTANEOUS
  Filled 2021-04-12: qty 0.6

## 2021-04-12 MED ORDER — SODIUM CHLORIDE 0.9% FLUSH
10.0000 mL | INTRAVENOUS | Status: DC | PRN
  Administered 2021-04-12: 10 mL

## 2021-04-12 NOTE — Patient Instructions (Signed)
[facility]  Discharge Instructions: Thank you for choosing [facility] to provide your oncology and hematology care.  If you have a lab appointment with the Cancer Center, please come in thru the Main Entrance and check in at the main information desk.  Wear comfortable clothing and clothing appropriate for easy access to any Portacath or PICC line.   We strive to give you quality time with your provider. You may need to reschedule your appointment if you arrive late (15 or more minutes).  Arriving late affects you and other patients whose appointments are after yours.  Also, if you miss three or more appointments without notifying the office, you may be dismissed from the clinic at the provider's discretion.      For prescription refill requests, have your pharmacy contact our office and allow 72 hours for refills to be completed.        To help prevent nausea and vomiting after your treatment, we encourage you to take your nausea medication as directed.  BELOW ARE SYMPTOMS THAT SHOULD BE REPORTED IMMEDIATELY: *FEVER GREATER THAN 100.4 F (38 C) OR HIGHER *CHILLS OR SWEATING *NAUSEA AND VOMITING THAT IS NOT CONTROLLED WITH YOUR NAUSEA MEDICATION *UNUSUAL SHORTNESS OF BREATH *UNUSUAL BRUISING OR BLEEDING *URINARY PROBLEMS (pain or burning when urinating, or frequent urination) *BOWEL PROBLEMS (unusual diarrhea, constipation, pain near the anus) TENDERNESS IN MOUTH AND THROAT WITH OR WITHOUT PRESENCE OF ULCERS (sore throat, sores in mouth, or a toothache) UNUSUAL RASH, SWELLING OR PAIN  UNUSUAL VAGINAL DISCHARGE OR ITCHING   Items with * indicate a potential emergency and should be followed up as soon as possible or go to the Emergency Department if any problems should occur.  Please show the CHEMOTHERAPY ALERT CARD or IMMUNOTHERAPY ALERT CARD at check-in to the Emergency Department and triage nurse.  Should you have questions after your visit or need to cancel  or reschedule your appointment, please contact [facility] [phone]  and follow the prompts.  Office hours are 8:00 a.m. to 4:30 p.m. Monday - Friday. Please note that voicemails left after 4:00 p.m. may not be returned until the following business day.  We are closed weekends and major holidays. You have access to a nurse at all times for urgent questions. Please call the main number to the clinic [phone] and follow the prompts.  For any non-urgent questions, you may also contact your provider using MyChart. We now offer e-Visits for anyone 18 and older to request care online for non-urgent symptoms. For details visit [email or web address].   Also download the MyChart app! Go to the app store, search "MyChart", open the app, select [facility], and log in with your MyChart username and password.  Due to Covid, a mask is required upon entering the hospital/clinic. If you do not have a mask, one will be given to you upon arrival. For doctor visits, patients may have 1 support person aged 18 or older with them. For treatment visits, patients cannot have anyone with them due to current Covid guidelines and our immunocompromised population.  

## 2021-04-12 NOTE — Progress Notes (Signed)
5FU pump removed.  Patients port flushed without difficulty.  Good blood return noted with no bruising or swelling noted at site.  Band aid applied.  Patient tolerated Udencya injection with no complaints voiced.  Site clean and dry with no bruising or swelling noted.  No complaints of pain.  Discharged with vital signs stable and no signs or symptoms of distress noted.

## 2021-04-19 ENCOUNTER — Other Ambulatory Visit: Payer: Self-pay

## 2021-04-19 ENCOUNTER — Ambulatory Visit (HOSPITAL_COMMUNITY)
Admission: RE | Admit: 2021-04-19 | Discharge: 2021-04-19 | Disposition: A | Discharge status: Home / Self Care | Payer: Medicaid Other | Source: Ambulatory Visit | Attending: Hematology | Admitting: Hematology

## 2021-04-19 DIAGNOSIS — C189 Malignant neoplasm of colon, unspecified: Secondary | ICD-10-CM | POA: Diagnosis not present

## 2021-04-19 DIAGNOSIS — C787 Secondary malignant neoplasm of liver and intrahepatic bile duct: Secondary | ICD-10-CM | POA: Diagnosis present

## 2021-04-19 MED ORDER — HEPARIN SOD (PORK) LOCK FLUSH 100 UNIT/ML IV SOLN
500.0000 [IU] | Freq: Once | INTRAVENOUS | Status: AC
  Administered 2021-04-19: 500 [IU] via INTRAVENOUS

## 2021-04-19 MED ORDER — IOHEXOL 350 MG/ML SOLN
80.0000 mL | Freq: Once | INTRAVENOUS | Status: AC | PRN
  Administered 2021-04-19: 80 mL via INTRAVENOUS

## 2021-04-20 MED ORDER — LIDOCAINE 5 % EX OINT: 1.0000 "application " | TOPICAL_OINTMENT | Freq: Two times a day (BID) | CUTANEOUS | 0 refills | Status: DC

## 2021-04-20 MED ORDER — CLOBETASOL PROPIONATE 0.05 % EX CREA: 1.0000 "application " | TOPICAL_CREAM | Freq: Two times a day (BID) | CUTANEOUS | 0 refills | Status: DC

## 2021-04-21 ENCOUNTER — Other Ambulatory Visit (HOSPITAL_COMMUNITY): Payer: Self-pay

## 2021-04-21 MED ORDER — LACTULOSE 20 GM/30ML PO SOLN: 20.0000 g | Freq: Every morning | ORAL | 0 refills | Status: DC

## 2021-04-22 NOTE — Progress Notes (Signed)
Henry Johns, [place] [address]   CLINIC:  Medical Oncology/Hematology  PCP:  [facility] / [place] [id number] [phone]   REASON FOR VISIT:  Follow-up for colon caner metastasized to liver  PRIOR THERAPY: none  NGS Results: not done  CURRENT THERAPY: FOLFOX every 2 weeks  BRIEF ONCOLOGIC HISTORY:  Oncology History Overview Note  MMR normal Foundation One: low mutation burden   Metastasis to liver ([place])  01/04/2021 Initial Diagnosis   Metastasis to liver ([place])   01/25/2021 -  Chemotherapy   Patient is on Treatment Plan : COLORECTAL FOLFOX q14d     Colon cancer metastasized to liver ([place])  12/26/2020 Imaging   Ct abdomen and pelvis 1. Extensive hepatic metastatic disease and retroperitoneal adenopathy. 2. A 7 mm right lung base nodule, most consistent with metastatic disease. 3. No bowel obstruction. Normal appendix. 4. Aortic Atherosclerosis (ICD10-I70.0).   12/26/2020 Imaging   CT chest 1. Several small lung nodules consistent with metastatic disease. The primary malignancy remains unclear. The liver morphology suggests underlying cirrhosis, and multifocal hepatocellular carcinoma should be included in the differential diagnosis. 2. No acute abnormality in the chest.     01/04/2021 Initial Diagnosis   Carcinoma metastatic to lung [facility])   01/10/2021 Pathology Results   A. LIVER, NEEDLE CORE BIOPSY:  -  Metastatic adenocarcinoma  -  See comment   COMMENT:   The neoplastic cells are positive for cytokeratin 20 and CDX2 but negative for cytokeratin 7, TTF-1, PSA and prostein.  The combined morphology and immunophenotype are consistent with metastasis from a  gastrointestinal primary.  Dr. Saralyn Pilar reviewed the case and agrees with the above diagnosis.     01/13/2021 Procedure   Colonoscopy - Two 3 to 5 mm polyps in the transverse colon, removed with a cold snare. Resected and retrieved. -  Malignant partially obstructing tumor at 17 cm proximal to the anus. Biopsied. Tattooed. - The distal rectum and anal verge are normal on retroflexion view.   01/13/2021 Cancer Staging   Staging form: Colon and Rectum, AJCC 8th Edition - Clinical stage from 01/13/2021: Stage IVB (cTX, cN0, pM1b) - Signed by Heath Lark, MD on 01/13/2021 Stage prefix: Initial diagnosis Total positive nodes: 0   01/13/2021 Procedure   Colonoscopy - Two 3 to 5 mm polyps in the transverse colon, removed with a cold snare. Resected and retrieved. - Malignant partially obstructing tumor at 17 cm proximal to the anus. Biopsied. Tattooed. - The distal rectum and anal verge are normal on retroflexion view.   01/13/2021 Procedure   EGD - Normal esophagus. - Z-line regular, 40 cm from the incisors. - Normal stomach. - Nodular mucosa in   01/13/2021 Pathology Results   A. DUODENUM, NODULE, BIOPSY:  - Peptic duodenitis.  - No dysplasia or malignancy.   B. COLON, SIGMOID, MASS, BIOPSY:  - Invasive adenocarcinoma.   C. COLON, TRANSVERSE, POLYPECTOMY:  - Fragment of ulcerated tissue with adenocarcinoma, see comment.  - Tubular adenoma (X2 fragments).   COMMENT:   B. MMR will be ordered   01/13/2021 Tumor Marker   Patient's tumor was tested for the following markers: CEA. Results of the tumor marker test revealed 883.   01/24/2021 Procedure   Successful right-sided port placement, with the tip of the catheter in the proximal right atrium.   Plan: Catheter ready for use.   See detailed procedure note with images in PACS. The patient tolerated the  procedure well without incident or complication and was returned to Recovery in stable conditi   01/25/2021 -  Chemotherapy   Patient is on Treatment Plan : COLORECTAL FOLFOX q14d     01/25/2021 Tumor Marker   Patient's tumor was tested for the following markers: CEA. Results of the tumor marker test revealed 1378.     CANCER STAGING: Cancer Staging Colon cancer  metastasized to liver [facility]) Staging form: Colon and Rectum, AJCC 8th Edition - Clinical stage from 01/13/2021: Stage IVB (cTX, cN0, pM1b) - Signed by Heath Lark, MD on 01/13/2021   INTERVAL HISTORY:  Henry Johns, a 60 y.o. male, returns for routine follow-up and consideration for next cycle of chemotherapy. Henry Johns was last seen on 04/10/2021.  Due for cycle #7 of FOLFOX today.   Overall, he tells me he has been feeling pretty well. He reports improved rash on his legs and rash on the back of his hands which appeared last week; he also reports mild rash on his buttock. He reports hernia on his abdomen which has turned red and is warm to the touch. He reports constipation improved with Lactulose; BM are painful, and he reports hematochezia which was present prior to treatment due to hemorrhoids. He reports BM twice daily. He has stable tingling in his fingertips which he reports is not painful and does not limit his fine motor skills. His sleep and anxiety have improved. His appetite is good.   Overall, he feels ready for next cycle of chemo today.   REVIEW OF SYSTEMS:  Review of Systems  Constitutional:  Negative for appetite change (75%) and fatigue (60%).  Gastrointestinal:  Positive for blood in stool, constipation and nausea.  Skin:  Positive for rash.  Neurological:  Positive for dizziness and numbness (fingers).  Psychiatric/Behavioral:  Negative for sleep disturbance. The patient is not nervous/anxious.   All other systems reviewed and are negative.  PAST MEDICAL/SURGICAL HISTORY:  Past Medical History:  Diagnosis Date   Diabetes mellitus without complication ([place])    Hypertension    Past Surgical History:  Procedure Laterality Date   BIOPSY  01/13/2021   Procedure: BIOPSY;  Surgeon: Harvel Quale, MD;  Location: [facility];  Service: Gastroenterology;;   CATARACT EXTRACTION Right    COLONOSCOPY WITH PROPOFOL N/A 01/13/2021   Procedure: COLONOSCOPY WITH  PROPOFOL;  Surgeon: Harvel Quale, MD;  Location: [facility];  Service: Gastroenterology;  Laterality: N/A;  11:05   ESOPHAGOGASTRODUODENOSCOPY (EGD) WITH PROPOFOL N/A 01/13/2021   Procedure: ESOPHAGOGASTRODUODENOSCOPY (EGD) WITH PROPOFOL;  Surgeon: Harvel Quale, MD;  Location: [facility];  Service: Gastroenterology;  Laterality: N/A;   IR IMAGING GUIDED PORT INSERTION  01/24/2021   LIVER BIOPSY  01/10/2021   U/S guided   POLYPECTOMY  01/13/2021   Procedure: POLYPECTOMY;  Surgeon: Harvel Quale, MD;  Location: [facility];  Service: Gastroenterology;;    SOCIAL HISTORY:  Social History   Socioeconomic History   Marital status: Married    Spouse name: Not on file   Number of children: Not on file   Years of education: Not on file   Highest education level: Not on file  Occupational History   Not on file  Tobacco Use   Smoking status: Never   Smokeless tobacco: Never  Vaping Use   Vaping Use: Never used  Substance and Sexual Activity   Alcohol use: Not Currently   Drug use: Not Currently   Sexual activity: Not Currently  Other  Topics Concern   Not on file  Social History Narrative   Married, no childer   Social Determinants of Health   Financial Resource Strain: Not on file  Food Insecurity: Not on file  Transportation Needs: No Transportation Needs   Lack of Transportation (Medical): No   Lack of Transportation (Non-Medical): No  Physical Activity: Inactive   Days of Exercise per Week: 0 days   Minutes of Exercise per Session: 0 min  Stress: Not on file  Social Connections: Not on file  Intimate Partner Violence: Not At Risk   Fear of Current or Ex-Partner: No   Emotionally Abused: No   Physically Abused: No   Sexually Abused: No    FAMILY HISTORY:  Family History  Problem Relation Age of Onset   Cancer Father 73       prostate ca    CURRENT MEDICATIONS:  Current Outpatient Medications  Medication Sig Dispense  Refill   clobetasol cream (TEMOVATE) 1.61 % Apply 1 application topically 2 (two) times daily. Apply to both hands twice a day 30 g 0   lidocaine (XYLOCAINE) 5 % ointment Apply 1 application topically in the morning and at bedtime. Apply to both hands twice a day 35.44 g 0   acetaminophen (TYLENOL) 500 MG tablet Take 1,000 mg by mouth every 6 (six) hours as needed for moderate pain. (Patient not taking: Reported on 04/10/2021)     atenolol (TENORMIN) 50 MG tablet Take 50 mg by mouth daily.     clobetasol ointment (TEMOVATE) 0.05 % Apply twice a day on affected areas for 2 weeks, then once a day for two weeks, after that use every other day     doxycycline (VIBRA-TABS) 100 MG tablet Take 1 tablet (100 mg total) by mouth 2 (two) times daily. 60 tablet 6   fluorouracil CALGB 09604 2,400 mg/m2 in sodium chloride 0.9 % 150 mL Inject 2,400 mg/m2 into the vein over 48 hr.     hydrocortisone (ANUSOL-HC) 25 MG suppository Place 1 suppository (25 mg total) rectally 2 (two) times daily. 30 suppository 0   hydrocortisone 1 % ointment Apply 1 application topically 2 (two) times daily. 60 g 6   hydrocortisone-pramoxine (PROCTOFOAM HC) rectal foam Place 1 applicator rectally every 6 (six) hours as needed for hemorrhoids or anal itching. (Patient not taking: Reported on 04/10/2021) 15 g 0   insulin NPH-regular Human (70-30) 100 UNIT/ML injection Inject 10-12 Units into the skin 2 (two) times daily.     Lactulose 20 GM/30ML SOLN Take 30 mLs (20 g total) by mouth in the morning. 450 mL 0   LEUCOVORIN CALCIUM IV Inject 400 mg/m2 into the vein every 14 (fourteen) days.     lidocaine-prilocaine (EMLA) cream Apply to affected area once (Patient not taking: Reported on 03/22/2021) 30 g 3   LORazepam (ATIVAN) 0.5 MG tablet Take 1 tablet by mouth twice daily as needed for anxiety (Patient not taking: Reported on 04/10/2021) 60 tablet 3   morphine (MSIR) 15 MG tablet Take 1 tablet (15 mg total) by mouth 2 (two) times daily as  needed for severe pain. 60 tablet 0   Multiple Vitamin (MULTIVITAMIN WITH MINERALS) TABS tablet Take 1 tablet by mouth daily.     omeprazole (PRILOSEC) 40 MG capsule Take 1 capsule (40 mg total) by mouth daily. 90 capsule 3   ondansetron (ZOFRAN) 8 MG tablet Take 1 tablet (8 mg total) by mouth every 8 (eight) hours as needed for nausea. (Patient not taking: No  sig reported) 60 tablet 3   OXALIPLATIN IV Inject 85 mg/m2 into the vein every 14 (fourteen) days.     pentoxifylline (TRENTAL) 400 MG CR tablet Take by mouth.     polyethylene glycol (MIRALAX) 17 g packet Take 17 g by mouth daily. 30 each 3   prochlorperazine (COMPAZINE) 10 MG tablet Take 1 tablet by mouth every 6 (six) hours as needed. (Patient not taking: Reported on 04/10/2021)     simvastatin (ZOCOR) 20 MG tablet SMARTSIG:1 Tablet(s) By Mouth Every Evening     sucralfate (CARAFATE) 1 GM/10ML suspension SMARTSIG:Milliliter(s) By Mouth     temazepam (RESTORIL) 15 MG capsule TAKE 1 CAPSULE BY MOUTH AT BEDTIME AS NEEDED FOR SLEEP (Patient not taking: Reported on 04/10/2021) 30 capsule 0   No current facility-administered medications for this visit.    ALLERGIES:  No Known Allergies  PHYSICAL EXAM:  Performance status (ECOG): 1 - Symptomatic but completely ambulatory  There were no vitals filed for this visit. Wt Readings from Last 3 Encounters:  04/10/21 168 lb 12.8 oz (76.6 kg)  03/22/21 164 lb 14.5 oz (74.8 kg)  03/08/21 166 lb 9.6 oz (75.6 kg)   Physical Exam Vitals reviewed.  Constitutional:      Appearance: Normal appearance.  Cardiovascular:     Rate and Rhythm: Normal rate and regular rhythm.     Pulses: Normal pulses.     Heart sounds: Normal heart sounds.  Pulmonary:     Effort: Pulmonary effort is normal.     Breath sounds: Normal breath sounds.  Skin:    Findings: Erythema and rash present. Rash is macular and papular.  Neurological:     General: No focal deficit present.     Mental Status: He is alert and  oriented to person, place, and time.  Psychiatric:        Mood and Affect: Mood normal.        Behavior: Behavior normal.    LABORATORY DATA:  I have reviewed the labs as listed.  CBC Latest Ref Rng & Units 04/10/2021 03/22/2021 03/08/2021  WBC 4.0 - 10.5 K/uL 4.6 5.7 5.5  Hemoglobin 13.0 - 17.0 g/dL 11.6(L) 12.2(L) 11.8(L)  Hematocrit 39.0 - 52.0 % 33.7(L) 36.2(L) 34.8(L)  Platelets 150 - 400 K/uL 215 138(L) 121(L)   CMP Latest Ref Rng & Units 04/10/2021 03/22/2021 03/08/2021  Glucose 70 - 99 mg/dL 124(H) 135(H) 139(H)  BUN 6 - 20 mg/dL 10 10 10   Creatinine 0.61 - 1.24 mg/dL 0.66 0.57(L) 0.68  Sodium 135 - 145 mmol/L 135 135 134(L)  Potassium 3.5 - 5.1 mmol/L 3.8 3.8 3.7  Chloride 98 - 111 mmol/L 103 101 102  CO2 22 - 32 mmol/L 26 26 24   Calcium 8.9 - 10.3 mg/dL 8.2(L) 8.6(L) 8.5(L)  Total Protein 6.5 - 8.1 g/dL 5.3(L) 6.4(L) 6.3(L)  Total Bilirubin 0.3 - 1.2 mg/dL 0.6 0.8 1.1  Alkaline Phos 38 - 126 U/L 348(H) 342(H) 465(H)  AST 15 - 41 U/L 63(H) 46(H) 54(H)  ALT 0 - 44 U/L 33 26 31    DIAGNOSTIC IMAGING:  I have independently reviewed the scans and discussed with the patient. CT CHEST ABDOMEN PELVIS W CONTRAST  Result Date: 04/21/2021 CLINICAL DATA:  Metastatic colon cancer, assess treatment response, ongoing chemotherapy EXAM: CT CHEST, ABDOMEN, AND PELVIS WITH CONTRAST TECHNIQUE: Multidetector CT imaging of the chest, abdomen and pelvis was performed following the standard protocol during bolus administration of intravenous contrast. CONTRAST:  17m OMNIPAQUE IOHEXOL 350 MG/ML SOLN, additional  oral enteric contrast COMPARISON:  12/26/2020 FINDINGS: CT CHEST FINDINGS Cardiovascular: Right chest port catheter. Normal heart size. No pericardial effusion. Mediastinum/Nodes: No enlarged mediastinal, hilar, or axillary lymph nodes. Thyroid gland, trachea, and esophagus demonstrate no significant findings. Lungs/Pleura: Occasional small bilateral pulmonary nodules are diminished in size,  for example a 0.4 cm nodule of the lateral segment right middle lobe, previously 0.6 cm (series 4, image 71), a 0.3 cm nodule of the anterior left upper lobe, previously 0.5 cm (series 4, image 38), and a 0.4 cm nodule of the peripheral left lower lobe, previously 0.7 cm (series 4 image 94). No pleural effusion or pneumothorax. Musculoskeletal: No chest wall mass. CT ABDOMEN PELVIS FINDINGS Hepatobiliary: Numerous low-attenuation lesions are again seen throughout the liver parenchyma, slightly decreased in size, an index lesion of the anterior left lobe measuring 3.1 x 2.1 cm, previously 4.4 x 3.2 cm when measured similarly (series 2, image 56), and a lesion of the anterior liver dome measuring 1.8 x 1.4 cm, previously 2.8 x 2.2 cm (series 2, image 49). Coarse, nodular, cirrhotic contour of the liver. No gallstones, gallbladder wall thickening, or biliary dilatation. Pancreas: Unremarkable. No pancreatic ductal dilatation or surrounding inflammatory changes. Spleen: Unchanged splenomegaly, maximum span 15.0 cm. Adrenals/Urinary Tract: Adrenal glands are unremarkable. Kidneys are normal, without renal calculi, solid lesion, or hydronephrosis. Bladder is unremarkable. Stomach/Bowel: Stomach is within normal limits. Appendix appears normal. No evidence of bowel wall thickening, distention, or inflammatory changes. Vascular/Lymphatic: Aortic atherosclerosis. Numerous enlarged retroperitoneal lymph nodes are diminished in size, largest aortocaval node measuring 1.9 x 1.3 cm, previously 2.8 x 1.6 cm (series 2, image 63). Previously enlarged lymph nodes in the distal sigmoid mesocolon are no longer clearly appreciated. Reproductive: No mass or other abnormality. Other: No abdominal wall hernia or abnormality. There is new small volume ascites throughout the abdomen and pelvis. Musculoskeletal: There is a new, subtle, rim sclerotic lesion of the right aspect of the L2 vertebral body (series 2, image 71, series 6, image  106). IMPRESSION: 1. Interval decrease in size of multiple pulmonary nodules. 2. Interval decrease in size of numerous liver lesions. 3. Interval decrease in size numerous enlarged retroperitoneal lymph nodes. 4. Previously enlarged lymph nodes in the distal sigmoid mesocolon are no longer clearly appreciated. 5. Constellation of findings is consistent with treatment response of metastatic disease. 6. New, subtle, rim sclerotic lesion of the right aspect of the L2 vertebral body, consistent with post treatment sclerosis of an osseous metastatic lesion. 7. New small volume ascites throughout the abdomen and pelvis. There is no appreciable peritoneal nodularity or direct evidence of peritoneal metastatic disease. 8. Primary colon malignancy is not clearly appreciated; suspect however that it is located near the rectosigmoid junction given the presence of previously enlarged adjacent mesocolonic lymph nodes and a caliber change of the bowel in this vicinity. Aortic Atherosclerosis (ICD10-I70.0). Electronically Signed   By: Delanna Ahmadi M.D.   On: 04/21/2021 11:46     ASSESSMENT:  1.  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 - CT AP with contrast on 12/26/2020 showed extensive hepatic metastatic disease and retroperitoneal adenopathy.  CT chest without contrast on 12/26/2020 showed several small subcentimeter lung nodules consistent with metastatic disease.  Liver morphology suggests underlying cirrhosis. - Liver biopsy on 01/10/2021 consistent with metastatic adenocarcinoma, CK20 and CDX2 positive, negative for CK7, TTF-1, PSA and prostein. - Colonoscopy and biopsy of the sigmoid mass on 01/13/2021 consistent with adenocarcinoma. -  10 pound weight loss prior to start of therapy. - FOLFOX started on 01/25/2021. - Foundation 1 testing on 01/10/2021 showed MSI-stable, TMB low, negative for RAS/BRAF/HER2 mutation.  2.  Social/family  history: - Lives at home with his wife.  He drove truck and operated equipment.  No smoking history. - Father had prostate cancer  PLAN:  1.  Metastatic sigmoid colon cancer to liver and lungs: - He has completed 6 cycles of FOLFOX and Vectibix. - Reviewed CT CAP from 04/19/2021.  Interval decrease in size of multiple lung nodules and decrease in size of numerous liver lesions. - CEA was improving, last 1-78. - He has mild tingling in the fingertips which is constant.  Its not affecting any of his ADLs and IADLs. - He also developed erythematous maculopapular rash on the dorsum of the hands for the last 1 week.  Continue applying steroid cream. - Reviewed his CBC which showed mild thrombocytopenia of 110.  White count is elevated secondary to growth factor.  AST was mildly elevated at 57.  Alk phos 382.  Albumin is low at 2.9. - We will proceed with his next cycle today.  RTC 2 weeks for follow-up.  2.  Coccygeal pain: - Continue half tablet of MS IR at bedtime.  3.  Diabetes: - Continue NPH 70/30, 8 units if blood sugar more than 205 units if more than 150.  4.  Difficulty falling/staying asleep: - Continue Restoril 15 mg at bedtime.  5.  Constipation: - He had intermittent rectal bleeding which was there even prior to start of therapy. - He started lactulose 30 mL daily on last Friday. - I have recommended adding stool softener. - Continue Proctofoam twice daily.  6.  Erythematous maculopapular rash: - Continue clobetasol cream prescribed by [facility] dermatology which is helping.  7.  EGFR antibody induced rash: - Continue doxycycline twice daily.  Continue clobetasol cream prescribed by dermatology.  8.  Anxiety: - Continue Ativan 0.5 mg as needed which is helping.   Orders placed this encounter:  No orders of the defined types were placed in this encounter.    Derek Jack, MD [place] [phone]   I, Thana Ates, am acting as a scribe  for Dr. Derek Jack.  I, Derek Jack MD, have reviewed the above documentation for accuracy and completeness, and I agree with the above.

## 2021-04-24 ENCOUNTER — Encounter (HOSPITAL_COMMUNITY): Payer: Self-pay | Admitting: *Deleted

## 2021-04-24 ENCOUNTER — Inpatient Hospital Stay (HOSPITAL_BASED_OUTPATIENT_CLINIC_OR_DEPARTMENT_OTHER): Payer: Medicaid Other | Admitting: Hematology

## 2021-04-24 ENCOUNTER — Inpatient Hospital Stay (HOSPITAL_COMMUNITY): Payer: Medicaid Other

## 2021-04-24 VITALS — BP 112/71 | HR 95 | Temp 96.9°F | Resp 18

## 2021-04-24 VITALS — BP 110/74 | HR 102 | Temp 96.7°F | Resp 18 | Wt 169.2 lb

## 2021-04-24 DIAGNOSIS — C189 Malignant neoplasm of colon, unspecified: Secondary | ICD-10-CM | POA: Diagnosis not present

## 2021-04-24 DIAGNOSIS — C787 Secondary malignant neoplasm of liver and intrahepatic bile duct: Secondary | ICD-10-CM

## 2021-04-24 DIAGNOSIS — Z5112 Encounter for antineoplastic immunotherapy: Secondary | ICD-10-CM | POA: Diagnosis not present

## 2021-04-24 LAB — MAGNESIUM: Magnesium: 1.9 mg/dL (ref 1.7–2.4)

## 2021-04-24 LAB — CBC WITH DIFFERENTIAL/PLATELET
Band Neutrophils: 6 %
Basophils Absolute: 0 10*3/uL (ref 0.0–0.1)
Basophils Relative: 0 %
Eosinophils Absolute: 0.7 10*3/uL — ABNORMAL HIGH (ref 0.0–0.5)
Eosinophils Relative: 4 %
HCT: 35 % — ABNORMAL LOW (ref 39.0–52.0)
Hemoglobin: 11.6 g/dL — ABNORMAL LOW (ref 13.0–17.0)
Lymphocytes Relative: 20 %
Lymphs Abs: 3.4 10*3/uL (ref 0.7–4.0)
MCH: 31.3 pg (ref 26.0–34.0)
MCHC: 33.1 g/dL (ref 30.0–36.0)
MCV: 94.3 fL (ref 80.0–100.0)
Metamyelocytes Relative: 5 %
Monocytes Absolute: 1.9 10*3/uL — ABNORMAL HIGH (ref 0.1–1.0)
Monocytes Relative: 11 %
Myelocytes: 4 %
Neutro Abs: 9.6 10*3/uL — ABNORMAL HIGH (ref 1.7–7.7)
Neutrophils Relative %: 50 %
Platelets: 110 10*3/uL — ABNORMAL LOW (ref 150–400)
RBC: 3.71 MIL/uL — ABNORMAL LOW (ref 4.22–5.81)
RDW: 18.7 % — ABNORMAL HIGH (ref 11.5–15.5)
WBC: 17.2 10*3/uL — ABNORMAL HIGH (ref 4.0–10.5)
nRBC: 0.5 % — ABNORMAL HIGH (ref 0.0–0.2)

## 2021-04-24 LAB — COMPREHENSIVE METABOLIC PANEL
ALT: 27 U/L (ref 0–44)
AST: 57 U/L — ABNORMAL HIGH (ref 15–41)
Albumin: 2.9 g/dL — ABNORMAL LOW (ref 3.5–5.0)
Alkaline Phosphatase: 382 U/L — ABNORMAL HIGH (ref 38–126)
Anion gap: 5 (ref 5–15)
BUN: 10 mg/dL (ref 6–20)
CO2: 27 mmol/L (ref 22–32)
Calcium: 8.4 mg/dL — ABNORMAL LOW (ref 8.9–10.3)
Chloride: 104 mmol/L (ref 98–111)
Creatinine, Ser: 0.64 mg/dL (ref 0.61–1.24)
GFR, Estimated: >60 mL/min (ref >60)
Glucose, Bld: 108 mg/dL — ABNORMAL HIGH (ref 70–99)
Potassium: 3.9 mmol/L (ref 3.5–5.1)
Sodium: 136 mmol/L (ref 135–145)
Total Bilirubin: 0.6 mg/dL (ref 0.3–1.2)
Total Protein: 5.7 g/dL — ABNORMAL LOW (ref 6.5–8.1)

## 2021-04-24 MED ORDER — SODIUM CHLORIDE 0.9 % IV SOLN
4.9000 mg/kg | Freq: Once | INTRAVENOUS | Status: AC
  Administered 2021-04-24: 400 mg via INTRAVENOUS
  Filled 2021-04-24: qty 20

## 2021-04-24 MED ORDER — DEXTROSE 5 % IV SOLN: Freq: Once | INTRAVENOUS | Status: AC

## 2021-04-24 MED ORDER — DEXAMETHASONE SODIUM PHOSPHATE 100 MG/10ML IJ SOLN
10.0000 mg | Freq: Once | INTRAMUSCULAR | Status: AC
  Administered 2021-04-24: 10 mg via INTRAVENOUS
  Filled 2021-04-24: qty 10

## 2021-04-24 MED ORDER — LEUCOVORIN CALCIUM INJECTION 350 MG
400.0000 mg/m2 | Freq: Once | INTRAVENOUS | Status: AC
  Administered 2021-04-24: 784 mg via INTRAVENOUS
  Filled 2021-04-24: qty 39.2

## 2021-04-24 MED ORDER — FLUOROURACIL CHEMO INJECTION 5 GM/100ML
5000.0000 mg | INTRAVENOUS | Status: DC
  Administered 2021-04-24: 5000 mg via INTRAVENOUS
  Filled 2021-04-24: qty 100

## 2021-04-24 MED ORDER — HYDROCORTISONE ACETATE 25 MG RE SUPP: 25.0000 mg | Freq: Two times a day (BID) | RECTAL | 0 refills | Status: DC

## 2021-04-24 MED ORDER — PALONOSETRON HCL INJECTION 0.25 MG/5ML
0.2500 mg | Freq: Once | INTRAVENOUS | Status: AC
  Administered 2021-04-24: 0.25 mg via INTRAVENOUS
  Filled 2021-04-24: qty 5

## 2021-04-24 MED ORDER — HEPARIN SOD (PORK) LOCK FLUSH 100 UNIT/ML IV SOLN: 500.0000 [IU] | Freq: Once | INTRAVENOUS | Status: DC | PRN

## 2021-04-24 MED ORDER — PROCTOFOAM HC 1-1 % EX FOAM: 1.0000 | Freq: Four times a day (QID) | CUTANEOUS | 0 refills | Status: DC | PRN

## 2021-04-24 MED ORDER — SODIUM CHLORIDE 0.9% FLUSH: 10.0000 mL | INTRAVENOUS | Status: DC | PRN

## 2021-04-24 MED ORDER — LEUCOVORIN CALCIUM INJECTION 350 MG
400.0000 mg/m2 | Freq: Once | INTRAVENOUS | Status: DC
  Filled 2021-04-24: qty 39.2

## 2021-04-24 MED ORDER — OXALIPLATIN CHEMO INJECTION 100 MG/20ML
68.0000 mg/m2 | Freq: Once | INTRAVENOUS | Status: AC
  Administered 2021-04-24: 135 mg via INTRAVENOUS
  Filled 2021-04-24: qty 20

## 2021-04-24 NOTE — Progress Notes (Signed)
Patient presents today for Vectibix and Folfox, okay for treatment per Dr. Delton Coombes.  Patient tolerated chemotherapy with no complaints voiced. Side effects with management reviewed understanding verbalized. Port site clean and dry with no bruising or swelling noted at site. Good blood return noted before and after administration of chemotherapy. Chemo pump connected with no alarms noted. Patient left in satisfactory condition with VSS and no s/s of distress noted.

## 2021-04-24 NOTE — Progress Notes (Signed)
Patients port flushed without difficulty.  Good blood return noted with no bruising or swelling noted at site. Stable during access and blood draw.  Patient to remain accessed for possible treatment.  

## 2021-04-24 NOTE — Patient Instructions (Signed)
[place] at [facility] Discharge Instructions  You were seen and examined today by Dr. Delton Coombes. He reviewed your CT scan, which shows your cancer is shrinking. You will receive your 7th cycle of chemotherapy today. Return as scheduled in 2 weeks for lab work, office visit, and treatment.     Thank you for choosing [place] at [facility] to provide your oncology and hematology care.  To afford each patient quality time with our provider, please arrive at least 15 minutes before your scheduled appointment time.   If you have a lab appointment with the [facility] please come in thru the Main Entrance and check in at the main information desk.  You need to re-schedule your appointment should you arrive 10 or more minutes late.  We strive to give you quality time with our providers, and arriving late affects you and other patients whose appointments are after yours.  Also, if you no show three or more times for appointments you may be dismissed from the clinic at the providers discretion.     Again, thank you for choosing [facility]  Our hope is that these requests will decrease the amount of time that you wait before being seen by our physicians.       _____________________________________________________________  Should you have questions after your visit to [facility], please contact our office at [phone] and follow the prompts.  Our office hours are 8:00 a.m. and 4:30 p.m. Monday - Friday.  Please note that voicemails left after 4:00 p.m. may not be returned until the following business day.  We are closed weekends and major holidays.  You do have access to a nurse 24-7, just call the main number to the clinic [phone] and do not press any options, hold on the line and a nurse will answer the phone.    For prescription refill requests, have your pharmacy contact our office and allow 72 hours.    Due  to Covid, you will need to wear a mask upon entering the hospital. If you do not have a mask, a mask will be given to you at the Main Entrance upon arrival. For doctor visits, patients may have 1 support person age 106 or older with them. For treatment visits, patients can not have anyone with them due to social distancing guidelines and our immunocompromised population.

## 2021-04-24 NOTE — Progress Notes (Signed)
Received correspondence from [employer] regarding prior authorization for Temazepam 15 mg capsules.  This medication has been approved for a 6 month period from 04/21/2021-10/20/2021.  [place] has been notified.

## 2021-04-24 NOTE — Patient Instructions (Signed)
[facility]  Discharge Instructions: Thank you for choosing [facility] to provide your oncology and hematology care.  If you have a lab appointment with the [facility], please come in thru the Main Entrance and check in at the main information desk.  Wear comfortable clothing and clothing appropriate for easy access to any Portacath or PICC line.   We strive to give you quality time with your provider. You may need to reschedule your appointment if you arrive late (15 or more minutes).  Arriving late affects you and other patients whose appointments are after yours.  Also, if you miss three or more appointments without notifying the office, you may be dismissed from the clinic at the provider's discretion.      For prescription refill requests, have your pharmacy contact our office and allow 72 hours for refills to be completed.    Today you received the following chemotherapy and/or immunotherapy agents Vectibix, Folfox, return as scheduled. Chemo pump connected with no alarms noted. Return as scheduled.   To help prevent nausea and vomiting after your treatment, we encourage you to take your nausea medication as directed.  BELOW ARE SYMPTOMS THAT SHOULD BE REPORTED IMMEDIATELY: *FEVER GREATER THAN 100.4 F (38 C) OR HIGHER *CHILLS OR SWEATING *NAUSEA AND VOMITING THAT IS NOT CONTROLLED WITH YOUR NAUSEA MEDICATION *UNUSUAL SHORTNESS OF BREATH *UNUSUAL BRUISING OR BLEEDING *URINARY PROBLEMS (pain or burning when urinating, or frequent urination) *BOWEL PROBLEMS (unusual diarrhea, constipation, pain near the anus) TENDERNESS IN MOUTH AND THROAT WITH OR WITHOUT PRESENCE OF ULCERS (sore throat, sores in mouth, or a toothache) UNUSUAL RASH, SWELLING OR PAIN  UNUSUAL VAGINAL DISCHARGE OR ITCHING   Items with * indicate a potential emergency and should be followed up as soon as possible or go to the Emergency Department if any problems should occur.  Please show  the CHEMOTHERAPY ALERT CARD or IMMUNOTHERAPY ALERT CARD at check-in to the Emergency Department and triage nurse.  Should you have questions after your visit or need to cancel or reschedule your appointment, please contact [facility] [phone]  and follow the prompts.  Office hours are 8:00 a.m. to 4:30 p.m. Monday - Friday. Please note that voicemails left after 4:00 p.m. may not be returned until the following business day.  We are closed weekends and major holidays. You have access to a nurse at all times for urgent questions. Please call the main number to the clinic [phone] and follow the prompts.  For any non-urgent questions, you may also contact your provider using MyChart. We now offer e-Visits for anyone 1 and older to request care online for non-urgent symptoms. For details visit [email or web address].   Also download the MyChart app! Go to the app store, search "MyChart", open the app, select [facility], and log in with your MyChart username and password.  Due to Covid, a mask is required upon entering the hospital/clinic. If you do not have a mask, one will be given to you upon arrival. For doctor visits, patients may have 1 support person aged 59 or older with them. For treatment visits, patients cannot have anyone with them due to current Covid guidelines and our immunocompromised population.

## 2021-04-25 ENCOUNTER — Other Ambulatory Visit (HOSPITAL_COMMUNITY): Payer: Self-pay | Admitting: *Deleted

## 2021-04-26 ENCOUNTER — Inpatient Hospital Stay (HOSPITAL_COMMUNITY): Payer: Medicaid Other

## 2021-04-26 ENCOUNTER — Other Ambulatory Visit (HOSPITAL_COMMUNITY): Payer: Self-pay | Admitting: *Deleted

## 2021-04-26 ENCOUNTER — Encounter (HOSPITAL_COMMUNITY): Payer: Self-pay | Admitting: *Deleted

## 2021-04-26 ENCOUNTER — Telehealth (HOSPITAL_COMMUNITY): Payer: Self-pay | Admitting: *Deleted

## 2021-04-26 ENCOUNTER — Other Ambulatory Visit: Payer: Self-pay

## 2021-04-26 VITALS — BP 98/61 | HR 102 | Temp 96.7°F | Resp 18

## 2021-04-26 DIAGNOSIS — Z5112 Encounter for antineoplastic immunotherapy: Secondary | ICD-10-CM | POA: Diagnosis not present

## 2021-04-26 DIAGNOSIS — C787 Secondary malignant neoplasm of liver and intrahepatic bile duct: Secondary | ICD-10-CM

## 2021-04-26 DIAGNOSIS — C189 Malignant neoplasm of colon, unspecified: Secondary | ICD-10-CM

## 2021-04-26 MED ORDER — HEPARIN SOD (PORK) LOCK FLUSH 100 UNIT/ML IV SOLN
500.0000 [IU] | Freq: Once | INTRAVENOUS | Status: AC | PRN
  Administered 2021-04-26: 500 [IU]

## 2021-04-26 MED ORDER — PEGFILGRASTIM-CBQV 6 MG/0.6ML ~~LOC~~ SOSY
6.0000 mg | PREFILLED_SYRINGE | Freq: Once | SUBCUTANEOUS | Status: AC
  Administered 2021-04-26: 6 mg via SUBCUTANEOUS
  Filled 2021-04-26: qty 0.6

## 2021-04-26 MED ORDER — PROCTOFOAM HC 1-1 % EX FOAM: 1.0000 | Freq: Two times a day (BID) | CUTANEOUS | 6 refills | Status: DC

## 2021-04-26 MED ORDER — SODIUM CHLORIDE 0.9% FLUSH
10.0000 mL | INTRAVENOUS | Status: DC | PRN
  Administered 2021-04-26: 10 mL

## 2021-04-26 NOTE — Progress Notes (Signed)
Patient presents today for 5FU pump discontinuation.  Pump removed and port flushed with 10 mL saline and 5 mL Heparin.  Port flushed without difficulty and good blood return was noted.   Patient tolerated well with no complaints.  Patient left ambulatory in stable condition.

## 2021-04-26 NOTE — Telephone Encounter (Signed)
Patient states that he has been getting light headed daily and his systolic pressures are averaging in the 80's.  Encouraged him to maintain hydration and check blood pressure every day and hold atenolol if his systolic pressure is below 130.  Advised him to notify us if this does not improve and report to the Emergency room if symptoms worsen.  Verbalized understanding.

## 2021-04-26 NOTE — Patient Instructions (Signed)
[facility]  Discharge Instructions: Thank you for choosing [facility] to provide your oncology and hematology care.  If you have a lab appointment with the Cancer Center, please come in thru the Main Entrance and check in at the main information desk.  Wear comfortable clothing and clothing appropriate for easy access to any Portacath or PICC line.   We strive to give you quality time with your provider. You may need to reschedule your appointment if you arrive late (15 or more minutes).  Arriving late affects you and other patients whose appointments are after yours.  Also, if you miss three or more appointments without notifying the office, you may be dismissed from the clinic at the provider's discretion.      For prescription refill requests, have your pharmacy contact our office and allow 72 hours for refills to be completed.        To help prevent nausea and vomiting after your treatment, we encourage you to take your nausea medication as directed.  BELOW ARE SYMPTOMS THAT SHOULD BE REPORTED IMMEDIATELY: *FEVER GREATER THAN 100.4 F (38 C) OR HIGHER *CHILLS OR SWEATING *NAUSEA AND VOMITING THAT IS NOT CONTROLLED WITH YOUR NAUSEA MEDICATION *UNUSUAL SHORTNESS OF BREATH *UNUSUAL BRUISING OR BLEEDING *URINARY PROBLEMS (pain or burning when urinating, or frequent urination) *BOWEL PROBLEMS (unusual diarrhea, constipation, pain near the anus) TENDERNESS IN MOUTH AND THROAT WITH OR WITHOUT PRESENCE OF ULCERS (sore throat, sores in mouth, or a toothache) UNUSUAL RASH, SWELLING OR PAIN  UNUSUAL VAGINAL DISCHARGE OR ITCHING   Items with * indicate a potential emergency and should be followed up as soon as possible or go to the Emergency Department if any problems should occur.  Please show the CHEMOTHERAPY ALERT CARD or IMMUNOTHERAPY ALERT CARD at check-in to the Emergency Department and triage nurse.  Should you have questions after your visit or need to cancel  or reschedule your appointment, please contact [facility] [phone]  and follow the prompts.  Office hours are 8:00 a.m. to 4:30 p.m. Monday - Friday. Please note that voicemails left after 4:00 p.m. may not be returned until the following business day.  We are closed weekends and major holidays. You have access to a nurse at all times for urgent questions. Please call the main number to the clinic [phone] and follow the prompts.  For any non-urgent questions, you may also contact your provider using MyChart. We now offer e-Visits for anyone 18 and older to request care online for non-urgent symptoms. For details visit [email or web address].   Also download the MyChart app! Go to the app store, search "MyChart", open the app, select [facility], and log in with your MyChart username and password.  Due to Covid, a mask is required upon entering the hospital/clinic. If you do not have a mask, one will be given to you upon arrival. For doctor visits, patients may have 1 support person aged 18 or older with them. For treatment visits, patients cannot have anyone with them due to current Covid guidelines and our immunocompromised population.  

## 2021-04-27 ENCOUNTER — Inpatient Hospital Stay (HOSPITAL_BASED_OUTPATIENT_CLINIC_OR_DEPARTMENT_OTHER): Payer: Medicaid Other | Admitting: Physician Assistant

## 2021-04-27 ENCOUNTER — Inpatient Hospital Stay (HOSPITAL_COMMUNITY): Payer: Medicaid Other

## 2021-04-27 ENCOUNTER — Other Ambulatory Visit (HOSPITAL_COMMUNITY): Payer: Self-pay | Admitting: Physician Assistant

## 2021-04-27 ENCOUNTER — Encounter (HOSPITAL_COMMUNITY): Payer: Self-pay | Admitting: Hematology

## 2021-04-27 VITALS — BP 97/63 | HR 102 | Temp 96.6°F | Resp 18

## 2021-04-27 DIAGNOSIS — E875 Hyperkalemia: Secondary | ICD-10-CM

## 2021-04-27 DIAGNOSIS — Z09 Encounter for follow-up examination after completed treatment for conditions other than malignant neoplasm: Secondary | ICD-10-CM

## 2021-04-27 DIAGNOSIS — C787 Secondary malignant neoplasm of liver and intrahepatic bile duct: Secondary | ICD-10-CM

## 2021-04-27 DIAGNOSIS — E860 Dehydration: Secondary | ICD-10-CM | POA: Diagnosis not present

## 2021-04-27 DIAGNOSIS — C189 Malignant neoplasm of colon, unspecified: Secondary | ICD-10-CM

## 2021-04-27 DIAGNOSIS — I959 Hypotension, unspecified: Secondary | ICD-10-CM | POA: Diagnosis not present

## 2021-04-27 DIAGNOSIS — Z5112 Encounter for antineoplastic immunotherapy: Secondary | ICD-10-CM | POA: Diagnosis not present

## 2021-04-27 LAB — COMPREHENSIVE METABOLIC PANEL
ALT: 29 U/L (ref 0–44)
AST: 86 U/L — ABNORMAL HIGH (ref 15–41)
Albumin: 3 g/dL — ABNORMAL LOW (ref 3.5–5.0)
Alkaline Phosphatase: 481 U/L — ABNORMAL HIGH (ref 38–126)
Anion gap: 11 (ref 5–15)
BUN: 13 mg/dL (ref 6–20)
CO2: 23 mmol/L (ref 22–32)
Calcium: 8.6 mg/dL — ABNORMAL LOW (ref 8.9–10.3)
Chloride: 103 mmol/L (ref 98–111)
Creatinine, Ser: 0.71 mg/dL (ref 0.61–1.24)
GFR, Estimated: >60 mL/min (ref >60)
Glucose, Bld: 83 mg/dL (ref 70–99)
Potassium: 5.9 mmol/L — ABNORMAL HIGH (ref 3.5–5.1)
Sodium: 137 mmol/L (ref 135–145)
Total Bilirubin: 1.8 mg/dL — ABNORMAL HIGH (ref 0.3–1.2)
Total Protein: 5.8 g/dL — ABNORMAL LOW (ref 6.5–8.1)

## 2021-04-27 LAB — PHOSPHORUS: Phosphorus: 4 mg/dL (ref 2.5–4.6)

## 2021-04-27 LAB — URIC ACID: Uric Acid, Serum: 5.2 mg/dL (ref 3.7–8.6)

## 2021-04-27 LAB — MAGNESIUM: Magnesium: 2.2 mg/dL (ref 1.7–2.4)

## 2021-04-27 MED ORDER — DEXTROSE 50 % IV SOLN
50.0000 mL | Freq: Once | INTRAVENOUS | Status: AC
  Administered 2021-04-27: 50 mL via INTRAVENOUS
  Filled 2021-04-27: qty 50

## 2021-04-27 MED ORDER — SODIUM CHLORIDE 0.9 % IV SOLN: INTRAVENOUS | Status: DC

## 2021-04-27 MED ORDER — ONDANSETRON HCL 4 MG/2ML IJ SOLN
4.0000 mg | Freq: Once | INTRAMUSCULAR | Status: AC
  Administered 2021-04-27: 4 mg via INTRAVENOUS
  Filled 2021-04-27: qty 2

## 2021-04-27 MED ORDER — INSULIN ASPART 100 UNIT/ML IV SOLN
10.0000 [IU] | Freq: Once | INTRAVENOUS | Status: AC
  Administered 2021-04-27: 10 [IU] via INTRAVENOUS
  Filled 2021-04-27: qty 0.1

## 2021-04-27 MED ORDER — SODIUM POLYSTYRENE SULFONATE 15 GM/60ML PO SUSP
60.0000 g | Freq: Once | ORAL | Status: AC
  Administered 2021-04-27: 60 g via ORAL
  Filled 2021-04-27: qty 240

## 2021-04-27 MED ORDER — HEPARIN SOD (PORK) LOCK FLUSH 100 UNIT/ML IV SOLN
500.0000 [IU] | Freq: Once | INTRAVENOUS | Status: AC
  Administered 2021-04-27: 500 [IU] via INTRAVENOUS

## 2021-04-27 NOTE — Progress Notes (Signed)
[facility] [address], [place] [address] Phone: [phone] Fax: [facility] PROGRESS NOTE   Henry Johns [id number] 02-06-1961 60 y.o.  Henry Johns is managed by Dr. 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  Actively treated with chemotherapy/immunotherapy/hormonal therapy: YES  Current therapy: FOLFOX every 2 weeks  Last treated: 04/26/2021  Next scheduled appointment with provider: 05/08/2021  Subjective:  Chief Complaint: Weakness and fatigue  Henry Johns is managed by Dr. Delton Coombes for treatment of his colon cancer with metastases to the liver.  Patient is on FOLFOX every 2 weeks.  He finished his most recent cycle of FOLFOX yesterday, and had his pump discontinued in clinic yesterday.  He reports that it is typical for him to feel a bit tired after he is finished a round of treatment, but reports that this morning he woke up and felt extremely weak with lightheadedness and nausea.  He took his blood pressure at home and noted that it was low.  He does admit that he is "not eating and drinking enough," which he in part attributes to cold sensitivity from the FOLFOX.  Pertinent positives are generalized fatigue, weakness, lightheadedness, nausea, poor appetite, and constipation. He denies any vomiting, diarrhea, fever, chills.  He reports 25% energy and 40% appetite.  Review of Systems:  Review of Systems  Constitutional:  Positive for activity change and fatigue. Negative for chills and fever.  Respiratory:  Negative for chest tightness and shortness of breath.   Cardiovascular:  Negative for chest pain.  Gastrointestinal:  Positive for constipation and nausea. Negative for abdominal pain, diarrhea and vomiting.  Endocrine: Positive for cold intolerance.  Neurological:  Positive for weakness and light-headedness. Negative for syncope.    Past Medical History, Surgical  history, Social history, and Family history were reviewed as documented elsewhere in chart, and were updated as appropriate.   Objective:   Physical Exam:  Blood pressure: 87/66 Heart rate: 116 bpm Respiratory rate: 18 respirations per minute Temperature (oral): 97.6 F Oxygen saturation: 100% on room air ECOG: 2  Physical Exam Constitutional:      Appearance: Normal appearance.     Comments: Somewhat weak and fatigued appearing  HENT:     Head: Normocephalic and atraumatic.     Mouth/Throat:     Mouth: Mucous membranes are moist.  Eyes:     Extraocular Movements: Extraocular movements intact.     Pupils: Pupils are equal, round, and reactive to light.  Cardiovascular:     Rate and Rhythm: Normal rate and regular rhythm.     Pulses: Normal pulses.     Heart sounds: Normal heart sounds.  Pulmonary:     Effort: Pulmonary effort is normal.     Breath sounds: Normal breath sounds.  Abdominal:     General: Bowel sounds are normal.     Palpations: Abdomen is soft.     Tenderness: There is no abdominal tenderness.  Musculoskeletal:        General: No swelling.     Right lower leg: No edema.     Left lower leg: No edema.  Lymphadenopathy:     Cervical: No cervical adenopathy.  Skin:    General: Skin is warm and dry.  Neurological:     General: No focal deficit present.     Mental Status: He is alert and oriented to person, place, and time.  Psychiatric:        Mood  and Affect: Mood normal.        Behavior: Behavior normal.    Lab Review:     Component Value Date/Time   NA 137 04/27/2021 0934   K 5.9 (H) 04/27/2021 0934   CL 103 04/27/2021 0934   CO2 23 04/27/2021 0934   GLUCOSE 83 04/27/2021 0934   BUN 13 04/27/2021 0934   CREATININE 0.71 04/27/2021 0934   CALCIUM 8.6 (L) 04/27/2021 0934   PROT 5.8 (L) 04/27/2021 0934   ALBUMIN 3.0 (L) 04/27/2021 0934   AST 86 (H) 04/27/2021 0934   ALT 29 04/27/2021 0934   ALKPHOS 481 (H) 04/27/2021 0934   BILITOT 1.8 (H)  04/27/2021 0934   GFRNONAA >60 04/27/2021 0934       Component Value Date/Time   WBC 17.2 (H) 04/24/2021 0814   RBC 3.71 (L) 04/24/2021 0814   HGB 11.6 (L) 04/24/2021 0814   HCT 35.0 (L) 04/24/2021 0814   PLT 110 (L) 04/24/2021 0814   MCV 94.3 04/24/2021 0814   MCH 31.3 04/24/2021 0814   MCHC 33.1 04/24/2021 0814   RDW 18.7 (H) 04/24/2021 0814   LYMPHSABS 3.4 04/24/2021 0814   MONOABS 1.9 (H) 04/24/2021 0814   EOSABS 0.7 (H) 04/24/2021 0814   BASOSABS 0.0 04/24/2021 0814   -------------------------------  Imaging from last 24 hours (if applicable):  Radiology interpretation: CT CHEST ABDOMEN PELVIS W CONTRAST  Result Date: 04/21/2021 CLINICAL DATA:  Metastatic colon cancer, assess treatment response, ongoing chemotherapy EXAM: CT CHEST, ABDOMEN, AND PELVIS WITH CONTRAST TECHNIQUE: Multidetector CT imaging of the chest, abdomen and pelvis was performed following the standard protocol during bolus administration of intravenous contrast. CONTRAST:  76mL OMNIPAQUE IOHEXOL 350 MG/ML SOLN, additional oral enteric contrast COMPARISON:  12/26/2020 FINDINGS: CT CHEST FINDINGS Cardiovascular: Right chest port catheter. Normal heart size. No pericardial effusion. Mediastinum/Nodes: No enlarged mediastinal, hilar, or axillary lymph nodes. Thyroid gland, trachea, and esophagus demonstrate no significant findings. Lungs/Pleura: Occasional small bilateral pulmonary nodules are diminished in size, for example a 0.4 cm nodule of the lateral segment right middle lobe, previously 0.6 cm (series 4, image 71), a 0.3 cm nodule of the anterior left upper lobe, previously 0.5 cm (series 4, image 38), and a 0.4 cm nodule of the peripheral left lower lobe, previously 0.7 cm (series 4 image 94). No pleural effusion or pneumothorax. Musculoskeletal: No chest wall mass. CT ABDOMEN PELVIS FINDINGS Hepatobiliary: Numerous low-attenuation lesions are again seen throughout the liver parenchyma, slightly decreased in  size, an index lesion of the anterior left lobe measuring 3.1 x 2.1 cm, previously 4.4 x 3.2 cm when measured similarly (series 2, image 56), and a lesion of the anterior liver dome measuring 1.8 x 1.4 cm, previously 2.8 x 2.2 cm (series 2, image 49). Coarse, nodular, cirrhotic contour of the liver. No gallstones, gallbladder wall thickening, or biliary dilatation. Pancreas: Unremarkable. No pancreatic ductal dilatation or surrounding inflammatory changes. Spleen: Unchanged splenomegaly, maximum span 15.0 cm. Adrenals/Urinary Tract: Adrenal glands are unremarkable. Kidneys are normal, without renal calculi, solid lesion, or hydronephrosis. Bladder is unremarkable. Stomach/Bowel: Stomach is within normal limits. Appendix appears normal. No evidence of bowel wall thickening, distention, or inflammatory changes. Vascular/Lymphatic: Aortic atherosclerosis. Numerous enlarged retroperitoneal lymph nodes are diminished in size, largest aortocaval node measuring 1.9 x 1.3 cm, previously 2.8 x 1.6 cm (series 2, image 63). Previously enlarged lymph nodes in the distal sigmoid mesocolon are no longer clearly appreciated. Reproductive: No mass or other abnormality. Other: No abdominal wall hernia or abnormality.  There is new small volume ascites throughout the abdomen and pelvis. Musculoskeletal: There is a new, subtle, rim sclerotic lesion of the right aspect of the L2 vertebral body (series 2, image 71, series 6, image 106). IMPRESSION: 1. Interval decrease in size of multiple pulmonary nodules. 2. Interval decrease in size of numerous liver lesions. 3. Interval decrease in size numerous enlarged retroperitoneal lymph nodes. 4. Previously enlarged lymph nodes in the distal sigmoid mesocolon are no longer clearly appreciated. 5. Constellation of findings is consistent with treatment response of metastatic disease. 6. New, subtle, rim sclerotic lesion of the right aspect of the L2 vertebral body, consistent with post treatment  sclerosis of an osseous metastatic lesion. 7. New small volume ascites throughout the abdomen and pelvis. There is no appreciable peritoneal nodularity or direct evidence of peritoneal metastatic disease. 8. Primary colon malignancy is not clearly appreciated; suspect however that it is located near the rectosigmoid junction given the presence of previously enlarged adjacent mesocolonic lymph nodes and a caliber change of the bowel in this vicinity. Aortic Atherosclerosis (ICD10-I70.0). Electronically Signed   By: Delanna Ahmadi M.D.   On: 04/21/2021 11:46      Assessment & Plan:    1.  Symptomatic dehydration with mild hypotension - Vital signs significant for BP 87/66 and HR 116 - Symptomatic with fatigue, generalized weakness, and lightheadedness - Secondary to poor oral intake following chemotherapy and due to cold sensitivity side effect from FOLFOX - Labs today (04/27/2021): Normal creatinine/BUN, LFTs chronically elevated due to liver metastases - PLAN: We will give 1 L NS in clinic today - RTC tomorrow for additional fluids if needed - Encouraged increased oral intake at home, focusing on room temperature or warm water to maintain hydration  2.  Hyperkalemia - CMP (04/27/2021) shows elevated potassium 5.9. - No current clinical concern for tumor lysis syndrome - mildly low serum calcium at 8.6 (corrected calcium is normal at 9.4); phosphorus and uric acid normal - PLAN: Treated in clinic with 10 units IV insulin and 1 amp of D50.  Given Kayexalate 15 g per 60 mL to take at home this afternoon. - Repeat labs tomorrow with CMP, uric acid and phosphorus - Patient instructed to stop taking his multivitamin at home, since this may contain potassium   All questions were answered. The patient knows to call the clinic with any problems, questions or concerns.  Medical decision making: Moderate  Time spent on visit: I spent 30 minutes counseling the patient face to face. The total time spent  in the appointment was 40 minutes and more than 50% was on counseling.   Harriett Rush, PA-C  04/27/2021 1:04 PM

## 2021-04-27 NOTE — Progress Notes (Signed)
Pt asked for something for nausea. Notified PA, will give zofran per orders.  Hydration fluids and antiemetic given per orders. Patient tolerated it well without problems. Vitals stable and discharged home from clinic ambulatory. Follow up as scheduled.

## 2021-04-27 NOTE — Patient Instructions (Addendum)
[facility] at [facility] Discharge Instructions  You were seen today by Tarri Abernethy PA-C for your symptom management visit.  I suspect that the cause of your low blood pressure, lightheadedness, and nausea is dehydration. - We will give you 1 L fluids during today's visit. - Return tomorrow for repeat labs and another possible treatment of IV fluids. - Make sure that you are drinking at least 64 ounces of fluids per day.  Due to cold insensitivity side effect of FOLFOX, drink room temperature water or warmed up water. - Increase your food intake is much as you are able to maintain adequate nutrition and avoid weight loss during treatment.  We will also treat you for elevated potassium today.  It is important that you STOP taking your multivitamin at home for the time being, as this may have potassium in it.  Otherwise, your next scheduled appointment is with Dr. Delton Coombes on 05/08/2021.  However, if you feel worse or develop any new symptoms, please do not hesitate to call and schedule another symptom management visit.    Thank you for choosing [facility] to provide your oncology and hematology care.  To afford each patient quality time with our provider, please arrive at least 15 minutes before your scheduled appointment time.   If you have a lab appointment with the [facility] please come in thru the Main Entrance and check in at the main information desk.  You need to re-schedule your appointment should you arrive 10 or more minutes late.  We strive to give you quality time with our providers, and arriving late affects you and other patients whose appointments are after yours.  Also, if you no show three or more times for appointments you may be dismissed from the clinic at the providers discretion.     Again, thank you for choosing [facility].  Our hope is that these requests will decrease the amount of  time that you wait before being seen by our physicians.       _____________________________________________________________  Should you have questions after your visit to [facility], please contact our office at [phone] and follow the prompts.  Our office hours are 8:00 a.m. and 4:30 p.m. Monday - Friday.  Please note that voicemails left after 4:00 p.m. may not be returned until the following business day.  We are closed weekends and major holidays.  You do have access to a nurse 24-7, just call the main number to the clinic [phone] and do not press any options, hold on the line and a nurse will answer the phone.    For prescription refill requests, have your pharmacy contact our office and allow 72 hours.    Due to Covid, you will need to wear a mask upon entering the hospital. If you do not have a mask, a mask will be given to you at the Main Entrance upon arrival. For doctor visits, patients may have 1 support person age 58 or older with them. For treatment visits, patients can not have anyone with them due to social distancing guidelines and our immunocompromised population.

## 2021-04-27 NOTE — Progress Notes (Signed)
Patient presents today for symptom management with R Pennington PA. Message received from R Pennington to infuse 1 Liter of Normal Saline over 2 hours. Potassium 5.9 today. Schedule patient to return to the clinic tomorrow for possible fluids and recheck CMP, Phos, and uric acid acid.    Message received through secure chat/ Dr. Delton Coombes give 1 Amp of Dextrose 50%, 10 Units of Insulin IV,  and Kayexalate 15GM / 60 ML to take home and treat Hyperkalemia.

## 2021-04-27 NOTE — Patient Instructions (Signed)
[facility]  Discharge Instructions: Thank you for choosing [facility] to provide your oncology and hematology care.  If you have a lab appointment with the Cancer Center, please come in thru the Main Entrance and check in at the main information desk.  Wear comfortable clothing and clothing appropriate for easy access to any Portacath or PICC line.   We strive to give you quality time with your provider. You may need to reschedule your appointment if you arrive late (15 or more minutes).  Arriving late affects you and other patients whose appointments are after yours.  Also, if you miss three or more appointments without notifying the office, you may be dismissed from the clinic at the provider's discretion.      For prescription refill requests, have your pharmacy contact our office and allow 72 hours for refills to be completed.        To help prevent nausea and vomiting after your treatment, we encourage you to take your nausea medication as directed.  BELOW ARE SYMPTOMS THAT SHOULD BE REPORTED IMMEDIATELY: *FEVER GREATER THAN 100.4 F (38 C) OR HIGHER *CHILLS OR SWEATING *NAUSEA AND VOMITING THAT IS NOT CONTROLLED WITH YOUR NAUSEA MEDICATION *UNUSUAL SHORTNESS OF BREATH *UNUSUAL BRUISING OR BLEEDING *URINARY PROBLEMS (pain or burning when urinating, or frequent urination) *BOWEL PROBLEMS (unusual diarrhea, constipation, pain near the anus) TENDERNESS IN MOUTH AND THROAT WITH OR WITHOUT PRESENCE OF ULCERS (sore throat, sores in mouth, or a toothache) UNUSUAL RASH, SWELLING OR PAIN  UNUSUAL VAGINAL DISCHARGE OR ITCHING   Items with * indicate a potential emergency and should be followed up as soon as possible or go to the Emergency Department if any problems should occur.  Please show the CHEMOTHERAPY ALERT CARD or IMMUNOTHERAPY ALERT CARD at check-in to the Emergency Department and triage nurse.  Should you have questions after your visit or need to cancel  or reschedule your appointment, please contact [facility] [phone]  and follow the prompts.  Office hours are 8:00 a.m. to 4:30 p.m. Monday - Friday. Please note that voicemails left after 4:00 p.m. may not be returned until the following business day.  We are closed weekends and major holidays. You have access to a nurse at all times for urgent questions. Please call the main number to the clinic [phone] and follow the prompts.  For any non-urgent questions, you may also contact your provider using MyChart. We now offer e-Visits for anyone 18 and older to request care online for non-urgent symptoms. For details visit [email or web address].   Also download the MyChart app! Go to the app store, search "MyChart", open the app, select [facility], and log in with your MyChart username and password.  Due to Covid, a mask is required upon entering the hospital/clinic. If you do not have a mask, one will be given to you upon arrival. For doctor visits, patients may have 1 support person aged 18 or older with them. For treatment visits, patients cannot have anyone with them due to current Covid guidelines and our immunocompromised population.  

## 2021-04-28 ENCOUNTER — Inpatient Hospital Stay (HOSPITAL_COMMUNITY): Payer: Medicaid Other

## 2021-04-28 ENCOUNTER — Other Ambulatory Visit: Payer: Self-pay

## 2021-04-28 ENCOUNTER — Encounter (HOSPITAL_COMMUNITY): Payer: Self-pay

## 2021-04-28 VITALS — HR 105 | Temp 98.0°F

## 2021-04-28 DIAGNOSIS — E875 Hyperkalemia: Secondary | ICD-10-CM

## 2021-04-28 DIAGNOSIS — Z5112 Encounter for antineoplastic immunotherapy: Secondary | ICD-10-CM | POA: Diagnosis not present

## 2021-04-28 DIAGNOSIS — Z95828 Presence of other vascular implants and grafts: Secondary | ICD-10-CM

## 2021-04-28 DIAGNOSIS — C787 Secondary malignant neoplasm of liver and intrahepatic bile duct: Secondary | ICD-10-CM

## 2021-04-28 DIAGNOSIS — C189 Malignant neoplasm of colon, unspecified: Secondary | ICD-10-CM

## 2021-04-28 LAB — PHOSPHORUS: Phosphorus: 3.9 mg/dL (ref 2.5–4.6)

## 2021-04-28 LAB — COMPREHENSIVE METABOLIC PANEL
ALT: 26 U/L (ref 0–44)
AST: 65 U/L — ABNORMAL HIGH (ref 15–41)
Albumin: 2.8 g/dL — ABNORMAL LOW (ref 3.5–5.0)
Alkaline Phosphatase: 530 U/L — ABNORMAL HIGH (ref 38–126)
Anion gap: 10 (ref 5–15)
BUN: 14 mg/dL (ref 6–20)
CO2: 25 mmol/L (ref 22–32)
Calcium: 8.1 mg/dL — ABNORMAL LOW (ref 8.9–10.3)
Chloride: 103 mmol/L (ref 98–111)
Creatinine, Ser: 0.56 mg/dL — ABNORMAL LOW (ref 0.61–1.24)
GFR, Estimated: >60 mL/min (ref >60)
Glucose, Bld: 118 mg/dL — ABNORMAL HIGH (ref 70–99)
Potassium: 3 mmol/L — ABNORMAL LOW (ref 3.5–5.1)
Sodium: 138 mmol/L (ref 135–145)
Total Bilirubin: 1 mg/dL (ref 0.3–1.2)
Total Protein: 5.4 g/dL — ABNORMAL LOW (ref 6.5–8.1)

## 2021-04-28 LAB — CBC WITH DIFFERENTIAL/PLATELET
Basophils Absolute: 0 10*3/uL (ref 0.0–0.1)
Basophils Relative: 0 %
Eosinophils Absolute: 0 10*3/uL (ref 0.0–0.5)
Eosinophils Relative: 0 %
HCT: 31.8 % — ABNORMAL LOW (ref 39.0–52.0)
Hemoglobin: 11.1 g/dL — ABNORMAL LOW (ref 13.0–17.0)
Lymphocytes Relative: 1 %
Lymphs Abs: 0.8 10*3/uL (ref 0.7–4.0)
MCH: 32.3 pg (ref 26.0–34.0)
MCHC: 34.9 g/dL (ref 30.0–36.0)
MCV: 92.4 fL (ref 80.0–100.0)
Metamyelocytes Relative: 7 %
Monocytes Absolute: 0 10*3/uL — ABNORMAL LOW (ref 0.1–1.0)
Monocytes Relative: 0 %
Myelocytes: 1 %
Neutro Abs: 73.6 10*3/uL — ABNORMAL HIGH (ref 1.7–7.7)
Neutrophils Relative %: 91 %
Platelets: 131 10*3/uL — ABNORMAL LOW (ref 150–400)
RBC: 3.44 MIL/uL — ABNORMAL LOW (ref 4.22–5.81)
RDW: 18.6 % — ABNORMAL HIGH (ref 11.5–15.5)
WBC: 80.9 10*3/uL (ref 4.0–10.5)
nRBC: 0 % (ref 0.0–0.2)

## 2021-04-28 LAB — CEA: CEA: 56.6 ng/mL — ABNORMAL HIGH (ref 0.0–4.7)

## 2021-04-28 LAB — URIC ACID: Uric Acid, Serum: 5.3 mg/dL (ref 3.7–8.6)

## 2021-04-28 MED ORDER — HEPARIN SOD (PORK) LOCK FLUSH 100 UNIT/ML IV SOLN
500.0000 [IU] | Freq: Once | INTRAVENOUS | Status: AC
  Administered 2021-04-28: 500 [IU] via INTRAVENOUS

## 2021-04-28 MED ORDER — SODIUM CHLORIDE 0.9% FLUSH
10.0000 mL | Freq: Once | INTRAVENOUS | Status: AC
  Administered 2021-04-28: 10 mL via INTRAVENOUS

## 2021-04-28 NOTE — Progress Notes (Signed)
Patient presents for labs and possible fluids. Potassium 3.0, Rebekah Pennington,PA made aware, patient states he has increased his fluid intake over the last 12 hours and feels good. Pennington, PA suggest patient is okay to go home if feeling better than yesterday. Encouraged patient to eat fruits to increase potassium levels.  Lab called for critical value of WBC 80.9, Dr. Delton Coombes made aware. Patient okay to be discharged since patient received Udenyca injection on Wednesday per Dr. Delton Coombes.  Port flushed with good blood return noted. No bruising or swelling at site. Bandaid applied and patient discharged in satisfactory condition. VVS stable with no signs or symptoms of distressed noted.

## 2021-04-28 NOTE — Progress Notes (Signed)
Patients port flushed without difficulty.  Good blood return noted with no bruising or swelling noted at site.  Stable during access and blood draw.  Patient to remain accessed for possible fluids. 

## 2021-04-28 NOTE — Patient Instructions (Signed)
[facility]  Discharge Instructions: Thank you for choosing [facility] to provide your oncology and hematology care.  If you have a lab appointment with the [facility], please come in thru the Main Entrance and check in at the main information desk.  Wear comfortable clothing and clothing appropriate for easy access to any Portacath or PICC line.   We strive to give you quality time with your provider. You may need to reschedule your appointment if you arrive late (15 or more minutes).  Arriving late affects you and other patients whose appointments are after yours.  Also, if you miss three or more appointments without notifying the office, you may be dismissed from the clinic at the provider's discretion.      For prescription refill requests, have your pharmacy contact our office and allow 72 hours for refills to be completed.    Today your labs were drawn and port was flushed. Return as scheduled.   To help prevent nausea and vomiting after your treatment, we encourage you to take your nausea medication as directed.  BELOW ARE SYMPTOMS THAT SHOULD BE REPORTED IMMEDIATELY: *FEVER GREATER THAN 100.4 F (38 C) OR HIGHER *CHILLS OR SWEATING *NAUSEA AND VOMITING THAT IS NOT CONTROLLED WITH YOUR NAUSEA MEDICATION *UNUSUAL SHORTNESS OF BREATH *UNUSUAL BRUISING OR BLEEDING *URINARY PROBLEMS (pain or burning when urinating, or frequent urination) *BOWEL PROBLEMS (unusual diarrhea, constipation, pain near the anus) TENDERNESS IN MOUTH AND THROAT WITH OR WITHOUT PRESENCE OF ULCERS (sore throat, sores in mouth, or a toothache) UNUSUAL RASH, SWELLING OR PAIN  UNUSUAL VAGINAL DISCHARGE OR ITCHING   Items with * indicate a potential emergency and should be followed up as soon as possible or go to the Emergency Department if any problems should occur.  Please show the CHEMOTHERAPY ALERT CARD or IMMUNOTHERAPY ALERT CARD at check-in to the Emergency Department and triage  nurse.  Should you have questions after your visit or need to cancel or reschedule your appointment, please contact [facility] [phone]  and follow the prompts.  Office hours are 8:00 a.m. to 4:30 p.m. Monday - Friday. Please note that voicemails left after 4:00 p.m. may not be returned until the following business day.  We are closed weekends and major holidays. You have access to a nurse at all times for urgent questions. Please call the main number to the clinic [phone] and follow the prompts.  For any non-urgent questions, you may also contact your provider using MyChart. We now offer e-Visits for anyone 59 and older to request care online for non-urgent symptoms. For details visit [email or web address].   Also download the MyChart app! Go to the app store, search "MyChart", open the app, select [facility], and log in with your MyChart username and password.  Due to Covid, a mask is required upon entering the hospital/clinic. If you do not have a mask, one will be given to you upon arrival. For doctor visits, patients may have 1 support person aged 59 or older with them. For treatment visits, patients cannot have anyone with them due to current Covid guidelines and our immunocompromised population.

## 2021-04-28 NOTE — Progress Notes (Signed)
CRITICAL VALUE ALERT Critical value received:  WBC 80.9 Date of notification:  04/28/21   Time of notification: 10:40 am  Critical value read back:  Yes.   Nurse who received alert:  B Chief Technology Officer / Alert received from [place].  MD notified time and response:  10:48 am . Message sent to Dr. Delton Coombes and [place] PA.

## 2021-05-02 ENCOUNTER — Telehealth (HOSPITAL_COMMUNITY): Payer: Self-pay | Admitting: *Deleted

## 2021-05-02 ENCOUNTER — Other Ambulatory Visit (HOSPITAL_COMMUNITY): Payer: Self-pay | Admitting: *Deleted

## 2021-05-02 MED ORDER — LIDOCAINE (ANORECTAL) 5 % EX CREA: 1.0000 "application " | TOPICAL_CREAM | Freq: Two times a day (BID) | CUTANEOUS | 3 refills | Status: DC | PRN

## 2021-05-02 NOTE — Telephone Encounter (Signed)
Received TC from patient requesting something to numb rectal area due to severe pain.  Lidocaine cream 5% sent to [facility] per Dr Tomie China approval. Called patient back to advise this has been sent to pharmacy and to let us know if he has any issues.

## 2021-05-05 ENCOUNTER — Encounter (HOSPITAL_COMMUNITY): Payer: Self-pay | Admitting: *Deleted

## 2021-05-05 NOTE — Progress Notes (Signed)
Patient called the on call nurse line last night c/o diarrhea.  Came to office this morning, no appointment.  States he is feeling better than he has in a while this am.  Diarrhea has subsided.  Diarrhea stools were intermittent after pump was removed on Tuesday.  Advised him that this is an expected side effect of treatment and encouraged him to stay hydrated and use imodium as directed for diarrhea.  Wanted to know if treatment is working and how he would know if it is effective.  Advised that once he finishes his treatment, he ill have scans to assess the efficacy and Dr Delton Coombes would make a plan according to the findings.  Verbalized understanding.

## 2021-05-06 NOTE — Progress Notes (Signed)
[place], [place] [address]   CLINIC:  Medical Oncology/Hematology  PCP:  [facility] / [place] [address] [phone]   REASON FOR VISIT:  Follow-up for colon cancer metastasized to liver  PRIOR THERAPY: none  NGS Results: not done  CURRENT THERAPY: FOLFOX every 2 weeks  BRIEF ONCOLOGIC HISTORY:  Oncology History Overview Note  MMR normal Foundation One: low mutation burden   Metastasis to liver ([place])  01/04/2021 Initial Diagnosis   Metastasis to liver ([place])   01/25/2021 -  Chemotherapy   Patient is on Treatment Plan : COLORECTAL FOLFOX q14d     Colon cancer metastasized to liver ([place])  12/26/2020 Imaging   Ct abdomen and pelvis 1. Extensive hepatic metastatic disease and retroperitoneal adenopathy. 2. A 7 mm right lung base nodule, most consistent with metastatic disease. 3. No bowel obstruction. Normal appendix. 4. Aortic Atherosclerosis (ICD10-I70.0).   12/26/2020 Imaging   CT chest 1. Several small lung nodules consistent with metastatic disease. The primary malignancy remains unclear. The liver morphology suggests underlying cirrhosis, and multifocal hepatocellular carcinoma should be included in the differential diagnosis. 2. No acute abnormality in the chest.     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 LIVER, NEEDLE CORE BIOPSY:  -  Metastatic adenocarcinoma  -  See comment   COMMENT:   The neoplastic cells are positive for cytokeratin 20 and CDX2 but negative for cytokeratin 7, TTF-1, PSA and prostein.  The combined morphology and immunophenotype are consistent with metastasis from a  gastrointestinal primary.  Dr. Saralyn Pilar reviewed the case and agrees with the above diagnosis.     01/13/2021 Procedure   Colonoscopy - Two 3 to 5 mm polyps in the transverse colon, removed with a cold snare. Resected and  retrieved. - Malignant partially obstructing tumor at 17 cm proximal to the anus. Biopsied. Tattooed. - The distal rectum and anal verge are normal on retroflexion view.   01/13/2021 Cancer Staging   Staging form: Colon and Rectum, AJCC 8th Edition - Clinical stage from 01/13/2021: Stage IVB (cTX, cN0, pM1b) - Signed by Heath Lark, MD on 01/13/2021 Stage prefix: Initial diagnosis Total positive nodes: 0    01/13/2021 Procedure   Colonoscopy - Two 3 to 5 mm polyps in the transverse colon, removed with a cold snare. Resected and retrieved. - Malignant partially obstructing tumor at 17 cm proximal to the anus. Biopsied. Tattooed. - The distal rectum and anal verge are normal on retroflexion view.   01/13/2021 Procedure   EGD - Normal esophagus. - Z-line regular, 40 cm from the incisors. - Normal stomach. - Nodular mucosa in   01/13/2021 Pathology Results   A. DUODENUM, NODULE, BIOPSY:  - Peptic duodenitis.  - No dysplasia or malignancy.   B. COLON, SIGMOID, MASS, BIOPSY:  - Invasive adenocarcinoma.   C. COLON, TRANSVERSE, POLYPECTOMY:  - Fragment of ulcerated tissue with adenocarcinoma, see comment.  - Tubular adenoma (X2 fragments).   COMMENT:   B. MMR will be ordered   01/13/2021 Tumor Marker   Patient's tumor was tested for the following markers: CEA. Results of the tumor marker test revealed 883.   01/24/2021 Procedure   Successful right-sided port placement, with the tip of the catheter in the proximal right atrium.   Plan: Catheter ready for use.   See detailed procedure note with images in PACS. The patient tolerated  the procedure well without incident or complication and was returned to Recovery in stable conditi   01/25/2021 -  Chemotherapy   Patient is on Treatment Plan : COLORECTAL FOLFOX q14d     01/25/2021 Tumor Marker   Patient's tumor was tested for the following markers: CEA. Results of the tumor marker test revealed 1378.     CANCER STAGING: Cancer  Staging Colon cancer metastasized to liver [facility]) Staging form: Colon and Rectum, AJCC 8th Edition - Clinical stage from 01/13/2021: Stage IVB (cTX, cN0, pM1b) - Signed by Heath Lark, MD on 01/13/2021   INTERVAL HISTORY:  [name], a 60 y.o. male, returns for routine follow-up and consideration for next cycle of chemotherapy. Rondell was last seen on 04/24/2021.  Due for cycle #8 of FOLFOX today.   Overall, he tells me he has been feeling pretty well. He reports fatigue following treatment which has improved over the past 2 days. He reports painful chapping at the corners of his mouth which is worsened with cold liquids. He reports itching rash on his tailbone. He reports one night of severe diarrhea which is currently controlled with Imodium. He reports constant tingling in his fingertips. He reports dryness in his nose along with nosebleeds.   Overall, he feels ready for next cycle of chemo today.   REVIEW OF SYSTEMS:  Review of Systems  Constitutional:  Positive for fatigue (45%\). Negative for appetite change (75%).  HENT:   Positive for nosebleeds.   Gastrointestinal:  Positive for diarrhea and nausea.  Skin:  Positive for itching and rash (buttock).  Neurological:  Positive for dizziness and numbness.  All other systems reviewed and are negative.  PAST MEDICAL/SURGICAL HISTORY:  Past Medical History:  Diagnosis Date   Diabetes mellitus without complication ([place])    Hypertension    Past Surgical History:  Procedure Laterality Date   BIOPSY  01/13/2021   Procedure: BIOPSY;  Surgeon: Harvel Quale, MD;  Location[facility];  Service: Gastroenterology;;   CATARACT EXTRACTION Right    COLONOSCOPY WITH PROPOFOL N/A 01/13/2021   Procedure: COLONOSCOPY WITH PROPOFOL;  Surgeon: Harvel Quale, MD;  Location: [facility];  Service: Gastroenterology;  Laterality: N/A;  11:05   ESOPHAGOGASTRODUODENOSCOPY (EGD) WITH PROPOFOL N/A 01/13/2021   Procedure:  ESOPHAGOGASTRODUODENOSCOPY (EGD) WITH PROPOFOL;  Surgeon: Harvel Quale, MD;  Location: [facility];  Service: Gastroenterology;  Laterality: N/A;   IR IMAGING GUIDED PORT INSERTION  01/24/2021   LIVER BIOPSY  01/10/2021   U/S guided   POLYPECTOMY  01/13/2021   Procedure: POLYPECTOMY;  Surgeon: Harvel Quale, MD;  Location: [facility];  Service: Gastroenterology;;    SOCIAL HISTORY:  Social History   Socioeconomic History   Marital status: Married    Spouse name: Not on file   Number of children: Not on file   Years of education: Not on file   Highest education level: Not on file  Occupational History   Not on file  Tobacco Use   Smoking status: Never   Smokeless tobacco: Never  Vaping Use   Vaping Use: Never used  Substance and Sexual Activity   Alcohol use: Not Currently   Drug use: Not Currently   Sexual activity: Not Currently  Other Topics Concern   Not on file  Social History Narrative   Married, no childer   Social Determinants of Health   Financial Resource Strain: Not on file  Food Insecurity: Not on file  Transportation Needs: No  Transportation Needs   Lack of Transportation (Medical): No   Lack of Transportation (Non-Medical): No  Physical Activity: Inactive   Days of Exercise per Week: 0 days   Minutes of Exercise per Session: 0 min  Stress: Not on file  Social Connections: Not on file  Intimate Partner Violence: Not At Risk   Fear of Current or Ex-Partner: No   Emotionally Abused: No   Physically Abused: No   Sexually Abused: No    FAMILY HISTORY:  Family History  Problem Relation Age of Onset   Cancer Father 21       prostate ca    CURRENT MEDICATIONS:  Current Outpatient Medications  Medication Sig Dispense Refill   acetaminophen (TYLENOL) 500 MG tablet Take 1,000 mg by mouth every 6 (six) hours as needed for moderate pain.     atenolol (TENORMIN) 50 MG tablet Take 50 mg by mouth daily.     clobetasol cream  (TEMOVATE) 7.98 % Apply 1 application topically 2 (two) times daily. Apply to both hands twice a day 30 g 0   clobetasol ointment (TEMOVATE) 0.05 % Apply twice a day on affected areas for 2 weeks, then once a day for two weeks, after that use every other day     doxycycline (VIBRA-TABS) 100 MG tablet Take 1 tablet (100 mg total) by mouth 2 (two) times daily. 60 tablet 6   fluorouracil CALGB 92119 2,400 mg/m2 in sodium chloride 0.9 % 150 mL Inject 2,400 mg/m2 into the vein over 48 hr.     hydrocortisone 1 % ointment Apply 1 application topically 2 (two) times daily. 60 g 6   hydrocortisone-pramoxine (PROCTOFOAM HC) rectal foam Place 1 applicator rectally 2 (two) times daily. 10 g 6   insulin NPH-regular Human (70-30) 100 UNIT/ML injection Inject 10-12 Units into the skin 2 (two) times daily.     Lactulose 20 GM/30ML SOLN Take 30 mLs (20 g total) by mouth in the morning. 450 mL 0   LEUCOVORIN CALCIUM IV Inject 400 mg/m2 into the vein every 14 (fourteen) days.     lidocaine (XYLOCAINE) 5 % ointment Apply 1 application topically in the morning and at bedtime. Apply to both hands twice a day 35.44 g 0   Lidocaine, Anorectal, 5 % CREA Apply 1 application topically 2 (two) times daily as needed. 30 g 3   lidocaine-prilocaine (EMLA) cream Apply to affected area once (Patient not taking: No sig reported) 30 g 3   LORazepam (ATIVAN) 0.5 MG tablet Take 1 tablet by mouth twice daily as needed for anxiety 60 tablet 3   morphine (MSIR) 15 MG tablet Take 1 tablet (15 mg total) by mouth 2 (two) times daily as needed for severe pain. 60 tablet 0   Multiple Vitamin (MULTIVITAMIN WITH MINERALS) TABS tablet Take 1 tablet by mouth daily.     omeprazole (PRILOSEC) 40 MG capsule Take 1 capsule (40 mg total) by mouth daily. 90 capsule 3   ondansetron (ZOFRAN) 8 MG tablet Take 1 tablet (8 mg total) by mouth every 8 (eight) hours as needed for nausea. 60 tablet 3   OXALIPLATIN IV Inject 85 mg/m2 into the vein every 14  (fourteen) days.     pentoxifylline (TRENTAL) 400 MG CR tablet Take by mouth.     polyethylene glycol (MIRALAX) 17 g packet Take 17 g by mouth daily. 30 each 3   prochlorperazine (COMPAZINE) 10 MG tablet Take 1 tablet by mouth every 6 (six) hours as needed.  simvastatin (ZOCOR) 20 MG tablet SMARTSIG:1 Tablet(s) By Mouth Every Evening     sucralfate (CARAFATE) 1 GM/10ML suspension SMARTSIG:Milliliter(s) By Mouth     temazepam (RESTORIL) 15 MG capsule TAKE 1 CAPSULE BY MOUTH AT BEDTIME AS NEEDED FOR SLEEP 30 capsule 0   No current facility-administered medications for this visit.    ALLERGIES:  No Known Allergies  PHYSICAL EXAM:  Performance status (ECOG): 1 - Symptomatic but completely ambulatory  There were no vitals filed for this visit. Wt Readings from Last 3 Encounters:  04/24/21 169 lb 3.2 oz (76.7 kg)  04/10/21 168 lb 12.8 oz (76.6 kg)  03/22/21 164 lb 14.5 oz (74.8 kg)   Physical Exam Vitals reviewed.  Constitutional:      Appearance: Normal appearance.  Cardiovascular:     Rate and Rhythm: Normal rate and regular rhythm.     Pulses: Normal pulses.     Heart sounds: Normal heart sounds.  Pulmonary:     Effort: Pulmonary effort is normal.     Breath sounds: Normal breath sounds.  Neurological:     General: No focal deficit present.     Mental Status: He is alert and oriented to person, place, and time.  Psychiatric:        Mood and Affect: Mood normal.        Behavior: Behavior normal.    LABORATORY DATA:  I have reviewed the labs as listed.  CBC Latest Ref Rng & Units 04/28/2021 04/24/2021 04/10/2021  WBC 4.0 - 10.5 K/uL 80.9(HH) 17.2(H) 4.6  Hemoglobin 13.0 - 17.0 g/dL 11.1(L) 11.6(L) 11.6(L)  Hematocrit 39.0 - 52.0 % 31.8(L) 35.0(L) 33.7(L)  Platelets 150 - 400 K/uL 131(L) 110(L) 215   CMP Latest Ref Rng & Units 04/28/2021 04/27/2021 04/24/2021  Glucose 70 - 99 mg/dL 118(H) 83 108(H)  BUN 6 - 20 mg/dL $Remove'14 13 10  'LDvibjD$ Creatinine 0.61 - 1.24 mg/dL 0.56(L) 0.71  0.64  Sodium 135 - 145 mmol/L 138 137 136  Potassium 3.5 - 5.1 mmol/L 3.0(L) 5.9(H) 3.9  Chloride 98 - 111 mmol/L 103 103 104  CO2 22 - 32 mmol/L $RemoveB'25 23 27  'DSHtlugJ$ Calcium 8.9 - 10.3 mg/dL 8.1(L) 8.6(L) 8.4(L)  Total Protein 6.5 - 8.1 g/dL 5.4(L) 5.8(L) 5.7(L)  Total Bilirubin 0.3 - 1.2 mg/dL 1.0 1.8(H) 0.6  Alkaline Phos 38 - 126 U/L 530(H) 481(H) 382(H)  AST 15 - 41 U/L 65(H) 86(H) 57(H)  ALT 0 - 44 U/L $Remo'26 29 27    'fQIhz$ DIAGNOSTIC IMAGING:  I have independently reviewed the scans and discussed with the patient. CT CHEST ABDOMEN PELVIS W CONTRAST  Result Date: 04/21/2021 CLINICAL DATA:  Metastatic colon cancer, assess treatment response, ongoing chemotherapy EXAM: CT CHEST, ABDOMEN, AND PELVIS WITH CONTRAST TECHNIQUE: Multidetector CT imaging of the chest, abdomen and pelvis was performed following the standard protocol during bolus administration of intravenous contrast. CONTRAST:  63mL OMNIPAQUE IOHEXOL 350 MG/ML SOLN, additional oral enteric contrast COMPARISON:  12/26/2020 FINDINGS: CT CHEST FINDINGS Cardiovascular: Right chest port catheter. Normal heart size. No pericardial effusion. Mediastinum/Nodes: No enlarged mediastinal, hilar, or axillary lymph nodes. Thyroid gland, trachea, and esophagus demonstrate no significant findings. Lungs/Pleura: Occasional small bilateral pulmonary nodules are diminished in size, for example a 0.4 cm nodule of the lateral segment right middle lobe, previously 0.6 cm (series 4, image 71), a 0.3 cm nodule of the anterior left upper lobe, previously 0.5 cm (series 4, image 38), and a 0.4 cm nodule of the peripheral left lower lobe, previously 0.7 cm (  series 4 image 94). No pleural effusion or pneumothorax. Musculoskeletal: No chest wall mass. CT ABDOMEN PELVIS FINDINGS Hepatobiliary: Numerous low-attenuation lesions are again seen throughout the liver parenchyma, slightly decreased in size, an index lesion of the anterior left lobe measuring 3.1 x 2.1 cm, previously 4.4 x  3.2 cm when measured similarly (series 2, image 56), and a lesion of the anterior liver dome measuring 1.8 x 1.4 cm, previously 2.8 x 2.2 cm (series 2, image 49). Coarse, nodular, cirrhotic contour of the liver. No gallstones, gallbladder wall thickening, or biliary dilatation. Pancreas: Unremarkable. No pancreatic ductal dilatation or surrounding inflammatory changes. Spleen: Unchanged splenomegaly, maximum span 15.0 cm. Adrenals/Urinary Tract: Adrenal glands are unremarkable. Kidneys are normal, without renal calculi, solid lesion, or hydronephrosis. Bladder is unremarkable. Stomach/Bowel: Stomach is within normal limits. Appendix appears normal. No evidence of bowel wall thickening, distention, or inflammatory changes. Vascular/Lymphatic: Aortic atherosclerosis. Numerous enlarged retroperitoneal lymph nodes are diminished in size, largest aortocaval node measuring 1.9 x 1.3 cm, previously 2.8 x 1.6 cm (series 2, image 63). Previously enlarged lymph nodes in the distal sigmoid mesocolon are no longer clearly appreciated. Reproductive: No mass or other abnormality. Other: No abdominal wall hernia or abnormality. There is new small volume ascites throughout the abdomen and pelvis. Musculoskeletal: There is a new, subtle, rim sclerotic lesion of the right aspect of the L2 vertebral body (series 2, image 71, series 6, image 106). IMPRESSION: 1. Interval decrease in size of multiple pulmonary nodules. 2. Interval decrease in size of numerous liver lesions. 3. Interval decrease in size numerous enlarged retroperitoneal lymph nodes. 4. Previously enlarged lymph nodes in the distal sigmoid mesocolon are no longer clearly appreciated. 5. Constellation of findings is consistent with treatment response of metastatic disease. 6. New, subtle, rim sclerotic lesion of the right aspect of the L2 vertebral body, consistent with post treatment sclerosis of an osseous metastatic lesion. 7. New small volume ascites throughout the  abdomen and pelvis. There is no appreciable peritoneal nodularity or direct evidence of peritoneal metastatic disease. 8. Primary colon malignancy is not clearly appreciated; suspect however that it is located near the rectosigmoid junction given the presence of previously enlarged adjacent mesocolonic lymph nodes and a caliber change of the bowel in this vicinity. Aortic Atherosclerosis (ICD10-I70.0). Electronically Signed   By: Delanna Ahmadi M.D.   On: 04/21/2021 11:46     ASSESSMENT:  1.  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 - CT AP with contrast on 12/26/2020 showed extensive hepatic metastatic disease and retroperitoneal adenopathy.  CT chest without contrast on 12/26/2020 showed several small subcentimeter lung nodules consistent with metastatic disease.  Liver morphology suggests underlying cirrhosis. - Liver biopsy on 01/10/2021 consistent with metastatic adenocarcinoma, CK20 and CDX2 positive, negative for CK7, TTF-1, PSA and prostein. - Colonoscopy and biopsy of the sigmoid mass on 01/13/2021 consistent with adenocarcinoma. - 10 pound weight loss prior to start of therapy. - FOLFOX started on 01/25/2021. - Foundation 1 testing on 01/10/2021 showed MSI-stable, TMB low, negative for RAS/BRAF/HER2 mutation.  2.  Social/family history: - Lives at home with his wife.  He drove truck and operated equipment.  No smoking history. - Father had prostate cancer   PLAN:  1.  Metastatic sigmoid colon cancer to liver and lungs: - CT CAP on 04/19/2021 showed interval decrease in size of multiple lung nodules and decrease in size of numerous liver lesions. - CEA improved to 56.6 on 04/27/2021.  This was approximately 1400 prior to start of therapy. - He reported generalized fatigue after last cycle.  He had diarrhea for 1 night. - He also reported constant numbness in the fingertips. - Reviewed labs from today which showed  elevated AST of 52 and alk phos 382.  Bilirubin was normal.  CBC was grossly normal. - We will decrease his 5-FU infusion dose by 20%.  We will see if this helps with the fatigue. - We will also consider discontinuing oxaliplatin after cycle 10. - RTC 3 weeks for follow-up with labs and treatment.  2.  Coccygeal pain: - Continue half tablet of MS IR at bedtime.  3.  Diabetes: - Continue NPH 70/30.  4.  Difficulty falling/staying asleep: - Continue Restoril 15 mg at bedtime.  5.  Constipation: - Continue lactulose daily.  Continue stool softener.  6.  Erythematous maculopapular rash: - This rash on the legs have improved with the clobetasol cream.  7.  EGFR antibody induced rash: - Continued doxycycline twice daily.  Continue clobetasol cream prescribed by dermatology.  8.  Anxiety: - Continue Ativan 0.5 mg as needed.  9.  Electrolyte abnormalities: - He has hypokalemia with potassium 2.8 and hypomagnesemia with 1.5. - We have repleted in the office. - Start potassium 20 mEq daily at home.  Start magnesium 400 mg twice daily at home.   Orders placed this encounter:  No orders of the defined types were placed in this encounter.    Derek Jack, MD [facility] [phone]   I, Thana Ates, am acting as a scribe for Dr. Derek Jack.  I, Derek Jack MD, have reviewed the above documentation for accuracy and completeness, and I agree with the above.

## 2021-05-08 ENCOUNTER — Inpatient Hospital Stay (HOSPITAL_COMMUNITY): Payer: Medicaid Other

## 2021-05-08 ENCOUNTER — Inpatient Hospital Stay (HOSPITAL_BASED_OUTPATIENT_CLINIC_OR_DEPARTMENT_OTHER): Payer: Medicaid Other | Admitting: Hematology

## 2021-05-08 ENCOUNTER — Other Ambulatory Visit: Payer: Self-pay

## 2021-05-08 VITALS — BP 93/64 | HR 86 | Temp 98.1°F | Resp 18

## 2021-05-08 VITALS — BP 94/78 | HR 105 | Temp 96.2°F | Resp 18 | Wt 164.2 lb

## 2021-05-08 DIAGNOSIS — I959 Hypotension, unspecified: Secondary | ICD-10-CM | POA: Diagnosis not present

## 2021-05-08 DIAGNOSIS — E875 Hyperkalemia: Secondary | ICD-10-CM

## 2021-05-08 DIAGNOSIS — C787 Secondary malignant neoplasm of liver and intrahepatic bile duct: Secondary | ICD-10-CM

## 2021-05-08 DIAGNOSIS — C189 Malignant neoplasm of colon, unspecified: Secondary | ICD-10-CM

## 2021-05-08 DIAGNOSIS — Z5112 Encounter for antineoplastic immunotherapy: Secondary | ICD-10-CM | POA: Diagnosis not present

## 2021-05-08 LAB — COMPREHENSIVE METABOLIC PANEL
ALT: 26 U/L (ref 0–44)
AST: 52 U/L — ABNORMAL HIGH (ref 15–41)
Albumin: 2.9 g/dL — ABNORMAL LOW (ref 3.5–5.0)
Alkaline Phosphatase: 382 U/L — ABNORMAL HIGH (ref 38–126)
Anion gap: 8 (ref 5–15)
BUN: 6 mg/dL (ref 6–20)
CO2: 26 mmol/L (ref 22–32)
Calcium: 8.1 mg/dL — ABNORMAL LOW (ref 8.9–10.3)
Chloride: 104 mmol/L (ref 98–111)
Creatinine, Ser: 0.6 mg/dL — ABNORMAL LOW (ref 0.61–1.24)
GFR, Estimated: >60 mL/min (ref >60)
Glucose, Bld: 126 mg/dL — ABNORMAL HIGH (ref 70–99)
Potassium: 2.8 mmol/L — ABNORMAL LOW (ref 3.5–5.1)
Sodium: 138 mmol/L (ref 135–145)
Total Bilirubin: 0.9 mg/dL (ref 0.3–1.2)
Total Protein: 5.4 g/dL — ABNORMAL LOW (ref 6.5–8.1)

## 2021-05-08 LAB — CBC WITH DIFFERENTIAL/PLATELET
Abs Immature Granulocytes: 0.57 10*3/uL — ABNORMAL HIGH (ref 0.00–0.07)
Basophils Absolute: 0.1 10*3/uL (ref 0.0–0.1)
Basophils Relative: 1 %
Eosinophils Absolute: 0.2 10*3/uL (ref 0.0–0.5)
Eosinophils Relative: 2 %
HCT: 31.6 % — ABNORMAL LOW (ref 39.0–52.0)
Hemoglobin: 10.9 g/dL — ABNORMAL LOW (ref 13.0–17.0)
Immature Granulocytes: 5 %
Lymphocytes Relative: 20 %
Lymphs Abs: 2.4 10*3/uL (ref 0.7–4.0)
MCH: 31.7 pg (ref 26.0–34.0)
MCHC: 34.5 g/dL (ref 30.0–36.0)
MCV: 91.9 fL (ref 80.0–100.0)
Monocytes Absolute: 1.1 10*3/uL — ABNORMAL HIGH (ref 0.1–1.0)
Monocytes Relative: 9 %
Neutro Abs: 7.8 10*3/uL — ABNORMAL HIGH (ref 1.7–7.7)
Neutrophils Relative %: 63 %
Platelets: 143 10*3/uL — ABNORMAL LOW (ref 150–400)
RBC: 3.44 MIL/uL — ABNORMAL LOW (ref 4.22–5.81)
RDW: 18.7 % — ABNORMAL HIGH (ref 11.5–15.5)
WBC: 12.2 10*3/uL — ABNORMAL HIGH (ref 4.0–10.5)
nRBC: 0.3 % — ABNORMAL HIGH (ref 0.0–0.2)

## 2021-05-08 LAB — MAGNESIUM: Magnesium: 1.5 mg/dL — ABNORMAL LOW (ref 1.7–2.4)

## 2021-05-08 MED ORDER — DEXTROSE 5 % IV SOLN
400.0000 mg/m2 | Freq: Once | INTRAVENOUS | Status: AC
  Administered 2021-05-08: 784 mg via INTRAVENOUS
  Filled 2021-05-08: qty 39.2

## 2021-05-08 MED ORDER — PALONOSETRON HCL INJECTION 0.25 MG/5ML
0.2500 mg | Freq: Once | INTRAVENOUS | Status: AC
  Administered 2021-05-08: 0.25 mg via INTRAVENOUS
  Filled 2021-05-08: qty 5

## 2021-05-08 MED ORDER — MAGNESIUM OXIDE -MG SUPPLEMENT 400 (240 MG) MG PO TABS: 400.0000 mg | ORAL_TABLET | Freq: Two times a day (BID) | ORAL | 3 refills | Status: DC

## 2021-05-08 MED ORDER — DEXTROSE 5 % IV SOLN: Freq: Once | INTRAVENOUS | Status: AC

## 2021-05-08 MED ORDER — SODIUM CHLORIDE 0.9 % IV SOLN
10.0000 mg | Freq: Once | INTRAVENOUS | Status: AC
  Administered 2021-05-08: 10 mg via INTRAVENOUS
  Filled 2021-05-08: qty 10

## 2021-05-08 MED ORDER — SODIUM CHLORIDE 0.9 % IV SOLN: INTRAVENOUS | Status: DC

## 2021-05-08 MED ORDER — POTASSIUM CHLORIDE 20 MEQ PO PACK: 20.0000 meq | PACK | Freq: Every day | ORAL | 3 refills | Status: DC

## 2021-05-08 MED ORDER — OXALIPLATIN CHEMO INJECTION 100 MG/20ML
68.0000 mg/m2 | Freq: Once | INTRAVENOUS | Status: AC
  Administered 2021-05-08: 135 mg via INTRAVENOUS
  Filled 2021-05-08: qty 20

## 2021-05-08 MED ORDER — SODIUM CHLORIDE 0.9 % IV SOLN
1920.0000 mg/m2 | INTRAVENOUS | Status: DC
  Administered 2021-05-08: 3750 mg via INTRAVENOUS
  Filled 2021-05-08: qty 75

## 2021-05-08 MED ORDER — SODIUM CHLORIDE 0.9 % IV SOLN
4.8000 mg/kg | Freq: Once | INTRAVENOUS | Status: AC
  Administered 2021-05-08: 360 mg via INTRAVENOUS
  Filled 2021-05-08: qty 18

## 2021-05-08 MED ORDER — MAGNESIUM SULFATE 2 GM/50ML IV SOLN
2.0000 g | Freq: Once | INTRAVENOUS | Status: AC
  Administered 2021-05-08: 2 g via INTRAVENOUS
  Filled 2021-05-08: qty 50

## 2021-05-08 MED ORDER — POTASSIUM CHLORIDE CRYS ER 20 MEQ PO TBCR
40.0000 meq | EXTENDED_RELEASE_TABLET | ORAL | Status: AC
  Administered 2021-05-08 (×2): 40 meq via ORAL
  Filled 2021-05-08 (×2): qty 2

## 2021-05-08 NOTE — Progress Notes (Signed)
Patient has been examined, vital signs and labs have been reviewed by Dr. Katragadda. ANC, Creatinine, LFTs, hemoglobin, and platelets are within treatment parameters per Dr. Katragadda. Patient may proceed with treatment per M.D.   

## 2021-05-08 NOTE — Patient Instructions (Addendum)
[facility]  Discharge Instructions: Thank you for choosing [facility] to provide your oncology and hematology care.  If you have a lab appointment with the [facility], please come in thru the Main Entrance and check in at the main information desk.  Wear comfortable clothing and clothing appropriate for easy access to any Portacath or PICC line.   We strive to give you quality time with your provider. You may need to reschedule your appointment if you arrive late (15 or more minutes).  Arriving late affects you and other patients whose appointments are after yours.  Also, if you miss three or more appointments without notifying the office, you may be dismissed from the clinic at the provider's discretion.      For prescription refill requests, have your pharmacy contact our office and allow 72 hours for refills to be completed.    Today you received the following chemotherapy and/or immunotherapy agents FOLFOX and Vectibix.   To help prevent nausea and vomiting after your treatment, we encourage you to take your nausea medication as directed.  The chemotherapy medication bag should finish at 46 hours, 96 hours, or 7 days. For example, if your pump is scheduled for 46 hours and it was put on at 4:00 p.m., it should finish at 2:00 p.m. the day it is scheduled to come off regardless of your appointment time.     Estimated time to finish at 1250.   If the display on your pump reads "Low Volume" and it is beeping, take the batteries out of the pump and come to the cancer center for it to be taken off.   If the pump alarms go off prior to the pump reading "Low Volume" then call [phone] and someone can assist you.  If the plunger comes out and the chemotherapy medication is leaking out, please use your home chemo spill kit to clean up the spill. Do NOT use paper towels or other household products.  If you have problems or questions regarding your pump, please  call either [phone] (24 hours a day) or the cancer center Monday-Friday 8:00 a.m.- 4:30 p.m. at the clinic number and we will assist you. If you are unable to get assistance, then go to the nearest Emergency Department and ask the staff to contact the IV team for assistance.    BELOW ARE SYMPTOMS THAT SHOULD BE REPORTED IMMEDIATELY: *FEVER GREATER THAN 100.4 F (38 C) OR HIGHER *CHILLS OR SWEATING *NAUSEA AND VOMITING THAT IS NOT CONTROLLED WITH YOUR NAUSEA MEDICATION *UNUSUAL SHORTNESS OF BREATH *UNUSUAL BRUISING OR BLEEDING *URINARY PROBLEMS (pain or burning when urinating, or frequent urination) *BOWEL PROBLEMS (unusual diarrhea, constipation, pain near the anus) TENDERNESS IN MOUTH AND THROAT WITH OR WITHOUT PRESENCE OF ULCERS (sore throat, sores in mouth, or a toothache) UNUSUAL RASH, SWELLING OR PAIN  UNUSUAL VAGINAL DISCHARGE OR ITCHING   Items with * indicate a potential emergency and should be followed up as soon as possible or go to the Emergency Department if any problems should occur.  Please show the CHEMOTHERAPY ALERT CARD or IMMUNOTHERAPY ALERT CARD at check-in to the Emergency Department and triage nurse.  Should you have questions after your visit or need to cancel or reschedule your appointment, please contact [facility] [phone]  and follow the prompts.  Office hours are 8:00 a.m. to 4:30 p.m. Monday - Friday. Please note that voicemails left after 4:00 p.m. may not be returned until the following business day.  We are closed weekends and major holidays. You have access to a nurse at all times for urgent questions. Please call the main number to the clinic [phone] and follow the prompts.  For any non-urgent questions, you may also contact your provider using MyChart. We now offer e-Visits for anyone 26 and older to request care online for non-urgent symptoms. For details visit [email or web address].   Also download the MyChart app! Go to the app  store, search "MyChart", open the app, select [facility], and log in with your MyChart username and password.  Due to Covid, a mask is required upon entering the hospital/clinic. If you do not have a mask, one will be given to you upon arrival. For doctor visits, patients may have 1 support person aged 96 or older with them. For treatment visits, patients cannot have anyone with them due to current Covid guidelines and our immunocompromised population. [facility] Johns  Discharge Instructions: Thank you for choosing [facility] to provide your oncology and hematology care.  If you have a lab appointment with the [facility], please come in thru the Main Entrance and check in at the main information desk.  Wear comfortable clothing and clothing appropriate for easy access to any Portacath or PICC line.   We strive to give you quality time with your provider. You may need to reschedule your appointment if you arrive late (15 or more minutes).  Arriving late affects you and other patients whose appointments are after yours.  Also, if you miss three or more appointments without notifying the office, you may be dismissed from the clinic at the provider's discretion.      For prescription refill requests, have your pharmacy contact our office and allow 72 hours for refills to be completed.

## 2021-05-08 NOTE — Patient Instructions (Signed)
[place] at [facility] Discharge Instructions  You were seen and examined today by Dr. Delton Coombes. You will receive your eighth cycle of treatment today. Start using saline nasal spray four times daily to help with your dry nose. Return as scheduled in 2 weeks for lab work, office visit, and treatment.   Thank you for choosing [place] at [facility] to provide your oncology and hematology care.  To afford each patient quality time with our provider, please arrive at least 15 minutes before your scheduled appointment time.   If you have a lab appointment with the [facility] please come in thru the Main Entrance and check in at the main information desk.  You need to re-schedule your appointment should you arrive 10 or more minutes late.  We strive to give you quality time with our providers, and arriving late affects you and other patients whose appointments are after yours.  Also, if you no show three or more times for appointments you may be dismissed from the clinic at the providers discretion.     Again, thank you for choosing [facility].  Our hope is that these requests will decrease the amount of time that you wait before being seen by our physicians.       _____________________________________________________________  Should you have questions after your visit to [facility], please contact our office at [phone] and follow the prompts.  Our office hours are 8:00 a.m. and 4:30 p.m. Monday - Friday.  Please note that voicemails left after 4:00 p.m. may not be returned until the following business day.  We are closed weekends and major holidays.  You do have access to a nurse 24-7, just call the main number to the clinic [phone] and do not press any options, hold on the line and a nurse will answer the phone.    For prescription refill requests, have your pharmacy contact our office and allow 72 hours.     Due to Covid, you will need to wear a mask upon entering the hospital. If you do not have a mask, a mask will be given to you at the Main Entrance upon arrival. For doctor visits, patients may have 1 support person age 78 or older with them. For treatment visits, patients can not have anyone with them due to social distancing guidelines and our immunocompromised population.

## 2021-05-08 NOTE — Progress Notes (Signed)
Pt presents today for chemotherapy treatment today. Vital signs stable and other labs WNL for treatment today. Mg 1.5 and Potassium 2.8 He needs K-dur 40 mEq po x 2 doses and 2g mag IV per Dr.K. Pt's  dose reduced 20fu by 20%  per Dr.K. He is good to proceed with treatment today per Dr Raliegh Ip   FOLFOX /Vectibix and 2g Magnesium sulfate IV and 5mEq of potassium chloride p.o x 2 doses given today per MD orders. Tolerated infusion without adverse affects. Vital signs stable. No complaints at this time. Discharged from clinic ambulatory in stable condition. Alert and oriented x 3. F/U with [facility] as scheduled.  5FU ambulatory pump infusing.

## 2021-05-10 ENCOUNTER — Inpatient Hospital Stay (HOSPITAL_COMMUNITY): Payer: Medicaid Other | Attending: Hematology

## 2021-05-10 ENCOUNTER — Other Ambulatory Visit: Payer: Self-pay

## 2021-05-10 VITALS — BP 90/59 | HR 99 | Temp 96.4°F | Resp 18

## 2021-05-10 DIAGNOSIS — C7800 Secondary malignant neoplasm of unspecified lung: Secondary | ICD-10-CM | POA: Diagnosis not present

## 2021-05-10 DIAGNOSIS — E876 Hypokalemia: Secondary | ICD-10-CM | POA: Diagnosis not present

## 2021-05-10 DIAGNOSIS — C189 Malignant neoplasm of colon, unspecified: Secondary | ICD-10-CM

## 2021-05-10 DIAGNOSIS — C187 Malignant neoplasm of sigmoid colon: Secondary | ICD-10-CM | POA: Insufficient documentation

## 2021-05-10 DIAGNOSIS — C787 Secondary malignant neoplasm of liver and intrahepatic bile duct: Secondary | ICD-10-CM | POA: Insufficient documentation

## 2021-05-10 DIAGNOSIS — Z5111 Encounter for antineoplastic chemotherapy: Secondary | ICD-10-CM | POA: Insufficient documentation

## 2021-05-10 MED ORDER — POTASSIUM CHLORIDE ER 20 MEQ PO TBCR: 20.0000 meq | EXTENDED_RELEASE_TABLET | Freq: Every day | ORAL | 3 refills | Status: DC

## 2021-05-10 MED ORDER — CLINDAMYCIN PHOSPHATE 1 % EX GEL: Freq: Two times a day (BID) | CUTANEOUS | 0 refills | Status: DC

## 2021-05-10 MED ORDER — PEGFILGRASTIM-CBQV 6 MG/0.6ML ~~LOC~~ SOSY
6.0000 mg | PREFILLED_SYRINGE | Freq: Once | SUBCUTANEOUS | Status: AC
  Administered 2021-05-10: 6 mg via SUBCUTANEOUS
  Filled 2021-05-10: qty 0.6

## 2021-05-10 MED ORDER — HEPARIN SOD (PORK) LOCK FLUSH 100 UNIT/ML IV SOLN
500.0000 [IU] | Freq: Once | INTRAVENOUS | Status: AC | PRN
  Administered 2021-05-10: 500 [IU]

## 2021-05-10 MED ORDER — SODIUM CHLORIDE 0.9% FLUSH
10.0000 mL | INTRAVENOUS | Status: DC | PRN
  Administered 2021-05-10: 10 mL

## 2021-05-10 NOTE — Patient Instructions (Signed)
[place]  Discharge Instructions: Thank you for choosing [place] to provide your oncology and hematology care.  If you have a lab appointment with the [facility], please come in thru the Main Entrance and check in at the main information desk.  Wear comfortable clothing and clothing appropriate for easy access to any Portacath or PICC line.   We strive to give you quality time with your provider. You may need to reschedule your appointment if you arrive late (15 or more minutes).  Arriving late affects you and other patients whose appointments are after yours.  Also, if you miss three or more appointments without notifying the office, you may be dismissed from the clinic at the provider's discretion.      For prescription refill requests, have your pharmacy contact our office and allow 72 hours for refills to be completed.    Today you received the following chemotherapy and/or immunotherapy agents Udenyca pump DC      To help prevent nausea and vomiting after your treatment, we encourage you to take your nausea medication as directed.  BELOW ARE SYMPTOMS THAT SHOULD BE REPORTED IMMEDIATELY: *FEVER GREATER THAN 100.4 F (38 C) OR HIGHER *CHILLS OR SWEATING *NAUSEA AND VOMITING THAT IS NOT CONTROLLED WITH YOUR NAUSEA MEDICATION *UNUSUAL SHORTNESS OF BREATH *UNUSUAL BRUISING OR BLEEDING *URINARY PROBLEMS (pain or burning when urinating, or frequent urination) *BOWEL PROBLEMS (unusual diarrhea, constipation, pain near the anus) TENDERNESS IN MOUTH AND THROAT WITH OR WITHOUT PRESENCE OF ULCERS (sore throat, sores in mouth, or a toothache) UNUSUAL RASH, SWELLING OR PAIN  UNUSUAL VAGINAL DISCHARGE OR ITCHING   Items with * indicate a potential emergency and should be followed up as soon as possible or go to the Emergency Department if any problems should occur.  Please show the CHEMOTHERAPY ALERT CARD or IMMUNOTHERAPY ALERT CARD at check-in to the Emergency  Department and triage nurse.  Should you have questions after your visit or need to cancel or reschedule your appointment, please contact [facility] [phone]  and follow the prompts.  Office hours are 8:00 a.m. to 4:30 p.m. Monday - Friday. Please note that voicemails left after 4:00 p.m. may not be returned until the following business day.  We are closed weekends and major holidays. You have access to a nurse at all times for urgent questions. Please call the main number to the clinic [phone] and follow the prompts.  For any non-urgent questions, you may also contact your provider using MyChart. We now offer e-Visits for anyone 29 and older to request care online for non-urgent symptoms. For details visit [email or web address].   Also download the MyChart app! Go to the app store, search "MyChart", open the app, select [facility], and log in with your MyChart username and password.  Due to Covid, a mask is required upon entering the hospital/clinic. If you do not have a mask, one will be given to you upon arrival. For doctor visits, patients may have 1 support person aged 14 or older with them. For treatment visits, patients cannot have anyone with them due to current Covid guidelines and our immunocompromised population.

## 2021-05-10 NOTE — Progress Notes (Signed)
Patient presents today for 5FU pump stop and disconnection after 46 hour continous infusion.   5FU pump deaccessed.  Patients port flushed without difficulty.  Good blood return noted with no bruising or swelling noted at site.  needle removed intact.  Band aid applied.  Udenyca administration without incident; injection site WNL; see MAR for injection details.  Patient tolerated procedure well and without incident.  No questions or complaints noted at this time. VSS with discharge and left in satisfactory condition via wheelchair with no s/s of distress noted.

## 2021-05-12 ENCOUNTER — Encounter (HOSPITAL_COMMUNITY): Payer: Self-pay

## 2021-05-12 NOTE — Progress Notes (Signed)
Patient called reporting that his insurance denied Clindamycin gel for buttock rash. Prior Authorization pending as of 05/12/21. Patient made aware, encouraged patient to reach out to insurance for preferred alternative should he wish to do so. Patient refuses and states that he will go to the ED for further assistance.

## 2021-05-15 ENCOUNTER — Other Ambulatory Visit (HOSPITAL_COMMUNITY): Payer: Self-pay | Admitting: *Deleted

## 2021-05-15 ENCOUNTER — Telehealth (HOSPITAL_COMMUNITY): Payer: Self-pay | Admitting: *Deleted

## 2021-05-15 MED ORDER — CLINDAMYCIN PHOSPHATE 1 % EX GEL: Freq: Two times a day (BID) | CUTANEOUS | 3 refills | Status: DC

## 2021-05-15 MED ORDER — CLINDAMYCIN PHOSPHATE 1 % EX SWAB
1.0000 | Freq: Two times a day (BID) | CUTANEOUS | 0 refills | Status: DC
Start: 2021-05-15 — End: 2021-06-05

## 2021-05-15 MED ORDER — CLINDAMYCIN PHOSPHATE 1 % EX GEL: Freq: Two times a day (BID) | CUTANEOUS | 0 refills | Status: DC

## 2021-05-15 MED ORDER — CLINDAMYCIN PHOS-BENZOYL PEROX 1.2-3.75 % EX GEL: 1.0000 | Freq: Two times a day (BID) | CUTANEOUS | 3 refills | Status: DC | PRN

## 2021-05-15 NOTE — Telephone Encounter (Signed)
Previously prescribed clindamycin rectal preparation was not approved by plan.  Clindamycin 1% gel covered and sent to [facility].  LM for patient.

## 2021-05-15 NOTE — Progress Notes (Unsigned)
Attempted to authorize multiple topical remedies related to clindamycin.  Per [employer], Clindamycin 1% topical swabs are on the formulary.  Sent to [employer] and confirmed coverage and patient was notified.

## 2021-05-21 NOTE — Progress Notes (Signed)
[place], [place] [address]   CLINIC:  Medical Oncology/Hematology  PCP:  Henry Johns / Henry Johns [id number] [phone]   REASON FOR VISIT:  Follow-up for colon cancer metastasized to liver  PRIOR THERAPY: none  NGS Results: not done  CURRENT THERAPY: FOLFOX every 2 weeks  BRIEF ONCOLOGIC HISTORY:  Oncology History Overview Note  MMR normal Foundation One: low mutation burden   Metastasis to liver ([place])  01/04/2021 Initial Diagnosis   Metastasis to liver ([place])   01/25/2021 -  Chemotherapy   Patient is on Treatment Plan : COLORECTAL FOLFOX q14d     Colon cancer metastasized to liver ([place])  12/26/2020 Imaging   Ct abdomen and pelvis 1. Extensive hepatic metastatic disease and retroperitoneal adenopathy. 2. A 7 mm right lung base nodule, most consistent with metastatic disease. 3. No bowel obstruction. Normal appendix. 4. Aortic Atherosclerosis (ICD10-I70.0).   12/26/2020 Imaging   CT chest 1. Several small lung nodules consistent with metastatic disease. Henry primary malignancy remains unclear. Henry liver morphology suggests underlying cirrhosis, and multifocal hepatocellular carcinoma should be included in Henry differential diagnosis. 2. No acute abnormality in Henry chest.     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 LIVER, NEEDLE CORE BIOPSY:  -  Metastatic adenocarcinoma  -  See comment   COMMENT:   Henry neoplastic cells are positive for cytokeratin 20 and CDX2 but negative for cytokeratin 7, TTF-1, PSA and prostein.  Henry combined morphology and immunophenotype are consistent with metastasis from a  gastrointestinal primary.  Dr. Saralyn Pilar reviewed Henry case and agrees with Henry above diagnosis.     01/13/2021 Procedure   Colonoscopy - Two 3 to 5 mm polyps in Henry transverse colon, removed with a cold snare. Resected and  retrieved. - Malignant partially obstructing tumor at 17 cm proximal to Henry anus. Biopsied. Tattooed. - Henry distal rectum and anal verge are normal on retroflexion view.   01/13/2021 Cancer Staging   Staging form: Colon and Rectum, AJCC 8th Edition - Clinical stage from 01/13/2021: Stage IVB (cTX, cN0, pM1b) - Signed by Heath Lark, MD on 01/13/2021 Stage prefix: Initial diagnosis Total positive nodes: 0    01/13/2021 Procedure   Colonoscopy - Two 3 to 5 mm polyps in Henry transverse colon, removed with a cold snare. Resected and retrieved. - Malignant partially obstructing tumor at 17 cm proximal to Henry anus. Biopsied. Tattooed. - Henry distal rectum and anal verge are normal on retroflexion view.   01/13/2021 Procedure   EGD - Normal esophagus. - Z-line regular, 40 cm from Henry incisors. - Normal stomach. - Nodular mucosa in   01/13/2021 Pathology Results   A. DUODENUM, NODULE, BIOPSY:  - Peptic duodenitis.  - No dysplasia or malignancy.   B. COLON, SIGMOID, MASS, BIOPSY:  - Invasive adenocarcinoma.   C. COLON, TRANSVERSE, POLYPECTOMY:  - Fragment of ulcerated tissue with adenocarcinoma, see comment.  - Tubular adenoma (X2 fragments).   COMMENT:   B. MMR will be ordered   01/13/2021 Tumor Marker   Patient's tumor was tested for Henry following markers: CEA. Results of Henry tumor marker test revealed 883.   01/24/2021 Procedure   Successful right-sided port placement, with Henry tip of Henry catheter in Henry proximal right atrium.   Plan: Catheter ready for use.   See detailed procedure note with images in PACS. Henry patient tolerated  Henry procedure well without incident or complication and was returned to Recovery in stable conditi   01/25/2021 -  Chemotherapy   Patient is on Treatment Plan : COLORECTAL FOLFOX q14d     01/25/2021 Tumor Marker   Patient's tumor was tested for Henry following markers: CEA. Results of Henry tumor marker test revealed 1378.     CANCER STAGING: Cancer  Staging Colon cancer metastasized to liver [facility]) Staging form: Colon and Rectum, AJCC 8th Edition - Clinical stage from 01/13/2021: Stage IVB (cTX, cN0, pM1b) - Signed by Heath Lark, MD on 01/13/2021   INTERVAL HISTORY:  Henry Johns, a 60 y.o. male, returns for routine follow-up and consideration for next cycle of chemotherapy. [name] was last seen on 05/08/2021.  Due for cycle #9 of FOLFOX today.   Overall, he tells me he has been feeling pretty well. He reports continued cold sensitivity and burning sensation in his hands and above his tailbone which he comes in to contact with cold items; this was not helped by clindamycin swabs. He reports reduced appetite due to Henry cold sensitivity which has been constant since his last treatment. He reports numbness in his fingertips. He reports rash under his arms, groin, on his tailbone, and on his scalp which has not been helped with Doxycyline. He reports nausea and occasional diarrhea, and he denies vomiting. He reports occasional constipation as well for which he is taking lactulose as needed. He reports occasional dizziness upon standing. His sleep has improved and he no longer requires Restoril. He also reports swelling in his left leg.   Overall, he feels ready for next cycle of chemo today.   REVIEW OF SYSTEMS:  Review of Systems  Constitutional:  Positive for appetite change (40%) and fatigue (20%). Negative for unexpected weight change (+5 lbs).  Cardiovascular:  Positive for leg swelling (L leg).  Gastrointestinal:  Positive for constipation (occasional), diarrhea (occasional) and nausea. Negative for vomiting.  Musculoskeletal:  Positive for back pain (2/10).  Neurological:  Positive for dizziness and numbness (fingertips).  Psychiatric/Behavioral:  Negative for sleep disturbance.   All other systems reviewed and are negative.  PAST MEDICAL/SURGICAL HISTORY:  Past Medical History:  Diagnosis Date   Diabetes mellitus without  complication ([place])    Hypertension    Past Surgical History:  Procedure Laterality Date   BIOPSY  01/13/2021   Procedure: BIOPSY;  Surgeon: Harvel Quale, MD;  [facility];  Service: Gastroenterology;;   CATARACT EXTRACTION Right    COLONOSCOPY WITH PROPOFOL N/A 01/13/2021   Procedure: COLONOSCOPY WITH PROPOFOL;  Surgeon: Harvel Quale, MD;  Location: [facility];  Service: Gastroenterology;  Laterality: N/A;  11:05   ESOPHAGOGASTRODUODENOSCOPY (EGD) WITH PROPOFOL N/A 01/13/2021   Procedure: ESOPHAGOGASTRODUODENOSCOPY (EGD) WITH PROPOFOL;  Surgeon: Harvel Quale, MD;  Location: [facility];  Service: Gastroenterology;  Laterality: N/A;   IR IMAGING GUIDED PORT INSERTION  01/24/2021   LIVER BIOPSY  01/10/2021   U/S guided   POLYPECTOMY  01/13/2021   Procedure: POLYPECTOMY;  Surgeon: Harvel Quale, MD;  [facility];  Service: Gastroenterology;;    SOCIAL HISTORY:  Social History   Socioeconomic History   Marital status: Married    Spouse name: Not on file   Number of children: Not on file   Years of education: Not on file   Highest education level: Not on file  Occupational History   Not on file  Tobacco Use   Smoking status: Never  Smokeless tobacco: Never  Vaping Use   Vaping Use: Never used  Substance and Sexual Activity   Alcohol use: Not Currently   Drug use: Not Currently   Sexual activity: Not Currently  Other Topics Concern   Not on file  Social History Narrative   Married, no childer   Social Determinants of Health   Financial Resource Strain: Not on file  Food Insecurity: Not on file  Transportation Needs: No Transportation Needs   Lack of Transportation (Medical): No   Lack of Transportation (Non-Medical): No  Physical Activity: Inactive   Days of Exercise per Week: 0 days   Minutes of Exercise per Session: 0 min  Stress: Not on file  Social Connections: Not on file  Intimate Partner  Violence: Not At Risk   Fear of Current or Ex-Partner: No   Emotionally Abused: No   Physically Abused: No   Sexually Abused: No    FAMILY HISTORY:  Family History  Problem Relation Age of Onset   Cancer Father 47       prostate ca    CURRENT MEDICATIONS:  Current Outpatient Medications  Medication Sig Dispense Refill   acetaminophen (TYLENOL) 500 MG tablet Take 1,000 mg by mouth every 6 (six) hours as needed for moderate pain.     atenolol (TENORMIN) 50 MG tablet Take 50 mg by mouth daily.     clindamycin (CLEOCIN T) 1 % SWAB Apply 1 application topically 2 (two) times daily. To anal area 60 each 0   clobetasol cream (TEMOVATE) 9.48 % Apply 1 application topically 2 (two) times daily. Apply to both hands twice a day 30 g 0   clobetasol ointment (TEMOVATE) 0.05 % Apply twice a day on affected areas for 2 weeks, then once a day for two weeks, after that use every other day     doxycycline (VIBRA-TABS) 100 MG tablet Take 1 tablet (100 mg total) by mouth 2 (two) times daily. 60 tablet 6   fluorouracil CALGB 54627 2,400 mg/m2 in sodium chloride 0.9 % 150 mL Inject 2,400 mg/m2 into Henry vein over 48 hr.     hydrocortisone 1 % ointment Apply 1 application topically 2 (two) times daily. 60 g 6   hydrocortisone-pramoxine (PROCTOFOAM HC) rectal foam Place 1 applicator rectally 2 (two) times daily. 10 g 6   insulin NPH-regular Human (70-30) 100 UNIT/ML injection Inject 10-12 Units into Henry skin 2 (two) times daily.     Lactulose 20 GM/30ML SOLN Take 30 mLs (20 g total) by mouth in Henry morning. 450 mL 0   LEUCOVORIN CALCIUM IV Inject 400 mg/m2 into Henry vein every 14 (fourteen) days.     lidocaine (XYLOCAINE) 5 % ointment Apply 1 application topically in Henry morning and at bedtime. Apply to both hands twice a day 35.44 g 0   Lidocaine, Anorectal, 5 % CREA Apply 1 application topically 2 (two) times daily as needed. 30 g 3   LORazepam (ATIVAN) 0.5 MG tablet Take 1 tablet by mouth twice daily as  needed for anxiety 60 tablet 3   magnesium oxide (MAG-OX) 400 (240 Mg) MG tablet Take 1 tablet (400 mg total) by mouth 2 (two) times daily. 60 tablet 3   morphine (MSIR) 15 MG tablet Take 1 tablet (15 mg total) by mouth 2 (two) times daily as needed for severe pain. 60 tablet 0   Multiple Vitamin (MULTIVITAMIN WITH MINERALS) TABS tablet Take 1 tablet by mouth daily.     omeprazole (PRILOSEC) 40 MG capsule  Take 1 capsule (40 mg total) by mouth daily. 90 capsule 3   OXALIPLATIN IV Inject 85 mg/m2 into Henry vein every 14 (fourteen) days.     pentoxifylline (TRENTAL) 400 MG CR tablet Take by mouth.     polyethylene glycol (MIRALAX) 17 g packet Take 17 g by mouth daily. 30 each 3   Potassium Chloride ER (K-TAB) 20 MEQ TBCR Take 20 mEq by mouth daily. 30 tablet 3   simvastatin (ZOCOR) 20 MG tablet SMARTSIG:1 Tablet(s) By Mouth Every Evening     sucralfate (CARAFATE) 1 GM/10ML suspension SMARTSIG:Milliliter(s) By Mouth     temazepam (RESTORIL) 15 MG capsule TAKE 1 CAPSULE BY MOUTH AT BEDTIME AS NEEDED FOR SLEEP 30 capsule 0   lidocaine-prilocaine (EMLA) cream Apply to affected area once (Patient not taking: Reported on 05/22/2021) 30 g 3   ondansetron (ZOFRAN) 8 MG tablet Take 1 tablet (8 mg total) by mouth every 8 (eight) hours as needed for nausea. (Patient not taking: Reported on 05/22/2021) 60 tablet 3   prochlorperazine (COMPAZINE) 10 MG tablet Take 1 tablet by mouth every 6 (six) hours as needed. (Patient not taking: Reported on 05/22/2021)     No current facility-administered medications for this visit.    ALLERGIES:  No Known Allergies  PHYSICAL EXAM:  Performance status (ECOG): 1 - Symptomatic but completely ambulatory  Vitals:   05/22/21 0819  BP: 108/77  Pulse: (!) 102  Resp: 18  Temp: (!) 96.2 F (35.7 C)  SpO2: 100%   Wt Readings from Last 3 Encounters:  05/08/21 164 lb 3.2 oz (74.5 kg)  04/24/21 169 lb 3.2 oz (76.7 kg)  04/10/21 168 lb 12.8 oz (76.6 kg)   Physical  Exam Vitals reviewed.  Constitutional:      Appearance: Normal appearance.  Cardiovascular:     Rate and Rhythm: Normal rate and regular rhythm.     Pulses: Normal pulses.     Heart sounds: Normal heart sounds.  Pulmonary:     Effort: Pulmonary effort is normal.     Breath sounds: Normal breath sounds.  Musculoskeletal:     Left foot: Swelling present.  Skin:    Findings: Erythema and rash (on interior natal cleft with small shallow ulcers) present.  Neurological:     General: No focal deficit present.     Mental Status: He is alert and oriented to person, place, and time.  Psychiatric:        Mood and Affect: Mood normal.        Behavior: Behavior normal.    LABORATORY DATA:  I have reviewed Henry labs as listed.  CBC Latest Ref Rng & Units 05/22/2021 05/08/2021 04/28/2021  WBC 4.0 - 10.5 K/uL 17.4(H) 12.2(H) 80.9(HH)  Hemoglobin 13.0 - 17.0 g/dL 11.1(L) 10.9(L) 11.1(L)  Hematocrit 39.0 - 52.0 % 33.5(L) 31.6(L) 31.8(L)  Platelets 150 - 400 K/uL 136(L) 143(L) 131(L)   CMP Latest Ref Rng & Units 05/08/2021 04/28/2021 04/27/2021  Glucose 70 - 99 mg/dL 126(H) 118(H) 83  BUN 6 - 20 mg/dL $Remove'6 14 13  'hnREBvA$ Creatinine 0.61 - 1.24 mg/dL 0.60(L) 0.56(L) 0.71  Sodium 135 - 145 mmol/L 138 138 137  Potassium 3.5 - 5.1 mmol/L 2.8(L) 3.0(L) 5.9(H)  Chloride 98 - 111 mmol/L 104 103 103  CO2 22 - 32 mmol/L $RemoveB'26 25 23  'RaSFEkBl$ Calcium 8.9 - 10.3 mg/dL 8.1(L) 8.1(L) 8.6(L)  Total Protein 6.5 - 8.1 g/dL 5.4(L) 5.4(L) 5.8(L)  Total Bilirubin 0.3 - 1.2 mg/dL 0.9 1.0 1.8(H)  Alkaline Phos  38 - 126 U/L 382(H) 530(H) 481(H)  AST 15 - 41 U/L 52(H) 65(H) 86(H)  ALT 0 - 44 U/L $Remo'26 26 29    'uXjna$ DIAGNOSTIC IMAGING:  I have independently reviewed Henry scans and discussed with Henry patient. No results found.   ASSESSMENT:  1.  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 - CT [facility] with contrast on 12/26/2020 showed extensive hepatic metastatic  disease and retroperitoneal adenopathy.  CT chest without contrast on 12/26/2020 showed several small subcentimeter lung nodules consistent with metastatic disease.  Liver morphology suggests underlying cirrhosis. - Liver biopsy on 01/10/2021 consistent with metastatic adenocarcinoma, CK20 and CDX2 positive, negative for CK7, TTF-1, PSA and prostein. - Colonoscopy and biopsy of Henry sigmoid mass on 01/13/2021 consistent with adenocarcinoma. - 10 pound weight loss prior to start of therapy. - FOLFOX started on 01/25/2021. - Foundation 1 testing on 01/10/2021 showed MSI-stable, TMB low, negative for RAS/BRAF/HER2 mutation.  2.  Social/family history: - Lives at home with his wife.  He drove truck and operated equipment.  No smoking history. - Father had prostate cancer   PLAN:  1.  Metastatic sigmoid colon cancer to liver and lungs: - Last CEA improved to 56.6 on 04/27/2021. - CTCAP on 04/19/2021 showed interval decrease in size of multiple lung nodules and decrease in size of numerous liver lesions. - He reported cold sensitivity.  He had occasional nausea but denied any vomiting.  He also had occasional diarrhea. - He reported fingertip numbness all Henry time.  Cold sensitivity lasted all 2 weeks. - I will discontinue oxaliplatin.  Due to worsening rash, we will hold Vectibix.  He had rash on Henry dorsum of Henry hands, in Henry groin region which burns and over Henry tailbone. - Reviewed labs today which showed normal LFTs.  Albumin is low at 2.7.  CBC was grossly normal. - He will proceed with 5-FU bolus and infusional 5-FU today. - He has lower extremity swelling, left dorsum of Henry foot slightly worse than right side.  He will take Lasix 20 mg as needed. - RTC 2 weeks for follow-up with repeat labs.  2.  Coccygeal pain: - Continue half tablet of MS IR at bedtime as needed.  He is not requiring it every day.  3.  Hypokalemia: - Continue potassium 20 mEq daily.  Potassium today is normal.  4.   Difficulty falling/staying asleep: - Continue Restoril 15 mg at bedtime.  5.  Constipation: - Continue lactulose and stool softener as needed.  6.  Hypomagnesemia: - Continue magnesium 400 mg twice daily.  Magnesium today is normal.  7.  EGFR antibody induced rash: - Continue doxycycline twice daily. - He has erythematous rash with superficial ulcers in Henry sacral region.  We will prescribe him Silvadene ointment.  We are holding Vectibix.  8.  Anxiety: - Continue Ativan 0.5 mg daily as needed.       Orders placed this encounter:  No orders of Henry defined types were placed in this encounter.    Derek Jack, MD [place] [phone]   I, Thana Ates, am acting as a scribe for Dr. Derek Jack.  I, Derek Jack MD, have reviewed Henry above documentation for accuracy and completeness, and I agree with Henry above.

## 2021-05-22 ENCOUNTER — Other Ambulatory Visit: Payer: Self-pay

## 2021-05-22 ENCOUNTER — Inpatient Hospital Stay (HOSPITAL_BASED_OUTPATIENT_CLINIC_OR_DEPARTMENT_OTHER): Payer: Medicaid Other | Admitting: Hematology

## 2021-05-22 ENCOUNTER — Inpatient Hospital Stay (HOSPITAL_COMMUNITY): Payer: Medicaid Other

## 2021-05-22 VITALS — BP 108/77 | HR 102 | Temp 96.2°F | Resp 18

## 2021-05-22 VITALS — BP 104/70 | HR 89 | Temp 96.7°F | Resp 18 | Wt 162.0 lb

## 2021-05-22 DIAGNOSIS — Z5111 Encounter for antineoplastic chemotherapy: Secondary | ICD-10-CM | POA: Diagnosis not present

## 2021-05-22 DIAGNOSIS — E875 Hyperkalemia: Secondary | ICD-10-CM

## 2021-05-22 DIAGNOSIS — C189 Malignant neoplasm of colon, unspecified: Secondary | ICD-10-CM

## 2021-05-22 DIAGNOSIS — I959 Hypotension, unspecified: Secondary | ICD-10-CM | POA: Diagnosis not present

## 2021-05-22 DIAGNOSIS — C787 Secondary malignant neoplasm of liver and intrahepatic bile duct: Secondary | ICD-10-CM

## 2021-05-22 LAB — COMPREHENSIVE METABOLIC PANEL
ALT: 18 U/L (ref 0–44)
AST: 41 U/L (ref 15–41)
Albumin: 2.7 g/dL — ABNORMAL LOW (ref 3.5–5.0)
Alkaline Phosphatase: 451 U/L — ABNORMAL HIGH (ref 38–126)
Anion gap: 7 (ref 5–15)
BUN: 9 mg/dL (ref 6–20)
CO2: 25 mmol/L (ref 22–32)
Calcium: 8.3 mg/dL — ABNORMAL LOW (ref 8.9–10.3)
Chloride: 106 mmol/L (ref 98–111)
Creatinine, Ser: 0.59 mg/dL — ABNORMAL LOW (ref 0.61–1.24)
GFR, Estimated: >60 mL/min (ref >60)
Glucose, Bld: 106 mg/dL — ABNORMAL HIGH (ref 70–99)
Potassium: 3.5 mmol/L (ref 3.5–5.1)
Sodium: 138 mmol/L (ref 135–145)
Total Bilirubin: 1.1 mg/dL (ref 0.3–1.2)
Total Protein: 5.3 g/dL — ABNORMAL LOW (ref 6.5–8.1)

## 2021-05-22 LAB — CBC WITH DIFFERENTIAL/PLATELET
Abs Immature Granulocytes: 0.62 10*3/uL — ABNORMAL HIGH (ref 0.00–0.07)
Basophils Absolute: 0.2 10*3/uL — ABNORMAL HIGH (ref 0.0–0.1)
Basophils Relative: 1 %
Eosinophils Absolute: 0.2 10*3/uL (ref 0.0–0.5)
Eosinophils Relative: 1 %
HCT: 33.5 % — ABNORMAL LOW (ref 39.0–52.0)
Hemoglobin: 11.1 g/dL — ABNORMAL LOW (ref 13.0–17.0)
Immature Granulocytes: 4 %
Lymphocytes Relative: 15 %
Lymphs Abs: 2.5 10*3/uL (ref 0.7–4.0)
MCH: 31.4 pg (ref 26.0–34.0)
MCHC: 33.1 g/dL (ref 30.0–36.0)
MCV: 94.9 fL (ref 80.0–100.0)
Monocytes Absolute: 1.4 10*3/uL — ABNORMAL HIGH (ref 0.1–1.0)
Monocytes Relative: 8 %
Neutro Abs: 12.5 10*3/uL — ABNORMAL HIGH (ref 1.7–7.7)
Neutrophils Relative %: 71 %
Platelets: 136 10*3/uL — ABNORMAL LOW (ref 150–400)
RBC: 3.53 MIL/uL — ABNORMAL LOW (ref 4.22–5.81)
RDW: 19.3 % — ABNORMAL HIGH (ref 11.5–15.5)
WBC: 17.4 10*3/uL — ABNORMAL HIGH (ref 4.0–10.5)
nRBC: 0.1 % (ref 0.0–0.2)

## 2021-05-22 LAB — MAGNESIUM: Magnesium: 1.9 mg/dL (ref 1.7–2.4)

## 2021-05-22 MED ORDER — PALONOSETRON HCL INJECTION 0.25 MG/5ML
0.2500 mg | Freq: Once | INTRAVENOUS | Status: AC
  Administered 2021-05-22: 0.25 mg via INTRAVENOUS
  Filled 2021-05-22: qty 5

## 2021-05-22 MED ORDER — FUROSEMIDE 20 MG PO TABS: 20.0000 mg | ORAL_TABLET | Freq: Every day | ORAL | 0 refills | Status: DC | PRN

## 2021-05-22 MED ORDER — SODIUM CHLORIDE 0.9 % IV SOLN
1920.0000 mg/m2 | INTRAVENOUS | Status: DC
  Administered 2021-05-22: 3750 mg via INTRAVENOUS
  Filled 2021-05-22: qty 75

## 2021-05-22 MED ORDER — SILVER SULFADIAZINE 1 % EX CREA: 1.0000 "application " | TOPICAL_CREAM | Freq: Every day | CUTANEOUS | 0 refills | Status: DC

## 2021-05-22 MED ORDER — SODIUM CHLORIDE 0.9 % IV SOLN
400.0000 mg/m2 | Freq: Once | INTRAVENOUS | Status: AC
  Administered 2021-05-22: 784 mg via INTRAVENOUS
  Filled 2021-05-22: qty 39.2

## 2021-05-22 MED ORDER — HYDROCODONE-ACETAMINOPHEN 5-325 MG PO TABS
2.0000 | ORAL_TABLET | Freq: Once | ORAL | Status: AC
  Administered 2021-05-22: 2 via ORAL
  Filled 2021-05-22: qty 2

## 2021-05-22 MED ORDER — DEXTROSE 5 % IV SOLN: Freq: Once | INTRAVENOUS | Status: AC

## 2021-05-22 MED ORDER — SODIUM CHLORIDE 0.9 % IV SOLN
10.0000 mg | Freq: Once | INTRAVENOUS | Status: AC
  Administered 2021-05-22: 10 mg via INTRAVENOUS
  Filled 2021-05-22: qty 1

## 2021-05-22 MED ORDER — SODIUM CHLORIDE 0.9% FLUSH: 10.0000 mL | INTRAVENOUS | Status: DC | PRN

## 2021-05-22 MED ORDER — FLUOROURACIL CHEMO INJECTION 2.5 GM/50ML
320.0000 mg/m2 | Freq: Once | INTRAVENOUS | Status: AC
  Administered 2021-05-22: 650 mg via INTRAVENOUS
  Filled 2021-05-22: qty 13

## 2021-05-22 MED ORDER — HYDROCODONE-ACETAMINOPHEN 10-325 MG PO TABS: 1.0000 | ORAL_TABLET | Freq: Once | ORAL | Status: DC

## 2021-05-22 NOTE — Progress Notes (Signed)
Patient seen and assessed, labs reviewed by Dr. Delton Coombes. Okay to proceed with treatment today per Dr. Delton Coombes. Primary RN and Pharmacy made aware.

## 2021-05-22 NOTE — Patient Instructions (Addendum)
[place] at [facility] Discharge Instructions  You were seen and examined today by Dr. Delton Coombes.  Dr. Delton Coombes will remove the Oxaliplatin, this is the medication that causes cold sensitivity. Dr. Delton Coombes will also remove Vectibix that is causing the rash for this treatment and potentially the next treatment also.   Thank you for choosing [place] at [facility] to provide your oncology and hematology care.  To afford each patient quality time with our provider, please arrive at least 15 minutes before your scheduled appointment time.   If you have a lab appointment with the [facility] please come in thru the Main Entrance and check in at the main information desk.  You need to re-schedule your appointment should you arrive 10 or more minutes late.  We strive to give you quality time with our providers, and arriving late affects you and other patients whose appointments are after yours.  Also, if you no show three or more times for appointments you may be dismissed from the clinic at the providers discretion.     Again, thank you for choosing [facility].  Our hope is that these requests will decrease the amount of time that you wait before being seen by our physicians.       _____________________________________________________________  Should you have questions after your visit to [facility], please contact our office at [phone] and follow the prompts.  Our office hours are 8:00 a.m. and 4:30 p.m. Monday - Friday.  Please note that voicemails left after 4:00 p.m. may not be returned until the following business day.  We are closed weekends and major holidays.  You do have access to a nurse 24-7, just call the main number to the clinic [phone] and do not press any options, hold on the line and a nurse will answer the phone.    For prescription refill requests, have your pharmacy contact our office  and allow 72 hours.    Due to Covid, you will need to wear a mask upon entering the hospital. If you do not have a mask, a mask will be given to you at the Main Entrance upon arrival. For doctor visits, patients may have 1 support person age 35 or older with them. For treatment visits, patients can not have anyone with them due to social distancing guidelines and our immunocompromised population.

## 2021-05-22 NOTE — Progress Notes (Signed)
Received notice from Dr Delton Coombes to add to today's plan:  Fluorouracil 320 mg/m2 IV push x 1 Patient will not receive Vectibix and oxaliplatin today.  Orders updated as above.  Pharmacy does not stock Hydrocodone 10-325 in Pyxis- received ok to change to 2 x Hydrocodone 5-325 mg x 1 dose.  T.O. Dr Rhys Martini, PharmD 05/22/21 @ 0900

## 2021-05-22 NOTE — Patient Instructions (Signed)
[place]  Discharge Instructions: Thank you for choosing [facility] to provide your oncology and hematology care.  If you have a lab appointment with the [facility], please come in thru the Main Entrance and check in at the main information desk.  Wear comfortable clothing and clothing appropriate for easy access to any Portacath or PICC line.   We strive to give you quality time with your provider. You may need to reschedule your appointment if you arrive late (15 or more minutes).  Arriving late affects you and other patients whose appointments are after yours.  Also, if you miss three or more appointments without notifying the office, you may be dismissed from the clinic at the provider's discretion.      For prescription refill requests, have your pharmacy contact our office and allow 72 hours for refills to be completed.    Today you received the following chemotherapy and/or immunotherapy agents leucovroin, and 5FU. Return Wednesday to have your pump removed.  No vecitbex or oxaliplatin today.     The chemotherapy medication bag should finish at 46 hours, 96 hours, or 7 days. For example, if your pump is scheduled for 46 hours and it was put on at 4:00 p.m., it should finish at 2:00 p.m. the day it is scheduled to come off regardless of your appointment time.     Estimated time to finish at 1030 am.   If the display on your pump reads "Low Volume" and it is beeping, take the batteries out of the pump and come to the cancer center for it to be taken off.   If the pump alarms go off prior to the pump reading "Low Volume" then call [phone] and someone can assist you.  If the plunger comes out and the chemotherapy medication is leaking out, please use your home chemo spill kit to clean up the spill. Do NOT use paper towels or other household products.  If you have problems or questions regarding your pump, please call either [phone] (24  hours a day) or the cancer center Monday-Friday 8:00 a.m.- 4:30 p.m. at the clinic number and we will assist you. If you are unable to get assistance, then go to the nearest Emergency Department and ask the staff to contact the IV team for assistance.    To help prevent nausea and vomiting after your treatment, we encourage you to take your nausea medication as directed.  BELOW ARE SYMPTOMS THAT SHOULD BE REPORTED IMMEDIATELY: *FEVER GREATER THAN 100.4 F (38 C) OR HIGHER *CHILLS OR SWEATING *NAUSEA AND VOMITING THAT IS NOT CONTROLLED WITH YOUR NAUSEA MEDICATION *UNUSUAL SHORTNESS OF BREATH *UNUSUAL BRUISING OR BLEEDING *URINARY PROBLEMS (pain or burning when urinating, or frequent urination) *BOWEL PROBLEMS (unusual diarrhea, constipation, pain near the anus) TENDERNESS IN MOUTH AND THROAT WITH OR WITHOUT PRESENCE OF ULCERS (sore throat, sores in mouth, or a toothache) UNUSUAL RASH, SWELLING OR PAIN  UNUSUAL VAGINAL DISCHARGE OR ITCHING   Items with * indicate a potential emergency and should be followed up as soon as possible or go to the Emergency Department if any problems should occur.  Please show the CHEMOTHERAPY ALERT CARD or IMMUNOTHERAPY ALERT CARD at check-in to the Emergency Department and triage nurse.  Should you have questions after your visit or need to cancel or reschedule your appointment, please contact [facility] [phone]  and follow the prompts.  Office hours are 8:00 a.m. to 4:30 p.m. Monday - Friday. Please  note that voicemails left after 4:00 p.m. may not be returned until the following business day.  We are closed weekends and major holidays. You have access to a nurse at all times for urgent questions. Please call the main number to the clinic [phone] and follow the prompts.  For any non-urgent questions, you may also contact your provider using MyChart. We now offer e-Visits for anyone 59 and older to request care online for non-urgent symptoms.  For details visit [email or web address].   Also download the MyChart app! Go to the app store, search "MyChart", open the app, select [facility], and log in with your MyChart username and password.  Due to Covid, a mask is required upon entering the hospital/clinic. If you do not have a mask, one will be given to you upon arrival. For doctor visits, patients may have 1 support person aged 59 or older with them. For treatment visits, patients cannot have anyone with them due to current Covid guidelines and our immunocompromised population.   Leucovorin injection What is this medication? LEUCOVORIN (loo koe VOR in) is used to prevent or treat the harmful effects of some medicines. This medicine is used to treat anemia caused by a low amount of folic acid in the body. It is also used with 5-fluorouracil (5-FU) to treat colon cancer. This medicine may be used for other purposes; ask your health care provider or pharmacist if you have questions. What should I tell my care team before I take this medication? They need to know if you have any of these conditions: anemia from low levels of vitamin B-12 in the blood an unusual or allergic reaction to leucovorin, folic acid, other medicines, foods, dyes, or preservatives pregnant or trying to get pregnant breast-feeding How should I use this medication? This medicine is for injection into a muscle or into a vein. It is given by a health care professional in a hospital or clinic setting. Talk to your pediatrician regarding the use of this medicine in children. Special care may be needed. Overdosage: If you think you have taken too much of this medicine contact a poison control center or emergency room at once. NOTE: This medicine is only for you. Do not share this medicine with others. What if I miss a dose? This does not apply. What may interact with this  medication? capecitabine fluorouracil phenobarbital phenytoin primidone trimethoprim-sulfamethoxazole This list may not describe all possible interactions. Give your health care provider a list of all the medicines, herbs, non-prescription drugs, or dietary supplements you use. Also tell them if you smoke, drink alcohol, or use illegal drugs. Some items may interact with your medicine. What should I watch for while using this medication? Your condition will be monitored carefully while you are receiving this medicine. This medicine may increase the side effects of 5-fluorouracil, 5-FU. Tell your doctor or health care professional if you have diarrhea or mouth sores that do not get better or that get worse. What side effects may I notice from receiving this medication? Side effects that you should report to your doctor or health care professional as soon as possible: allergic reactions like skin rash, itching or hives, swelling of the face, lips, or tongue breathing problems fever, infection mouth sores unusual bleeding or bruising unusually weak or tired Side effects that usually do not require medical attention (report to your doctor or health care professional if they continue or are bothersome): constipation or diarrhea loss of appetite nausea, vomiting This list may not describe  all possible side effects. Call your doctor for medical advice about side effects. You may report side effects to FDA at 1-800-FDA-1088. Where should I keep my medication? This drug is given in a hospital or clinic and will not be stored at home. NOTE: This sheet is a summary. It may not cover all possible information. If you have questions about this medicine, talk to your doctor, pharmacist, or health care provider.  2022 Elsevier/Gold Standard (2008-01-01 00:00:00)  Fluorouracil, 5-FU injection What is this medication? FLUOROURACIL, 5-FU (flure oh YOOR a sil) is a chemotherapy drug. It slows the growth  of cancer cells. This medicine is used to treat many types of cancer like breast cancer, colon or rectal cancer, pancreatic cancer, and stomach cancer. This medicine may be used for other purposes; ask your health care provider or pharmacist if you have questions. COMMON BRAND NAME(S): Adrucil What should I tell my care team before I take this medication? They need to know if you have any of these conditions: blood disorders dihydropyrimidine dehydrogenase (DPD) deficiency infection (especially a virus infection such as chickenpox, cold sores, or herpes) kidney disease liver disease malnourished, poor nutrition recent or ongoing radiation therapy an unusual or allergic reaction to fluorouracil, other chemotherapy, other medicines, foods, dyes, or preservatives pregnant or trying to get pregnant breast-feeding How should I use this medication? This drug is given as an infusion or injection into a vein. It is administered in a hospital or clinic by a specially trained health care professional. Talk to your pediatrician regarding the use of this medicine in children. Special care may be needed. Overdosage: If you think you have taken too much of this medicine contact a poison control center or emergency room at once. NOTE: This medicine is only for you. Do not share this medicine with others. What if I miss a dose? It is important not to miss your dose. Call your doctor or health care professional if you are unable to keep an appointment. What may interact with this medication? Do not take this medicine with any of the following medications: live virus vaccines This medicine may also interact with the following medications: medicines that treat or prevent blood clots like warfarin, enoxaparin, and dalteparin This list may not describe all possible interactions. Give your health care provider a list of all the medicines, herbs, non-prescription drugs, or dietary supplements you use. Also tell  them if you smoke, drink alcohol, or use illegal drugs. Some items may interact with your medicine. What should I watch for while using this medication? Visit your doctor for checks on your progress. This drug may make you feel generally unwell. This is not uncommon, as chemotherapy can affect healthy cells as well as cancer cells. Report any side effects. Continue your course of treatment even though you feel ill unless your doctor tells you to stop. In some cases, you may be given additional medicines to help with side effects. Follow all directions for their use. Call your doctor or health care professional for advice if you get a fever, chills or sore throat, or other symptoms of a cold or flu. Do not treat yourself. This drug decreases your body's ability to fight infections. Try to avoid being around people who are sick. This medicine may increase your risk to bruise or bleed. Call your doctor or health care professional if you notice any unusual bleeding. Be careful brushing and flossing your teeth or using a toothpick because you may get an infection or bleed  more easily. If you have any dental work done, tell your dentist you are receiving this medicine. Avoid taking products that contain aspirin, acetaminophen, ibuprofen, naproxen, or ketoprofen unless instructed by your doctor. These medicines may hide a fever. Do not become pregnant while taking this medicine. Women should inform their doctor if they wish to become pregnant or think they might be pregnant. There is a potential for serious side effects to an unborn child. Talk to your health care professional or pharmacist for more information. Do not breast-feed an infant while taking this medicine. Men should inform their doctor if they wish to father a child. This medicine may lower sperm counts. Do not treat diarrhea with over the counter products. Contact your doctor if you have diarrhea that lasts more than 2 days or if it is severe and  watery. This medicine can make you more sensitive to the sun. Keep out of the sun. If you cannot avoid being in the sun, wear protective clothing and use sunscreen. Do not use sun lamps or tanning beds/booths. What side effects may I notice from receiving this medication? Side effects that you should report to your doctor or health care professional as soon as possible: allergic reactions like skin rash, itching or hives, swelling of the face, lips, or tongue low blood counts - this medicine may decrease the number of white blood cells, red blood cells and platelets. You may be at increased risk for infections and bleeding. signs of infection - fever or chills, cough, sore throat, pain or difficulty passing urine signs of decreased platelets or bleeding - bruising, pinpoint red spots on the skin, black, tarry stools, blood in the urine signs of decreased red blood cells - unusually weak or tired, fainting spells, lightheadedness breathing problems changes in vision chest pain mouth sores nausea and vomiting pain, swelling, redness at site where injected pain, tingling, numbness in the hands or feet redness, swelling, or sores on hands or feet stomach pain unusual bleeding Side effects that usually do not require medical attention (report to your doctor or health care professional if they continue or are bothersome): changes in finger or toe nails diarrhea dry or itchy skin hair loss headache loss of appetite sensitivity of eyes to the light stomach upset unusually teary eyes This list may not describe all possible side effects. Call your doctor for medical advice about side effects. You may report side effects to FDA at 1-800-FDA-1088. Where should I keep my medication? This drug is given in a hospital or clinic and will not be stored at home. NOTE: This sheet is a summary. It may not cover all possible information. If you have questions about this medicine, talk to your doctor,  pharmacist, or health care provider.  2022 Elsevier/Gold Standard (2021-03-14 00:00:00)

## 2021-05-22 NOTE — Progress Notes (Signed)
Patients port flushed without difficulty.  Good blood return noted with no bruising or swelling noted at site.  See flowsheet for further information.  Patient remain accessed for chemotherapy treatment.

## 2021-05-22 NOTE — Progress Notes (Signed)
Pt here for D1C9 of vectibex and FOLFOX.  Pt is complaining of nausea today. Okay to proceed with treatment per Dr Raliegh Ip, No Vectibix this treatment. Discontinuing oxaliplatin. 2 Hydrocodone 5-325 given for pain in buttocks.   Tolerated treatment well today without incidence.  Stable during and after treatment. Connected to ambulatory 5FU pump and running. Vital signs stable prior to discharge. Discharged in stable condition ambulatory.    AVS reviewed.

## 2021-05-23 LAB — CEA: CEA: 28.6 ng/mL — ABNORMAL HIGH (ref 0.0–4.7)

## 2021-05-24 ENCOUNTER — Other Ambulatory Visit: Payer: Self-pay

## 2021-05-24 ENCOUNTER — Inpatient Hospital Stay (HOSPITAL_COMMUNITY): Payer: Medicaid Other

## 2021-05-24 VITALS — BP 98/70 | HR 100 | Temp 98.5°F | Resp 18

## 2021-05-24 DIAGNOSIS — Z5111 Encounter for antineoplastic chemotherapy: Secondary | ICD-10-CM | POA: Diagnosis not present

## 2021-05-24 DIAGNOSIS — C189 Malignant neoplasm of colon, unspecified: Secondary | ICD-10-CM

## 2021-05-24 DIAGNOSIS — C787 Secondary malignant neoplasm of liver and intrahepatic bile duct: Secondary | ICD-10-CM

## 2021-05-24 MED ORDER — PEGFILGRASTIM-CBQV 6 MG/0.6ML ~~LOC~~ SOSY: 6.0000 mg | PREFILLED_SYRINGE | Freq: Once | SUBCUTANEOUS | Status: DC

## 2021-05-24 MED ORDER — SODIUM CHLORIDE 0.9% FLUSH
10.0000 mL | INTRAVENOUS | Status: DC | PRN
  Administered 2021-05-24: 10 mL

## 2021-05-24 MED ORDER — HEPARIN SOD (PORK) LOCK FLUSH 100 UNIT/ML IV SOLN
500.0000 [IU] | Freq: Once | INTRAVENOUS | Status: AC | PRN
  Administered 2021-05-24: 500 [IU]

## 2021-05-24 NOTE — Patient Instructions (Signed)
[place] [facility]  Discharge Instructions: Thank you for choosing [facility] to provide your oncology and hematology care.  If you have a lab appointment with the [facility], please come in thru the Main Entrance and check in at the main information desk.  Wear comfortable clothing and clothing appropriate for easy access to any Portacath or PICC line.   We strive to give you quality time with your provider. You may need to reschedule your appointment if you arrive late (15 or more minutes).  Arriving late affects you and other patients whose appointments are after yours.  Also, if you miss three or more appointments without notifying the office, you may be dismissed from the clinic at the provider's discretion.      For prescription refill requests, have your pharmacy contact our office and allow 72 hours for refills to be completed.    Today you received the following chemotherapy and/or immunotherapy agents Pump Stop and deaccess      To help prevent nausea and vomiting after your treatment, we encourage you to take your nausea medication as directed.  BELOW ARE SYMPTOMS THAT SHOULD BE REPORTED IMMEDIATELY: *FEVER GREATER THAN 100.4 F (38 C) OR HIGHER *CHILLS OR SWEATING *NAUSEA AND VOMITING THAT IS NOT CONTROLLED WITH YOUR NAUSEA MEDICATION *UNUSUAL SHORTNESS OF BREATH *UNUSUAL BRUISING OR BLEEDING *URINARY PROBLEMS (pain or burning when urinating, or frequent urination) *BOWEL PROBLEMS (unusual diarrhea, constipation, pain near the anus) TENDERNESS IN MOUTH AND THROAT WITH OR WITHOUT PRESENCE OF ULCERS (sore throat, sores in mouth, or a toothache) UNUSUAL RASH, SWELLING OR PAIN  UNUSUAL VAGINAL DISCHARGE OR ITCHING   Items with * indicate a potential emergency and should be followed up as soon as possible or go to the Emergency Department if any problems should occur.  Please show the CHEMOTHERAPY ALERT CARD or IMMUNOTHERAPY ALERT CARD at check-in to the  Emergency Department and triage nurse.  Should you have questions after your visit or need to cancel or reschedule your appointment, please contact [facility] [phone]  and follow the prompts.  Office hours are 8:00 a.m. to 4:30 p.m. Monday - Friday. Please note that voicemails left after 4:00 p.m. may not be returned until the following business day.  We are closed weekends and major holidays. You have access to a nurse at all times for urgent questions. Please call the main number to the clinic [phone] and follow the prompts.  For any non-urgent questions, you may also contact your provider using MyChart. We now offer e-Visits for anyone 11 and older to request care online for non-urgent symptoms. For details visit [email or web address].   Also download the MyChart app! Go to the app store, search "MyChart", open the app, select [facility], and log in with your MyChart username and password.  Due to Covid, a mask is required upon entering the hospital/clinic. If you do not have a mask, one will be given to you upon arrival. For doctor visits, patients may have 1 support person aged 15 or older with them. For treatment visits, patients cannot have anyone with them due to current Covid guidelines and our immunocompromised population.

## 2021-05-24 NOTE — Progress Notes (Signed)
Patient presents today for 5FU pump stop and disconnection after 46 hour continous infusion.   5FU pump deaccessed.  Patients port flushed without difficulty.  Good blood return noted with no bruising or swelling noted at site.  needle removed intact.  Band aid applied.  VSS with discharge and left in satisfactory ambulatory condition with no s/s of distress noted.

## 2021-05-24 NOTE — Progress Notes (Signed)
Hold Udenyca today.  Oxaliplatin was held with this cycle.  T.O. Dr Rhys Martini, PharmD

## 2021-05-31 ENCOUNTER — Other Ambulatory Visit: Payer: Self-pay

## 2021-05-31 ENCOUNTER — Emergency Department (HOSPITAL_COMMUNITY): Payer: Medicaid Other

## 2021-05-31 ENCOUNTER — Inpatient Hospital Stay (HOSPITAL_COMMUNITY)
Admission: EM | Admit: 2021-05-31 | Discharge: 2021-06-05 | DRG: 872 | Disposition: A | Discharge status: Home / Self Care | Payer: Medicaid Other | Attending: Internal Medicine | Admitting: Internal Medicine

## 2021-05-31 DIAGNOSIS — Z8042 Family history of malignant neoplasm of prostate: Secondary | ICD-10-CM

## 2021-05-31 DIAGNOSIS — I10 Essential (primary) hypertension: Secondary | ICD-10-CM | POA: Diagnosis present

## 2021-05-31 DIAGNOSIS — R1084 Generalized abdominal pain: Secondary | ICD-10-CM

## 2021-05-31 DIAGNOSIS — E876 Hypokalemia: Secondary | ICD-10-CM | POA: Diagnosis present

## 2021-05-31 DIAGNOSIS — E785 Hyperlipidemia, unspecified: Secondary | ICD-10-CM | POA: Diagnosis present

## 2021-05-31 DIAGNOSIS — L270 Generalized skin eruption due to drugs and medicaments taken internally: Secondary | ICD-10-CM | POA: Diagnosis present

## 2021-05-31 DIAGNOSIS — Z79899 Other long term (current) drug therapy: Secondary | ICD-10-CM

## 2021-05-31 DIAGNOSIS — Z28310 Unvaccinated for covid-19: Secondary | ICD-10-CM

## 2021-05-31 DIAGNOSIS — E1140 Type 2 diabetes mellitus with diabetic neuropathy, unspecified: Secondary | ICD-10-CM | POA: Diagnosis present

## 2021-05-31 DIAGNOSIS — K529 Noninfective gastroenteritis and colitis, unspecified: Secondary | ICD-10-CM | POA: Diagnosis present

## 2021-05-31 DIAGNOSIS — C189 Malignant neoplasm of colon, unspecified: Secondary | ICD-10-CM

## 2021-05-31 DIAGNOSIS — T451X5A Adverse effect of antineoplastic and immunosuppressive drugs, initial encounter: Secondary | ICD-10-CM | POA: Diagnosis present

## 2021-05-31 DIAGNOSIS — R112 Nausea with vomiting, unspecified: Secondary | ICD-10-CM

## 2021-05-31 DIAGNOSIS — A09 Infectious gastroenteritis and colitis, unspecified: Secondary | ICD-10-CM | POA: Diagnosis present

## 2021-05-31 DIAGNOSIS — L89310 Pressure ulcer of right buttock, unstageable: Secondary | ICD-10-CM | POA: Diagnosis present

## 2021-05-31 DIAGNOSIS — Z20822 Contact with and (suspected) exposure to covid-19: Secondary | ICD-10-CM | POA: Diagnosis present

## 2021-05-31 DIAGNOSIS — C787 Secondary malignant neoplasm of liver and intrahepatic bile duct: Secondary | ICD-10-CM | POA: Diagnosis present

## 2021-05-31 DIAGNOSIS — G8929 Other chronic pain: Secondary | ICD-10-CM | POA: Diagnosis present

## 2021-05-31 DIAGNOSIS — L89320 Pressure ulcer of left buttock, unstageable: Secondary | ICD-10-CM | POA: Diagnosis present

## 2021-05-31 DIAGNOSIS — G893 Neoplasm related pain (acute) (chronic): Secondary | ICD-10-CM | POA: Diagnosis present

## 2021-05-31 DIAGNOSIS — A419 Sepsis, unspecified organism: Principal | ICD-10-CM | POA: Diagnosis present

## 2021-05-31 LAB — COMPREHENSIVE METABOLIC PANEL
ALT: 16 U/L (ref 0–44)
AST: 32 U/L (ref 15–41)
Albumin: 2.7 g/dL — ABNORMAL LOW (ref 3.5–5.0)
Alkaline Phosphatase: 315 U/L — ABNORMAL HIGH (ref 38–126)
Anion gap: 9 (ref 5–15)
BUN: 17 mg/dL (ref 6–20)
CO2: 22 mmol/L (ref 22–32)
Calcium: 7.8 mg/dL — ABNORMAL LOW (ref 8.9–10.3)
Chloride: 105 mmol/L (ref 98–111)
Creatinine, Ser: 0.55 mg/dL — ABNORMAL LOW (ref 0.61–1.24)
GFR, Estimated: >60 mL/min (ref >60)
Glucose, Bld: 195 mg/dL — ABNORMAL HIGH (ref 70–99)
Potassium: 3.4 mmol/L — ABNORMAL LOW (ref 3.5–5.1)
Sodium: 136 mmol/L (ref 135–145)
Total Bilirubin: 1.2 mg/dL (ref 0.3–1.2)
Total Protein: 5.2 g/dL — ABNORMAL LOW (ref 6.5–8.1)

## 2021-05-31 LAB — CBC
HCT: 33 % — ABNORMAL LOW (ref 39.0–52.0)
Hemoglobin: 11.2 g/dL — ABNORMAL LOW (ref 13.0–17.0)
MCH: 31.8 pg (ref 26.0–34.0)
MCHC: 33.9 g/dL (ref 30.0–36.0)
MCV: 93.8 fL (ref 80.0–100.0)
Platelets: 132 10*3/uL — ABNORMAL LOW (ref 150–400)
RBC: 3.52 MIL/uL — ABNORMAL LOW (ref 4.22–5.81)
RDW: 18.5 % — ABNORMAL HIGH (ref 11.5–15.5)
WBC: 15.2 10*3/uL — ABNORMAL HIGH (ref 4.0–10.5)
nRBC: 0 % (ref 0.0–0.2)

## 2021-05-31 LAB — LIPASE, BLOOD: Lipase: 30 U/L (ref 11–51)

## 2021-05-31 LAB — MAGNESIUM: Magnesium: 2 mg/dL (ref 1.7–2.4)

## 2021-05-31 MED ORDER — ONDANSETRON HCL 4 MG/2ML IJ SOLN
4.0000 mg | Freq: Once | INTRAMUSCULAR | Status: AC
  Administered 2021-05-31: 4 mg via INTRAVENOUS
  Filled 2021-05-31: qty 2

## 2021-05-31 MED ORDER — MORPHINE SULFATE (PF) 4 MG/ML IV SOLN
4.0000 mg | Freq: Once | INTRAVENOUS | Status: AC
  Administered 2021-05-31: 4 mg via INTRAVENOUS
  Filled 2021-05-31: qty 1

## 2021-05-31 MED ORDER — SODIUM CHLORIDE 0.9 % IV BOLUS
1000.0000 mL | Freq: Once | INTRAVENOUS | Status: AC
  Administered 2021-05-31: 1000 mL via INTRAVENOUS

## 2021-05-31 MED ORDER — IOHEXOL 300 MG/ML  SOLN
100.0000 mL | Freq: Once | INTRAMUSCULAR | Status: AC | PRN
  Administered 2021-06-01: 100 mL via INTRAVENOUS

## 2021-05-31 NOTE — ED Triage Notes (Signed)
Pt presents for abd pain r/t colon ca, receives chemo, last received chemo last week. Starting today, sudden low abd pain, emesis, weakness.

## 2021-05-31 NOTE — ED Provider Notes (Signed)
[facility] EMERGENCY DEPARTMENT Provider Note   CSN: [id number] Arrival date & time: 05/31/21  1901     History Chief Complaint  Patient presents with   Abdominal Pain    HERMENEGILDO CLAUSEN is a 60 y.o. male.  Pt presents to the ED today with abdominal pain and n/v.  Pt has a hx of colon cancer with mets to his liver.  His last chemo was last week.  Pain started today.  No fever. Pt was unable to get out of bed all day today.      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[facility])     Past Surgical History:  Procedure Laterality Date   BIOPSY  01/13/2021   Procedure: BIOPSY;  Surgeon: Harvel Quale, MD;  Location: [facility] ENDO SUITE;  Service: Gastroenterology;;   CATARACT EXTRACTION Right    COLONOSCOPY WITH PROPOFOL N/A 01/13/2021   Procedure: COLONOSCOPY WITH PROPOFOL;  Surgeon: Harvel Quale, MD;  Location: [facility] ENDO SUITE;  Service: Gastroenterology;  Laterality: N/A;  11:05   ESOPHAGOGASTRODUODENOSCOPY (EGD) WITH PROPOFOL N/A 01/13/2021   Procedure: ESOPHAGOGASTRODUODENOSCOPY (EGD) WITH PROPOFOL;  Surgeon: Harvel Quale, MD;  Location: [facility] ENDO SUITE;  Service: Gastroenterology;  Laterality: N/A;   IR IMAGING GUIDED PORT INSERTION  01/24/2021   LIVER BIOPSY  01/10/2021   U/S guided   POLYPECTOMY  01/13/2021   Procedure: POLYPECTOMY;  Surgeon: Harvel Quale, MD;  Location: [facility];  Service: Gastroenterology;;       Family History  Problem Relation Age of Onset   Cancer Father 48       prostate ca    Social History   Tobacco Use   Smoking  status: Never   Smokeless tobacco: Never  Vaping Use   Vaping Use: Never used  Substance Use Topics   Alcohol use: Not Currently   Drug use: Not Currently    Home Medications Prior to Admission medications   Medication Sig Start Date End Date Taking? Authorizing Provider  acetaminophen (TYLENOL) 500 MG tablet Take 1,000 mg by mouth every 6 (six) hours as needed for moderate pain.   Yes [provider]  atenolol (TENORMIN) 50 MG tablet Take 50 mg by mouth daily. 11/07/20  Yes [provider]  clindamycin (CLEOCIN T) 1 % SWAB Apply 1 application topically 2 (two) times daily. To anal area 05/15/21  Yes Derek Jack, MD  clobetasol cream (TEMOVATE) 3.47 % Apply 1 application topically 2 (two) times daily. Apply to both hands twice a day 04/20/21   Derek Jack, MD  clobetasol ointment (TEMOVATE) 0.05 % Apply twice a day on affected areas for 2 weeks, then once a day for two weeks, after that use every other day 04/05/21   [provider]  doxycycline (VIBRA-TABS) 100 MG tablet Take 1 tablet (100 mg total) by mouth 2 (two) times daily. 02/08/21   Derek Jack, MD  fluorouracil CALGB 42595 2,400 mg/m2 in sodium chloride 0.9 % 150 mL Inject 2,400 mg/m2 into the vein over 48 hr.    [provider]  furosemide (LASIX) 20 MG tablet Take  1 tablet (20 mg total) by mouth daily as needed. 05/22/21   Derek Jack, MD  hydrocortisone 1 % ointment Apply 1 application topically 2 (two) times daily. 02/08/21   Derek Jack, MD  hydrocortisone-pramoxine (PROCTOFOAM [facility]) rectal foam Place 1 applicator rectally 2 (two) times daily. 04/26/21   Derek Jack, MD  insulin NPH-regular Human (70-30) 100 UNIT/ML injection Inject 10-12 Units into the skin 2 (two) times daily.    [provider]  Lactulose 20 GM/30ML SOLN Take 30 mLs (20 g total) by mouth in the morning. 04/21/21   Derek Jack, MD  LEUCOVORIN CALCIUM IV Inject 400  mg/m2 into the vein every 14 (fourteen) days. 01/25/21   [provider]  lidocaine (XYLOCAINE) 5 % ointment Apply 1 application topically in the morning and at bedtime. Apply to both hands twice a day 04/20/21   Derek Jack, MD  Lidocaine, Anorectal, 5 % CREA Apply 1 application topically 2 (two) times daily as needed. 05/02/21   Derek Jack, MD  lidocaine-prilocaine (EMLA) cream Apply to affected area once 01/18/21   Heath Lark, MD  LORazepam (ATIVAN) 0.5 MG tablet Take 1 tablet by mouth twice daily as needed for anxiety 03/20/21   Derek Jack, MD  magnesium oxide (MAG-OX) 400 (240 Mg) MG tablet Take 1 tablet (400 mg total) by mouth 2 (two) times daily. 05/08/21   Derek Jack, MD  morphine (MSIR) 15 MG tablet Take 1 tablet (15 mg total) by mouth 2 (two) times daily as needed for severe pain. 04/12/21   Derek Jack, MD  Multiple Vitamin (MULTIVITAMIN WITH MINERALS) TABS tablet Take 1 tablet by mouth daily.    [provider]  omeprazole (PRILOSEC) 40 MG capsule Take 1 capsule (40 mg total) by mouth daily. 01/16/21   Harvel Quale, MD  ondansetron (ZOFRAN) 8 MG tablet Take 1 tablet (8 mg total) by mouth every 8 (eight) hours as needed for nausea. 02/27/21   Derek Jack, MD  OXALIPLATIN IV Inject 85 mg/m2 into the vein every 14 (fourteen) days. 01/25/21   [provider]  pentoxifylline (TRENTAL) 400 MG CR tablet Take by mouth. 04/05/21   [provider]  polyethylene glycol (MIRALAX) 17 g packet Take 17 g by mouth daily. 01/04/21   Heath Lark, MD  Potassium Chloride ER (K-TAB) 20 MEQ TBCR Take 20 mEq by mouth daily. 05/10/21   Derek Jack, MD  prochlorperazine (COMPAZINE) 10 MG tablet Take 1 tablet by mouth every 6 (six) hours as needed. 01/04/21   [provider]  silver sulfADIAZINE (SILVADENE) 1 % cream Apply 1 application topically daily. 05/22/21   Derek Jack, MD  simvastatin  (ZOCOR) 20 MG tablet SMARTSIG:1 Tablet(s) By Mouth Every Evening 01/23/21   [provider]  sucralfate (CARAFATE) 1 GM/10ML suspension SMARTSIG:Milliliter(s) By Mouth 03/15/21   [provider]  temazepam (RESTORIL) 15 MG capsule TAKE 1 CAPSULE BY MOUTH AT BEDTIME AS NEEDED FOR SLEEP 02/14/21   Derek Jack, MD    Allergies    Patient has no known allergies.  Review of Systems   Review of Systems  Gastrointestinal:  Positive for abdominal pain, nausea and vomiting.  All other systems reviewed and are negative.  Physical Exam Updated Vital Signs BP 101/66   Pulse (!) 118   Temp 97.6 F (36.4 C) (Oral)   Resp 14   SpO2 97%   Physical Exam Vitals and nursing note reviewed.  Constitutional:      Appearance: He is well-developed.  HENT:  Head: Normocephalic and atraumatic.     Mouth/Throat:     Mouth: Mucous membranes are dry.  Cardiovascular:     Rate and Rhythm: Normal rate and regular rhythm.     Heart sounds: Normal heart sounds.  Pulmonary:     Effort: Pulmonary effort is normal.     Breath sounds: Normal breath sounds.  Abdominal:     General: Bowel sounds are decreased. There is distension.     Tenderness: There is generalized abdominal tenderness.  Skin:    General: Skin is warm.     Capillary Refill: Capillary refill takes less than 2 seconds.  Neurological:     General: No focal deficit present.     Mental Status: He is alert and oriented to person, place, and time.  Psychiatric:        Mood and Affect: Mood normal.        Behavior: Behavior normal.    ED Results / Procedures / Treatments   Labs (all labs ordered are listed, but only abnormal results are displayed) Labs Reviewed  COMPREHENSIVE METABOLIC PANEL - Abnormal; Notable for the following components:      Result Value   Potassium 3.4 (*)    Glucose, Bld 195 (*)    Creatinine, Ser 0.55 (*)    Calcium 7.8 (*)    Total Protein 5.2 (*)    Albumin 2.7 (*)    Alkaline  Phosphatase 315 (*)    All other components within normal limits  CBC - Abnormal; Notable for the following components:   WBC 15.2 (*)    RBC 3.52 (*)    Hemoglobin 11.2 (*)    HCT 33.0 (*)    RDW 18.5 (*)    Platelets 132 (*)    All other components within normal limits  LIPASE, BLOOD  MAGNESIUM  URINALYSIS, ROUTINE W REFLEX MICROSCOPIC    EKG EKG Interpretation  Date/Time:  Wednesday May 31 2021 19:21:51 EST Ventricular Rate:  96 PR Interval:  132 QRS Duration: 111 QT Interval:  389 QTC Calculation: 492 R Axis:   89 Text Interpretation: Sinus rhythm Probable left atrial enlargement Low voltage, extremity and precordial leads Borderline prolonged QT interval No significant change since last tracing Confirmed by Isla Pence [phone]) on 05/31/2021 11:05:20 PM  Radiology No results found.  Procedures Procedures   Medications Ordered in ED Medications  iohexol (OMNIPAQUE) 300 MG/ML solution 100 mL (has no administration in time range)  sodium chloride 0.9 % bolus 1,000 mL (0 mLs Intravenous Stopped 05/31/21 2254)  morphine 4 MG/ML injection 4 mg (4 mg Intravenous Given 05/31/21 2046)  ondansetron (ZOFRAN) injection 4 mg (4 mg Intravenous Given 05/31/21 2046)  sodium chloride 0.9 % bolus 1,000 mL (1,000 mLs Intravenous New Bag/Given 05/31/21 2254)  morphine 4 MG/ML injection 4 mg (4 mg Intravenous Given 05/31/21 2321)    ED Course  I have reviewed the triage vital signs and the nursing notes.  Pertinent labs & imaging results that were available during my care of the patient were reviewed by me and considered in my medical decision making (see chart for details).    MDM Rules/Calculators/A&P                           Pt's labs show an elevation in WBC.  CT abd/pelvis pending at shift change.  Pt signed out to Dr. Sedonia Small.  Final Clinical Impression(s) / ED Diagnoses Final diagnoses:  Generalized abdominal pain  Nausea and vomiting, unspecified vomiting type   Metastatic colon cancer to liver [facility])    Rx / DC Orders ED Discharge Orders     None        Isla Pence, MD 05/31/21 2327

## 2021-06-01 ENCOUNTER — Encounter (HOSPITAL_COMMUNITY): Payer: Self-pay | Admitting: Family Medicine

## 2021-06-01 ENCOUNTER — Other Ambulatory Visit: Payer: Self-pay

## 2021-06-01 DIAGNOSIS — E876 Hypokalemia: Secondary | ICD-10-CM | POA: Diagnosis present

## 2021-06-01 DIAGNOSIS — E1140 Type 2 diabetes mellitus with diabetic neuropathy, unspecified: Secondary | ICD-10-CM | POA: Diagnosis present

## 2021-06-01 DIAGNOSIS — G8929 Other chronic pain: Secondary | ICD-10-CM | POA: Diagnosis present

## 2021-06-01 DIAGNOSIS — L89310 Pressure ulcer of right buttock, unstageable: Secondary | ICD-10-CM | POA: Diagnosis present

## 2021-06-01 DIAGNOSIS — L270 Generalized skin eruption due to drugs and medicaments taken internally: Secondary | ICD-10-CM | POA: Diagnosis present

## 2021-06-01 DIAGNOSIS — T451X5A Adverse effect of antineoplastic and immunosuppressive drugs, initial encounter: Secondary | ICD-10-CM | POA: Diagnosis present

## 2021-06-01 DIAGNOSIS — E785 Hyperlipidemia, unspecified: Secondary | ICD-10-CM | POA: Diagnosis present

## 2021-06-01 DIAGNOSIS — G893 Neoplasm related pain (acute) (chronic): Secondary | ICD-10-CM | POA: Diagnosis present

## 2021-06-01 DIAGNOSIS — Z8042 Family history of malignant neoplasm of prostate: Secondary | ICD-10-CM | POA: Diagnosis not present

## 2021-06-01 DIAGNOSIS — K529 Noninfective gastroenteritis and colitis, unspecified: Secondary | ICD-10-CM

## 2021-06-01 DIAGNOSIS — Z20822 Contact with and (suspected) exposure to covid-19: Secondary | ICD-10-CM | POA: Diagnosis present

## 2021-06-01 DIAGNOSIS — L89320 Pressure ulcer of left buttock, unstageable: Secondary | ICD-10-CM | POA: Diagnosis present

## 2021-06-01 DIAGNOSIS — I10 Essential (primary) hypertension: Secondary | ICD-10-CM | POA: Diagnosis present

## 2021-06-01 DIAGNOSIS — A419 Sepsis, unspecified organism: Secondary | ICD-10-CM | POA: Diagnosis present

## 2021-06-01 DIAGNOSIS — C189 Malignant neoplasm of colon, unspecified: Secondary | ICD-10-CM | POA: Diagnosis present

## 2021-06-01 DIAGNOSIS — Z79899 Other long term (current) drug therapy: Secondary | ICD-10-CM | POA: Diagnosis not present

## 2021-06-01 DIAGNOSIS — A09 Infectious gastroenteritis and colitis, unspecified: Secondary | ICD-10-CM | POA: Diagnosis present

## 2021-06-01 DIAGNOSIS — C787 Secondary malignant neoplasm of liver and intrahepatic bile duct: Secondary | ICD-10-CM | POA: Diagnosis present

## 2021-06-01 DIAGNOSIS — Z28310 Unvaccinated for covid-19: Secondary | ICD-10-CM | POA: Diagnosis not present

## 2021-06-01 LAB — URINALYSIS, MICROSCOPIC (REFLEX): Squamous Epithelial / HPF: NONE SEEN (ref 0–5)

## 2021-06-01 LAB — GLUCOSE, CAPILLARY
Glucose-Capillary: 121 mg/dL — ABNORMAL HIGH (ref 70–99)
Glucose-Capillary: 125 mg/dL — ABNORMAL HIGH (ref 70–99)

## 2021-06-01 LAB — BLOOD GAS, VENOUS
Acid-base deficit: 1.6 mmol/L (ref 0.0–2.0)
Bicarbonate: 22.4 mmol/L (ref 20.0–28.0)
FIO2: 21
O2 Saturation: 54.7 %
Patient temperature: 36.5
pCO2, Ven: 37.2 mmHg — ABNORMAL LOW (ref 44.0–60.0)
pH, Ven: 7.398 (ref 7.250–7.430)
pO2, Ven: 31.7 mmHg — CL (ref 32.0–45.0)

## 2021-06-01 LAB — URINALYSIS, ROUTINE W REFLEX MICROSCOPIC
Glucose, UA: 100 mg/dL — AB
Hgb urine dipstick: NEGATIVE
Ketones, ur: NEGATIVE mg/dL
Leukocytes,Ua: NEGATIVE
Nitrite: NEGATIVE
Protein, ur: NEGATIVE mg/dL
Specific Gravity, Urine: >1.03 — ABNORMAL HIGH (ref 1.005–1.030)
pH: 5.5 (ref 5.0–8.0)

## 2021-06-01 LAB — RESP PANEL BY RT-PCR (FLU A&B, COVID) ARPGX2
Influenza A by PCR: NEGATIVE
Influenza B by PCR: NEGATIVE
SARS Coronavirus 2 by RT PCR: NEGATIVE

## 2021-06-01 LAB — HEMOGLOBIN A1C
Hgb A1c MFr Bld: 5 % (ref 4.8–5.6)
Mean Plasma Glucose: 96.8 mg/dL

## 2021-06-01 LAB — CBG MONITORING, ED: Glucose-Capillary: 170 mg/dL — ABNORMAL HIGH (ref 70–99)

## 2021-06-01 MED ORDER — METRONIDAZOLE 500 MG/100ML IV SOLN
500.0000 mg | Freq: Once | INTRAVENOUS | Status: AC
  Administered 2021-06-01: 500 mg via INTRAVENOUS
  Filled 2021-06-01: qty 100

## 2021-06-01 MED ORDER — LORAZEPAM 0.5 MG PO TABS
0.5000 mg | ORAL_TABLET | Freq: Two times a day (BID) | ORAL | Status: DC | PRN
  Administered 2021-06-02 – 2021-06-05 (×3): 0.5 mg via ORAL
  Filled 2021-06-01 (×4): qty 1

## 2021-06-01 MED ORDER — ACETAMINOPHEN 325 MG PO TABS: 650.0000 mg | ORAL_TABLET | Freq: Four times a day (QID) | ORAL | Status: DC | PRN

## 2021-06-01 MED ORDER — PIPERACILLIN-TAZOBACTAM 3.375 G IVPB 30 MIN
3.3750 g | Freq: Once | INTRAVENOUS | Status: AC
  Administered 2021-06-01: 3.375 g via INTRAVENOUS
  Filled 2021-06-01: qty 50

## 2021-06-01 MED ORDER — NOREPINEPHRINE 4 MG/250ML-% IV SOLN
0.0000 ug/min | INTRAVENOUS | Status: DC
  Administered 2021-06-01: 2 ug/min via INTRAVENOUS
  Administered 2021-06-01: 3 ug/min via INTRAVENOUS
  Administered 2021-06-02 (×2): 6 ug/min via INTRAVENOUS
  Administered 2021-06-03: 4 ug/min via INTRAVENOUS
  Filled 2021-06-01 (×5): qty 250

## 2021-06-01 MED ORDER — LACTULOSE 10 GM/15ML PO SOLN
20.0000 g | Freq: Every morning | ORAL | Status: DC
  Administered 2021-06-01 – 2021-06-03 (×2): 20 g via ORAL
  Filled 2021-06-01 (×2): qty 30

## 2021-06-01 MED ORDER — HEPARIN SODIUM (PORCINE) 5000 UNIT/ML IJ SOLN
5000.0000 [IU] | Freq: Three times a day (TID) | INTRAMUSCULAR | Status: DC
  Administered 2021-06-01: 5000 [IU] via SUBCUTANEOUS
  Filled 2021-06-01: qty 1

## 2021-06-01 MED ORDER — METRONIDAZOLE 500 MG/100ML IV SOLN: 500.0000 mg | Freq: Two times a day (BID) | INTRAVENOUS | Status: DC

## 2021-06-01 MED ORDER — MORPHINE SULFATE 15 MG PO TABS: 15.0000 mg | ORAL_TABLET | Freq: Two times a day (BID) | ORAL | Status: DC | PRN

## 2021-06-01 MED ORDER — PIPERACILLIN-TAZOBACTAM 3.375 G IVPB
3.3750 g | Freq: Three times a day (TID) | INTRAVENOUS | Status: DC
  Administered 2021-06-01 – 2021-06-05 (×12): 3.375 g via INTRAVENOUS
  Filled 2021-06-01 (×10): qty 50

## 2021-06-01 MED ORDER — ONDANSETRON HCL 4 MG PO TABS: 4.0000 mg | ORAL_TABLET | Freq: Four times a day (QID) | ORAL | Status: DC | PRN

## 2021-06-01 MED ORDER — CHLORHEXIDINE GLUCONATE CLOTH 2 % EX PADS
6.0000 | MEDICATED_PAD | Freq: Every day | CUTANEOUS | Status: DC
  Administered 2021-06-01 – 2021-06-05 (×5): 6 via TOPICAL

## 2021-06-01 MED ORDER — SODIUM CHLORIDE 0.9 % IV SOLN
2.0000 g | Freq: Once | INTRAVENOUS | Status: AC
  Administered 2021-06-01: 2 g via INTRAVENOUS
  Filled 2021-06-01: qty 20

## 2021-06-01 MED ORDER — LACTATED RINGERS IV BOLUS
1000.0000 mL | Freq: Once | INTRAVENOUS | Status: AC
  Administered 2021-06-01: 1000 mL via INTRAVENOUS

## 2021-06-01 MED ORDER — MORPHINE SULFATE (PF) 2 MG/ML IV SOLN
2.0000 mg | INTRAVENOUS | Status: DC | PRN
  Administered 2021-06-02 – 2021-06-05 (×6): 2 mg via INTRAVENOUS
  Filled 2021-06-01 (×7): qty 1

## 2021-06-01 MED ORDER — OXYCODONE HCL 5 MG PO TABS
5.0000 mg | ORAL_TABLET | ORAL | Status: DC | PRN
  Administered 2021-06-02 – 2021-06-05 (×10): 5 mg via ORAL
  Filled 2021-06-01 (×10): qty 1

## 2021-06-01 MED ORDER — ONDANSETRON HCL 4 MG/2ML IJ SOLN
4.0000 mg | Freq: Four times a day (QID) | INTRAMUSCULAR | Status: DC | PRN
  Administered 2021-06-04: 11:00:00 4 mg via INTRAVENOUS
  Filled 2021-06-01: qty 2

## 2021-06-01 MED ORDER — SODIUM CHLORIDE 0.9 % IV SOLN: INTRAVENOUS | Status: AC

## 2021-06-01 MED ORDER — ACETAMINOPHEN 650 MG RE SUPP: 650.0000 mg | Freq: Four times a day (QID) | RECTAL | Status: DC | PRN

## 2021-06-01 MED ORDER — INSULIN ASPART 100 UNIT/ML IJ SOLN
0.0000 [IU] | Freq: Three times a day (TID) | INTRAMUSCULAR | Status: DC
  Administered 2021-06-01: 2 [IU] via SUBCUTANEOUS
  Administered 2021-06-02: 3 [IU] via SUBCUTANEOUS
  Administered 2021-06-02 – 2021-06-03 (×3): 1 [IU] via SUBCUTANEOUS
  Administered 2021-06-03: 2 [IU] via SUBCUTANEOUS
  Administered 2021-06-04 – 2021-06-05 (×2): 1 [IU] via SUBCUTANEOUS
  Filled 2021-06-01: qty 1

## 2021-06-01 MED ORDER — TEMAZEPAM 7.5 MG PO CAPS: 15.0000 mg | ORAL_CAPSULE | Freq: Every evening | ORAL | Status: DC | PRN

## 2021-06-01 MED ORDER — SIMVASTATIN 10 MG PO TABS: 20.0000 mg | ORAL_TABLET | Freq: Every day | ORAL | Status: DC

## 2021-06-01 MED ORDER — SODIUM CHLORIDE 0.9 % IV SOLN: 1.0000 g | INTRAVENOUS | Status: DC

## 2021-06-01 MED ORDER — POTASSIUM CHLORIDE 10 MEQ/100ML IV SOLN
10.0000 meq | INTRAVENOUS | Status: AC
  Administered 2021-06-01 (×3): 10 meq via INTRAVENOUS
  Filled 2021-06-01 (×3): qty 100

## 2021-06-01 MED ORDER — INSULIN ASPART 100 UNIT/ML IJ SOLN
0.0000 [IU] | Freq: Every day | INTRAMUSCULAR | Status: DC
Start: 2021-06-01 — End: 2021-06-05

## 2021-06-01 NOTE — ED Notes (Signed)
Pt given water and warm blanket

## 2021-06-01 NOTE — Progress Notes (Signed)
Pharmacy Antibiotic Note  JERRAL MCCAULEY is a 60 y.o. male admitted on 05/31/2021 with  intra-abdominal infection .  Pharmacy has been consulted for Zosyn dosing.  Plan: Zosyn 3.375g IV q8h (4 hour infusion). Monitor labs, c/s, and patient improvement.     Temp (24hrs), Avg:97.6 F (36.4 C), Min:97.6 F (36.4 C), Max:97.6 F (36.4 C)  Recent Labs  Lab 05/31/21 2026  WBC 15.2*  CREATININE 0.55*    Estimated Creatinine Clearance: 96.2 mL/min (A) (by C-G formula based on SCr of 0.55 mg/dL (L)).    No Known Allergies  Antimicrobials this admission: Zosyn 11/24 >>   Microbiology results: COVID neg  Thank you for allowing pharmacy to be a part of this patient's care.  Ramond Craver 06/01/2021 8:23 AM

## 2021-06-01 NOTE — ED Provider Notes (Signed)
  Provider Note MRN:  [id number]  Arrival date & time: 06/01/21    ED Course and Medical Decision Making  Assumed care from Dr. Gilford Raid at shift change.  History of colon cancer with mets, on chemo here with nausea vomiting and abdominal pain awaiting CT scan.  CT revealing colitis and worsening ascites, patient with persistent tachycardia and soft blood pressure despite fluids.  No fever, favoring hypovolemia but will cover with antibiotics.  Will admit to medicine  Procedures  Final Clinical Impressions(s) / ED Diagnoses     ICD-10-CM   1. Generalized abdominal pain  R10.84     2. Nausea and vomiting, unspecified vomiting type  R11.2     3. Metastatic colon cancer to liver ([place])  C18.9    C78.7     4. Colitis  K52.9       ED Discharge Orders     None       Discharge Instructions   None     Henry Johns, Henry Johns [email or web address]    Maudie Flakes, MD 06/01/21 [phone]

## 2021-06-01 NOTE — ED Notes (Signed)
Large loose bm removed from pt.

## 2021-06-01 NOTE — H&P (Signed)
TRH H&P    Patient Demographics:    Henry Johns, is a 60 y.o. male  MRN: [id number]  DOB - [birth year]  Admit Date - 05/31/2021  Referring MD/NP/PA: Sedonia Small  Outpatient Primary MD for the patient is [facility]  Patient coming from: Home  Chief complaint- abdominal pain   HPI:    Henry Johns  is a 60 y.o. male, with history of diabetes mellitus type 2, metastatic cancer getting chemo with next chemo being Monday, hypertension, and more presents ED with a chief complaint of abdominal pain.  Patient reports abdominal pain started 2 PM sudden onset.  He had nausea and vomiting x4 that was yellow and orange.  There was no hematemesis.  His last normal meal was on the 22nd.  In the morning.  Patient describes the pain in his abdomen as a tightness/aching.  It is eased up now that he has had pain medication in the ED.  Vomiting did not have any effect on his pain.  Repositioning did not have any effect on his pain.  He reports that he had similar episode in August when he started chemo.  He denies any fevers.  He does have a rash on his hands that has been going on since he started chemo.  Patient has no other complaints at this time.  Patient does not smoke, does not drink, does not use illicit drugs.  Patient is not vaccinated for COVID.  Patient has not given thought to [place] prior to this visit, so he has been advised that we will start with full code and readdress after he has had time to think about it.  In the ED Temp 97.6, heart rate 96-134, respiratory rate 8-15, blood pressure 88/50-94/67 Leukocytosis 15.2, hemoglobin 11.2 Hypokalemia 3.4 Albumin 2.7 CT abdomen shows segmental thickening of the sigmoid colon that may represent colitis or related to underlying infiltrative process Flagyl and Rocephin started in the ED 1 L LR, 2 L normal saline started in the ED Morphine  4 mg x 2 and Zofran given in the ED as well Admission requested for treatment of colitis    Review of systems:    In addition to the HPI above,  No Fever-chills, No Headache, No changes with Vision or hearing, No problems swallowing food or Liquids, No Chest pain, Cough or Shortness of Breath, No Blood in stool or Urine, No dysuria, No new skin rashes or bruises, No new joints pains-aches,  No new weakness, tingling, numbness in any extremity, No recent weight gain or loss, No polyuria, polydypsia or polyphagia, No significant Mental Stressors.  All other systems reviewed and are negative.    Past History of the following :    Past Medical History:  Diagnosis Date   Diabetes mellitus without complication ([place])    Hypertension       Past Surgical History:  Procedure Laterality Date   BIOPSY  01/13/2021   Procedure: BIOPSY;  Surgeon: Harvel Quale, MD;  [facility];  Service: Gastroenterology;;  CATARACT EXTRACTION Right    COLONOSCOPY WITH PROPOFOL N/A 01/13/2021   Procedure: COLONOSCOPY WITH PROPOFOL;  Surgeon: Harvel Quale, MD;  Location[facility];  Service: Gastroenterology;  Laterality: N/A;  11:05   ESOPHAGOGASTRODUODENOSCOPY (EGD) WITH PROPOFOL N/A 01/13/2021   Procedure: ESOPHAGOGASTRODUODENOSCOPY (EGD) WITH PROPOFOL;  Surgeon: Harvel Quale, MD;  [facility];  Service: Gastroenterology;  Laterality: N/A;   IR IMAGING GUIDED PORT INSERTION  01/24/2021   LIVER BIOPSY  01/10/2021   U/S guided   POLYPECTOMY  01/13/2021   Procedure: POLYPECTOMY;  Surgeon: Harvel Quale, MD;  [facility];  Service: Gastroenterology;;      Social History:      Social History   Tobacco Use   Smoking status: Never   Smokeless tobacco: Never  Substance Use Topics   Alcohol use: Not Currently       Family History :     Family History  Problem Relation Age of Onset   Cancer Father 26        prostate ca      Home Medications:   Prior to Admission medications   Medication Sig Start Date End Date Taking? Authorizing Provider  acetaminophen (TYLENOL) 500 MG tablet Take 1,000 mg by mouth every 6 (six) hours as needed for moderate pain.   Yes [provider]  atenolol (TENORMIN) 50 MG tablet Take 50 mg by mouth daily. 11/07/20  Yes [provider]  clindamycin (CLEOCIN T) 1 % SWAB Apply 1 application topically 2 (two) times daily. To anal area 05/15/21  Yes Derek Jack, MD  clobetasol cream (TEMOVATE) 5.17 % Apply 1 application topically 2 (two) times daily. Apply to both hands twice a day 04/20/21   Derek Jack, MD  clobetasol ointment (TEMOVATE) 0.05 % Apply twice a day on affected areas for 2 weeks, then once a day for two weeks, after that use every other day 04/05/21   [provider]  doxycycline (VIBRA-TABS) 100 MG tablet Take 1 tablet (100 mg total) by mouth 2 (two) times daily. 02/08/21   Derek Jack, MD  fluorouracil CALGB 61607 2,400 mg/m2 in sodium chloride 0.9 % 150 mL Inject 2,400 mg/m2 into the vein over 48 hr.    [provider]  furosemide (LASIX) 20 MG tablet Take 1 tablet (20 mg total) by mouth daily as needed. 05/22/21   Derek Jack, MD  hydrocortisone 1 % ointment Apply 1 application topically 2 (two) times daily. 02/08/21   Derek Jack, MD  hydrocortisone-pramoxine (PROCTOFOAM [facility]) rectal foam Place 1 applicator rectally 2 (two) times daily. 04/26/21   Derek Jack, MD  insulin NPH-regular Human (70-30) 100 UNIT/ML injection Inject 10-12 Units into the skin 2 (two) times daily.    [provider]  Lactulose 20 GM/30ML SOLN Take 30 mLs (20 g total) by mouth in the morning. 04/21/21   Derek Jack, MD  LEUCOVORIN CALCIUM IV Inject 400 mg/m2 into the vein every 14 (fourteen) days. 01/25/21   [provider]  lidocaine (XYLOCAINE) 5 % ointment Apply 1 application  topically in the morning and at bedtime. Apply to both hands twice a day 04/20/21   Derek Jack, MD  Lidocaine, Anorectal, 5 % CREA Apply 1 application topically 2 (two) times daily as needed. 05/02/21   Derek Jack, MD  lidocaine-prilocaine (EMLA) cream Apply to affected area once 01/18/21   Heath Lark, MD  LORazepam (ATIVAN) 0.5 MG tablet Take 1 tablet by mouth twice daily as needed for  anxiety 03/20/21   Derek Jack, MD  magnesium oxide (MAG-OX) 400 (240 Mg) MG tablet Take 1 tablet (400 mg total) by mouth 2 (two) times daily. 05/08/21   Derek Jack, MD  morphine (MSIR) 15 MG tablet Take 1 tablet (15 mg total) by mouth 2 (two) times daily as needed for severe pain. 04/12/21   Derek Jack, MD  Multiple Vitamin (MULTIVITAMIN WITH MINERALS) TABS tablet Take 1 tablet by mouth daily.    [provider]  omeprazole (PRILOSEC) 40 MG capsule Take 1 capsule (40 mg total) by mouth daily. 01/16/21   Harvel Quale, MD  ondansetron (ZOFRAN) 8 MG tablet Take 1 tablet (8 mg total) by mouth every 8 (eight) hours as needed for nausea. 02/27/21   Derek Jack, MD  OXALIPLATIN IV Inject 85 mg/m2 into the vein every 14 (fourteen) days. 01/25/21   [provider]  pentoxifylline (TRENTAL) 400 MG CR tablet Take by mouth. 04/05/21   [provider]  polyethylene glycol (MIRALAX) 17 g packet Take 17 g by mouth daily. 01/04/21   Heath Lark, MD  Potassium Chloride ER (K-TAB) 20 MEQ TBCR Take 20 mEq by mouth daily. 05/10/21   Derek Jack, MD  prochlorperazine (COMPAZINE) 10 MG tablet Take 1 tablet by mouth every 6 (six) hours as needed. 01/04/21   [provider]  silver sulfADIAZINE (SILVADENE) 1 % cream Apply 1 application topically daily. 05/22/21   Derek Jack, MD  simvastatin (ZOCOR) 20 MG tablet SMARTSIG:1 Tablet(s) By Mouth Every Evening 01/23/21   [provider]  sucralfate (CARAFATE) 1 GM/10ML  suspension SMARTSIG:Milliliter(s) By Mouth 03/15/21   [provider]  temazepam (RESTORIL) 15 MG capsule TAKE 1 CAPSULE BY MOUTH AT BEDTIME AS NEEDED FOR SLEEP 02/14/21   Derek Jack, MD     Allergies:    No Known Allergies   Physical Exam:   Vitals  Blood pressure 99/79, pulse (!) 116, temperature 97.6 F (36.4 C), temperature source Oral, resp. rate 14, SpO2 99 %.   1.  General: Patient lying supine in bed,  no acute distress   2. Psychiatric: Alert and oriented x 3, mood and behavior normal for situation, pleasant and cooperative with exam   3. Neurologic: Speech and language are normal, face is symmetric, moves all 4 extremities voluntarily, at baseline without acute deficits on limited exam   4. HEENMT:  Head is atraumatic, normocephalic, pupils reactive to light, neck is supple, trachea is midline, mucous membranes are moist   5. Respiratory : Lungs are clear to auscultation bilaterally without wheezing, rhonchi, rales, no cyanosis, no increase in work of breathing or accessory muscle use   6. Cardiovascular : Heart rate normal, rhythm is regular, no murmurs, rubs or gallops, no peripheral edema, peripheral pulses palpated   7. Gastrointestinal:  Abdomen is taut, moderately distended, minimally tender with palpation, bowel sounds active, no masses or organomegaly palpated   8. Skin:  Erythematous and excoriated rash on dorsal aspect of bilateral hands   9.Musculoskeletal:  No acute deformities or trauma, no asymmetry in tone, no peripheral edema, peripheral pulses palpated, no tenderness to palpation in the extremities     Data Review:    CBC Recent Labs  Lab 05/31/21 2026  WBC 15.2*  HGB 11.2*  HCT 33.0*  PLT 132*  MCV 93.8  MCH 31.8  MCHC 33.9  RDW 18.5*   ------------------------------------------------------------------------------------------------------------------  Results for orders placed or performed during the hospital  encounter of 05/31/21 (from the past 48 hour(s))  Lipase, blood     Status: None   Collection Time: 05/31/21  8:26 PM  Result Value Ref Range   Lipase 30 11 - 51 U/L    Comment: Performed at [facility], [place], [address], [place], [place] [address]  Comprehensive metabolic panel     Status: Abnormal   Collection Time: 05/31/21  8:26 PM  Result Value Ref Range   Sodium 136 135 - 145 mmol/L   Potassium 3.4 (L) 3.5 - 5.1 mmol/L   Chloride 105 98 - 111 mmol/L   CO2 22 22 - 32 mmol/L   Glucose, Bld 195 (H) 70 - 99 mg/dL    Comment: Glucose reference range applies only to samples taken after fasting for at least 8 hours.   BUN 17 6 - 20 mg/dL   Creatinine, Ser 0.55 (L) 0.61 - 1.24 mg/dL   Calcium 7.8 (L) 8.9 - 10.3 mg/dL   Total Protein 5.2 (L) 6.5 - 8.1 g/dL   Albumin 2.7 (L) 3.5 - 5.0 g/dL   AST 32 15 - 41 U/L   ALT 16 0 - 44 U/L   Alkaline Phosphatase 315 (H) 38 - 126 U/L   Total Bilirubin 1.2 0.3 - 1.2 mg/dL   GFR, Estimated >60 >60 mL/min    Comment: (NOTE) Calculated using the CKD-EPI Creatinine Equation (2021)    Anion gap 9 5 - 15    Comment: Performed at [facility], [address], [place], [place] [address]  CBC     Status: Abnormal   Collection Time: 05/31/21  8:26 PM  Result Value Ref Range   WBC 15.2 (H) 4.0 - 10.5 K/uL   RBC 3.52 (L) 4.22 - 5.81 MIL/uL   Hemoglobin 11.2 (L) 13.0 - 17.0 g/dL   HCT 33.0 (L) 39.0 - 52.0 %   MCV 93.8 80.0 - 100.0 fL   MCH 31.8 26.0 - 34.0 pg   MCHC 33.9 30.0 - 36.0 g/dL   RDW 18.5 (H) 11.5 - 15.5 %   Platelets 132 (L) 150 - 400 K/uL   nRBC 0.0 0.0 - 0.2 %    Comment: Performed at [facility], [address] [place], [place] [address]  Magnesium     Status: None   Collection Time: 05/31/21  8:26 PM  Result Value Ref Range   Magnesium 2.0 1.7 - 2.4 mg/dL    Comment: Performed at [facility], [address] [place], [place] [address]  Resp Panel by RT-PCR (Flu A&B, Covid) Nasopharyngeal Swab     Status: None   Collection Time:  06/01/21  2:31 AM   Specimen: Nasopharyngeal Swab; Nasopharyngeal(NP) swabs in vial transport medium  Result Value Ref Range   SARS Coronavirus 2 by RT PCR NEGATIVE NEGATIVE    Comment: (NOTE) SARS-CoV-2 target nucleic acids are NOT DETECTED.  The SARS-CoV-2 RNA is generally detectable in upper respiratory specimens during the acute phase of infection. The lowest concentration of SARS-CoV-2 viral copies this assay can detect is 138 copies/mL. A negative result does not preclude SARS-Cov-2 infection and should not be used as the sole basis for treatment or other patient management decisions. A negative result may occur with  improper specimen collection/handling, submission of specimen other than nasopharyngeal swab, presence of viral mutation(s) within the areas targeted by this assay, and inadequate number of viral copies(<138 copies/mL). A negative result must be combined with clinical observations, patient history, and epidemiological information. The expected result is Negative.  Fact Sheet for Patients:  EntrepreneurPulse.com.au  Fact  Sheet for Healthcare Providers:  IncredibleEmployment.be  This test is no t yet approved or cleared by the [place] FDA and  has been authorized for detection and/or diagnosis of SARS-CoV-2 by FDA under an Emergency Use Authorization (EUA). This EUA will remain  in effect (meaning this test can be used) for the duration of the COVID-19 declaration under Section 564(b)(1) of the Act, 21 U.S.C.section 360bbb-3(b)(1), unless the authorization is terminated  or revoked sooner.       Influenza A by PCR NEGATIVE NEGATIVE   Influenza B by PCR NEGATIVE NEGATIVE    Comment: (NOTE) The Xpert Xpress SARS-CoV-2/FLU/RSV plus assay is intended as an aid in the diagnosis of influenza from Nasopharyngeal swab specimens and should not be used as a sole basis for treatment. Nasal washings and aspirates are  unacceptable for Xpert Xpress SARS-CoV-2/FLU/RSV testing.  Fact Sheet for Patients: EntrepreneurPulse.com.au  Fact Sheet for Healthcare Providers: IncredibleEmployment.be  This test is not yet approved or cleared by the [place] FDA and has been authorized for detection and/or diagnosis of SARS-CoV-2 by FDA under an Emergency Use Authorization (EUA). This EUA will remain in effect (meaning this test can be used) for the duration of the COVID-19 declaration under Section 564(b)(1) of the Act, 21 U.S.C. section 360bbb-3(b)(1), unless the authorization is terminated or revoked.  Performed at [facility], [address] [place], [place] [address]   Urinalysis, Routine w reflex microscopic Urine, Clean Catch     Status: Abnormal   Collection Time: 06/01/21  2:35 AM  Result Value Ref Range   Color, Urine AMBER (A) YELLOW    Comment: BIOCHEMICALS MAY BE AFFECTED BY COLOR   APPearance CLEAR CLEAR   Specific Gravity, Urine >1.030 (H) 1.005 - 1.030   pH 5.5 5.0 - 8.0   Glucose, UA 100 (A) NEGATIVE mg/dL   Hgb urine dipstick NEGATIVE NEGATIVE   Bilirubin Urine MODERATE (A) NEGATIVE   Ketones, ur NEGATIVE NEGATIVE mg/dL   Protein, ur NEGATIVE NEGATIVE mg/dL   Nitrite NEGATIVE NEGATIVE   Leukocytes,Ua NEGATIVE NEGATIVE    Comment: Microscopic not done on urines with negative protein, blood, leukocytes, nitrite, or glucose < 500 mg/dL. Performed at [facility], [address], [place], [place] [address]   Urinalysis, Microscopic (reflex)     Status: Abnormal   Collection Time: 06/01/21  2:35 AM  Result Value Ref Range   RBC / HPF 0-5 0 - 5 RBC/hpf   WBC, UA 0-5 0 - 5 WBC/hpf   Bacteria, UA RARE (A) NONE SEEN    Comment: RARE   Squamous Epithelial / LPF NONE SEEN 0 - 5    Comment: Performed at [facility], [address], [place], [place] [address]  Blood gas, venous     Status: Abnormal   Collection Time: 06/01/21  4:11 AM  Result Value Ref  Range   FIO2 21.00    pH, Ven 7.398 7.250 - 7.430   pCO2, Ven 37.2 (L) 44.0 - 60.0 mmHg   pO2, Ven 31.7 (LL) 32.0 - 45.0 mmHg    Comment: CRITICAL RESULT CALLED TO, READ BACK BY AND VERIFIED WITH: Talbott,[id number]  by Zigmund Daniel, b 11.24.22    Bicarbonate 22.4 20.0 - 28.0 mmol/L   Acid-base deficit 1.6 0.0 - 2.0 mmol/L   O2 Saturation 54.7 %   Patient temperature 36.5    Collection site PORTA CATH    Drawn by C.Turner    Sample type VENOUS     Comment: Performed at [facility],  [address] [place], [place] [address]    Chemistries  Recent Labs  Lab 05/31/21 2026  NA 136  K 3.4*  CL 105  CO2 22  GLUCOSE 195*  BUN 17  CREATININE 0.55*  CALCIUM 7.8*  MG 2.0  AST 32  ALT 16  ALKPHOS 315*  BILITOT 1.2   ------------------------------------------------------------------------------------------------------------------  ------------------------------------------------------------------------------------------------------------------ GFR: Estimated Creatinine Clearance: 96.2 mL/min (A) (by C-G formula based on SCr of 0.55 mg/dL (L)). Liver Function Tests: Recent Labs  Lab 05/31/21 2026  AST 32  ALT 16  ALKPHOS 315*  BILITOT 1.2  PROT 5.2*  ALBUMIN 2.7*   Recent Labs  Lab 05/31/21 2026  LIPASE 30   No results for input(s): AMMONIA in the last 168 hours. Coagulation Profile: No results for input(s): INR, PROTIME in the last 168 hours. Cardiac Enzymes: No results for input(s): CKTOTAL, CKMB, CKMBINDEX, TROPONINI in the last 168 hours. BNP (last 3 results) No results for input(s): PROBNP in the last 8760 hours. HbA1C: No results for input(s): HGBA1C in the last 72 hours. CBG: No results for input(s): GLUCAP in the last 168 hours. Lipid Profile: No results for input(s): CHOL, HDL, LDLCALC, TRIG, CHOLHDL, LDLDIRECT in the last 72 hours. Thyroid Function Tests: No results for input(s): TSH, T4TOTAL, FREET4, T3FREE, THYROIDAB in the last 72 hours. Anemia  Panel: No results for input(s): VITAMINB12, FOLATE, FERRITIN, TIBC, IRON, RETICCTPCT in the last 72 hours.  --------------------------------------------------------------------------------------------------------------- Urine analysis:    Component Value Date/Time   COLORURINE AMBER (A) 06/01/2021 0235   APPEARANCEUR CLEAR 06/01/2021 0235   LABSPEC >1.030 (H) 06/01/2021 0235   PHURINE 5.5 06/01/2021 0235   GLUCOSEU 100 (A) 06/01/2021 0235   HGBUR NEGATIVE 06/01/2021 0235   BILIRUBINUR MODERATE (A) 06/01/2021 0235   KETONESUR NEGATIVE 06/01/2021 0235   PROTEINUR NEGATIVE 06/01/2021 0235   NITRITE NEGATIVE 06/01/2021 0235   LEUKOCYTESUR NEGATIVE 06/01/2021 0235      Imaging Results:    CT ABDOMEN PELVIS W CONTRAST  Result Date: 06/01/2021 CLINICAL DATA:  Abdominal pain.  Metastatic colon cancer. EXAM: CT ABDOMEN AND PELVIS WITH CONTRAST TECHNIQUE: Multidetector CT imaging of the abdomen and pelvis was performed using the standard protocol following bolus administration of intravenous contrast. CONTRAST:  187mL OMNIPAQUE IOHEXOL 300 MG/ML  SOLN COMPARISON:  CT of the chest abdomen pelvis dated 04/19/2021. FINDINGS: Lower chest: There is a 5 mm nodule in the right middle lobe. Right lung base atelectasis. The visualized left lung is clear. Partially visualized central venous line with tip at the cavoatrial junction. No intra-abdominal free air. Moderate ascites, increased since the prior CT. Hepatobiliary: Multiple hepatic metastatic disease, relatively similar to prior CT. There is irregularity of the liver contour which may represent cirrhosis or pseudo cirrhosis. No intrahepatic biliary dilatation. The gallbladder is unremarkable. Pancreas: Unremarkable. No pancreatic ductal dilatation or surrounding inflammatory changes. Spleen: Normal in size without focal abnormality. Adrenals/Urinary Tract: The adrenal glands unremarkable. There is a 1 cm right renal cyst. There is no hydronephrosis on  either side. There is symmetric enhancement and excretion of contrast by both kidneys. The urinary bladder is grossly unremarkable. Stomach/Bowel: There is moderate stool throughout the colon. There is segmental thickening of the sigmoid colon which may represent colitis or related to underlying infiltrative process. There is no bowel obstruction. The appendix is unremarkable. Vascular/Lymphatic: Mild aortoiliac atherosclerotic disease. The IVC is unremarkable. No portal venous gas. No definite adenopathy. Reproductive: The prostate and seminal vesicles are grossly unremarkable. Other: Diffuse subcutaneous edema and anasarca, new  since the prior CT. Musculoskeletal: Degenerative changes of the spine. No acute osseous pathology. IMPRESSION: 1. Segmental thickening of the sigmoid colon may represent colitis or related to underlying infiltrative process. No bowel obstruction. Normal appendix. 2. Moderate ascites, increased since the prior CT. 3. Multiple hepatic metastatic disease, relatively similar to prior CT. 4. Diffuse subcutaneous edema and anasarca, new since the prior CT. 5. There is a 5 mm nodule in the right middle lobe. 6. Aortic Atherosclerosis (ICD10-I70.0). Electronically Signed   By: Anner Crete M.D.   On: 06/01/2021 01:24    My personal review of EKG: Rhythm NSR, Rate 96/min, QTc 492 ,no Acute ST changes   Assessment & Plan:    Principal Problem:   Colitis Active Problems:   Diabetes mellitus with neuropathy (HCC)   Hypokalemia   Hyperlipidemia   Chronic pain   Colitis As seen on CT, symptomatic with abdominal pain and nausea and vomiting, leukocytosis 15.2 Continue Flagyl and Rocephin Clear liquids Pain control Continue to monitor Diabetes mellitus type 2 with neuropathy Holding home insulin Continue sliding scale coverage Continue to monitor Chronic pain Secondary to neoplasm Continue home pain medications Continue as needed morphine IV Continue to  monitor Hypokalemia Secondary to GI losses and poor p.o. intake Replace and recheck Hyperlipidemia Continue statin Rash Secondary to chemo Continue steroid cream    DVT Prophylaxis-   Heparin - SCDs   AM Labs Ordered, also please review Full Orders  Family Communication: Admission, patients condition and plan of care including tests being ordered have been discussed with the patient and brother who indicate understanding and agree with the plan and Code Status.  Code Status: Full  Admission status: Inpatient :The appropriate admission status for this patient is INPATIENT. Inpatient status is judged to be reasonable and necessary in order to provide the required intensity of service to ensure the patient's safety. The patient's presenting symptoms, physical exam findings, and initial radiographic and laboratory data in the context of their chronic comorbidities is felt to place them at high risk for further clinical deterioration. Furthermore, it is not anticipated that the patient will be medically stable for discharge from the hospital within 2 midnights of admission. The following factors support the admission status of inpatient.     The patient's presenting symptoms include abdominal pain. The worrisome physical exam findings include abdominal tenderness and bloating. The initial radiographic and laboratory data are worrisome because of colitis. The chronic co-morbidities include cancer with mets, diabetes mellitus type 2, hyperlipidemia.       * I certify that at the point of admission it is my clinical judgment that the patient will require inpatient hospital care spanning beyond 2 midnights from the point of admission due to high intensity of service, high risk for further deterioration and high frequency of surveillance required.*  Disposition: Anticipated Discharge date 48 - 72 hours Discharge to home  Time spent in minutes : [place] DO

## 2021-06-01 NOTE — ED Notes (Signed)
Pt sits up to drink or take medication and blood pressure drops. Pt verbalized he feels a little dizzy when her sits up.

## 2021-06-01 NOTE — ED Notes (Signed)
Dr. Ghosh-Zierle in with pt at this time 

## 2021-06-01 NOTE — ED Notes (Signed)
Date and time results received: 06/01/21 0420 (use smartphrase ".now" to insert current time)  Test: PO2 Critical Value: 31.7  Name of Provider Notified: Dr. Clearence Ped   Orders Received? Or Actions Taken?:  no/na

## 2021-06-01 NOTE — Progress Notes (Signed)
Henry Johns  is a 60 y.o. male, with history of diabetes mellitus type 2, metastatic cancer getting chemo with next chemo being Monday, hypertension, and more presents ED with a chief complaint of abdominal pain.  He has been admitted with colitis noted on CT abdomen and pelvis and was started on Flagyl and Rocephin for coverage.  He has been started on clear liquids.  Unfortunately, he is becoming more hypotensive and currently requires norepinephrine for blood pressure support.  He continues to remain on chemotherapy every other week for his metastatic colon cancer.  Sepsis secondary to sigmoid colitis -Continue on IV Zosyn for empiric coverage -Clear liquid diet and advance as tolerated -Pain management as ordered -Follow CBC  Hypotension -Possibly related to sepsis versus circulatory shock -Norepinephrine with close monitoring -Holding home as needed Lasix, holding atenolol  Hypokalemia -IV repletion ordered -Recheck in a.m.  Type 2 diabetes with neuropathy -SSI -Holding home insulin for now  Chronic pain secondary to cancer history -Continue home pain medications -IV morphine to be used judiciously with hypotension  Dyslipidemia -Continue statin  Chemo induced rash -Steroid cream  Total care time: 30 minutes.

## 2021-06-01 NOTE — ED Notes (Signed)
Ginger ale given to pt.  

## 2021-06-02 DIAGNOSIS — K529 Noninfective gastroenteritis and colitis, unspecified: Secondary | ICD-10-CM | POA: Diagnosis not present

## 2021-06-02 LAB — CBC WITH DIFFERENTIAL/PLATELET
Abs Immature Granulocytes: 0.13 10*3/uL — ABNORMAL HIGH (ref 0.00–0.07)
Basophils Absolute: 0.1 10*3/uL (ref 0.0–0.1)
Basophils Relative: 0 %
Eosinophils Absolute: 0 10*3/uL (ref 0.0–0.5)
Eosinophils Relative: 0 %
HCT: 28 % — ABNORMAL LOW (ref 39.0–52.0)
Hemoglobin: 9.4 g/dL — ABNORMAL LOW (ref 13.0–17.0)
Immature Granulocytes: 1 %
Lymphocytes Relative: 11 %
Lymphs Abs: 1.9 10*3/uL (ref 0.7–4.0)
MCH: 32 pg (ref 26.0–34.0)
MCHC: 33.6 g/dL (ref 30.0–36.0)
MCV: 95.2 fL (ref 80.0–100.0)
Monocytes Absolute: 1.6 10*3/uL — ABNORMAL HIGH (ref 0.1–1.0)
Monocytes Relative: 9 %
Neutro Abs: 13.4 10*3/uL — ABNORMAL HIGH (ref 1.7–7.7)
Neutrophils Relative %: 79 %
Platelets: 114 10*3/uL — ABNORMAL LOW (ref 150–400)
RBC: 2.94 MIL/uL — ABNORMAL LOW (ref 4.22–5.81)
RDW: 18.4 % — ABNORMAL HIGH (ref 11.5–15.5)
WBC: 17.1 10*3/uL — ABNORMAL HIGH (ref 4.0–10.5)
nRBC: 0 % (ref 0.0–0.2)

## 2021-06-02 LAB — COMPREHENSIVE METABOLIC PANEL
ALT: 13 U/L (ref 0–44)
AST: 24 U/L (ref 15–41)
Albumin: 2 g/dL — ABNORMAL LOW (ref 3.5–5.0)
Alkaline Phosphatase: 223 U/L — ABNORMAL HIGH (ref 38–126)
Anion gap: 8 (ref 5–15)
BUN: 14 mg/dL (ref 6–20)
CO2: 21 mmol/L — ABNORMAL LOW (ref 22–32)
Calcium: 7.4 mg/dL — ABNORMAL LOW (ref 8.9–10.3)
Chloride: 106 mmol/L (ref 98–111)
Creatinine, Ser: 0.6 mg/dL — ABNORMAL LOW (ref 0.61–1.24)
GFR, Estimated: >60 mL/min (ref >60)
Glucose, Bld: 155 mg/dL — ABNORMAL HIGH (ref 70–99)
Potassium: 2.8 mmol/L — ABNORMAL LOW (ref 3.5–5.1)
Sodium: 135 mmol/L (ref 135–145)
Total Bilirubin: 1.5 mg/dL — ABNORMAL HIGH (ref 0.3–1.2)
Total Protein: 4.2 g/dL — ABNORMAL LOW (ref 6.5–8.1)

## 2021-06-02 LAB — GLUCOSE, CAPILLARY
Glucose-Capillary: 149 mg/dL — ABNORMAL HIGH (ref 70–99)
Glucose-Capillary: 205 mg/dL — ABNORMAL HIGH (ref 70–99)
Glucose-Capillary: 112 mg/dL — ABNORMAL HIGH (ref 70–99)
Glucose-Capillary: 142 mg/dL — ABNORMAL HIGH (ref 70–99)

## 2021-06-02 LAB — MRSA NEXT GEN BY PCR, NASAL: MRSA by PCR Next Gen: NOT DETECTED

## 2021-06-02 LAB — CORTISOL: Cortisol, Plasma: 15.6 ug/dL

## 2021-06-02 LAB — HIV ANTIBODY (ROUTINE TESTING W REFLEX): HIV Screen 4th Generation wRfx: NONREACTIVE

## 2021-06-02 LAB — MAGNESIUM: Magnesium: 1.7 mg/dL (ref 1.7–2.4)

## 2021-06-02 MED ORDER — POTASSIUM CHLORIDE CRYS ER 20 MEQ PO TBCR
40.0000 meq | EXTENDED_RELEASE_TABLET | Freq: Once | ORAL | Status: AC
  Administered 2021-06-02: 40 meq via ORAL
  Filled 2021-06-02: qty 2

## 2021-06-02 MED ORDER — LACTATED RINGERS IV SOLN: INTRAVENOUS | Status: AC

## 2021-06-02 MED ORDER — POTASSIUM CHLORIDE 10 MEQ/100ML IV SOLN
10.0000 meq | INTRAVENOUS | Status: AC
  Administered 2021-06-02 (×4): 10 meq via INTRAVENOUS
  Filled 2021-06-02 (×4): qty 100

## 2021-06-02 NOTE — Progress Notes (Signed)
PROGRESS NOTE    Henry Johns  FKC:[id number] DOB: [birth year] DOA: 05/31/2021 PCP: [facility]   Brief Narrative:   Henry Johns  is a 60 y.o. male, with history of diabetes mellitus type 2, metastatic cancer getting chemo with next chemo being Monday, hypertension, and more presents ED with a chief complaint of abdominal pain.  He has been admitted with colitis noted on CT abdomen and pelvis and was started on Flagyl and Rocephin for coverage.  He has been started on clear liquids.  Unfortunately, he is becoming more hypotensive and currently requires norepinephrine for blood pressure support.  He continues to remain on chemotherapy every other week for his metastatic colon cancer.  Assessment & Plan:   Principal Problem:   Colitis Active Problems:   Diabetes mellitus with neuropathy (HCC)   Hypokalemia   Hyperlipidemia   Chronic pain   Sepsis secondary to sigmoid colitis -Continue on IV Zosyn for empiric coverage -Clear liquid diet and advance as tolerated today -Pain management as ordered -Follow CBC   Hypotension-ongoing -Plan to check cortisol level.  He is apparently noted to become hypotensive in the last 4-5 months and has stopped using atenolol at home -Possibly related to sepsis versus circulatory shock -Norepinephrine with close monitoring -Holding home as needed Lasix, holding atenolol   Hypokalemia -IV repletion ordered -Recheck in a.m.   Type 2 diabetes with neuropathy -SSI -Holding home insulin for now   Chronic pain secondary to cancer history -Continue home pain medications -IV morphine to be used judiciously with hypotension   Dyslipidemia -Continue statin   Chemo induced rash -Steroid cream   DVT prophylaxis: SCDs Code Status: Full Family Communication: None at bedside Disposition Plan:  Status is: Inpatient  Remains inpatient appropriate because: Continues to require IV medications and pressor  support.   Nutritional Assessment:  The patient's BMI is: Body mass index is 25.84 kg/m.Marland Kitchen  Seen by dietician.  I agree with the assessment and plan as outlined below:  Nutrition Status:       Skin Assessment:  I have examined the patient's skin and I agree with the wound assessment as performed by the wound care RN as outlined below:  Pressure Injury 06/01/21 Buttocks Bilateral Unstageable - Full thickness tissue loss in which the base of the injury is covered by slough (yellow, tan, gray, green or brown) and/or eschar (tan, brown or black) in the wound bed. (Active)  06/01/21 1857  Location: Buttocks  Location Orientation: Bilateral  Staging: Unstageable - Full thickness tissue loss in which the base of the injury is covered by slough (yellow, tan, gray, green or brown) and/or eschar (tan, brown or black) in the wound bed.  Wound Description (Comments):   Present on Admission: Yes    Consultants:  None  Procedures:  See below  Antimicrobials:  Anti-infectives (From admission, onward)    Start     Dose/Rate Route Frequency Ordered Stop   06/02/21 0200  cefTRIAXone (ROCEPHIN) 1 g in sodium chloride 0.9 % 100 mL IVPB  Status:  Discontinued        1 g 200 mL/hr over 30 Minutes Intravenous Every 24 hours 06/01/21 0816 06/01/21 0817   06/01/21 1600  piperacillin-tazobactam (ZOSYN) IVPB 3.375 g        3.375 g 12.5 mL/hr over 240 Minutes Intravenous Every 8 hours 06/01/21 0823     06/01/21 0830  metroNIDAZOLE (FLAGYL) IVPB 500 mg  Status:  Discontinued  500 mg 100 mL/hr over 60 Minutes Intravenous Every 12 hours 06/01/21 0816 06/01/21 0817   06/01/21 0830  piperacillin-tazobactam (ZOSYN) IVPB 3.375 g        3.375 g 100 mL/hr over 30 Minutes Intravenous  Once 06/01/21 0819 06/01/21 1058   06/01/21 0215  cefTRIAXone (ROCEPHIN) 2 g in sodium chloride 0.9 % 100 mL IVPB        2 g 200 mL/hr over 30 Minutes Intravenous  Once 06/01/21 0206 06/01/21 0413   06/01/21 0215   metroNIDAZOLE (FLAGYL) IVPB 500 mg        500 mg 100 mL/hr over 60 Minutes Intravenous  Once 06/01/21 0206 06/01/21 0448      Subjective: Patient seen and evaluated today with no new acute complaints or concerns. No acute concerns or events noted overnight.  He says overall he is feeling better and he would like to have his diet advanced.  He denies abdominal pain.  He continues to remain hypotensive.  Objective: Vitals:   06/02/21 0630 06/02/21 0645 06/02/21 0700 06/02/21 0841  BP: 114/72 120/73 115/72   Pulse: 100 (!) 101 (!) 106   Resp: 16 18 14    Temp:    99.9 F (37.7 C)  TempSrc:    Oral  SpO2: 98% 98% 98%   Weight:      Height:        Intake/Output Summary (Last 24 hours) at 06/02/2021 1019 Last data filed at 06/02/2021 0600 Gross per 24 hour  Intake 2600.22 ml  Output 500 ml  Net 2100.22 ml   Filed Weights   06/01/21 1602  Weight: 77.1 kg    Examination:  General exam: Appears calm and comfortable  Respiratory system: Clear to auscultation. Respiratory effort normal. Cardiovascular system: S1 & S2 heard, RRR.  Gastrointestinal system: Abdomen is soft Central nervous system: Alert and awake Extremities: No edema Skin: No significant lesions noted Psychiatry: Flat affect.    Data Reviewed: I have personally reviewed following labs and imaging studies  CBC: Recent Labs  Lab 05/31/21 2026 06/02/21 0425  WBC 15.2* 17.1*  NEUTROABS  --  13.4*  HGB 11.2* 9.4*  HCT 33.0* 28.0*  MCV 93.8 95.2  PLT 132* 008*   Basic Metabolic Panel: Recent Labs  Lab 05/31/21 2026 06/02/21 0425  NA 136 135  K 3.4* 2.8*  CL 105 106  CO2 22 21*  GLUCOSE 195* 155*  BUN 17 14  CREATININE 0.55* 0.60*  CALCIUM 7.8* 7.4*  MG 2.0 1.7   GFR: Estimated Creatinine Clearance: 96.2 mL/min (A) (by C-G formula based on SCr of 0.6 mg/dL (L)). Liver Function Tests: Recent Labs  Lab 05/31/21 2026 06/02/21 0425  AST 32 24  ALT 16 13  ALKPHOS 315* 223*  BILITOT 1.2 1.5*   PROT 5.2* 4.2*  ALBUMIN 2.7* 2.0*   Recent Labs  Lab 05/31/21 2026  LIPASE 30   No results for input(s): AMMONIA in the last 168 hours. Coagulation Profile: No results for input(s): INR, PROTIME in the last 168 hours. Cardiac Enzymes: No results for input(s): CKTOTAL, CKMB, CKMBINDEX, TROPONINI in the last 168 hours. BNP (last 3 results) No results for input(s): PROBNP in the last 8760 hours. HbA1C: Recent Labs    05/31/21 2026  HGBA1C 5.0   CBG: Recent Labs  Lab 06/01/21 1245 06/01/21 1729 06/01/21 2149 06/02/21 0758  GLUCAP 170* 121* 125* 149*   Lipid Profile: No results for input(s): CHOL, HDL, LDLCALC, TRIG, CHOLHDL, LDLDIRECT in the last 72 hours.  Thyroid Function Tests: No results for input(s): TSH, T4TOTAL, FREET4, T3FREE, THYROIDAB in the last 72 hours. Anemia Panel: No results for input(s): VITAMINB12, FOLATE, FERRITIN, TIBC, IRON, RETICCTPCT in the last 72 hours. Sepsis Labs: No results for input(s): PROCALCITON, LATICACIDVEN in the last 168 hours.  Recent Results (from the past 240 hour(s))  Resp Panel by RT-PCR (Flu A&B, Covid) Nasopharyngeal Swab     Status: None   Collection Time: 06/01/21  2:31 AM   Specimen: Nasopharyngeal Swab; Nasopharyngeal(NP) swabs in vial transport medium  Result Value Ref Range Status   SARS Coronavirus 2 by RT PCR NEGATIVE NEGATIVE Final    Comment: (NOTE) SARS-CoV-2 target nucleic acids are NOT DETECTED.  The SARS-CoV-2 RNA is generally detectable in upper respiratory specimens during the acute phase of infection. The lowest concentration of SARS-CoV-2 viral copies this assay can detect is 138 copies/mL. A negative result does not preclude SARS-Cov-2 infection and should not be used as the sole basis for treatment or other patient management decisions. A negative result may occur with  improper specimen collection/handling, submission of specimen other than nasopharyngeal swab, presence of viral mutation(s) within  the areas targeted by this assay, and inadequate number of viral copies(<138 copies/mL). A negative result must be combined with clinical observations, patient history, and epidemiological information. The expected result is Negative.  Fact Sheet for Patients:  EntrepreneurPulse.com.au  Fact Sheet for Healthcare Providers:  IncredibleEmployment.be  This test is no t yet approved or cleared by the [place] FDA and  has been authorized for detection and/or diagnosis of SARS-CoV-2 by FDA under an Emergency Use Authorization (EUA). This EUA will remain  in effect (meaning this test can be used) for the duration of the COVID-19 declaration under Section 564(b)(1) of the Act, 21 U.S.C.section 360bbb-3(b)(1), unless the authorization is terminated  or revoked sooner.       Influenza A by PCR NEGATIVE NEGATIVE Final   Influenza B by PCR NEGATIVE NEGATIVE Final    Comment: (NOTE) The Xpert Xpress SARS-CoV-2/FLU/RSV plus assay is intended as an aid in the diagnosis of influenza from Nasopharyngeal swab specimens and should not be used as a sole basis for treatment. Nasal washings and aspirates are unacceptable for Xpert Xpress SARS-CoV-2/FLU/RSV testing.  Fact Sheet for Patients: EntrepreneurPulse.com.au  Fact Sheet for Healthcare Providers: IncredibleEmployment.be  This test is not yet approved or cleared by the [place] FDA and has been authorized for detection and/or diagnosis of SARS-CoV-2 by FDA under an Emergency Use Authorization (EUA). This EUA will remain in effect (meaning this test can be used) for the duration of the COVID-19 declaration under Section 564(b)(1) of the Act, 21 U.S.C. section 360bbb-3(b)(1), unless the authorization is terminated or revoked.  Performed at [facility], [address] [place], [place] [address]   MRSA Next Gen by PCR, Nasal     Status: None   Collection  Time: 06/01/21  4:02 PM   Specimen: Nasal Mucosa; Nasal Swab  Result Value Ref Range Status   MRSA by PCR Next Gen NOT DETECTED NOT DETECTED Final    Comment: (NOTE) The GeneXpert MRSA Assay (FDA approved for NASAL specimens only), is one component of a comprehensive MRSA colonization surveillance program. It is not intended to diagnose MRSA infection nor to guide or monitor treatment for MRSA infections. Test performance is not FDA approved in patients less than 43 years old. Performed at [facility], [address], [place], [place] [address]  Radiology Studies: CT ABDOMEN PELVIS W CONTRAST  Result Date: 06/01/2021 CLINICAL DATA:  Abdominal pain.  Metastatic colon cancer. EXAM: CT ABDOMEN AND PELVIS WITH CONTRAST TECHNIQUE: Multidetector CT imaging of the abdomen and pelvis was performed using the standard protocol following bolus administration of intravenous contrast. CONTRAST:  161mL OMNIPAQUE IOHEXOL 300 MG/ML  SOLN COMPARISON:  CT of the chest abdomen pelvis dated 04/19/2021. FINDINGS: Lower chest: There is a 5 mm nodule in the right middle lobe. Right lung base atelectasis. The visualized left lung is clear. Partially visualized central venous line with tip at the cavoatrial junction. No intra-abdominal free air. Moderate ascites, increased since the prior CT. Hepatobiliary: Multiple hepatic metastatic disease, relatively similar to prior CT. There is irregularity of the liver contour which may represent cirrhosis or pseudo cirrhosis. No intrahepatic biliary dilatation. The gallbladder is unremarkable. Pancreas: Unremarkable. No pancreatic ductal dilatation or surrounding inflammatory changes. Spleen: Normal in size without focal abnormality. Adrenals/Urinary Tract: The adrenal glands unremarkable. There is a 1 cm right renal cyst. There is no hydronephrosis on either side. There is symmetric enhancement and excretion of contrast by both kidneys. The urinary bladder is grossly  unremarkable. Stomach/Bowel: There is moderate stool throughout the colon. There is segmental thickening of the sigmoid colon which may represent colitis or related to underlying infiltrative process. There is no bowel obstruction. The appendix is unremarkable. Vascular/Lymphatic: Mild aortoiliac atherosclerotic disease. The IVC is unremarkable. No portal venous gas. No definite adenopathy. Reproductive: The prostate and seminal vesicles are grossly unremarkable. Other: Diffuse subcutaneous edema and anasarca, new since the prior CT. Musculoskeletal: Degenerative changes of the spine. No acute osseous pathology. IMPRESSION: 1. Segmental thickening of the sigmoid colon may represent colitis or related to underlying infiltrative process. No bowel obstruction. Normal appendix. 2. Moderate ascites, increased since the prior CT. 3. Multiple hepatic metastatic disease, relatively similar to prior CT. 4. Diffuse subcutaneous edema and anasarca, new since the prior CT. 5. There is a 5 mm nodule in the right middle lobe. 6. Aortic Atherosclerosis (ICD10-I70.0). Electronically Signed   By: Anner Crete M.D.   On: 06/01/2021 01:24        Scheduled Meds:  Chlorhexidine Gluconate Cloth  6 each Topical Daily   insulin aspart  0-5 Units Subcutaneous QHS   insulin aspart  0-9 Units Subcutaneous TID WC   lactulose  20 g Oral q AM   Continuous Infusions:  lactated ringers 75 mL/hr at 06/02/21 0946   norepinephrine (LEVOPHED) Adult infusion 6 mcg/min (06/02/21 0319)   piperacillin-tazobactam (ZOSYN)  IV 3.375 g (06/02/21 0944)   potassium chloride 10 mEq (06/02/21 0850)     LOS: 1 day    Time spent: 35 minutes    Joran Kallal D Manuella Ghazi, DO [facility]  If 7PM-7AM, please contact night-coverage [email or web address] 06/02/2021, 10:19 AM

## 2021-06-02 NOTE — Progress Notes (Signed)
[facility] ADULT ICU REPLACEMENT PROTOCOL   The patient does apply for the [facility] Adult ICU Electrolyte Replacment Protocol based on the criteria listed below:   1.Exclusion criteria: TCTS patients, ECMO patients, and Dialysis patients 2. Is GFR >/= 30 ml/min? Yes.    Patient's GFR today is >60 3. Is SCr </= 2? Yes.   Patient's SCr is 0.6 mg/dL 4. Did SCr increase >/= 0.5 in 24 hours? No. 5.Pt's weight >40kg  Yes.   6. Abnormal electrolyte(s): K 2.8  7. Electrolytes replaced per protocol 8.  Call MD STAT for K+ </= 2.5, Phos </= 1, or Mag </= 1 Physician:    Ronda Fairly A 06/02/2021 6:27 AM

## 2021-06-03 ENCOUNTER — Encounter (HOSPITAL_COMMUNITY): Payer: Self-pay | Admitting: Family Medicine

## 2021-06-03 DIAGNOSIS — K529 Noninfective gastroenteritis and colitis, unspecified: Secondary | ICD-10-CM | POA: Diagnosis not present

## 2021-06-03 LAB — BASIC METABOLIC PANEL
Anion gap: 5 (ref 5–15)
BUN: 11 mg/dL (ref 6–20)
CO2: 22 mmol/L (ref 22–32)
Calcium: 7.4 mg/dL — ABNORMAL LOW (ref 8.9–10.3)
Chloride: 106 mmol/L (ref 98–111)
Creatinine, Ser: 0.56 mg/dL — ABNORMAL LOW (ref 0.61–1.24)
GFR, Estimated: >60 mL/min (ref >60)
Glucose, Bld: 133 mg/dL — ABNORMAL HIGH (ref 70–99)
Potassium: 3.1 mmol/L — ABNORMAL LOW (ref 3.5–5.1)
Sodium: 133 mmol/L — ABNORMAL LOW (ref 135–145)

## 2021-06-03 LAB — MAGNESIUM: Magnesium: 1.7 mg/dL (ref 1.7–2.4)

## 2021-06-03 LAB — CBC
HCT: 27 % — ABNORMAL LOW (ref 39.0–52.0)
Hemoglobin: 8.9 g/dL — ABNORMAL LOW (ref 13.0–17.0)
MCH: 31.4 pg (ref 26.0–34.0)
MCHC: 33 g/dL (ref 30.0–36.0)
MCV: 95.4 fL (ref 80.0–100.0)
Platelets: 116 10*3/uL — ABNORMAL LOW (ref 150–400)
RBC: 2.83 MIL/uL — ABNORMAL LOW (ref 4.22–5.81)
RDW: 17.2 % — ABNORMAL HIGH (ref 11.5–15.5)
WBC: 9.9 10*3/uL (ref 4.0–10.5)
nRBC: 0 % (ref 0.0–0.2)

## 2021-06-03 LAB — CORTISOL: Cortisol, Plasma: 10 ug/dL

## 2021-06-03 LAB — GLUCOSE, CAPILLARY
Glucose-Capillary: 129 mg/dL — ABNORMAL HIGH (ref 70–99)
Glucose-Capillary: 171 mg/dL — ABNORMAL HIGH (ref 70–99)
Glucose-Capillary: 138 mg/dL — ABNORMAL HIGH (ref 70–99)
Glucose-Capillary: 130 mg/dL — ABNORMAL HIGH (ref 70–99)

## 2021-06-03 MED ORDER — POTASSIUM CHLORIDE 10 MEQ/100ML IV SOLN
10.0000 meq | INTRAVENOUS | Status: AC
  Administered 2021-06-03 (×4): 10 meq via INTRAVENOUS
  Filled 2021-06-03 (×2): qty 50
  Filled 2021-06-03: qty 100
  Filled 2021-06-03: qty 50
  Filled 2021-06-03: qty 100
  Filled 2021-06-03: qty 50
  Filled 2021-06-03 (×2): qty 100

## 2021-06-03 MED ORDER — MIDODRINE HCL 5 MG PO TABS
5.0000 mg | ORAL_TABLET | Freq: Three times a day (TID) | ORAL | Status: DC
Start: 2021-06-03 — End: 2021-06-04
  Administered 2021-06-03 – 2021-06-04 (×5): 5 mg via ORAL
  Filled 2021-06-03 (×5): qty 1

## 2021-06-03 MED ORDER — POTASSIUM CHLORIDE CRYS ER 20 MEQ PO TBCR
20.0000 meq | EXTENDED_RELEASE_TABLET | ORAL | Status: AC
  Administered 2021-06-03 (×2): 20 meq via ORAL
  Filled 2021-06-03 (×2): qty 1

## 2021-06-03 NOTE — Progress Notes (Signed)
[facility] ADULT ICU REPLACEMENT PROTOCOL   The patient does apply for the [facility] Adult ICU Electrolyte Replacment Protocol based on the criteria listed below:   1.Exclusion criteria: TCTS patients, ECMO patients, and Dialysis patients 2. Is GFR >/= 30 ml/min? Yes.    Patient's GFR today is >60 3. Is SCr </= 2? Yes.   Patient's SCr is 0.56 mg/dL 4. Did SCr increase >/= 0.5 in 24 hours? No. 5.Pt's weight >40kg  Yes.   6. Abnormal electrolyte(s): K 3.1  7. Electrolytes replaced per protocol 8.  Call MD STAT for K+ </= 2.5, Phos </= 1, or Mag </= 1 Physician:    Billy Fischer 06/03/2021 6:32 AM

## 2021-06-03 NOTE — Progress Notes (Signed)
PROGRESS NOTE    BENNO BRENSINGER  WUJ:[id number] DOB: 02-20-1961 DOA: 05/31/2021 PCP: [name]   Brief Narrative:   Henry Johns  is a 60 y.o. male, with history of diabetes mellitus type 2, metastatic cancer getting chemo with next chemo being Monday, hypertension, and more presents ED with a chief complaint of abdominal pain.  He has been admitted with colitis noted on CT abdomen and pelvis and was started on Flagyl and Rocephin for coverage.  He has been started on clear liquids.  Unfortunately, he is becoming more hypotensive and currently requires norepinephrine for blood pressure support.  He continues to remain on chemotherapy every other week for his metastatic colon cancer.  Assessment & Plan:   Principal Problem:   Colitis Active Problems:   Diabetes mellitus with neuropathy (HCC)   Hypokalemia   Hyperlipidemia   Chronic pain   Sepsis secondary to sigmoid colitis -Continue on IV Zosyn for empiric coverage -Diet has been advanced -Pain management as ordered -Follow CBC   Hypotension-ongoing -Cortisol level within normal limits.  He is apparently noted to become hypotensive in the last 4-5 months and has stopped using atenolol at home -Possibly related to sepsis versus circulatory shock -Norepinephrine with close monitoring -Holding home as needed Lasix, holding atenolol -Start midodrine 5 mg 3 times daily on 7/26 to help wean off norepinephrine   Hypokalemia -IV and p.o. repletion ordered -Recheck in a.m.   Type 2 diabetes with neuropathy -SSI -Holding home insulin for now   Chronic pain secondary to cancer history -Continue home pain medications -IV morphine to be used judiciously with hypotension   Dyslipidemia -Continue statin   Chemo induced rash -Steroid cream     DVT prophylaxis: SCDs Code Status: Full Family Communication: None at bedside Disposition Plan:  Status is: Inpatient   Remains inpatient  appropriate because: Continues to require IV medications and pressor support.     Nutritional Assessment:   The patient's BMI is: Body mass index is 25.84 kg/m.Marland Kitchen   Seen by dietician.  I agree with the assessment and plan as outlined below:   Nutrition Status: Skin Assessment:   I have examined the patient's skin and I agree with the wound assessment as performed by the wound care RN as outlined below:   Pressure Injury 06/01/21 Buttocks Bilateral Unstageable - Full thickness tissue loss in which the base of the injury is covered by slough (yellow, tan, gray, green or brown) and/or eschar (tan, brown or black) in the wound bed. (Active)  06/01/21 1857  Location: Buttocks  Location Orientation: Bilateral  Staging: Unstageable - Full thickness tissue loss in which the base of the injury is covered by slough (yellow, tan, gray, green or brown) and/or eschar (tan, brown or black) in the wound bed.  Wound Description (Comments):   Present on Admission: Yes      Consultants:  None   Procedures:  See below  Antimicrobials:  Anti-infectives (From admission, onward)    Start     Dose/Rate Route Frequency Ordered Stop   06/02/21 0200  cefTRIAXone (ROCEPHIN) 1 g in sodium chloride 0.9 % 100 mL IVPB  Status:  Discontinued        1 g 200 mL/hr over 30 Minutes Intravenous Every 24 hours 06/01/21 0816 06/01/21 0817   06/01/21 1600  piperacillin-tazobactam (ZOSYN) IVPB 3.375 g        3.375 g 12.5 mL/hr over 240 Minutes Intravenous Every 8 hours 06/01/21 9562  06/01/21 0830  metroNIDAZOLE (FLAGYL) IVPB 500 mg  Status:  Discontinued        500 mg 100 mL/hr over 60 Minutes Intravenous Every 12 hours 06/01/21 0816 06/01/21 0817   06/01/21 0830  piperacillin-tazobactam (ZOSYN) IVPB 3.375 g        3.375 g 100 mL/hr over 30 Minutes Intravenous  Once 06/01/21 0819 06/01/21 1058   06/01/21 0215  cefTRIAXone (ROCEPHIN) 2 g in sodium chloride 0.9 % 100 mL IVPB        2 g 200 mL/hr over 30  Minutes Intravenous  Once 06/01/21 0206 06/01/21 0413   06/01/21 0215  metroNIDAZOLE (FLAGYL) IVPB 500 mg        500 mg 100 mL/hr over 60 Minutes Intravenous  Once 06/01/21 0206 06/01/21 0448       Subjective: Patient seen and evaluated today with no new acute complaints or concerns. No acute concerns or events noted overnight.  He states that his abdominal pain has resolved and he is having more formed bowel movements.  They are not as frequent.  He is tolerating diet.  He continues to have low blood pressure readings requiring pressor support.  Objective: Vitals:   06/03/21 0645 06/03/21 0700 06/03/21 0715 06/03/21 0730  BP: 96/60     Pulse: 98 (!) 110 96 93  Resp: 12 (!) 22 14 13   Temp:    98.3 F (36.8 C)  TempSrc:    Oral  SpO2: 98% 100% 99% 98%  Weight:      Height:        Intake/Output Summary (Last 24 hours) at 06/03/2021 0911 Last data filed at 06/03/2021 0839 Gross per 24 hour  Intake 2911.52 ml  Output 1100 ml  Net 1811.52 ml   Filed Weights   06/01/21 1602  Weight: 77.1 kg    Examination:  General exam: Appears calm and comfortable  Respiratory system: Clear to auscultation. Respiratory effort normal. Cardiovascular system: S1 & S2 heard, RRR.  Gastrointestinal system: Abdomen is soft Central nervous system: Alert and awake Extremities: No edema Skin: No significant lesions noted Psychiatry: Flat affect.    Data Reviewed: I have personally reviewed following labs and imaging studies  CBC: Recent Labs  Lab 05/31/21 2026 06/02/21 0425 06/03/21 0405  WBC 15.2* 17.1* 9.9  NEUTROABS  --  13.4*  --   HGB 11.2* 9.4* 8.9*  HCT 33.0* 28.0* 27.0*  MCV 93.8 95.2 95.4  PLT 132* 114* 932*   Basic Metabolic Panel: Recent Labs  Lab 05/31/21 2026 06/02/21 0425 06/03/21 0405  NA 136 135 133*  K 3.4* 2.8* 3.1*  CL 105 106 106  CO2 22 21* 22  GLUCOSE 195* 155* 133*  BUN 17 14 11   CREATININE 0.55* 0.60* 0.56*  CALCIUM 7.8* 7.4* 7.4*  MG 2.0 1.7  1.7   GFR: Estimated Creatinine Clearance: 96.2 mL/min (A) (by C-G formula based on SCr of 0.56 mg/dL (L)). Liver Function Tests: Recent Labs  Lab 05/31/21 2026 06/02/21 0425  AST 32 24  ALT 16 13  ALKPHOS 315* 223*  BILITOT 1.2 1.5*  PROT 5.2* 4.2*  ALBUMIN 2.7* 2.0*   Recent Labs  Lab 05/31/21 2026  LIPASE 30   No results for input(s): AMMONIA in the last 168 hours. Coagulation Profile: No results for input(s): INR, PROTIME in the last 168 hours. Cardiac Enzymes: No results for input(s): CKTOTAL, CKMB, CKMBINDEX, TROPONINI in the last 168 hours. BNP (last 3 results) No results for input(s): PROBNP in the last  8760 hours. HbA1C: Recent Labs    05/31/21 2026  HGBA1C 5.0   CBG: Recent Labs  Lab 06/02/21 0758 06/02/21 1121 06/02/21 1638 06/02/21 2113 06/03/21 0726  GLUCAP 149* 205* 112* 142* 129*   Lipid Profile: No results for input(s): CHOL, HDL, LDLCALC, TRIG, CHOLHDL, LDLDIRECT in the last 72 hours. Thyroid Function Tests: No results for input(s): TSH, T4TOTAL, FREET4, T3FREE, THYROIDAB in the last 72 hours. Anemia Panel: No results for input(s): VITAMINB12, FOLATE, FERRITIN, TIBC, IRON, RETICCTPCT in the last 72 hours. Sepsis Labs: No results for input(s): PROCALCITON, LATICACIDVEN in the last 168 hours.  Recent Results (from the past 240 hour(s))  Resp Panel by RT-PCR (Flu A&B, Covid) Nasopharyngeal Swab     Status: None   Collection Time: 06/01/21  2:31 AM   Specimen: Nasopharyngeal Swab; Nasopharyngeal(NP) swabs in vial transport medium  Result Value Ref Range Status   SARS Coronavirus 2 by RT PCR NEGATIVE NEGATIVE Final    Comment: (NOTE) SARS-CoV-2 target nucleic acids are NOT DETECTED.  The SARS-CoV-2 RNA is generally detectable in upper respiratory specimens during the acute phase of infection. The lowest concentration of SARS-CoV-2 viral copies this assay can detect is 138 copies/mL. A negative result does not preclude SARS-Cov-2 infection  and should not be used as the sole basis for treatment or other patient management decisions. A negative result may occur with  improper specimen collection/handling, submission of specimen other than nasopharyngeal swab, presence of viral mutation(s) within the areas targeted by this assay, and inadequate number of viral copies(<138 copies/mL). A negative result must be combined with clinical observations, patient history, and epidemiological information. The expected result is Negative.  Fact Sheet for Patients:  EntrepreneurPulse.com.au  Fact Sheet for Healthcare Providers:  IncredibleEmployment.be  This test is no t yet approved or cleared by the [place] FDA and  has been authorized for detection and/or diagnosis of SARS-CoV-2 by FDA under an Emergency Use Authorization (EUA). This EUA will remain  in effect (meaning this test can be used) for the duration of the COVID-19 declaration under Section 564(b)(1) of the Act, 21 U.S.C.section 360bbb-3(b)(1), unless the authorization is terminated  or revoked sooner.       Influenza A by PCR NEGATIVE NEGATIVE Final   Influenza B by PCR NEGATIVE NEGATIVE Final    Comment: (NOTE) The Xpert Xpress SARS-CoV-2/FLU/RSV plus assay is intended as an aid in the diagnosis of influenza from Nasopharyngeal swab specimens and should not be used as a sole basis for treatment. Nasal washings and aspirates are unacceptable for Xpert Xpress SARS-CoV-2/FLU/RSV testing.  Fact Sheet for Patients: EntrepreneurPulse.com.au  Fact Sheet for Healthcare Providers: IncredibleEmployment.be  This test is not yet approved or cleared by the [place] FDA and has been authorized for detection and/or diagnosis of SARS-CoV-2 by FDA under an Emergency Use Authorization (EUA). This EUA will remain in effect (meaning this test can be used) for the duration of the COVID-19 declaration  under Section 564(b)(1) of the Act, 21 U.S.C. section 360bbb-3(b)(1), unless the authorization is terminated or revoked.  Performed at [facility], [address], [place], [place] [address]   MRSA Next Gen by PCR, Nasal     Status: None   Collection Time: 06/01/21  4:02 PM   Specimen: Nasal Mucosa; Nasal Swab  Result Value Ref Range Status   MRSA by PCR Next Gen NOT DETECTED NOT DETECTED Final    Comment: (NOTE) The GeneXpert MRSA Assay (FDA approved for NASAL specimens only), is one component  of a comprehensive MRSA colonization surveillance program. It is not intended to diagnose MRSA infection nor to guide or monitor treatment for MRSA infections. Test performance is not FDA approved in patients less than 53 years old. Performed at [facility], [address], [place], [place] [address]          Radiology Studies: No results found.      Scheduled Meds:  Chlorhexidine Gluconate Cloth  6 each Topical Daily   insulin aspart  0-5 Units Subcutaneous QHS   insulin aspart  0-9 Units Subcutaneous TID WC   lactulose  20 g Oral q AM   midodrine  5 mg Oral TID WC   potassium chloride  20 mEq Oral Q4H   Continuous Infusions:  norepinephrine (LEVOPHED) Adult infusion 6 mcg/min (06/03/21 0105)   piperacillin-tazobactam (ZOSYN)  IV 3.375 g (06/03/21 0031)   potassium chloride       LOS: 2 days    Time spent: 35 minutes    Latarsha Zani D Manuella Ghazi, DO [facility]  If 7PM-7AM, please contact night-coverage [email or web address] 06/03/2021, 9:11 AM

## 2021-06-04 ENCOUNTER — Other Ambulatory Visit: Payer: Self-pay

## 2021-06-04 DIAGNOSIS — K529 Noninfective gastroenteritis and colitis, unspecified: Secondary | ICD-10-CM | POA: Diagnosis not present

## 2021-06-04 LAB — CBC
HCT: 25 % — ABNORMAL LOW (ref 39.0–52.0)
Hemoglobin: 8.4 g/dL — ABNORMAL LOW (ref 13.0–17.0)
MCH: 32.3 pg (ref 26.0–34.0)
MCHC: 33.6 g/dL (ref 30.0–36.0)
MCV: 96.2 fL (ref 80.0–100.0)
Platelets: 107 10*3/uL — ABNORMAL LOW (ref 150–400)
RBC: 2.6 MIL/uL — ABNORMAL LOW (ref 4.22–5.81)
RDW: 17.2 % — ABNORMAL HIGH (ref 11.5–15.5)
WBC: 6 10*3/uL (ref 4.0–10.5)
nRBC: 0 % (ref 0.0–0.2)

## 2021-06-04 LAB — FERRITIN: Ferritin: 268 ng/mL (ref 24–336)

## 2021-06-04 LAB — BASIC METABOLIC PANEL
Anion gap: 5 (ref 5–15)
BUN: 9 mg/dL (ref 6–20)
CO2: 21 mmol/L — ABNORMAL LOW (ref 22–32)
Calcium: 7.2 mg/dL — ABNORMAL LOW (ref 8.9–10.3)
Chloride: 105 mmol/L (ref 98–111)
Creatinine, Ser: 0.55 mg/dL — ABNORMAL LOW (ref 0.61–1.24)
GFR, Estimated: >60 mL/min (ref >60)
Glucose, Bld: 164 mg/dL — ABNORMAL HIGH (ref 70–99)
Potassium: 3.2 mmol/L — ABNORMAL LOW (ref 3.5–5.1)
Sodium: 131 mmol/L — ABNORMAL LOW (ref 135–145)

## 2021-06-04 LAB — RETICULOCYTES
Immature Retic Fract: 17.7 % — ABNORMAL HIGH (ref 2.3–15.9)
RBC.: 2.75 MIL/uL — ABNORMAL LOW (ref 4.22–5.81)
Retic Count, Absolute: 80.3 10*3/uL (ref 19.0–186.0)
Retic Ct Pct: 2.9 % (ref 0.4–3.1)

## 2021-06-04 LAB — IRON AND TIBC
Iron: 44 ug/dL — ABNORMAL LOW (ref 45–182)
Saturation Ratios: 28 % (ref 17.9–39.5)
TIBC: 159 ug/dL — ABNORMAL LOW (ref 250–450)
UIBC: 115 ug/dL

## 2021-06-04 LAB — GLUCOSE, CAPILLARY
Glucose-Capillary: 112 mg/dL — ABNORMAL HIGH (ref 70–99)
Glucose-Capillary: 133 mg/dL — ABNORMAL HIGH (ref 70–99)
Glucose-Capillary: 107 mg/dL — ABNORMAL HIGH (ref 70–99)
Glucose-Capillary: 137 mg/dL — ABNORMAL HIGH (ref 70–99)

## 2021-06-04 LAB — MAGNESIUM: Magnesium: 1.7 mg/dL (ref 1.7–2.4)

## 2021-06-04 LAB — FOLATE: Folate: 17.7 ng/mL (ref >5.9)

## 2021-06-04 LAB — VITAMIN B12: Vitamin B-12: 2224 pg/mL — ABNORMAL HIGH (ref 180–914)

## 2021-06-04 MED ORDER — MIDODRINE HCL 5 MG PO TABS
10.0000 mg | ORAL_TABLET | Freq: Three times a day (TID) | ORAL | Status: DC
  Administered 2021-06-04 – 2021-06-05 (×3): 10 mg via ORAL
  Filled 2021-06-04 (×3): qty 2

## 2021-06-04 MED ORDER — POTASSIUM CHLORIDE CRYS ER 20 MEQ PO TBCR
20.0000 meq | EXTENDED_RELEASE_TABLET | ORAL | Status: AC
  Administered 2021-06-04 (×2): 20 meq via ORAL
  Filled 2021-06-04 (×2): qty 1

## 2021-06-04 MED ORDER — SODIUM CHLORIDE 0.9 % IV BOLUS
1000.0000 mL | Freq: Once | INTRAVENOUS | Status: AC
  Administered 2021-06-04: 19:00:00 1000 mL via INTRAVENOUS

## 2021-06-04 MED ORDER — GERHARDT'S BUTT CREAM
TOPICAL_CREAM | Freq: Two times a day (BID) | CUTANEOUS | Status: DC
Start: 2021-06-05 — End: 2021-06-05
  Filled 2021-06-04: qty 1

## 2021-06-04 MED ORDER — POTASSIUM CHLORIDE 10 MEQ/100ML IV SOLN
10.0000 meq | INTRAVENOUS | Status: AC
  Administered 2021-06-04 (×4): 10 meq via INTRAVENOUS
  Filled 2021-06-04 (×4): qty 100

## 2021-06-04 NOTE — Progress Notes (Signed)
PROGRESS NOTE    Henry Johns  EXB:[id number] DOB: 08-Mar-1961 DOA: 05/31/2021 PCP: [facility]   Brief Narrative:   Henry Johns  is a 60 y.o. male, with history of diabetes mellitus type 2, metastatic cancer getting chemo with next chemo being Monday, hypertension, and more presents ED with a chief complaint of abdominal pain.  He has been admitted with colitis noted on CT abdomen and pelvis and was started on Flagyl and Rocephin for coverage.  He has been started on clear liquids.  Unfortunately, he is becoming more hypotensive and currently requires norepinephrine for blood pressure support.  He continues to remain on chemotherapy every other week for his metastatic colon cancer.  Assessment & Plan:   Principal Problem:   Colitis Active Problems:   Diabetes mellitus with neuropathy (HCC)   Hypokalemia   Hyperlipidemia   Chronic pain   Sepsis secondary to sigmoid colitis -Continue on IV Zosyn for empiric coverage -Diet has been advanced -Pain management as ordered -Follow CBC   Hypotension-ongoing -Cortisol level within normal limits.  He is apparently noted to become hypotensive in the last 4-5 months and has stopped using atenolol at home -Possibly related to sepsis versus circulatory shock -Norepinephrine with close monitoring -Holding home as needed Lasix, holding atenolol -Start midodrine 5 mg 3 times daily on 7/26 to help wean off norepinephrine -Slow improvement in blood pressure readings with less norepinephrine required   Hypokalemia -IV and p.o. repletion ordered -Recheck in a.m.  Anemia -Check anemia panel -Monitor CBC   Type 2 diabetes with neuropathy -SSI -Holding home insulin for now   Chronic pain secondary to cancer history -Continue home pain medications -IV morphine to be used judiciously with hypotension   Dyslipidemia -Continue statin   Chemo induced rash -Steroid cream     DVT prophylaxis: SCDs Code  Status: Full Family Communication: Brother and sister at bedside 11/27 Disposition Plan:  Status is: Inpatient   Remains inpatient appropriate because: Continues to require IV medications and pressor support.     Nutritional Assessment:   The patient's BMI is: Body mass index is 25.84 kg/m.Marland Kitchen   Seen by dietician.  I agree with the assessment and plan as outlined below:   Nutrition Status: Skin Assessment:   I have examined the patient's skin and I agree with the wound assessment as performed by the wound care RN as outlined below:   Pressure Injury 06/01/21 Buttocks Bilateral Unstageable - Full thickness tissue loss in which the base of the injury is covered by slough (yellow, tan, gray, green or brown) and/or eschar (tan, brown or black) in the wound bed. (Active)  06/01/21 1857  Location: Buttocks  Location Orientation: Bilateral  Staging: Unstageable - Full thickness tissue loss in which the base of the injury is covered by slough (yellow, tan, gray, green or brown) and/or eschar (tan, brown or black) in the wound bed.  Wound Description (Comments):   Present on Admission: Yes      Consultants:  None   Procedures:  See below  Antimicrobials:  Anti-infectives (From admission, onward)    Start     Dose/Rate Route Frequency Ordered Stop   06/02/21 0200  cefTRIAXone (ROCEPHIN) 1 g in sodium chloride 0.9 % 100 mL IVPB  Status:  Discontinued        1 g 200 mL/hr over 30 Minutes Intravenous Every 24 hours 06/01/21 0816 06/01/21 0817   06/01/21 1600  piperacillin-tazobactam (ZOSYN) IVPB 3.375 g  3.375 g 12.5 mL/hr over 240 Minutes Intravenous Every 8 hours 06/01/21 0823     06/01/21 0830  metroNIDAZOLE (FLAGYL) IVPB 500 mg  Status:  Discontinued        500 mg 100 mL/hr over 60 Minutes Intravenous Every 12 hours 06/01/21 0816 06/01/21 0817   06/01/21 0830  piperacillin-tazobactam (ZOSYN) IVPB 3.375 g        3.375 g 100 mL/hr over 30 Minutes Intravenous  Once 06/01/21  0819 06/01/21 1058   06/01/21 0215  cefTRIAXone (ROCEPHIN) 2 g in sodium chloride 0.9 % 100 mL IVPB        2 g 200 mL/hr over 30 Minutes Intravenous  Once 06/01/21 0206 06/01/21 0413   06/01/21 0215  metroNIDAZOLE (FLAGYL) IVPB 500 mg        500 mg 100 mL/hr over 60 Minutes Intravenous  Once 06/01/21 0206 06/01/21 0448      Subjective: Patient seen and evaluated today with some ongoing diarrhea noted overnight.  He denies any further abdominal pain and is tolerating diet.  He continues to have ongoing hypotension however requiring norepinephrine.  Objective: Vitals:   06/04/21 0700 06/04/21 0731 06/04/21 0800 06/04/21 0815  BP: 110/75 94/69 (!) 90/51 (!) 95/48  Pulse: 88 89 83 84  Resp: 11 14 12 11   Temp:  98 F (36.7 C)    TempSrc:  Oral    SpO2: 97% 98% 98% 98%  Weight:      Height:        Intake/Output Summary (Last 24 hours) at 06/04/2021 0950 Last data filed at 06/04/2021 0900 Gross per 24 hour  Intake 1686.3 ml  Output --  Net 1686.3 ml   Filed Weights   06/01/21 1602  Weight: 77.1 kg    Examination:  General exam: Appears calm and comfortable  Respiratory system: Clear to auscultation. Respiratory effort normal. Cardiovascular system: S1 & S2 heard, RRR.  Gastrointestinal system: Abdomen is soft Central nervous system: Alert and awake Extremities: No edema Skin: No significant lesions noted Psychiatry: Flat affect.    Data Reviewed: I have personally reviewed following labs and imaging studies  CBC: Recent Labs  Lab 05/31/21 2026 06/02/21 0425 06/03/21 0405 06/04/21 0259  WBC 15.2* 17.1* 9.9 6.0  NEUTROABS  --  13.4*  --   --   HGB 11.2* 9.4* 8.9* 8.4*  HCT 33.0* 28.0* 27.0* 25.0*  MCV 93.8 95.2 95.4 96.2  PLT 132* 114* 116* 465*   Basic Metabolic Panel: Recent Labs  Lab 05/31/21 2026 06/02/21 0425 06/03/21 0405 06/04/21 0259  NA 136 135 133* 131*  K 3.4* 2.8* 3.1* 3.2*  CL 105 106 106 105  CO2 22 21* 22 21*  GLUCOSE 195* 155* 133*  164*  BUN 17 14 11 9   CREATININE 0.55* 0.60* 0.56* 0.55*  CALCIUM 7.8* 7.4* 7.4* 7.2*  MG 2.0 1.7 1.7 1.7   GFR: Estimated Creatinine Clearance: 96.2 mL/min (A) (by C-G formula based on SCr of 0.55 mg/dL (L)). Liver Function Tests: Recent Labs  Lab 05/31/21 2026 06/02/21 0425  AST 32 24  ALT 16 13  ALKPHOS 315* 223*  BILITOT 1.2 1.5*  PROT 5.2* 4.2*  ALBUMIN 2.7* 2.0*   Recent Labs  Lab 05/31/21 2026  LIPASE 30   No results for input(s): AMMONIA in the last 168 hours. Coagulation Profile: No results for input(s): INR, PROTIME in the last 168 hours. Cardiac Enzymes: No results for input(s): CKTOTAL, CKMB, CKMBINDEX, TROPONINI in the last 168 hours. BNP (last 3  results) No results for input(s): PROBNP in the last 8760 hours. HbA1C: No results for input(s): HGBA1C in the last 72 hours. CBG: Recent Labs  Lab 06/03/21 0726 06/03/21 1113 06/03/21 1631 06/03/21 2056 06/04/21 0730  GLUCAP 129* 171* 138* 130* 112*   Lipid Profile: No results for input(s): CHOL, HDL, LDLCALC, TRIG, CHOLHDL, LDLDIRECT in the last 72 hours. Thyroid Function Tests: No results for input(s): TSH, T4TOTAL, FREET4, T3FREE, THYROIDAB in the last 72 hours. Anemia Panel: No results for input(s): VITAMINB12, FOLATE, FERRITIN, TIBC, IRON, RETICCTPCT in the last 72 hours. Sepsis Labs: No results for input(s): PROCALCITON, LATICACIDVEN in the last 168 hours.  Recent Results (from the past 240 hour(s))  Resp Panel by RT-PCR (Flu A&B, Covid) Nasopharyngeal Swab     Status: None   Collection Time: 06/01/21  2:31 AM   Specimen: Nasopharyngeal Swab; Nasopharyngeal(NP) swabs in vial transport medium  Result Value Ref Range Status   SARS Coronavirus 2 by RT PCR NEGATIVE NEGATIVE Final    Comment: (NOTE) SARS-CoV-2 target nucleic acids are NOT DETECTED.  The SARS-CoV-2 RNA is generally detectable in upper respiratory specimens during the acute phase of infection. The lowest concentration of SARS-CoV-2  viral copies this assay can detect is 138 copies/mL. A negative result does not preclude SARS-Cov-2 infection and should not be used as the sole basis for treatment or other patient management decisions. A negative result may occur with  improper specimen collection/handling, submission of specimen other than nasopharyngeal swab, presence of viral mutation(s) within the areas targeted by this assay, and inadequate number of viral copies(<138 copies/mL). A negative result must be combined with clinical observations, patient history, and epidemiological information. The expected result is Negative.  Fact Sheet for Patients:  EntrepreneurPulse.com.au  Fact Sheet for Healthcare Providers:  IncredibleEmployment.be  This test is no t yet approved or cleared by the [place] FDA and  has been authorized for detection and/or diagnosis of SARS-CoV-2 by FDA under an Emergency Use Authorization (EUA). This EUA will remain  in effect (meaning this test can be used) for the duration of the COVID-19 declaration under Section 564(b)(1) of the Act, 21 U.S.C.section 360bbb-3(b)(1), unless the authorization is terminated  or revoked sooner.       Influenza A by PCR NEGATIVE NEGATIVE Final   Influenza B by PCR NEGATIVE NEGATIVE Final    Comment: (NOTE) The Xpert Xpress SARS-CoV-2/FLU/RSV plus assay is intended as an aid in the diagnosis of influenza from Nasopharyngeal swab specimens and should not be used as a sole basis for treatment. Nasal washings and aspirates are unacceptable for Xpert Xpress SARS-CoV-2/FLU/RSV testing.  Fact Sheet for Patients: EntrepreneurPulse.com.au  Fact Sheet for Healthcare Providers: IncredibleEmployment.be  This test is not yet approved or cleared by the [place] FDA and has been authorized for detection and/or diagnosis of SARS-CoV-2 by FDA under an Emergency Use Authorization  (EUA). This EUA will remain in effect (meaning this test can be used) for the duration of the COVID-19 declaration under Section 564(b)(1) of the Act, 21 U.S.C. section 360bbb-3(b)(1), unless the authorization is terminated or revoked.  Performed at [facility], [address], [place], [place] [address]   MRSA Next Gen by PCR, Nasal     Status: None   Collection Time: 06/01/21  4:02 PM   Specimen: Nasal Mucosa; Nasal Swab  Result Value Ref Range Status   MRSA by PCR Next Gen NOT DETECTED NOT DETECTED Final    Comment: (NOTE) The GeneXpert MRSA Assay (FDA approved  for NASAL specimens only), is one component of a comprehensive MRSA colonization surveillance program. It is not intended to diagnose MRSA infection nor to guide or monitor treatment for MRSA infections. Test performance is not FDA approved in patients less than 31 years old. Performed at [facility], [address], [place], [place] [address]          Radiology Studies: No results found.      Scheduled Meds:  Chlorhexidine Gluconate Cloth  6 each Topical Daily   insulin aspart  0-5 Units Subcutaneous QHS   insulin aspart  0-9 Units Subcutaneous TID WC   midodrine  5 mg Oral TID WC   potassium chloride  20 mEq Oral Q4H   Continuous Infusions:  norepinephrine (LEVOPHED) Adult infusion 2 mcg/min (06/03/21 1728)   piperacillin-tazobactam (ZOSYN)  IV 3.375 g (06/04/21 0936)   potassium chloride 10 mEq (06/04/21 0931)     LOS: 3 days    Time spent: 35 minutes    Mclean Moya D Manuella Ghazi, DO [facility]  If 7PM-7AM, please contact night-coverage [email or web address] 06/04/2021, 9:50 AM

## 2021-06-04 NOTE — Progress Notes (Shared)
Henry Johns, [place] [id number]   CLINIC:  Medical Oncology/Hematology  PCP:  [facility] [address] / [place] [address] [phone]   REASON FOR VISIT:  Follow-up for ***  PRIOR THERAPY: ***  NGS Results: ***  CURRENT THERAPY: ***  BRIEF ONCOLOGIC HISTORY:  Oncology History Overview Note  MMR normal Foundation One: low mutation burden   Metastasis to liver ([place])  01/04/2021 Initial Diagnosis   Metastasis to liver ([place])   01/25/2021 -  Chemotherapy   Patient is on Treatment Plan : COLORECTAL FOLFOX q14d     Colon cancer metastasized to liver ([place])  12/26/2020 Imaging   Ct abdomen and pelvis 1. Extensive hepatic metastatic disease and retroperitoneal adenopathy. 2. A 7 mm right lung base nodule, most consistent with metastatic disease. 3. No bowel obstruction. Normal appendix. 4. Aortic Atherosclerosis (ICD10-I70.0).   12/26/2020 Imaging   CT chest 1. Several small lung nodules consistent with metastatic disease. The primary malignancy remains unclear. The liver morphology suggests underlying cirrhosis, and multifocal hepatocellular carcinoma should be included in the differential diagnosis. 2. No acute abnormality in the chest.     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 LIVER, NEEDLE CORE BIOPSY:  -  Metastatic adenocarcinoma  -  See comment   COMMENT:   The neoplastic cells are positive for cytokeratin 20 and CDX2 but negative for cytokeratin 7, TTF-1, PSA and prostein.  The combined morphology and immunophenotype are consistent with metastasis from a  gastrointestinal primary.  Dr. Saralyn Pilar reviewed the case and agrees with the above diagnosis.     01/13/2021 Procedure   Colonoscopy - Two 3 to 5 mm polyps in the transverse colon, removed with a cold snare. Resected and retrieved. - Malignant partially obstructing tumor at 17 cm  proximal to the anus. Biopsied. Tattooed. - The distal rectum and anal verge are normal on retroflexion view.   01/13/2021 Cancer Staging   Staging form: Colon and Rectum, AJCC 8th Edition - Clinical stage from 01/13/2021: Stage IVB (cTX, cN0, pM1b) - Signed by Heath Lark, MD on 01/13/2021 Stage prefix: Initial diagnosis Total positive nodes: 0    01/13/2021 Procedure   Colonoscopy - Two 3 to 5 mm polyps in the transverse colon, removed with a cold snare. Resected and retrieved. - Malignant partially obstructing tumor at 17 cm proximal to the anus. Biopsied. Tattooed. - The distal rectum and anal verge are normal on retroflexion view.   01/13/2021 Procedure   EGD - Normal esophagus. - Z-line regular, 40 cm from the incisors. - Normal stomach. - Nodular mucosa in   01/13/2021 Pathology Results   A. DUODENUM, NODULE, BIOPSY:  - Peptic duodenitis.  - No dysplasia or malignancy.   B. COLON, SIGMOID, MASS, BIOPSY:  - Invasive adenocarcinoma.   C. COLON, TRANSVERSE, POLYPECTOMY:  - Fragment of ulcerated tissue with adenocarcinoma, see comment.  - Tubular adenoma (X2 fragments).   COMMENT:   B. MMR will be ordered   01/13/2021 Tumor Marker   Patient's tumor was tested for the following markers: CEA. Results of the tumor marker test revealed 883.   01/24/2021 Procedure   Successful right-sided port placement, with the tip of the catheter in the proximal right atrium.   Plan: Catheter ready for use.   See detailed procedure note with images in PACS. The patient tolerated the procedure well without incident or complication and  was returned to Recovery in stable conditi   01/25/2021 -  Chemotherapy   Patient is on Treatment Plan : COLORECTAL FOLFOX q14d     01/25/2021 Tumor Marker   Patient's tumor was tested for the following markers: CEA. Results of the tumor marker test revealed 1378.     CANCER STAGING:  Cancer Staging  Colon cancer metastasized to liver [facility]) Staging form:  Colon and Rectum, AJCC 8th Edition - Clinical stage from 01/13/2021: Stage IVB (cTX, cN0, pM1b) - Signed by Artis Delay, MD on 01/13/2021   INTERVAL HISTORY:  [name], a 60 y.o. male, returns for routine follow-up and consideration for next cycle of chemotherapy. Henry Johns was last seen on {XX/XX/XXXX}.  Due for cycle #*** of *** today.   Overall, he tells me he has been feeling pretty well. ***  Overall, he feels ready for next cycle of chemo today.    REVIEW OF SYSTEMS:  Review of Systems - Oncology  PAST MEDICAL/SURGICAL HISTORY:  Past Medical History:  Diagnosis Date   Diabetes mellitus without complication (HCC)    Hypertension    Past Surgical History:  Procedure Laterality Date   BIOPSY  01/13/2021   Procedure: BIOPSY;  Surgeon: Dolores Frame, MD;  [facility];  Service: Gastroenterology;;   CATARACT EXTRACTION Right    COLONOSCOPY WITH PROPOFOL N/A 01/13/2021   Procedure: COLONOSCOPY WITH PROPOFOL;  Surgeon: Dolores Frame, MD;  Location: [facility];  Service: Gastroenterology;  Laterality: N/A;  11:05   ESOPHAGOGASTRODUODENOSCOPY (EGD) WITH PROPOFOL N/A 01/13/2021   Procedure: ESOPHAGOGASTRODUODENOSCOPY (EGD) WITH PROPOFOL;  Surgeon: Dolores Frame, MD;  Location[facility];  Service: Gastroenterology;  Laterality: N/A;   IR IMAGING GUIDED PORT INSERTION  01/24/2021   LIVER BIOPSY  01/10/2021   U/S guided   POLYPECTOMY  01/13/2021   Procedure: POLYPECTOMY;  Surgeon: Dolores Frame, MD;  [facility];  Service: Gastroenterology;;    SOCIAL HISTORY:  Social History   Socioeconomic History   Marital status: Married    Spouse name: Not on file   Number of children: Not on file   Years of education: Not on file   Highest education level: Not on file  Occupational History   Not on file  Tobacco Use   Smoking status: Never   Smokeless tobacco: Never  Vaping Use   Vaping Use: Never used   Substance and Sexual Activity   Alcohol use: Not Currently   Drug use: Not Currently   Sexual activity: Not Currently  Other Topics Concern   Not on file  Social History Narrative   Married, no childer   Social Determinants of Health   Financial Resource Strain: Not on file  Food Insecurity: Not on file  Transportation Needs: No Transportation Needs   Lack of Transportation (Medical): No   Lack of Transportation (Non-Medical): No  Physical Activity: Inactive   Days of Exercise per Week: 0 days   Minutes of Exercise per Session: 0 min  Stress: Not on file  Social Connections: Not on file  Intimate Partner Violence: Not At Risk   Fear of Current or Ex-Partner: No   Emotionally Abused: No   Physically Abused: No   Sexually Abused: No    FAMILY HISTORY:  Family History  Problem Relation Age of Onset   Cancer Father 58       prostate ca    CURRENT MEDICATIONS:  No current facility-administered medications for this visit.  No current outpatient medications on file.   Facility-Administered Medications Ordered in Other Visits  Medication Dose Route Frequency Provider Last Rate Last Admin   acetaminophen (TYLENOL) tablet 650 mg  650 mg Oral Q6H PRN Zierle-Ghosh, Asia B, DO       Or   acetaminophen (TYLENOL) suppository 650 mg  650 mg Rectal Q6H PRN Zierle-Ghosh, Asia B, DO       Chlorhexidine Gluconate Cloth 2 % PADS 6 each  6 each Topical Daily Zierle-Ghosh, Asia B, DO   6 each at 06/04/21 0937   insulin aspart (novoLOG) injection 0-5 Units  0-5 Units Subcutaneous QHS Zierle-Ghosh, Asia B, DO       insulin aspart (novoLOG) injection 0-9 Units  0-9 Units Subcutaneous TID WC Zierle-Ghosh, Asia B, DO   1 Units at 06/03/21 1715   LORazepam (ATIVAN) tablet 0.5 mg  0.5 mg Oral BID PRN Zierle-Ghosh, Asia B, DO   0.5 mg at 06/04/21 0004   midodrine (PROAMATINE) tablet 5 mg  5 mg Oral TID WC Manuella Ghazi, Pratik D, DO   5 mg at 06/04/21 0817   morphine (MSIR) tablet 15 mg  15 mg Oral BID  PRN Zierle-Ghosh, Asia B, DO       morphine 2 MG/ML injection 2 mg  2 mg Intravenous Q2H PRN Zierle-Ghosh, Asia B, DO   2 mg at 06/04/21 0214   norepinephrine (LEVOPHED) 4mg  in 275mL premix infusion  0-40 mcg/min Intravenous Titrated Manuella Ghazi, Pratik D, DO 7.5 mL/hr at 06/03/21 1728 2 mcg/min at 06/03/21 1728   ondansetron (ZOFRAN) tablet 4 mg  4 mg Oral Q6H PRN Zierle-Ghosh, Asia B, DO       Or   ondansetron (ZOFRAN) injection 4 mg  4 mg Intravenous Q6H PRN Zierle-Ghosh, Asia B, DO       oxyCODONE (Oxy IR/ROXICODONE) immediate release tablet 5 mg  5 mg Oral Q4H PRN Zierle-Ghosh, Asia B, DO   5 mg at 06/04/21 0004   piperacillin-tazobactam (ZOSYN) IVPB 3.375 g  3.375 g Intravenous Q8H Shah, Pratik D, DO 12.5 mL/hr at 06/04/21 0936 3.375 g at 06/04/21 0936   potassium chloride 10 mEq in 100 mL IVPB  10 mEq Intravenous Q1 Hr x 4 Heath Lark D, DO 100 mL/hr at 06/04/21 0931 10 mEq at 06/04/21 0931   potassium chloride SA (KLOR-CON) CR tablet 20 mEq  20 mEq Oral Q4H Shah, Pratik D, DO   20 mEq at 06/04/21 0817   temazepam (RESTORIL) capsule 15 mg  15 mg Oral QHS PRN Zierle-Ghosh, Asia B, DO        ALLERGIES:  No Known Allergies  PHYSICAL EXAM:  Performance status (ECOG): {CHL ONC TM:[id number]}  There were no vitals filed for this visit. Wt Readings from Last 3 Encounters:  06/01/21 169 lb 15.6 oz (77.1 kg)  05/22/21 162 lb (73.5 kg)  05/08/21 164 lb 3.2 oz (74.5 kg)   Physical Exam  LABORATORY DATA:  I have reviewed the labs as listed.  CBC Latest Ref Rng & Units 06/04/2021 06/03/2021 06/02/2021  WBC 4.0 - 10.5 K/uL 6.0 9.9 17.1(H)  Hemoglobin 13.0 - 17.0 g/dL 8.4(L) 8.9(L) 9.4(L)  Hematocrit 39.0 - 52.0 % 25.0(L) 27.0(L) 28.0(L)  Platelets 150 - 400 K/uL 107(L) 116(L) 114(L)   CMP Latest Ref Rng & Units 06/04/2021 06/03/2021 06/02/2021  Glucose 70 - 99 mg/dL 164(H) 133(H) 155(H)  BUN 6 - 20 mg/dL 9 11 14   Creatinine 0.61 - 1.24 mg/dL 0.55(L) 0.56(L) 0.60(L)  Sodium 135 - 145  mmol/L  131(L) 133(L) 135  Potassium 3.5 - 5.1 mmol/L 3.2(L) 3.1(L) 2.8(L)  Chloride 98 - 111 mmol/L 105 106 106  CO2 22 - 32 mmol/L 21(L) 22 21(L)  Calcium 8.9 - 10.3 mg/dL 7.2(L) 7.4(L) 7.4(L)  Total Protein 6.5 - 8.1 g/dL - - 4.2(L)  Total Bilirubin 0.3 - 1.2 mg/dL - - 1.5(H)  Alkaline Phos 38 - 126 U/L - - 223(H)  AST 15 - 41 U/L - - 24  ALT 0 - 44 U/L - - 13    DIAGNOSTIC IMAGING:  I have independently reviewed the scans and discussed with the patient. CT ABDOMEN PELVIS W CONTRAST  Result Date: 06/01/2021 CLINICAL DATA:  Abdominal pain.  Metastatic colon cancer. EXAM: CT ABDOMEN AND PELVIS WITH CONTRAST TECHNIQUE: Multidetector CT imaging of the abdomen and pelvis was performed using the standard protocol following bolus administration of intravenous contrast. CONTRAST:  11mL OMNIPAQUE IOHEXOL 300 MG/ML  SOLN COMPARISON:  CT of the chest abdomen pelvis dated 04/19/2021. FINDINGS: Lower chest: There is a 5 mm nodule in the right middle lobe. Right lung base atelectasis. The visualized left lung is clear. Partially visualized central venous line with tip at the cavoatrial junction. No intra-abdominal free air. Moderate ascites, increased since the prior CT. Hepatobiliary: Multiple hepatic metastatic disease, relatively similar to prior CT. There is irregularity of the liver contour which may represent cirrhosis or pseudo cirrhosis. No intrahepatic biliary dilatation. The gallbladder is unremarkable. Pancreas: Unremarkable. No pancreatic ductal dilatation or surrounding inflammatory changes. Spleen: Normal in size without focal abnormality. Adrenals/Urinary Tract: The adrenal glands unremarkable. There is a 1 cm right renal cyst. There is no hydronephrosis on either side. There is symmetric enhancement and excretion of contrast by both kidneys. The urinary bladder is grossly unremarkable. Stomach/Bowel: There is moderate stool throughout the colon. There is segmental thickening of the sigmoid colon which  may represent colitis or related to underlying infiltrative process. There is no bowel obstruction. The appendix is unremarkable. Vascular/Lymphatic: Mild aortoiliac atherosclerotic disease. The IVC is unremarkable. No portal venous gas. No definite adenopathy. Reproductive: The prostate and seminal vesicles are grossly unremarkable. Other: Diffuse subcutaneous edema and anasarca, new since the prior CT. Musculoskeletal: Degenerative changes of the spine. No acute osseous pathology. IMPRESSION: 1. Segmental thickening of the sigmoid colon may represent colitis or related to underlying infiltrative process. No bowel obstruction. Normal appendix. 2. Moderate ascites, increased since the prior CT. 3. Multiple hepatic metastatic disease, relatively similar to prior CT. 4. Diffuse subcutaneous edema and anasarca, new since the prior CT. 5. There is a 5 mm nodule in the right middle lobe. 6. Aortic Atherosclerosis (ICD10-I70.0). Electronically Signed   By: Anner Crete M.D.   On: 06/01/2021 01:24     ASSESSMENT:  ***   PLAN:  ***   Orders placed this encounter:  No orders of the defined types were placed in this encounter.    Derek Jack, MD [facility] [phone]   I, ***, am acting as a scribe for Dr. Derek Jack.  {Add Barista Statement}

## 2021-06-05 ENCOUNTER — Inpatient Hospital Stay (HOSPITAL_COMMUNITY): Payer: Medicaid Other

## 2021-06-05 ENCOUNTER — Inpatient Hospital Stay (HOSPITAL_COMMUNITY): Payer: Medicaid Other | Admitting: Hematology

## 2021-06-05 ENCOUNTER — Ambulatory Visit: Payer: Self-pay | Admitting: Gastroenterology

## 2021-06-05 DIAGNOSIS — K529 Noninfective gastroenteritis and colitis, unspecified: Secondary | ICD-10-CM | POA: Diagnosis not present

## 2021-06-05 LAB — CBC
HCT: 26.6 % — ABNORMAL LOW (ref 39.0–52.0)
Hemoglobin: 8.6 g/dL — ABNORMAL LOW (ref 13.0–17.0)
MCH: 31.3 pg (ref 26.0–34.0)
MCHC: 32.3 g/dL (ref 30.0–36.0)
MCV: 96.7 fL (ref 80.0–100.0)
Platelets: 109 10*3/uL — ABNORMAL LOW (ref 150–400)
RBC: 2.75 MIL/uL — ABNORMAL LOW (ref 4.22–5.81)
RDW: 16.9 % — ABNORMAL HIGH (ref 11.5–15.5)
WBC: 4.9 10*3/uL (ref 4.0–10.5)
nRBC: 0 % (ref 0.0–0.2)

## 2021-06-05 LAB — BASIC METABOLIC PANEL
Anion gap: 4 — ABNORMAL LOW (ref 5–15)
BUN: 8 mg/dL (ref 6–20)
CO2: 21 mmol/L — ABNORMAL LOW (ref 22–32)
Calcium: 7.3 mg/dL — ABNORMAL LOW (ref 8.9–10.3)
Chloride: 108 mmol/L (ref 98–111)
Creatinine, Ser: 0.54 mg/dL — ABNORMAL LOW (ref 0.61–1.24)
GFR, Estimated: >60 mL/min (ref >60)
Glucose, Bld: 108 mg/dL — ABNORMAL HIGH (ref 70–99)
Potassium: 3.4 mmol/L — ABNORMAL LOW (ref 3.5–5.1)
Sodium: 133 mmol/L — ABNORMAL LOW (ref 135–145)

## 2021-06-05 LAB — GLUCOSE, CAPILLARY
Glucose-Capillary: 100 mg/dL — ABNORMAL HIGH (ref 70–99)
Glucose-Capillary: 135 mg/dL — ABNORMAL HIGH (ref 70–99)

## 2021-06-05 LAB — MAGNESIUM: Magnesium: 1.6 mg/dL — ABNORMAL LOW (ref 1.7–2.4)

## 2021-06-05 LAB — PHOSPHORUS: Phosphorus: 1.5 mg/dL — ABNORMAL LOW (ref 2.5–4.6)

## 2021-06-05 MED ORDER — MAGNESIUM SULFATE 2 GM/50ML IV SOLN
2.0000 g | Freq: Once | INTRAVENOUS | Status: AC
  Administered 2021-06-05: 06:00:00 2 g via INTRAVENOUS
  Filled 2021-06-05: qty 50

## 2021-06-05 MED ORDER — MAGNESIUM SULFATE 2 GM/50ML IV SOLN: 2.0000 g | Freq: Once | INTRAVENOUS | Status: DC

## 2021-06-05 MED ORDER — POTASSIUM CHLORIDE ER 20 MEQ PO TBCR: 20.0000 meq | EXTENDED_RELEASE_TABLET | Freq: Every day | ORAL | 3 refills | Status: DC

## 2021-06-05 MED ORDER — MIDODRINE HCL 10 MG PO TABS: 10.0000 mg | ORAL_TABLET | Freq: Three times a day (TID) | ORAL | 0 refills | Status: DC

## 2021-06-05 MED ORDER — HEPARIN SOD (PORK) LOCK FLUSH 100 UNIT/ML IV SOLN
500.0000 [IU] | Freq: Once | INTRAVENOUS | Status: AC
  Administered 2021-06-05: 14:00:00 500 [IU] via INTRAVENOUS
  Filled 2021-06-05: qty 5

## 2021-06-05 MED ORDER — POTASSIUM CHLORIDE CRYS ER 20 MEQ PO TBCR
40.0000 meq | EXTENDED_RELEASE_TABLET | Freq: Once | ORAL | Status: AC
  Administered 2021-06-05: 08:00:00 40 meq via ORAL
  Filled 2021-06-05: qty 2

## 2021-06-05 MED ORDER — AMOXICILLIN-POT CLAVULANATE 875-125 MG PO TABS: 1.0000 | ORAL_TABLET | Freq: Two times a day (BID) | ORAL | 0 refills | Status: AC

## 2021-06-05 MED ORDER — MAGNESIUM OXIDE -MG SUPPLEMENT 400 (240 MG) MG PO TABS: 400.0000 mg | ORAL_TABLET | Freq: Two times a day (BID) | ORAL | 3 refills | Status: DC

## 2021-06-05 MED ORDER — GERHARDT'S BUTT CREAM
TOPICAL_CREAM | Freq: Two times a day (BID) | CUTANEOUS | Status: DC
  Filled 2021-06-05: qty 1

## 2021-06-05 MED ORDER — POTASSIUM CHLORIDE CRYS ER 20 MEQ PO TBCR: 40.0000 meq | EXTENDED_RELEASE_TABLET | ORAL | Status: DC

## 2021-06-05 NOTE — Consult Note (Signed)
[facility] Nurse wound consult note Consultation was completed by review of records, images and assistance from the bedside nurse/clinical staff.   Reason for Consult: pressure injury Wound type: MASD (moisture associated skin damage) from frequent diarrhea, skin rash from chemotherapy   Pressure Injury POA: NA Wound bed: patchy partial thickness skin loss in the inner gluteal fold Drainage (amount, consistency, odor) none Periwound: intact Dressing procedure/placement/frequency:  Gerhardt's butt cream to affected areas BID and PRN after BMs Send home with patient for use.   Discussed POC with patient and bedside nurse.  Re consult if needed, will not follow at this time. Thanks  Lamika Connolly [employer], RN,CWOCN, CNS, [place] [phone])

## 2021-06-05 NOTE — Progress Notes (Signed)
Discharge paperwork and instructions provided to patient. All pt belongings returned. Right chest port heparin locked and deaccessed. No site complications. Site clean, dry, and intact. Patient dressed and transported via wheelchair to main entrance with NT.

## 2021-06-05 NOTE — TOC Progression Note (Signed)
Transition of Care [facility]) - Progression Note    Patient Details  Name: Henry Johns MRN: [id number] Date of Birth: Dec 06, 1960  Transition of [facility]) CM/SW Contact  Ihor Gully, LCSW Phone Number: 06/05/2021, 1:40 PM  Clinical Narrative:    Discussed with patient affordability of medications as he mentioned to attending as an issue.  Some of his medications are over $200, he did not know the names of the medications, however he said they were prescribed by Dr. Raliegh Ip. Message sent to oncology navigator RN and swkr regarding feasibility of prescription assistance plans.      Barriers to Discharge: Continued Medical Work up  Expected Discharge Plan and Services           Expected Discharge Date: 06/05/21                                     Social Determinants of Health (SDOH) Interventions    Readmission Risk Interventions No flowsheet data found.

## 2021-06-05 NOTE — Progress Notes (Signed)
Pharmacy Antibiotic Note  Henry Johns is a 60 y.o. male admitted on 05/31/2021 with  intra-abdominal infection .  Pharmacy has been consulted for Zosyn dosing.  Plan: Continue Zosyn 3.375g IV q8h (4 hour infusion). Monitor labs, c/s, and patient improvement.  Height: 5\' 8"  (172.7 cm) Weight: 77.1 kg (169 lb 15.6 oz) IBW/kg (Calculated) : 68.4  Temp (24hrs), Avg:98 F (36.7 C), Min:97.7 F (36.5 C), Max:98.2 F (36.8 C)  Recent Labs  Lab 05/31/21 2026 06/02/21 0425 06/03/21 0405 06/04/21 0259 06/05/21 0418  WBC 15.2* 17.1* 9.9 6.0 4.9  CREATININE 0.55* 0.60* 0.56* 0.55* 0.54*     Estimated Creatinine Clearance: 96.2 mL/min (A) (by C-G formula based on SCr of 0.54 mg/dL (L)).    No Known Allergies  Antimicrobials this admission: Zosyn 11/24 >>   Microbiology results: COVID neg  Thank you for allowing pharmacy to be a part of this patient's care.  Ramond Craver 06/05/2021 7:55 AM

## 2021-06-05 NOTE — Discharge Summary (Signed)
Physician Discharge Summary  Henry Johns ATF:[id number] DOB: 08/01/1960 DOA: 05/31/2021  PCP: [facility] date: 05/31/2021  Discharge date: 06/05/2021  Admitted From:Home  Disposition:  Home  Recommendations for Outpatient Follow-up:  Follow up with PCP in 1-2 weeks Continue Augmentin as prescribed for 5 more days to finish course of treatment for colitis Continue midodrine as prescribed for blood pressure support and follow-up with PCP regarding blood pressure readings Continue magnesium and potassium supplementation as refilled Hold lactulose and MiraLAX for now until diarrhea fully resolves  Home Health: None Equipment/Devices: None  Discharge Condition:Stable  CODE STATUS: Full  Diet recommendation: Heart Healthy/carb modified  Brief/Interim Summary:  Henry Johns  is a 60 y.o. male, with history of diabetes mellitus type 2, metastatic cancer getting chemo with next chemo being Monday, hypertension, and more presents ED with a chief complaint of abdominal pain.  He has been admitted with colitis noted on CT abdomen and pelvis and was empirically maintained on IV Zosyn with improvement in his symptoms noted.  He still continues to have some mild diarrhea, but no longer has any abdominal pain and is tolerating a full diet.  He did have some significant issues with hypotension during the course of his stay and his cortisol levels were within normal limits.  He was noted to have issues with this at home and his atenolol was discontinued approximately 4-5 months previously.  He remained on norepinephrine for blood pressure support and was eventually started on midodrine to assist with weaning off of the pressor.  He will continue to remain on midodrine as prescribed and has never been symptomatic from a blood pressure standpoint.  He will need to hold his chemotherapy session this week and then resume once his infection has resolved.  No other  significant events or concerns noted throughout the course of the stay and recommendations are as noted above.  Discharge Diagnoses:  Principal Problem:   Colitis Active Problems:   Diabetes mellitus with neuropathy (HCC)   Hypokalemia   Hyperlipidemia   Chronic pain  Principal discharge diagnosis: Sepsis secondary to sigmoid colitis along with hypotension acute on chronic.  Discharge Instructions  Discharge Instructions     Diet - low sodium heart healthy   Complete by: As directed    Increase activity slowly   Complete by: As directed    No wound care   Complete by: As directed       Allergies as of 06/05/2021   No Known Allergies      Medication List     STOP taking these medications    atenolol 50 MG tablet Commonly known as: TENORMIN   clindamycin 1 % Swab Commonly known as: CLEOCIN T   clobetasol cream 0.05 % Commonly known as: TEMOVATE   clobetasol ointment 0.05 % Commonly known as: TEMOVATE   doxycycline 100 MG tablet Commonly known as: VIBRA-TABS   furosemide 20 MG tablet Commonly known as: LASIX   hydrocortisone 1 % ointment   Lactulose 20 GM/30ML Soln   multivitamin with minerals Tabs tablet   omeprazole 40 MG capsule Commonly known as: PRILOSEC   polyethylene glycol 17 g packet Commonly known as: MiraLax   Proctofoam HC rectal foam Generic drug: hydrocortisone-pramoxine   sucralfate 1 GM/10ML suspension Commonly known as: CARAFATE       TAKE these medications    acetaminophen 500 MG tablet Commonly known as: TYLENOL Take 1,000 mg by mouth every 6 (six) hours as needed  for moderate pain.   amoxicillin-clavulanate 875-125 MG tablet Commonly known as: Augmentin Take 1 tablet by mouth 2 (two) times daily for 5 days.   fluorouracil CALGB 12458 2,400 mg/m2 in sodium chloride 0.9 % 150 mL Inject 2,400 mg/m2 into the vein over 48 hr.   insulin NPH-regular Human (70-30) 100 UNIT/ML injection Inject 10-12 Units into the skin 2  (two) times daily.   LEUCOVORIN CALCIUM IV Inject 400 mg/m2 into the vein every 14 (fourteen) days.   Lidocaine (Anorectal) 5 % Crea Apply 1 application topically 2 (two) times daily as needed.   lidocaine 5 % ointment Commonly known as: XYLOCAINE Apply 1 application topically in the morning and at bedtime. Apply to both hands twice a day   lidocaine-prilocaine cream Commonly known as: EMLA Apply to affected area once   LORazepam 0.5 MG tablet Commonly known as: ATIVAN Take 1 tablet by mouth twice daily as needed for anxiety   magnesium oxide 400 (240 Mg) MG tablet Commonly known as: MAG-OX Take 1 tablet (400 mg total) by mouth 2 (two) times daily.   midodrine 10 MG tablet Commonly known as: PROAMATINE Take 1 tablet (10 mg total) by mouth 3 (three) times daily with meals.   morphine 15 MG tablet Commonly known as: MSIR Take 1 tablet (15 mg total) by mouth 2 (two) times daily as needed for severe pain.   ondansetron 8 MG tablet Commonly known as: ZOFRAN Take 1 tablet (8 mg total) by mouth every 8 (eight) hours as needed for nausea.   OXALIPLATIN IV Inject 85 mg/m2 into the vein every 14 (fourteen) days.   pentoxifylline 400 MG CR tablet Commonly known as: TRENTAL Take by mouth.   Potassium Chloride ER 20 MEQ Tbcr Commonly known as: K-Tab Take 20 mEq by mouth daily.   prochlorperazine 10 MG tablet Commonly known as: COMPAZINE Take 1 tablet by mouth every 6 (six) hours as needed.   silver sulfADIAZINE 1 % cream Commonly known as: SILVADENE Apply 1 application topically daily.   simvastatin 20 MG tablet Commonly known as: ZOCOR Take 20 mg by mouth every evening.   temazepam 15 MG capsule Commonly known as: RESTORIL TAKE 1 CAPSULE BY MOUTH AT BEDTIME AS NEEDED FOR SLEEP        Follow-up Information     [facility]. Schedule an appointment as soon as possible for a visit in 1 week(s).   Contact information: [address]  [place] [address] [phone]                No Known Allergies  Consultations: None   Procedures/Studies: CT ABDOMEN PELVIS W CONTRAST  Result Date: 06/01/2021 CLINICAL DATA:  Abdominal pain.  Metastatic colon cancer. EXAM: CT ABDOMEN AND PELVIS WITH CONTRAST TECHNIQUE: Multidetector CT imaging of the abdomen and pelvis was performed using the standard protocol following bolus administration of intravenous contrast. CONTRAST:  12mL OMNIPAQUE IOHEXOL 300 MG/ML  SOLN COMPARISON:  CT of the chest abdomen pelvis dated 04/19/2021. FINDINGS: Lower chest: There is a 5 mm nodule in the right middle lobe. Right lung base atelectasis. The visualized left lung is clear. Partially visualized central venous line with tip at the cavoatrial junction. No intra-abdominal free air. Moderate ascites, increased since the prior CT. Hepatobiliary: Multiple hepatic metastatic disease, relatively similar to prior CT. There is irregularity of the liver contour which may represent cirrhosis or pseudo cirrhosis. No intrahepatic biliary dilatation. The gallbladder is unremarkable. Pancreas: Unremarkable. No pancreatic ductal  dilatation or surrounding inflammatory changes. Spleen: Normal in size without focal abnormality. Adrenals/Urinary Tract: The adrenal glands unremarkable. There is a 1 cm right renal cyst. There is no hydronephrosis on either side. There is symmetric enhancement and excretion of contrast by both kidneys. The urinary bladder is grossly unremarkable. Stomach/Bowel: There is moderate stool throughout the colon. There is segmental thickening of the sigmoid colon which may represent colitis or related to underlying infiltrative process. There is no bowel obstruction. The appendix is unremarkable. Vascular/Lymphatic: Mild aortoiliac atherosclerotic disease. The IVC is unremarkable. No portal venous gas. No definite adenopathy. Reproductive: The prostate and seminal vesicles are grossly  unremarkable. Other: Diffuse subcutaneous edema and anasarca, new since the prior CT. Musculoskeletal: Degenerative changes of the spine. No acute osseous pathology. IMPRESSION: 1. Segmental thickening of the sigmoid colon may represent colitis or related to underlying infiltrative process. No bowel obstruction. Normal appendix. 2. Moderate ascites, increased since the prior CT. 3. Multiple hepatic metastatic disease, relatively similar to prior CT. 4. Diffuse subcutaneous edema and anasarca, new since the prior CT. 5. There is a 5 mm nodule in the right middle lobe. 6. Aortic Atherosclerosis (ICD10-I70.0). Electronically Signed   By: Anner Crete M.D.   On: 06/01/2021 01:24     Discharge Exam: Vitals:   06/05/21 1230 06/05/21 1300  BP: 96/62 92/63  Pulse: 84 99  Resp: 12 (!) 23  Temp:    SpO2: 99% 99%   Vitals:   06/05/21 1130 06/05/21 1200 06/05/21 1230 06/05/21 1300  BP: 93/67 (!) 87/61 96/62 92/63   Pulse: 95 83 84 99  Resp: 15 11 12  (!) 23  Temp:      TempSrc:      SpO2: 100% 99% 99% 99%  Weight:      Height:        General: Pt is alert, awake, not in acute distress Cardiovascular: RRR, S1/S2 +, no rubs, no gallops Respiratory: CTA bilaterally, no wheezing, no rhonchi Abdominal: Soft, NT, ND, bowel sounds + Extremities: no edema, no cyanosis    The results of significant diagnostics from this hospitalization (including imaging, microbiology, ancillary and laboratory) are listed below for reference.     Microbiology: Recent Results (from the past 240 hour(s))  Resp Panel by RT-PCR (Flu A&B, Covid) Nasopharyngeal Swab     Status: None   Collection Time: 06/01/21  2:31 AM   Specimen: Nasopharyngeal Swab; Nasopharyngeal(NP) swabs in vial transport medium  Result Value Ref Range Status   SARS Coronavirus 2 by RT PCR NEGATIVE NEGATIVE Final    Comment: (NOTE) SARS-CoV-2 target nucleic acids are NOT DETECTED.  The SARS-CoV-2 RNA is generally detectable in upper  respiratory specimens during the acute phase of infection. The lowest concentration of SARS-CoV-2 viral copies this assay can detect is 138 copies/mL. A negative result does not preclude SARS-Cov-2 infection and should not be used as the sole basis for treatment or other patient management decisions. A negative result may occur with  improper specimen collection/handling, submission of specimen other than nasopharyngeal swab, presence of viral mutation(s) within the areas targeted by this assay, and inadequate number of viral copies(<138 copies/mL). A negative result must be combined with clinical observations, patient history, and epidemiological information. The expected result is Negative.  Fact Sheet for Patients:  EntrepreneurPulse.com.au  Fact Sheet for Healthcare Providers:  IncredibleEmployment.be  This test is no t yet approved or cleared by the [place] FDA and  has been authorized for detection and/or diagnosis of SARS-CoV-2 by FDA  under an Emergency Use Authorization (EUA). This EUA will remain  in effect (meaning this test can be used) for the duration of the COVID-19 declaration under Section 564(b)(1) of the Act, 21 U.S.C.section 360bbb-3(b)(1), unless the authorization is terminated  or revoked sooner.       Influenza A by PCR NEGATIVE NEGATIVE Final   Influenza B by PCR NEGATIVE NEGATIVE Final    Comment: (NOTE) The Xpert Xpress SARS-CoV-2/FLU/RSV plus assay is intended as an aid in the diagnosis of influenza from Nasopharyngeal swab specimens and should not be used as a sole basis for treatment. Nasal washings and aspirates are unacceptable for Xpert Xpress SARS-CoV-2/FLU/RSV testing.  Fact Sheet for Patients: EntrepreneurPulse.com.au  Fact Sheet for Healthcare Providers: IncredibleEmployment.be  This test is not yet approved or cleared by the [place] FDA and has been  authorized for detection and/or diagnosis of SARS-CoV-2 by FDA under an Emergency Use Authorization (EUA). This EUA will remain in effect (meaning this test can be used) for the duration of the COVID-19 declaration under Section 564(b)(1) of the Act, 21 U.S.C. section 360bbb-3(b)(1), unless the authorization is terminated or revoked.  Performed at [facility], [address], [place], [place] [address]   MRSA Next Gen by PCR, Nasal     Status: None   Collection Time: 06/01/21  4:02 PM   Specimen: Nasal Mucosa; Nasal Swab  Result Value Ref Range Status   MRSA by PCR Next Gen NOT DETECTED NOT DETECTED Final    Comment: (NOTE) The GeneXpert MRSA Assay (FDA approved for NASAL specimens only), is one component of a comprehensive MRSA colonization surveillance program. It is not intended to diagnose MRSA infection nor to guide or monitor treatment for MRSA infections. Test performance is not FDA approved in patients less than 71 years old. Performed at [facility], [address], [place], [place] [address]      Labs: BNP (last 3 results) No results for input(s): BNP in the last 8760 hours. Basic Metabolic Panel: Recent Labs  Lab 05/31/21 2026 06/02/21 0425 06/03/21 0405 06/04/21 0259 06/05/21 0418  NA 136 135 133* 131* 133*  K 3.4* 2.8* 3.1* 3.2* 3.4*  CL 105 106 106 105 108  CO2 22 21* 22 21* 21*  GLUCOSE 195* 155* 133* 164* 108*  BUN 17 14 11 9 8   CREATININE 0.55* 0.60* 0.56* 0.55* 0.54*  CALCIUM 7.8* 7.4* 7.4* 7.2* 7.3*  MG 2.0 1.7 1.7 1.7 1.6*  PHOS  --   --   --   --  1.5*   Liver Function Tests: Recent Labs  Lab 05/31/21 2026 06/02/21 0425  AST 32 24  ALT 16 13  ALKPHOS 315* 223*  BILITOT 1.2 1.5*  PROT 5.2* 4.2*  ALBUMIN 2.7* 2.0*   Recent Labs  Lab 05/31/21 2026  LIPASE 30   No results for input(s): AMMONIA in the last 168 hours. CBC: Recent Labs  Lab 05/31/21 2026 06/02/21 0425 06/03/21 0405 06/04/21 0259 06/05/21 0418  WBC 15.2* 17.1* 9.9  6.0 4.9  NEUTROABS  --  13.4*  --   --   --   HGB 11.2* 9.4* 8.9* 8.4* 8.6*  HCT 33.0* 28.0* 27.0* 25.0* 26.6*  MCV 93.8 95.2 95.4 96.2 96.7  PLT 132* 114* 116* 107* 109*   Cardiac Enzymes: No results for input(s): CKTOTAL, CKMB, CKMBINDEX, TROPONINI in the last 168 hours. BNP: Invalid input(s): POCBNP CBG: Recent Labs  Lab 06/04/21 1113 06/04/21 1627 06/04/21 2040 06/05/21 0720 06/05/21 1113  GLUCAP 133*  107* 137* 100* 135*   D-Dimer No results for input(s): DDIMER in the last 72 hours. Hgb A1c No results for input(s): HGBA1C in the last 72 hours. Lipid Profile No results for input(s): CHOL, HDL, LDLCALC, TRIG, CHOLHDL, LDLDIRECT in the last 72 hours. Thyroid function studies No results for input(s): TSH, T4TOTAL, T3FREE, THYROIDAB in the last 72 hours.  Invalid input(s): FREET3 Anemia work up Recent Labs    06/04/21 0956  VITAMINB12 2,224*  FOLATE 17.7  FERRITIN 268  TIBC 159*  IRON 44*  RETICCTPCT 2.9   Urinalysis    Component Value Date/Time   COLORURINE AMBER (A) 06/01/2021 0235   APPEARANCEUR CLEAR 06/01/2021 0235   LABSPEC >1.030 (H) 06/01/2021 0235   PHURINE 5.5 06/01/2021 0235   GLUCOSEU 100 (A) 06/01/2021 0235   HGBUR NEGATIVE 06/01/2021 0235   BILIRUBINUR MODERATE (A) 06/01/2021 0235   KETONESUR NEGATIVE 06/01/2021 0235   PROTEINUR NEGATIVE 06/01/2021 0235   NITRITE NEGATIVE 06/01/2021 0235   LEUKOCYTESUR NEGATIVE 06/01/2021 0235   Sepsis Labs Invalid input(s): PROCALCITONIN,  WBC,  LACTICIDVEN Microbiology Recent Results (from the past 240 hour(s))  Resp Panel by RT-PCR (Flu A&B, Covid) Nasopharyngeal Swab     Status: None   Collection Time: 06/01/21  2:31 AM   Specimen: Nasopharyngeal Swab; Nasopharyngeal(NP) swabs in vial transport medium  Result Value Ref Range Status   SARS Coronavirus 2 by RT PCR NEGATIVE NEGATIVE Final    Comment: (NOTE) SARS-CoV-2 target nucleic acids are NOT DETECTED.  The SARS-CoV-2 RNA is generally detectable  in upper respiratory specimens during the acute phase of infection. The lowest concentration of SARS-CoV-2 viral copies this assay can detect is 138 copies/mL. A negative result does not preclude SARS-Cov-2 infection and should not be used as the sole basis for treatment or other patient management decisions. A negative result may occur with  improper specimen collection/handling, submission of specimen other than nasopharyngeal swab, presence of viral mutation(s) within the areas targeted by this assay, and inadequate number of viral copies(<138 copies/mL). A negative result must be combined with clinical observations, patient history, and epidemiological information. The expected result is Negative.  Fact Sheet for Patients:  EntrepreneurPulse.com.au  Fact Sheet for Healthcare Providers:  IncredibleEmployment.be  This test is no t yet approved or cleared by the [place] FDA and  has been authorized for detection and/or diagnosis of SARS-CoV-2 by FDA under an Emergency Use Authorization (EUA). This EUA will remain  in effect (meaning this test can be used) for the duration of the COVID-19 declaration under Section 564(b)(1) of the Act, 21 U.S.C.section 360bbb-3(b)(1), unless the authorization is terminated  or revoked sooner.       Influenza A by PCR NEGATIVE NEGATIVE Final   Influenza B by PCR NEGATIVE NEGATIVE Final    Comment: (NOTE) The Xpert Xpress SARS-CoV-2/FLU/RSV plus assay is intended as an aid in the diagnosis of influenza from Nasopharyngeal swab specimens and should not be used as a sole basis for treatment. Nasal washings and aspirates are unacceptable for Xpert Xpress SARS-CoV-2/FLU/RSV testing.  Fact Sheet for Patients: EntrepreneurPulse.com.au  Fact Sheet for Healthcare Providers: IncredibleEmployment.be  This test is not yet approved or cleared by the [place] FDA and has  been authorized for detection and/or diagnosis of SARS-CoV-2 by FDA under an Emergency Use Authorization (EUA). This EUA will remain in effect (meaning this test can be used) for the duration of the COVID-19 declaration under Section 564(b)(1) of the Act, 21 U.S.C. section 360bbb-3(b)(1), unless the authorization  is terminated or revoked.  Performed at [facility], [address] [place], [place] [address]   MRSA Next Gen by PCR, Nasal     Status: None   Collection Time: 06/01/21  4:02 PM   Specimen: Nasal Mucosa; Nasal Swab  Result Value Ref Range Status   MRSA by PCR Next Gen NOT DETECTED NOT DETECTED Final    Comment: (NOTE) The GeneXpert MRSA Assay (FDA approved for NASAL specimens only), is one component of a comprehensive MRSA colonization surveillance program. It is not intended to diagnose MRSA infection nor to guide or monitor treatment for MRSA infections. Test performance is not FDA approved in patients less than 39 years old. Performed at [facility], [address], [place], [place] [address]      Time coordinating discharge: 35 minutes  SIGNED:   Rodena Goldmann, DO [facility] 06/05/2021, 1:08 PM  If 7PM-7AM, please contact night-coverage [email or web address]

## 2021-06-05 NOTE — Consult Note (Signed)
I have placed a request via Secure Chat to Dr.Shah requesting photos of the wound areas of concern to be placed in the EMR.    [place], [place], [place]

## 2021-06-07 ENCOUNTER — Encounter (HOSPITAL_COMMUNITY): Payer: Medicaid Other

## 2021-06-07 ENCOUNTER — Encounter (HOSPITAL_COMMUNITY): Payer: Self-pay | Admitting: *Deleted

## 2021-06-07 NOTE — Progress Notes (Signed)
Henry Johns was contacted by telephone to verify understanding of discharge instructions status post their most recent discharge from the hospital on the date:  06/05/21.  Inpatient discharge AVS was re-reviewed with patient, along with cancer center appointments.  Verification of understanding for oncology specific follow-up was validated using the Teach Back method.    Transportation to appointments were confirmed for the patient as being self/caregiver.  Henry Johns's questions were addressed to their satisfaction upon completion of this post discharge follow-up call for outpatient oncology.

## 2021-06-10 ENCOUNTER — Emergency Department (HOSPITAL_COMMUNITY): Payer: Medicaid Other

## 2021-06-10 ENCOUNTER — Encounter (HOSPITAL_COMMUNITY): Payer: Self-pay | Admitting: *Deleted

## 2021-06-10 ENCOUNTER — Emergency Department (HOSPITAL_COMMUNITY)
Admission: EM | Admit: 2021-06-10 | Discharge: 2021-06-10 | Disposition: A | Discharge status: Home / Self Care | Payer: Medicaid Other | Attending: Emergency Medicine | Admitting: Emergency Medicine

## 2021-06-10 ENCOUNTER — Other Ambulatory Visit: Payer: Self-pay

## 2021-06-10 DIAGNOSIS — E1140 Type 2 diabetes mellitus with diabetic neuropathy, unspecified: Secondary | ICD-10-CM | POA: Diagnosis not present

## 2021-06-10 DIAGNOSIS — I10 Essential (primary) hypertension: Secondary | ICD-10-CM | POA: Insufficient documentation

## 2021-06-10 DIAGNOSIS — Z794 Long term (current) use of insulin: Secondary | ICD-10-CM | POA: Diagnosis not present

## 2021-06-10 DIAGNOSIS — Z8505 Personal history of malignant neoplasm of liver: Secondary | ICD-10-CM | POA: Insufficient documentation

## 2021-06-10 DIAGNOSIS — R609 Edema, unspecified: Secondary | ICD-10-CM

## 2021-06-10 DIAGNOSIS — Z79899 Other long term (current) drug therapy: Secondary | ICD-10-CM | POA: Diagnosis not present

## 2021-06-10 DIAGNOSIS — R600 Localized edema: Secondary | ICD-10-CM | POA: Insufficient documentation

## 2021-06-10 DIAGNOSIS — R140 Abdominal distension (gaseous): Secondary | ICD-10-CM | POA: Diagnosis not present

## 2021-06-10 DIAGNOSIS — Z85038 Personal history of other malignant neoplasm of large intestine: Secondary | ICD-10-CM | POA: Diagnosis not present

## 2021-06-10 DIAGNOSIS — R2243 Localized swelling, mass and lump, lower limb, bilateral: Secondary | ICD-10-CM | POA: Diagnosis present

## 2021-06-10 HISTORY — DX: Malignant neoplasm of colon, unspecified: C18.9

## 2021-06-10 LAB — CBC WITH DIFFERENTIAL/PLATELET
Abs Immature Granulocytes: 0.02 10*3/uL (ref 0.00–0.07)
Basophils Absolute: 0.1 10*3/uL (ref 0.0–0.1)
Basophils Relative: 2 %
Eosinophils Absolute: 0.4 10*3/uL (ref 0.0–0.5)
Eosinophils Relative: 9 %
HCT: 28.2 % — ABNORMAL LOW (ref 39.0–52.0)
Hemoglobin: 9.3 g/dL — ABNORMAL LOW (ref 13.0–17.0)
Immature Granulocytes: 0 %
Lymphocytes Relative: 35 %
Lymphs Abs: 1.8 10*3/uL (ref 0.7–4.0)
MCH: 32.5 pg (ref 26.0–34.0)
MCHC: 33 g/dL (ref 30.0–36.0)
MCV: 98.6 fL (ref 80.0–100.0)
Monocytes Absolute: 0.7 10*3/uL (ref 0.1–1.0)
Monocytes Relative: 15 %
Neutro Abs: 2 10*3/uL (ref 1.7–7.7)
Neutrophils Relative %: 39 %
Platelets: 224 10*3/uL (ref 150–400)
RBC: 2.86 MIL/uL — ABNORMAL LOW (ref 4.22–5.81)
RDW: 18.2 % — ABNORMAL HIGH (ref 11.5–15.5)
WBC: 5 10*3/uL (ref 4.0–10.5)
nRBC: 0 % (ref 0.0–0.2)

## 2021-06-10 LAB — COMPREHENSIVE METABOLIC PANEL
ALT: 12 U/L (ref 0–44)
AST: 29 U/L (ref 15–41)
Albumin: 2.1 g/dL — ABNORMAL LOW (ref 3.5–5.0)
Alkaline Phosphatase: 273 U/L — ABNORMAL HIGH (ref 38–126)
Anion gap: 5 (ref 5–15)
BUN: 7 mg/dL (ref 6–20)
CO2: 25 mmol/L (ref 22–32)
Calcium: 7.7 mg/dL — ABNORMAL LOW (ref 8.9–10.3)
Chloride: 106 mmol/L (ref 98–111)
Creatinine, Ser: 0.56 mg/dL — ABNORMAL LOW (ref 0.61–1.24)
GFR, Estimated: >60 mL/min (ref >60)
Glucose, Bld: 95 mg/dL (ref 70–99)
Potassium: 3.8 mmol/L (ref 3.5–5.1)
Sodium: 136 mmol/L (ref 135–145)
Total Bilirubin: 0.2 mg/dL — ABNORMAL LOW (ref 0.3–1.2)
Total Protein: 4.6 g/dL — ABNORMAL LOW (ref 6.5–8.1)

## 2021-06-10 LAB — PROTIME-INR
INR: 1.3 — ABNORMAL HIGH (ref 0.8–1.2)
Prothrombin Time: 16.2 seconds — ABNORMAL HIGH (ref 11.4–15.2)

## 2021-06-10 MED ORDER — ONDANSETRON HCL 4 MG/2ML IJ SOLN
4.0000 mg | Freq: Once | INTRAMUSCULAR | Status: AC
  Administered 2021-06-10: 4 mg via INTRAVENOUS
  Filled 2021-06-10: qty 2

## 2021-06-10 MED ORDER — HEPARIN SOD (PORK) LOCK FLUSH 100 UNIT/ML IV SOLN
500.0000 [IU] | Freq: Once | INTRAVENOUS | Status: AC
  Administered 2021-06-10: 500 [IU]
  Filled 2021-06-10: qty 5

## 2021-06-10 NOTE — Discharge Instructions (Addendum)
Take the furosemide 20 mg a day.  Continue to take the midodrine.  Hopefully this can help get some of the fluid off.  Watch for lightheadedness or dizziness.  Watch for increased trouble breathing.  Follow-up with your doctor soon as possible

## 2021-06-10 NOTE — ED Provider Notes (Signed)
[facility] Provider Note   CSN: [id number] Arrival date & time: 06/10/21  5038     History No chief complaint on file.   Henry Johns is a 61 y.o. male.  HPI Patient presents with swelling in both his legs and abdomen.  Recently got out of the hospital on Monday with today being Saturday.  Was admitted for hypotension and colitis.  Has colon cancer metastatic to his liver and has ascites and peripheral edema.  Has been on midodrine for hypotension.  Has been off his Lasix and just had 1 day back on it.  Not having difficulty breathing.  States his abdomen is feeling better but is more swollen.  Patient states he is worried about a blood clot because it feels as if the swelling is moving up his leg.  Particularly has pain on the right side.    Past Medical History:  Diagnosis Date   Diabetes mellitus without complication ([place])    Hypertension     Patient Active Problem List   Diagnosis Date Noted   Colitis 06/01/2021   Hypokalemia 06/01/2021   Hyperlipidemia 06/01/2021   Chronic pain 06/01/2021   Metastasis to liver ([place]) 01/04/2021   Colon cancer metastasized to liver ([place]) 01/04/2021   Other constipation 01/04/2021   Rectal bleeding 01/04/2021   Nausea without vomiting 01/04/2021   Cancer associated pain 01/04/2021   Diabetes mellitus with neuropathy [facility])     Past Surgical History:  Procedure Laterality Date   BIOPSY  01/13/2021   Procedure: BIOPSY;  Surgeon: Harvel Quale, MD;  Location: [facility];  Service: Gastroenterology;;   CATARACT EXTRACTION Right    COLONOSCOPY WITH PROPOFOL N/A 01/13/2021   Procedure: COLONOSCOPY WITH PROPOFOL;  Surgeon: Harvel Quale, MD;  Location: [facility];  Service: Gastroenterology;  Laterality: N/A;  11:05   ESOPHAGOGASTRODUODENOSCOPY (EGD) WITH PROPOFOL N/A 01/13/2021   Procedure: ESOPHAGOGASTRODUODENOSCOPY (EGD) WITH PROPOFOL;  Surgeon: Harvel Quale, MD;  Location: [facility];  Service: Gastroenterology;  Laterality: N/A;   IR IMAGING GUIDED PORT INSERTION  01/24/2021   LIVER BIOPSY  01/10/2021   U/S guided   POLYPECTOMY  01/13/2021   Procedure: POLYPECTOMY;  Surgeon: Harvel Quale, MD;  Location: [facility];  Service: Gastroenterology;;       Family History  Problem Relation Age of Onset   Cancer Father 27       prostate ca    Social History   Tobacco Use   Smoking status: Never   Smokeless tobacco: Never  Vaping Use   Vaping Use: Never used  Substance Use Topics   Alcohol use: Not Currently   Drug use: Not Currently    Home Medications Prior to Admission medications   Medication Sig Start Date End Date Taking? Authorizing Provider  acetaminophen (TYLENOL) 500 MG tablet Take 1,000 mg by mouth every 6 (six) hours as needed for moderate pain. Patient not taking: Reported on 06/01/2021    [provider]  amoxicillin-clavulanate (AUGMENTIN) 875-125 MG tablet Take 1 tablet by mouth 2 (two) times daily for 5 days. 06/05/21 06/10/21  Heath Lark D, DO  fluorouracil CALGB 88280 2,400 mg/m2 in sodium chloride 0.9 % 150 mL Inject 2,400 mg/m2 into the vein over 48 hr.    [provider]  insulin NPH-regular Human (70-30) 100 UNIT/ML injection Inject 10-12 Units into the skin 2 (two) times daily. Patient not taking: Reported on 06/01/2021    [provider]  LEUCOVORIN  CALCIUM IV Inject 400 mg/m2 into the vein every 14 (fourteen) days. 01/25/21   [provider]  lidocaine (XYLOCAINE) 5 % ointment Apply 1 application topically in the morning and at bedtime. Apply to both hands twice a day 04/20/21   Derek Jack, MD  Lidocaine, Anorectal, 5 % CREA Apply 1 application topically 2 (two) times daily as needed. 05/02/21   Derek Jack, MD  lidocaine-prilocaine (EMLA) cream Apply to affected area once 01/18/21   Heath Lark, MD  LORazepam (ATIVAN) 0.5 MG tablet Take 1 tablet by mouth twice  daily as needed for anxiety 03/20/21   Derek Jack, MD  magnesium oxide (MAG-OX) 400 (240 Mg) MG tablet Take 1 tablet (400 mg total) by mouth 2 (two) times daily. 06/05/21   Manuella Ghazi, Pratik D, DO  midodrine (PROAMATINE) 10 MG tablet Take 1 tablet (10 mg total) by mouth 3 (three) times daily with meals. 06/05/21 07/05/21  Manuella Ghazi, Pratik D, DO  morphine (MSIR) 15 MG tablet Take 1 tablet (15 mg total) by mouth 2 (two) times daily as needed for severe pain. 04/12/21   Derek Jack, MD  ondansetron (ZOFRAN) 8 MG tablet Take 1 tablet (8 mg total) by mouth every 8 (eight) hours as needed for nausea. 02/27/21   Derek Jack, MD  OXALIPLATIN IV Inject 85 mg/m2 into the vein every 14 (fourteen) days. 01/25/21   [provider]  pentoxifylline (TRENTAL) 400 MG CR tablet Take by mouth. 04/05/21   [provider]  Potassium Chloride ER (K-TAB) 20 MEQ TBCR Take 20 mEq by mouth daily. 06/05/21   Manuella Ghazi, Pratik D, DO  prochlorperazine (COMPAZINE) 10 MG tablet Take 1 tablet by mouth every 6 (six) hours as needed. Patient not taking: Reported on 06/01/2021 01/04/21   [provider]  silver sulfADIAZINE (SILVADENE) 1 % cream Apply 1 application topically daily. 05/22/21   Derek Jack, MD  simvastatin (ZOCOR) 20 MG tablet Take 20 mg by mouth every evening. 01/23/21   [provider]  temazepam (RESTORIL) 15 MG capsule TAKE 1 CAPSULE BY MOUTH AT BEDTIME AS NEEDED FOR SLEEP 02/14/21   Derek Jack, MD    Allergies    Patient has no known allergies.  Review of Systems   Review of Systems  Constitutional:  Positive for fatigue. Negative for appetite change.  HENT:  Negative for congestion.   Respiratory:  Negative for shortness of breath.   Cardiovascular:  Positive for leg swelling.  Gastrointestinal:  Positive for abdominal distention. Negative for abdominal pain.  Musculoskeletal:  Negative for back pain.  Skin:  Positive for color change.   Neurological:  Negative for weakness.  Psychiatric/Behavioral:  Negative for confusion.    Physical Exam Updated Vital Signs There were no vitals taken for this visit.  Physical Exam Vitals and nursing note reviewed.  HENT:     Head: Atraumatic.  Eyes:     Pupils: Pupils are equal, round, and reactive to light.  Cardiovascular:     Rate and Rhythm: Regular rhythm.  Pulmonary:     Breath sounds: No wheezing or rhonchi.  Abdominal:     General: There is distension.     Comments: Distention from his ascites.  Minimal tenderness.  Musculoskeletal:     Cervical back: Neck supple.     Right lower leg: Edema present.     Left lower leg: Edema present.     Comments: Moderate to severe pitting edema bilateral lower extremities.  Some mild skin changes.  Slight erythema but not  frank cellulitis.  Skin:    Capillary Refill: Capillary refill takes less than 2 seconds.  Neurological:     Mental Status: He is alert and oriented to person, place, and time.    ED Results / Procedures / Treatments   Labs (all labs ordered are listed, but only abnormal results are displayed) Labs Reviewed - No data to display  EKG None  Radiology No results found.  Procedures Procedures   Medications Ordered in ED Medications - No data to display  ED Course  I have reviewed the triage vital signs and the nursing notes.  Pertinent labs & imaging results that were available during my care of the patient were reviewed by me and considered in my medical decision making (see chart for details).    MDM Rules/Calculators/A&P                           Patient with edema.  Worse on legs.  History of metastatic cancer to liver and has ascites/anasarca.  Recent admission the hospital but has been hypotensive.  Just recently started back on Lasix.  Only had 1 dose yesterday.  Doppler done reassuring.  Chest x-ray reassuring.  Blood pressure maintained where it has been.  Discussed with Dr. Brigitte Pulse, who had  recently discharged the patient was familiar with him.  After discussion he feels that he was be stable for discharge home.  Likely needs more diuretics but will gently continue his home medications.  Do not feel as if he is necessarily failed outpatient management yet and want to be careful with larger doses with his hypotension.  Doubt severe intra-abdominal pathology.  Patient says abdomen is feeling better compared to before.  Will discharge home. Final Clinical Impression(s) / ED Diagnoses Final diagnoses:  None    Rx / DC Orders ED Discharge Orders     None        Davonna Belling, MD 06/10/21 [phone]

## 2021-06-10 NOTE — ED Notes (Signed)
Dc instructions reviewed with pt. Family at bedside to assist in transporting pt home. Pt stood and got into wheelchair and wheeled out by son. No questions or concerns at this time. Stated he will call PCP on Monday to follow up.

## 2021-06-10 NOTE — ED Triage Notes (Signed)
Pt c/o bilateral leg swelling, worsening yesterday. Denies SOB.

## 2021-06-12 NOTE — Progress Notes (Shared)
Henry Johns, [place] [address]   CLINIC:  Medical Oncology/Hematology  PCP:  [facility] [address] / [place] [address] [phone]   REASON FOR VISIT:  Follow-up for ***  PRIOR THERAPY: ***  NGS Results: ***  CURRENT THERAPY: ***  BRIEF ONCOLOGIC HISTORY:  Oncology History Overview Note  MMR normal Foundation One: low mutation burden   Metastasis to liver ([place])  01/04/2021 Initial Diagnosis   Metastasis to liver ([place])   01/25/2021 -  Chemotherapy   Patient is on Treatment Plan : COLORECTAL FOLFOX q14d     Colon cancer metastasized to liver ([place])  12/26/2020 Imaging   Ct abdomen and pelvis 1. Extensive hepatic metastatic disease and retroperitoneal adenopathy. 2. A 7 mm right lung base nodule, most consistent with metastatic disease. 3. No bowel obstruction. Normal appendix. 4. Aortic Atherosclerosis (ICD10-I70.0).   12/26/2020 Imaging   CT chest 1. Several small lung nodules consistent with metastatic disease. The primary malignancy remains unclear. The liver morphology suggests underlying cirrhosis, and multifocal hepatocellular carcinoma should be included in the differential diagnosis. 2. No acute abnormality in the chest.     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 LIVER, NEEDLE CORE BIOPSY:  -  Metastatic adenocarcinoma  -  See comment   COMMENT:   The neoplastic cells are positive for cytokeratin 20 and CDX2 but negative for cytokeratin 7, TTF-1, PSA and prostein.  The combined morphology and immunophenotype are consistent with metastasis from a  gastrointestinal primary.  Dr. Saralyn Pilar reviewed the case and agrees with the above diagnosis.     01/13/2021 Procedure   Colonoscopy - Two 3 to 5 mm polyps in the transverse colon, removed with a cold snare. Resected and retrieved. - Malignant partially obstructing tumor at 17 cm  proximal to the anus. Biopsied. Tattooed. - The distal rectum and anal verge are normal on retroflexion view.   01/13/2021 Cancer Staging   Staging form: Colon and Rectum, AJCC 8th Edition - Clinical stage from 01/13/2021: Stage IVB (cTX, cN0, pM1b) - Signed by Heath Lark, MD on 01/13/2021 Stage prefix: Initial diagnosis Total positive nodes: 0    01/13/2021 Procedure   Colonoscopy - Two 3 to 5 mm polyps in the transverse colon, removed with a cold snare. Resected and retrieved. - Malignant partially obstructing tumor at 17 cm proximal to the anus. Biopsied. Tattooed. - The distal rectum and anal verge are normal on retroflexion view.   01/13/2021 Procedure   EGD - Normal esophagus. - Z-line regular, 40 cm from the incisors. - Normal stomach. - Nodular mucosa in   01/13/2021 Pathology Results   A. DUODENUM, NODULE, BIOPSY:  - Peptic duodenitis.  - No dysplasia or malignancy.   B. COLON, SIGMOID, MASS, BIOPSY:  - Invasive adenocarcinoma.   C. COLON, TRANSVERSE, POLYPECTOMY:  - Fragment of ulcerated tissue with adenocarcinoma, see comment.  - Tubular adenoma (X2 fragments).   COMMENT:   B. MMR will be ordered   01/13/2021 Tumor Marker   Patient's tumor was tested for the following markers: CEA. Results of the tumor marker test revealed 883.   01/24/2021 Procedure   Successful right-sided port placement, with the tip of the catheter in the proximal right atrium.   Plan: Catheter ready for use.   See detailed procedure note with images in PACS. The patient tolerated the procedure well without incident or complication and  was returned to Recovery in stable conditi   01/25/2021 -  Chemotherapy   Patient is on Treatment Plan : COLORECTAL FOLFOX q14d     01/25/2021 Tumor Marker   Patient's tumor was tested for the following markers: CEA. Results of the tumor marker test revealed 1378.     CANCER STAGING:  Cancer Staging  Colon cancer metastasized to liver [facility]) Staging form:  Colon and Rectum, AJCC 8th Edition - Clinical stage from 01/13/2021: Stage IVB (cTX, cN0, pM1b) - Signed by Heath Lark, MD on 01/13/2021   INTERVAL HISTORY:  [name], a 60 y.o. male, returns for routine follow-up and consideration for next cycle of chemotherapy. Henry Johns was last seen on {XX/XX/XXXX}.  Due for cycle #*** of *** today.   Overall, he tells me he has been feeling pretty well. ***  Overall, he feels ready for next cycle of chemo today.    REVIEW OF SYSTEMS:  Review of Systems - Oncology  PAST MEDICAL/SURGICAL HISTORY:  Past Medical History:  Diagnosis Date   Colon cancer ([place])    Diabetes mellitus without complication ([place])    Hypertension    Past Surgical History:  Procedure Laterality Date   BIOPSY  01/13/2021   Procedure: BIOPSY;  Surgeon: Harvel Quale, MD;  [facility];  Service: Gastroenterology;;   CATARACT EXTRACTION Right    COLONOSCOPY WITH PROPOFOL N/A 01/13/2021   Procedure: COLONOSCOPY WITH PROPOFOL;  Surgeon: Harvel Quale, MD;  Location: [facility];  Service: Gastroenterology;  Laterality: N/A;  11:05   ESOPHAGOGASTRODUODENOSCOPY (EGD) WITH PROPOFOL N/A 01/13/2021   Procedure: ESOPHAGOGASTRODUODENOSCOPY (EGD) WITH PROPOFOL;  Surgeon: Harvel Quale, MD;  Location[facility];  Service: Gastroenterology;  Laterality: N/A;   IR IMAGING GUIDED PORT INSERTION  01/24/2021   LIVER BIOPSY  01/10/2021   U/S guided   POLYPECTOMY  01/13/2021   Procedure: POLYPECTOMY;  Surgeon: Harvel Quale, MD;  [facility];  Service: Gastroenterology;;    SOCIAL HISTORY:  Social History   Socioeconomic History   Marital status: Married    Spouse name: Not on file   Number of children: Not on file   Years of education: Not on file   Highest education level: Not on file  Occupational History   Not on file  Tobacco Use   Smoking status: Never   Smokeless tobacco: Never  Vaping Use    Vaping Use: Never used  Substance and Sexual Activity   Alcohol use: Not Currently   Drug use: Not Currently   Sexual activity: Not Currently  Other Topics Concern   Not on file  Social History Narrative   Married, no childer   Social Determinants of Health   Financial Resource Strain: Not on file  Food Insecurity: Not on file  Transportation Needs: No Transportation Needs   Lack of Transportation (Medical): No   Lack of Transportation (Non-Medical): No  Physical Activity: Inactive   Days of Exercise per Week: 0 days   Minutes of Exercise per Session: 0 min  Stress: Not on file  Social Connections: Not on file  Intimate Partner Violence: Not At Risk   Fear of Current or Ex-Partner: No   Emotionally Abused: No   Physically Abused: No   Sexually Abused: No    FAMILY HISTORY:  Family History  Problem Relation Age of Onset   Cancer Father 73       prostate ca    CURRENT MEDICATIONS:  Current Outpatient Medications  Medication Sig Dispense Refill   acetaminophen (TYLENOL) 500 MG tablet Take 1,000 mg by mouth every 6 (six) hours as needed for moderate pain. (Patient not taking: Reported on 06/01/2021)     fluorouracil CALGB 43154 2,400 mg/m2 in sodium chloride 0.9 % 150 mL Inject 2,400 mg/m2 into the vein over 48 hr.     insulin NPH-regular Human (70-30) 100 UNIT/ML injection Inject 10-12 Units into the skin 2 (two) times daily. (Patient not taking: Reported on 06/01/2021)     LEUCOVORIN CALCIUM IV Inject 400 mg/m2 into the vein every 14 (fourteen) days.     lidocaine (XYLOCAINE) 5 % ointment Apply 1 application topically in the morning and at bedtime. Apply to both hands twice a day 35.44 g 0   Lidocaine, Anorectal, 5 % CREA Apply 1 application topically 2 (two) times daily as needed. 30 g 3   lidocaine-prilocaine (EMLA) cream Apply to affected area once 30 g 3   LORazepam (ATIVAN) 0.5 MG tablet Take 1 tablet by mouth twice daily as needed for anxiety 60 tablet 3   magnesium  oxide (MAG-OX) 400 (240 Mg) MG tablet Take 1 tablet (400 mg total) by mouth 2 (two) times daily. 60 tablet 3   midodrine (PROAMATINE) 10 MG tablet Take 1 tablet (10 mg total) by mouth 3 (three) times daily with meals. 90 tablet 0   morphine (MSIR) 15 MG tablet Take 1 tablet (15 mg total) by mouth 2 (two) times daily as needed for severe pain. 60 tablet 0   ondansetron (ZOFRAN) 8 MG tablet Take 1 tablet (8 mg total) by mouth every 8 (eight) hours as needed for nausea. 60 tablet 3   OXALIPLATIN IV Inject 85 mg/m2 into the vein every 14 (fourteen) days.     pentoxifylline (TRENTAL) 400 MG CR tablet Take by mouth.     Potassium Chloride ER (K-TAB) 20 MEQ TBCR Take 20 mEq by mouth daily. 30 tablet 3   prochlorperazine (COMPAZINE) 10 MG tablet Take 1 tablet by mouth every 6 (six) hours as needed. (Patient not taking: Reported on 06/01/2021)     silver sulfADIAZINE (SILVADENE) 1 % cream Apply 1 application topically daily. 50 g 0   simvastatin (ZOCOR) 20 MG tablet Take 20 mg by mouth every evening.     temazepam (RESTORIL) 15 MG capsule TAKE 1 CAPSULE BY MOUTH AT BEDTIME AS NEEDED FOR SLEEP 30 capsule 0   No current facility-administered medications for this visit.    ALLERGIES:  No Known Allergies  PHYSICAL EXAM:  Performance status (ECOG): {CHL ONC MG:[id number]}  There were no vitals filed for this visit. Wt Readings from Last 3 Encounters:  06/10/21 169 lb 15.6 oz (77.1 kg)  06/01/21 169 lb 15.6 oz (77.1 kg)  05/22/21 162 lb (73.5 kg)   Physical Exam  LABORATORY DATA:  I have reviewed the labs as listed.  CBC Latest Ref Rng & Units 06/10/2021 06/05/2021 06/04/2021  WBC 4.0 - 10.5 K/uL 5.0 4.9 6.0  Hemoglobin 13.0 - 17.0 g/dL 9.3(L) 8.6(L) 8.4(L)  Hematocrit 39.0 - 52.0 % 28.2(L) 26.6(L) 25.0(L)  Platelets 150 - 400 K/uL 224 109(L) 107(L)   CMP Latest Ref Rng & Units 06/10/2021 06/05/2021 06/04/2021  Glucose 70 - 99 mg/dL 95 108(H) 164(H)  BUN 6 - 20 mg/dL _0 Creatinine 0.61 -  1.24 mg/dL 0.56(L) 0.54(L) 0.55(L)  Sodium 135 - 145 mmol/L 136 133(L) 131(L)  Potassium 3.5 - 5.1 mmol/L 3.8 3.4(L) 3.2(L)  Chloride 98 - 111 mmol/L  106 108 105  CO2 22 - 32 mmol/L 25 21(L) 21(L)  Calcium 8.9 - 10.3 mg/dL 7.7(L) 7.3(L) 7.2(L)  Total Protein 6.5 - 8.1 g/dL 4.6(L) - -  Total Bilirubin 0.3 - 1.2 mg/dL 0.2(L) - -  Alkaline Phos 38 - 126 U/L 273(H) - -  AST 15 - 41 U/L 29 - -  ALT 0 - 44 U/L 12 - -    DIAGNOSTIC IMAGING:  I have independently reviewed the scans and discussed with the patient. CT ABDOMEN PELVIS W CONTRAST  Result Date: 06/01/2021 CLINICAL DATA:  Abdominal pain.  Metastatic colon cancer. EXAM: CT ABDOMEN AND PELVIS WITH CONTRAST TECHNIQUE: Multidetector CT imaging of the abdomen and pelvis was performed using the standard protocol following bolus administration of intravenous contrast. CONTRAST:  184mL OMNIPAQUE IOHEXOL 300 MG/ML  SOLN COMPARISON:  CT of the chest abdomen pelvis dated 04/19/2021. FINDINGS: Lower chest: There is a 5 mm nodule in the right middle lobe. Right lung base atelectasis. The visualized left lung is clear. Partially visualized central venous line with tip at the cavoatrial junction. No intra-abdominal free air. Moderate ascites, increased since the prior CT. Hepatobiliary: Multiple hepatic metastatic disease, relatively similar to prior CT. There is irregularity of the liver contour which may represent cirrhosis or pseudo cirrhosis. No intrahepatic biliary dilatation. The gallbladder is unremarkable. Pancreas: Unremarkable. No pancreatic ductal dilatation or surrounding inflammatory changes. Spleen: Normal in size without focal abnormality. Adrenals/Urinary Tract: The adrenal glands unremarkable. There is a 1 cm right renal cyst. There is no hydronephrosis on either side. There is symmetric enhancement and excretion of contrast by both kidneys. The urinary bladder is grossly unremarkable. Stomach/Bowel: There is moderate stool throughout the  colon. There is segmental thickening of the sigmoid colon which may represent colitis or related to underlying infiltrative process. There is no bowel obstruction. The appendix is unremarkable. Vascular/Lymphatic: Mild aortoiliac atherosclerotic disease. The IVC is unremarkable. No portal venous gas. No definite adenopathy. Reproductive: The prostate and seminal vesicles are grossly unremarkable. Other: Diffuse subcutaneous edema and anasarca, new since the prior CT. Musculoskeletal: Degenerative changes of the spine. No acute osseous pathology. IMPRESSION: 1. Segmental thickening of the sigmoid colon may represent colitis or related to underlying infiltrative process. No bowel obstruction. Normal appendix. 2. Moderate ascites, increased since the prior CT. 3. Multiple hepatic metastatic disease, relatively similar to prior CT. 4. Diffuse subcutaneous edema and anasarca, new since the prior CT. 5. There is a 5 mm nodule in the right middle lobe. 6. Aortic Atherosclerosis (ICD10-I70.0). Electronically Signed   By: Anner Crete M.D.   On: 06/01/2021 01:24   US Venous Img Lower Bilateral (DVT)  Result Date: 06/10/2021 CLINICAL DATA:  Bilateral lower extremity edema. EXAM: BILATERAL LOWER EXTREMITY VENOUS DOPPLER ULTRASOUND TECHNIQUE: Gray-scale sonography with graded compression, as well as color Doppler and duplex ultrasound were performed to evaluate the lower extremity deep venous systems from the level of the common femoral vein and including the common femoral, femoral, profunda femoral, popliteal and calf veins including the posterior tibial, peroneal and gastrocnemius veins when visible. The superficial great saphenous vein was also interrogated. Spectral Doppler was utilized to evaluate flow at rest and with distal augmentation maneuvers in the common femoral, femoral and popliteal veins. COMPARISON:  None. FINDINGS: RIGHT LOWER EXTREMITY Common Femoral Vein: No evidence of thrombus. Normal  compressibility, respiratory phasicity and response to augmentation. Saphenofemoral Junction: No evidence of thrombus. Normal compressibility and flow on color Doppler imaging. Profunda Femoral Vein: No evidence of  thrombus. Normal compressibility and flow on color Doppler imaging. Femoral Vein: No evidence of thrombus. Normal compressibility, respiratory phasicity and response to augmentation. Popliteal Vein: No evidence of thrombus. Normal compressibility, respiratory phasicity and response to augmentation. Calf Veins: No evidence of thrombus. Normal compressibility and flow on color Doppler imaging. Other Findings:  Subcutaneous edema. LEFT LOWER EXTREMITY Common Femoral Vein: No evidence of thrombus. Normal compressibility, respiratory phasicity and response to augmentation. Saphenofemoral Junction: No evidence of thrombus. Normal compressibility and flow on color Doppler imaging. Profunda Femoral Vein: No evidence of thrombus. Normal compressibility and flow on color Doppler imaging. Femoral Vein: No evidence of thrombus. Normal compressibility, respiratory phasicity and response to augmentation. Popliteal Vein: No evidence of thrombus. Normal compressibility, respiratory phasicity and response to augmentation. Calf Veins: No evidence of thrombus. Normal compressibility and flow on color Doppler imaging. Other Findings:  Subcutaneous edema. IMPRESSION: No evidence of deep venous thrombosis in either lower extremity. Electronically Signed   By: Misty Stanley M.D.   On: 06/10/2021 10:43   DG Chest Portable 1 View  Result Date: 06/10/2021 CLINICAL DATA:  Leg swelling. EXAM: PORTABLE CHEST 1 VIEW COMPARISON:  None. FINDINGS: 0913 hours. Low lung volumes. Basilar atelectasis with possible tiny effusions. Right Port-A-Cath noted. Telemetry leads overlie the chest. IMPRESSION: Low volume film without edema or focal consolidation. Basilar atelectasis and possible tiny effusions. Electronically Signed   By: Misty Stanley M.D.   On: 06/10/2021 09:32     ASSESSMENT:  ***   PLAN:  ***   Orders placed this encounter:  No orders of the defined types were placed in this encounter.    Derek Jack, MD [facility] [phone]   I, ***, am acting as a scribe for Dr. Derek Jack.  {Add Barista Statement}

## 2021-06-13 ENCOUNTER — Ambulatory Visit (HOSPITAL_COMMUNITY): Payer: Medicaid Other | Admitting: Hematology

## 2021-06-13 ENCOUNTER — Ambulatory Visit (HOSPITAL_COMMUNITY): Payer: Medicaid Other

## 2021-06-13 ENCOUNTER — Inpatient Hospital Stay (HOSPITAL_COMMUNITY): Payer: Medicaid Other

## 2021-06-14 ENCOUNTER — Inpatient Hospital Stay (HOSPITAL_COMMUNITY): Payer: Medicaid Other | Attending: Hematology

## 2021-06-14 ENCOUNTER — Inpatient Hospital Stay (HOSPITAL_BASED_OUTPATIENT_CLINIC_OR_DEPARTMENT_OTHER): Payer: Medicaid Other | Admitting: Hematology

## 2021-06-14 ENCOUNTER — Other Ambulatory Visit (HOSPITAL_COMMUNITY): Payer: Self-pay | Admitting: Hematology

## 2021-06-14 ENCOUNTER — Other Ambulatory Visit: Payer: Self-pay

## 2021-06-14 ENCOUNTER — Inpatient Hospital Stay (HOSPITAL_COMMUNITY): Payer: Medicaid Other

## 2021-06-14 VITALS — BP 109/75 | HR 105 | Temp 96.0°F | Resp 18 | Ht 67.32 in | Wt 197.3 lb

## 2021-06-14 DIAGNOSIS — Z95828 Presence of other vascular implants and grafts: Secondary | ICD-10-CM

## 2021-06-14 DIAGNOSIS — I959 Hypotension, unspecified: Secondary | ICD-10-CM | POA: Diagnosis not present

## 2021-06-14 DIAGNOSIS — C187 Malignant neoplasm of sigmoid colon: Secondary | ICD-10-CM | POA: Diagnosis present

## 2021-06-14 DIAGNOSIS — C7800 Secondary malignant neoplasm of unspecified lung: Secondary | ICD-10-CM | POA: Diagnosis not present

## 2021-06-14 DIAGNOSIS — E875 Hyperkalemia: Secondary | ICD-10-CM | POA: Diagnosis not present

## 2021-06-14 DIAGNOSIS — C189 Malignant neoplasm of colon, unspecified: Secondary | ICD-10-CM

## 2021-06-14 DIAGNOSIS — C787 Secondary malignant neoplasm of liver and intrahepatic bile duct: Secondary | ICD-10-CM | POA: Diagnosis not present

## 2021-06-14 LAB — CBC WITH DIFFERENTIAL/PLATELET
Abs Immature Granulocytes: 0.04 10*3/uL (ref 0.00–0.07)
Basophils Absolute: 0.2 10*3/uL — ABNORMAL HIGH (ref 0.0–0.1)
Basophils Relative: 2 %
Eosinophils Absolute: 0.5 10*3/uL (ref 0.0–0.5)
Eosinophils Relative: 6 %
HCT: 31.1 % — ABNORMAL LOW (ref 39.0–52.0)
Hemoglobin: 10.3 g/dL — ABNORMAL LOW (ref 13.0–17.0)
Immature Granulocytes: 0 %
Lymphocytes Relative: 27 %
Lymphs Abs: 2.4 10*3/uL (ref 0.7–4.0)
MCH: 32.5 pg (ref 26.0–34.0)
MCHC: 33.1 g/dL (ref 30.0–36.0)
MCV: 98.1 fL (ref 80.0–100.0)
Monocytes Absolute: 0.9 10*3/uL (ref 0.1–1.0)
Monocytes Relative: 10 %
Neutro Abs: 5.1 10*3/uL (ref 1.7–7.7)
Neutrophils Relative %: 55 %
Platelets: 276 10*3/uL (ref 150–400)
RBC: 3.17 MIL/uL — ABNORMAL LOW (ref 4.22–5.81)
RDW: 17.2 % — ABNORMAL HIGH (ref 11.5–15.5)
WBC: 9.2 10*3/uL (ref 4.0–10.5)
nRBC: 0 % (ref 0.0–0.2)

## 2021-06-14 LAB — COMPREHENSIVE METABOLIC PANEL
ALT: 12 U/L (ref 0–44)
AST: 31 U/L (ref 15–41)
Albumin: 2.1 g/dL — ABNORMAL LOW (ref 3.5–5.0)
Alkaline Phosphatase: 255 U/L — ABNORMAL HIGH (ref 38–126)
Anion gap: 8 (ref 5–15)
BUN: 9 mg/dL (ref 6–20)
CO2: 23 mmol/L (ref 22–32)
Calcium: 7.9 mg/dL — ABNORMAL LOW (ref 8.9–10.3)
Chloride: 104 mmol/L (ref 98–111)
Creatinine, Ser: 0.75 mg/dL (ref 0.61–1.24)
GFR, Estimated: >60 mL/min (ref >60)
Glucose, Bld: 107 mg/dL — ABNORMAL HIGH (ref 70–99)
Potassium: 3.8 mmol/L (ref 3.5–5.1)
Sodium: 135 mmol/L (ref 135–145)
Total Bilirubin: 0.6 mg/dL (ref 0.3–1.2)
Total Protein: 4.7 g/dL — ABNORMAL LOW (ref 6.5–8.1)

## 2021-06-14 LAB — MAGNESIUM: Magnesium: 1.7 mg/dL (ref 1.7–2.4)

## 2021-06-14 MED ORDER — SODIUM CHLORIDE 0.9% FLUSH
10.0000 mL | Freq: Once | INTRAVENOUS | Status: AC
  Administered 2021-06-14: 10 mL via INTRAVENOUS

## 2021-06-14 MED ORDER — HEPARIN SOD (PORK) LOCK FLUSH 100 UNIT/ML IV SOLN
500.0000 [IU] | Freq: Once | INTRAVENOUS | Status: AC
  Administered 2021-06-14: 500 [IU] via INTRAVENOUS

## 2021-06-14 NOTE — Patient Instructions (Signed)
[facility]  Discharge Instructions: Thank you for choosing [facility] to provide your oncology and hematology care.  If you have a lab appointment with the Cancer Center, please come in thru the Main Entrance and check in at the main information desk.  Wear comfortable clothing and clothing appropriate for easy access to any Portacath or PICC line.   We strive to give you quality time with your provider. You may need to reschedule your appointment if you arrive late (15 or more minutes).  Arriving late affects you and other patients whose appointments are after yours.  Also, if you miss three or more appointments without notifying the office, you may be dismissed from the clinic at the provider's discretion.      For prescription refill requests, have your pharmacy contact our office and allow 72 hours for refills to be completed.    Today your port was flushed and labs were drawn, no treatment today per Dr. Katragadda. Return as scheduled.     To help prevent nausea and vomiting after your treatment, we encourage you to take your nausea medication as directed.  BELOW ARE SYMPTOMS THAT SHOULD BE REPORTED IMMEDIATELY: *FEVER GREATER THAN 100.4 F (38 C) OR HIGHER *CHILLS OR SWEATING *NAUSEA AND VOMITING THAT IS NOT CONTROLLED WITH YOUR NAUSEA MEDICATION *UNUSUAL SHORTNESS OF BREATH *UNUSUAL BRUISING OR BLEEDING *URINARY PROBLEMS (pain or burning when urinating, or frequent urination) *BOWEL PROBLEMS (unusual diarrhea, constipation, pain near the anus) TENDERNESS IN MOUTH AND THROAT WITH OR WITHOUT PRESENCE OF ULCERS (sore throat, sores in mouth, or a toothache) UNUSUAL RASH, SWELLING OR PAIN  UNUSUAL VAGINAL DISCHARGE OR ITCHING   Items with * indicate a potential emergency and should be followed up as soon as possible or go to the Emergency Department if any problems should occur.  Please show the CHEMOTHERAPY ALERT CARD or IMMUNOTHERAPY ALERT CARD at check-in  to the Emergency Department and triage nurse.  Should you have questions after your visit or need to cancel or reschedule your appointment, please contact [facility] [phone]  and follow the prompts.  Office hours are 8:00 a.m. to 4:30 p.m. Monday - Friday. Please note that voicemails left after 4:00 p.m. may not be returned until the following business day.  We are closed weekends and major holidays. You have access to a nurse at all times for urgent questions. Please call the main number to the clinic [phone] and follow the prompts.  For any non-urgent questions, you may also contact your provider using MyChart. We now offer e-Visits for anyone 18 and older to request care online for non-urgent symptoms. For details visit [email or web address].   Also download the MyChart app! Go to the app store, search "MyChart", open the app, select [facility], and log in with your MyChart username and password.  Due to Covid, a mask is required upon entering the hospital/clinic. If you do not have a mask, one will be given to you upon arrival. For doctor visits, patients may have 1 support person aged 18 or older with them. For treatment visits, patients cannot have anyone with them due to current Covid guidelines and our immunocompromised population.  

## 2021-06-14 NOTE — Progress Notes (Signed)
[place], [place] [address]   CLINIC:  Medical Oncology/Hematology  PCP:  [facility] / [place] [address] [phone]   REASON FOR VISIT:  Follow-up for colon cancer metastasized to liver  PRIOR THERAPY: none  NGS Results: not done  CURRENT THERAPY: FOLFOX every 2 weeks  BRIEF ONCOLOGIC HISTORY:  Oncology History Overview Note  MMR normal Foundation One: low mutation burden   Metastasis to liver ([place])  01/04/2021 Initial Diagnosis   Metastasis to liver ([place])   01/25/2021 -  Chemotherapy   Patient is on Treatment Plan : COLORECTAL FOLFOX q14d     Colon cancer metastasized to liver ([place])  12/26/2020 Imaging   Ct abdomen and pelvis 1. Extensive hepatic metastatic disease and retroperitoneal adenopathy. 2. A 7 mm right lung base nodule, most consistent with metastatic disease. 3. No bowel obstruction. Normal appendix. 4. Aortic Atherosclerosis (ICD10-I70.0).   12/26/2020 Imaging   CT chest 1. Several small lung nodules consistent with metastatic disease. The primary malignancy remains unclear. The liver morphology suggests underlying cirrhosis, and multifocal hepatocellular carcinoma should be included in the differential diagnosis. 2. No acute abnormality in the chest.     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 LIVER, NEEDLE CORE BIOPSY:  -  Metastatic adenocarcinoma  -  See comment   COMMENT:   The neoplastic cells are positive for cytokeratin 20 and CDX2 but negative for cytokeratin 7, TTF-1, PSA and prostein.  The combined morphology and immunophenotype are consistent with metastasis from a  gastrointestinal primary.  Dr. Saralyn Pilar reviewed the case and agrees with the above diagnosis.     01/13/2021 Procedure   Colonoscopy - Two 3 to 5 mm polyps in the transverse colon, removed with a cold snare. Resected and  retrieved. - Malignant partially obstructing tumor at 17 cm proximal to the anus. Biopsied. Tattooed. - The distal rectum and anal verge are normal on retroflexion view.   01/13/2021 Cancer Staging   Staging form: Colon and Rectum, AJCC 8th Edition - Clinical stage from 01/13/2021: Stage IVB (cTX, cN0, pM1b) - Signed by Heath Lark, MD on 01/13/2021 Stage prefix: Initial diagnosis Total positive nodes: 0    01/13/2021 Procedure   Colonoscopy - Two 3 to 5 mm polyps in the transverse colon, removed with a cold snare. Resected and retrieved. - Malignant partially obstructing tumor at 17 cm proximal to the anus. Biopsied. Tattooed. - The distal rectum and anal verge are normal on retroflexion view.   01/13/2021 Procedure   EGD - Normal esophagus. - Z-line regular, 40 cm from the incisors. - Normal stomach. - Nodular mucosa in   01/13/2021 Pathology Results   A. DUODENUM, NODULE, BIOPSY:  - Peptic duodenitis.  - No dysplasia or malignancy.   B. COLON, SIGMOID, MASS, BIOPSY:  - Invasive adenocarcinoma.   C. COLON, TRANSVERSE, POLYPECTOMY:  - Fragment of ulcerated tissue with adenocarcinoma, see comment.  - Tubular adenoma (X2 fragments).   COMMENT:   B. MMR will be ordered   01/13/2021 Tumor Marker   Patient's tumor was tested for the following markers: CEA. Results of the tumor marker test revealed 883.   01/24/2021 Procedure   Successful right-sided port placement, with the tip of the catheter in the proximal right atrium.   Plan: Catheter ready for use.   See detailed procedure note with images in PACS. The patient tolerated  the procedure well without incident or complication and was returned to Recovery in stable conditi   01/25/2021 -  Chemotherapy   Patient is on Treatment Plan : COLORECTAL FOLFOX q14d     01/25/2021 Tumor Marker   Patient's tumor was tested for the following markers: CEA. Results of the tumor marker test revealed 1378.     CANCER STAGING:  Cancer Staging   Colon cancer metastasized to liver [facility]) Staging form: Colon and Rectum, AJCC 8th Edition - Clinical stage from 01/13/2021: Stage IVB (cTX, cN0, pM1b) - Signed by Heath Lark, MD on 01/13/2021   INTERVAL HISTORY:  Henry Johns, a 60 y.o. male, returns for routine follow-up and consideration for next cycle of chemotherapy. Henry Johns was last seen on 05/22/2021.  Due for cycle #10 of FOLFOX today.   Overall, he tells me he has been feeling pretty well. He reports swelling in his feet bilaterally as well as abdominal distention. He has gained 28 pounds since 11/24. He reports his cold sensitivity has resolved. He also reports loose diarrhea 3-4 times daily. His appetite is good. He reports a large blister on his right foot which has drained. He reports he was on amoxicillin for 11 days and completed the course yesterday.   Overall, he feels ready for next cycle of chemo today.   REVIEW OF SYSTEMS:  Review of Systems  Constitutional:  Positive for unexpected weight change (+28 lbs). Negative for appetite change (60%) and fatigue (50%).  Cardiovascular:  Positive for leg swelling (feet).  Gastrointestinal:  Positive for constipation, diarrhea, nausea and vomiting.  Neurological:  Positive for numbness.  All other systems reviewed and are negative.  PAST MEDICAL/SURGICAL HISTORY:  Past Medical History:  Diagnosis Date   Colon cancer ([place])    Diabetes mellitus without complication ([place])    Hypertension    Past Surgical History:  Procedure Laterality Date   BIOPSY  01/13/2021   Procedure: BIOPSY;  Surgeon: Harvel Quale, MD;  [facility];  Service: Gastroenterology;;   CATARACT EXTRACTION Right    COLONOSCOPY WITH PROPOFOL N/A 01/13/2021   Procedure: COLONOSCOPY WITH PROPOFOL;  Surgeon: Harvel Quale, MD;  Location: [facility];  Service: Gastroenterology;  Laterality: N/A;  11:05   ESOPHAGOGASTRODUODENOSCOPY (EGD) WITH PROPOFOL N/A 01/13/2021    Procedure: ESOPHAGOGASTRODUODENOSCOPY (EGD) WITH PROPOFOL;  Surgeon: Harvel Quale, MD;  Location: [facility];  Service: Gastroenterology;  Laterality: N/A;   IR IMAGING GUIDED PORT INSERTION  01/24/2021   LIVER BIOPSY  01/10/2021   U/S guided   POLYPECTOMY  01/13/2021   Procedure: POLYPECTOMY;  Surgeon: Harvel Quale, MD;  [facility];  Service: Gastroenterology;;    SOCIAL HISTORY:  Social History   Socioeconomic History   Marital status: Married    Spouse name: Not on file   Number of children: Not on file   Years of education: Not on file   Highest education level: Not on file  Occupational History   Not on file  Tobacco Use   Smoking status: Never   Smokeless tobacco: Never  Vaping Use   Vaping Use: Never used  Substance and Sexual Activity   Alcohol use: Not Currently   Drug use: Not Currently   Sexual activity: Not Currently  Other Topics Concern   Not on file  Social History Narrative   Married, no childer   Social Determinants of Health   Financial Resource Strain: Not on file  Food Insecurity: Not on file  Transportation Needs: No Data processing manager (Medical): No   Lack of Transportation (Non-Medical): No  Physical Activity: Inactive   Days of Exercise per Week: 0 days   Minutes of Exercise per Session: 0 min  Stress: Not on file  Social Connections: Not on file  Intimate Partner Violence: Not At Risk   Fear of Current or Ex-Partner: No   Emotionally Abused: No   Physically Abused: No   Sexually Abused: No    FAMILY HISTORY:  Family History  Problem Relation Age of Onset   Cancer Father 64       prostate ca    CURRENT MEDICATIONS:  Current Outpatient Medications  Medication Sig Dispense Refill   furosemide (LASIX) 20 MG tablet Take 20 mg by mouth daily.     insulin NPH-regular Human (70-30) 100 UNIT/ML injection Inject 10-12 Units into the skin 2 (two) times daily.     LEUCOVORIN  CALCIUM IV Inject 400 mg/m2 into the vein every 14 (fourteen) days.     lidocaine (XYLOCAINE) 5 % ointment Apply 1 application topically in the morning and at bedtime. Apply to both hands twice a day 35.44 g 0   lidocaine-prilocaine (EMLA) cream Apply to affected area once 30 g 3   LORazepam (ATIVAN) 0.5 MG tablet Take 1 tablet by mouth twice daily as needed for anxiety 60 tablet 3   magnesium oxide (MAG-OX) 400 (240 Mg) MG tablet Take 1 tablet (400 mg total) by mouth 2 (two) times daily. 60 tablet 3   midodrine (PROAMATINE) 10 MG tablet Take 1 tablet (10 mg total) by mouth 3 (three) times daily with meals. 90 tablet 0   OXALIPLATIN IV Inject 85 mg/m2 into the vein every 14 (fourteen) days.     pentoxifylline (TRENTAL) 400 MG CR tablet Take by mouth.     Potassium Chloride ER (K-TAB) 20 MEQ TBCR Take 20 mEq by mouth daily. 30 tablet 3   prochlorperazine (COMPAZINE) 10 MG tablet Take 1 tablet by mouth every 6 (six) hours as needed.     silver sulfADIAZINE (SILVADENE) 1 % cream Apply 1 application topically daily. 50 g 0   simvastatin (ZOCOR) 20 MG tablet Take 20 mg by mouth every evening.     acetaminophen (TYLENOL) 500 MG tablet Take 1,000 mg by mouth every 6 (six) hours as needed for moderate pain. (Patient not taking: Reported on 06/14/2021)     fluorouracil CALGB 29528 2,400 mg/m2 in sodium chloride 0.9 % 150 mL Inject 2,400 mg/m2 into the vein over 48 hr.     Lidocaine, Anorectal, 5 % CREA Apply 1 application topically 2 (two) times daily as needed. (Patient not taking: Reported on 06/14/2021) 30 g 3   morphine (MSIR) 15 MG tablet Take 1 tablet (15 mg total) by mouth 2 (two) times daily as needed for severe pain. (Patient not taking: Reported on 06/14/2021) 60 tablet 0   ondansetron (ZOFRAN) 8 MG tablet Take 1 tablet (8 mg total) by mouth every 8 (eight) hours as needed for nausea. (Patient not taking: Reported on 06/14/2021) 60 tablet 3   temazepam (RESTORIL) 15 MG capsule TAKE 1 CAPSULE BY MOUTH  AT BEDTIME AS NEEDED FOR SLEEP (Patient not taking: Reported on 06/14/2021) 30 capsule 0   No current facility-administered medications for this visit.    ALLERGIES:  No Known Allergies  PHYSICAL EXAM:  Performance status (ECOG): 1 - Symptomatic but completely ambulatory  Vitals:   06/14/21 0808  BP: 109/75  Pulse: Marland Kitchen)  105  Resp: 18  Temp: (!) 96 F (35.6 C)  SpO2: 95%   Wt Readings from Last 3 Encounters:  06/14/21 197 lb 5 oz (89.5 kg)  06/10/21 169 lb 15.6 oz (77.1 kg)  06/01/21 169 lb 15.6 oz (77.1 kg)   Physical Exam Vitals reviewed.  Constitutional:      Appearance: Normal appearance.     Comments: In wheelchair  Cardiovascular:     Rate and Rhythm: Normal rate and regular rhythm.     Pulses: Normal pulses.     Heart sounds: Normal heart sounds.  Pulmonary:     Effort: Pulmonary effort is normal.     Breath sounds: Normal breath sounds.  Abdominal:     Palpations: There is no mass.     Tenderness: There is no abdominal tenderness.  Musculoskeletal:     Right lower leg: 3+ Edema present.     Left lower leg: 3+ Edema present.  Neurological:     General: No focal deficit present.     Mental Status: He is alert and oriented to person, place, and time.  Psychiatric:        Mood and Affect: Mood normal.        Behavior: Behavior normal.    LABORATORY DATA:  I have reviewed the labs as listed.  CBC Latest Ref Rng & Units 06/14/2021 06/10/2021 06/05/2021  WBC 4.0 - 10.5 K/uL 9.2 5.0 4.9  Hemoglobin 13.0 - 17.0 g/dL 10.3(L) 9.3(L) 8.6(L)  Hematocrit 39.0 - 52.0 % 31.1(L) 28.2(L) 26.6(L)  Platelets 150 - 400 K/uL 276 224 109(L)   CMP Latest Ref Rng & Units 06/14/2021 06/10/2021 06/05/2021  Glucose 70 - 99 mg/dL 107(H) 95 108(H)  BUN 6 - 20 mg/dL _0 Creatinine 0.61 - 1.24 mg/dL 0.75 0.56(L) 0.54(L)  Sodium 135 - 145 mmol/L 135 136 133(L)  Potassium 3.5 - 5.1 mmol/L 3.8 3.8 3.4(L)  Chloride 98 - 111 mmol/L 104 106 108  CO2 22 - 32 mmol/L 23 25 21(L)   Calcium 8.9 - 10.3 mg/dL 7.9(L) 7.7(L) 7.3(L)  Total Protein 6.5 - 8.1 g/dL 4.7(L) 4.6(L) -  Total Bilirubin 0.3 - 1.2 mg/dL 0.6 0.2(L) -  Alkaline Phos 38 - 126 U/L 255(H) 273(H) -  AST 15 - 41 U/L 31 29 -  ALT 0 - 44 U/L 12 12 -    DIAGNOSTIC IMAGING:  I have independently reviewed the scans and discussed with the patient. CT ABDOMEN PELVIS W CONTRAST  Result Date: 06/01/2021 CLINICAL DATA:  Abdominal pain.  Metastatic colon cancer. EXAM: CT ABDOMEN AND PELVIS WITH CONTRAST TECHNIQUE: Multidetector CT imaging of the abdomen and pelvis was performed using the standard protocol following bolus administration of intravenous contrast. CONTRAST:  154m OMNIPAQUE IOHEXOL 300 MG/ML  SOLN COMPARISON:  CT of the chest abdomen pelvis dated 04/19/2021. FINDINGS: Lower chest: There is a 5 mm nodule in the right middle lobe. Right lung base atelectasis. The visualized left lung is clear. Partially visualized central venous line with tip at the cavoatrial junction. No intra-abdominal free air. Moderate ascites, increased since the prior CT. Hepatobiliary: Multiple hepatic metastatic disease, relatively similar to prior CT. There is irregularity of the liver contour which may represent cirrhosis or pseudo cirrhosis. No intrahepatic biliary dilatation. The gallbladder is unremarkable. Pancreas: Unremarkable. No pancreatic ductal dilatation or surrounding inflammatory changes. Spleen: Normal in size without focal abnormality. Adrenals/Urinary Tract: The adrenal glands unremarkable. There is a 1 cm right renal cyst. There is no hydronephrosis on  either side. There is symmetric enhancement and excretion of contrast by both kidneys. The urinary bladder is grossly unremarkable. Stomach/Bowel: There is moderate stool throughout the colon. There is segmental thickening of the sigmoid colon which may represent colitis or related to underlying infiltrative process. There is no bowel obstruction. The appendix is  unremarkable. Vascular/Lymphatic: Mild aortoiliac atherosclerotic disease. The IVC is unremarkable. No portal venous gas. No definite adenopathy. Reproductive: The prostate and seminal vesicles are grossly unremarkable. Other: Diffuse subcutaneous edema and anasarca, new since the prior CT. Musculoskeletal: Degenerative changes of the spine. No acute osseous pathology. IMPRESSION: 1. Segmental thickening of the sigmoid colon may represent colitis or related to underlying infiltrative process. No bowel obstruction. Normal appendix. 2. Moderate ascites, increased since the prior CT. 3. Multiple hepatic metastatic disease, relatively similar to prior CT. 4. Diffuse subcutaneous edema and anasarca, new since the prior CT. 5. There is a 5 mm nodule in the right middle lobe. 6. Aortic Atherosclerosis (ICD10-I70.0). Electronically Signed   By: Anner Crete M.D.   On: 06/01/2021 01:24   US Venous Img Lower Bilateral (DVT)  Result Date: 06/10/2021 CLINICAL DATA:  Bilateral lower extremity edema. EXAM: BILATERAL LOWER EXTREMITY VENOUS DOPPLER ULTRASOUND TECHNIQUE: Gray-scale sonography with graded compression, as well as color Doppler and duplex ultrasound were performed to evaluate the lower extremity deep venous systems from the level of the common femoral vein and including the common femoral, femoral, profunda femoral, popliteal and calf veins including the posterior tibial, peroneal and gastrocnemius veins when visible. The superficial great saphenous vein was also interrogated. Spectral Doppler was utilized to evaluate flow at rest and with distal augmentation maneuvers in the common femoral, femoral and popliteal veins. COMPARISON:  None. FINDINGS: RIGHT LOWER EXTREMITY Common Femoral Vein: No evidence of thrombus. Normal compressibility, respiratory phasicity and response to augmentation. Saphenofemoral Junction: No evidence of thrombus. Normal compressibility and flow on color Doppler imaging. Profunda  Femoral Vein: No evidence of thrombus. Normal compressibility and flow on color Doppler imaging. Femoral Vein: No evidence of thrombus. Normal compressibility, respiratory phasicity and response to augmentation. Popliteal Vein: No evidence of thrombus. Normal compressibility, respiratory phasicity and response to augmentation. Calf Veins: No evidence of thrombus. Normal compressibility and flow on color Doppler imaging. Other Findings:  Subcutaneous edema. LEFT LOWER EXTREMITY Common Femoral Vein: No evidence of thrombus. Normal compressibility, respiratory phasicity and response to augmentation. Saphenofemoral Junction: No evidence of thrombus. Normal compressibility and flow on color Doppler imaging. Profunda Femoral Vein: No evidence of thrombus. Normal compressibility and flow on color Doppler imaging. Femoral Vein: No evidence of thrombus. Normal compressibility, respiratory phasicity and response to augmentation. Popliteal Vein: No evidence of thrombus. Normal compressibility, respiratory phasicity and response to augmentation. Calf Veins: No evidence of thrombus. Normal compressibility and flow on color Doppler imaging. Other Findings:  Subcutaneous edema. IMPRESSION: No evidence of deep venous thrombosis in either lower extremity. Electronically Signed   By: Misty Stanley M.D.   On: 06/10/2021 10:43   DG Chest Portable 1 View  Result Date: 06/10/2021 CLINICAL DATA:  Leg swelling. EXAM: PORTABLE CHEST 1 VIEW COMPARISON:  None. FINDINGS: 0913 hours. Low lung volumes. Basilar atelectasis with possible tiny effusions. Right Port-A-Cath noted. Telemetry leads overlie the chest. IMPRESSION: Low volume film without edema or focal consolidation. Basilar atelectasis and possible tiny effusions. Electronically Signed   By: Misty Stanley M.D.   On: 06/10/2021 09:32     ASSESSMENT:  1.  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 - CT  AP with contrast on 12/26/2020 showed extensive hepatic metastatic disease and retroperitoneal adenopathy.  CT chest without contrast on 12/26/2020 showed several small subcentimeter lung nodules consistent with metastatic disease.  Liver morphology suggests underlying cirrhosis. - Liver biopsy on 01/10/2021 consistent with metastatic adenocarcinoma, CK20 and CDX2 positive, negative for CK7, TTF-1, PSA and prostein. - Colonoscopy and biopsy of the sigmoid mass on 01/13/2021 consistent with adenocarcinoma. - 10 pound weight loss prior to start of therapy. - FOLFOX started on 01/25/2021. - Foundation 1 testing on 01/10/2021 showed MSI-stable, TMB low, negative for RAS/BRAF/HER2 mutation.  2.  Social/family history: - Lives at home with his wife.  He drove truck and operated equipment.  No smoking history. - Father had prostate cancer   PLAN:  1.  Metastatic sigmoid colon cancer to liver and lungs: -He has completed antibiotics yesterday from recent hospitalization. - He has 3+ edema in the lower extremities and some abdominal distention. - He is also on midodrine 10 mg 3 times daily to maintain his blood pressure. - Will increase his Lasix to 40 mg daily as swelling is poorly controlled with 20 mg daily dose. - We will hold his treatment today.  RTC 2 weeks for follow-up.   2.  Coccygeal pain: -Continue half tablet of MS IR at bedtime as needed.  3.  Hypokalemia: -Continue potassium 20 mEq daily.  4.  Difficulty falling/staying asleep: -Continue Restoril 15 mg at bedtime.  5.  Constipation: -Continue lactulose and stool softener as needed.  6.  Hypomagnesemia: -Continue magnesium 400 mg twice daily.  7.  EGFR antibody induced rash: -This has resolved as he is off of Vectibix at this time.  8.  Anxiety: -Continue Ativan 0.5 mg daily as needed.   Orders placed this encounter:  No orders of the defined types were placed in this encounter.    Derek Jack, MD [place] [phone]   I, Thana Ates, am acting as a scribe for Dr. Derek Jack.  I, Derek Jack MD, have reviewed the above documentation for accuracy and completeness, and I agree with the above.

## 2021-06-14 NOTE — Progress Notes (Signed)
No treatment today per Dr. Delton Coombes. Port flushed with good blood return noted. No bruising or swelling at site. Bandaid applied and patient discharged in satisfactory condition. Patient declined AVS. VVS stable with no signs or symptoms of distressed noted.

## 2021-06-14 NOTE — Patient Instructions (Signed)
[place] at [facility] Discharge Instructions   You were seen and examined today by Dr. Delton Coombes. We will hold treatment for the next 2 weeks. Increase Lasix (furosemide) to 40 mg (2 pills) in the morning.  Return as scheduled in 2 weeks for lab work and possible treatment.    Thank you for choosing [place] at [facility] to provide your oncology and hematology care.  To afford each patient quality time with our provider, please arrive at least 15 minutes before your scheduled appointment time.   If you have a lab appointment with the [facility] please come in thru the Main Entrance and check in at the main information desk.  You need to re-schedule your appointment should you arrive 10 or more minutes late.  We strive to give you quality time with our providers, and arriving late affects you and other patients whose appointments are after yours.  Also, if you no show three or more times for appointments you may be dismissed from the clinic at the providers discretion.     Again, thank you for choosing [facility].  Our hope is that these requests will decrease the amount of time that you wait before being seen by our physicians.       _____________________________________________________________  Should you have questions after your visit to [facility], please contact our office at [phone] and follow the prompts.  Our office hours are 8:00 a.m. and 4:30 p.m. Monday - Friday.  Please note that voicemails left after 4:00 p.m. may not be returned until the following business day.  We are closed weekends and major holidays.  You do have access to a nurse 24-7, just call the main number to the clinic [phone] and do not press any options, hold on the line and a nurse will answer the phone.    For prescription refill requests, have your pharmacy contact our office and allow 72 hours.    Due to Covid, you  will need to wear a mask upon entering the hospital. If you do not have a mask, a mask will be given to you at the Main Entrance upon arrival. For doctor visits, patients may have 1 support person age 61 or older with them. For treatment visits, patients can not have anyone with them due to social distancing guidelines and our immunocompromised population.

## 2021-06-15 ENCOUNTER — Encounter (HOSPITAL_COMMUNITY): Payer: Self-pay | Admitting: Hematology

## 2021-06-15 ENCOUNTER — Other Ambulatory Visit (HOSPITAL_COMMUNITY): Payer: Self-pay | Admitting: *Deleted

## 2021-06-15 ENCOUNTER — Encounter (HOSPITAL_COMMUNITY): Payer: Medicaid Other

## 2021-06-15 LAB — CEA: CEA: 16.2 ng/mL — ABNORMAL HIGH (ref 0.0–4.7)

## 2021-06-15 MED ORDER — MORPHINE SULFATE 15 MG PO TABS: 15.0000 mg | ORAL_TABLET | Freq: Two times a day (BID) | ORAL | 0 refills | Status: DC | PRN

## 2021-06-16 ENCOUNTER — Encounter (HOSPITAL_COMMUNITY): Payer: Medicaid Other

## 2021-06-20 ENCOUNTER — Encounter (HOSPITAL_COMMUNITY): Payer: Self-pay | Admitting: General Practice

## 2021-06-20 NOTE — Progress Notes (Signed)
[facility] [place] CSW Progress Notes  Request from Elmore Guise, RN to assess for resources re medications.  Patient has stated he has difficulty affording prescriptions.  Called patient, he cannot remember what medications were not able to be acquired at [facility] at latest pickup.  States he is concerned about availability of pain medications, mentioned morphine as not being available.  Called [facility] and spoke w pharmacist.  Patients wife picked up all medications prescribed at [facility] copay - last pick up was 06/15/21.  Some prescriptions are not covered under his Medicaid - magnesium oxide is considered OTC and costs $20 for 90 day supply.  DexCom is a diabetes monitor prescribed by Vernona Rieger at [facility] and costs approx $295 - this device is not covered by Medicaid and would need to be paid for out of pocket.  Patient also reports that he does receive SSDI, which started approx 3 months ago.  Disability determination date was 02/2021.  He receives a limited amount of SSDI, which will increase somewhat in Jan 2023.  The amount he receives is still under what he would receive w SSI - he is not eligible for SSI and does not have enough work credits to receive full payment from [place] has reached out to [employer] who helped him file for disabilty to determine if there is any way he can receive more help from disability.  He will not be eligible for Medicare for 24 months after his disability determination date, thus August 2024.  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  He is receiving help from [facility] and can request financial assistance from them - encouraged him to apply.  Updated treatment team.  Edwyna Shell, LCSW Clinical Social Worker Phone:  [phone]

## 2021-06-21 ENCOUNTER — Other Ambulatory Visit (HOSPITAL_COMMUNITY): Payer: Self-pay | Admitting: *Deleted

## 2021-06-21 DIAGNOSIS — R609 Edema, unspecified: Secondary | ICD-10-CM

## 2021-06-21 MED ORDER — FUROSEMIDE 20 MG PO TABS: 20.0000 mg | ORAL_TABLET | Freq: Every day | ORAL | 1 refills | Status: DC | PRN

## 2021-06-27 NOTE — Progress Notes (Signed)
Henry Johns, [place] [address]   CLINIC:  Medical Oncology/Hematology  PCP:  [facility] [place] / [place] [address] [phone]   REASON FOR VISIT:  Follow-up for colon cancer metastasized to liver  PRIOR THERAPY: none  NGS Results: not done  CURRENT THERAPY: FOLFOX every 2 weeks  BRIEF ONCOLOGIC HISTORY:  Oncology History Overview Note  MMR normal Foundation One: low mutation burden   Metastasis to liver ([place])  01/04/2021 Initial Diagnosis   Metastasis to liver ([place])   01/25/2021 -  Chemotherapy   Patient is on Treatment Plan : COLORECTAL FOLFOX q14d     Colon cancer metastasized to liver ([place])  12/26/2020 Imaging   Ct abdomen and pelvis 1. Extensive hepatic metastatic disease and retroperitoneal adenopathy. 2. A 7 mm right lung base nodule, most consistent with metastatic disease. 3. No bowel obstruction. Normal appendix. 4. Aortic Atherosclerosis (ICD10-I70.0).   12/26/2020 Imaging   CT chest 1. Several small lung nodules consistent with metastatic disease. The primary malignancy remains unclear. The liver morphology suggests underlying cirrhosis, and multifocal hepatocellular carcinoma should be included in the differential diagnosis. 2. No acute abnormality in the chest.     01/04/2021 Initial Diagnosis   Carcinoma metastatic to lung [facility])   01/10/2021 Pathology Results   A. LIVER, NEEDLE CORE BIOPSY:  -  Metastatic adenocarcinoma  -  See comment   COMMENT:   The neoplastic cells are positive for cytokeratin 20 and CDX2 but negative for cytokeratin 7, TTF-1, PSA and prostein.  The combined morphology and immunophenotype are consistent with metastasis from a  gastrointestinal primary.  Dr. Saralyn Pilar reviewed the case and agrees with the above diagnosis.     01/13/2021 Procedure   Colonoscopy - Two 3 to 5 mm polyps in the transverse colon, removed with a cold snare. Resected and  retrieved. - Malignant partially obstructing tumor at 17 cm proximal to the anus. Biopsied. Tattooed. - The distal rectum and anal verge are normal on retroflexion view.   01/13/2021 Cancer Staging   Staging form: Colon and Rectum, AJCC 8th Edition - Clinical stage from 01/13/2021: Stage IVB (cTX, cN0, pM1b) - Signed by Heath Lark, MD on 01/13/2021 Stage prefix: Initial diagnosis Total positive nodes: 0    01/13/2021 Procedure   Colonoscopy - Two 3 to 5 mm polyps in the transverse colon, removed with a cold snare. Resected and retrieved. - Malignant partially obstructing tumor at 17 cm proximal to the anus. Biopsied. Tattooed. - The distal rectum and anal verge are normal on retroflexion view.   01/13/2021 Procedure   EGD - Normal esophagus. - Z-line regular, 40 cm from the incisors. - Normal stomach. - Nodular mucosa in   01/13/2021 Pathology Results   A. DUODENUM, NODULE, BIOPSY:  - Peptic duodenitis.  - No dysplasia or malignancy.   B. COLON, SIGMOID, MASS, BIOPSY:  - Invasive adenocarcinoma.   C. COLON, TRANSVERSE, POLYPECTOMY:  - Fragment of ulcerated tissue with adenocarcinoma, see comment.  - Tubular adenoma (X2 fragments).   COMMENT:   B. MMR will be ordered   01/13/2021 Tumor Marker   Patient's tumor was tested for the following markers: CEA. Results of the tumor marker test revealed 883.   01/24/2021 Procedure   Successful right-sided port placement, with the tip of the catheter in the proximal right atrium.   Plan: Catheter ready for use.   See detailed procedure note with images in PACS. The patient tolerated  the procedure well without incident or complication and was returned to Recovery in stable conditi   01/25/2021 -  Chemotherapy   Patient is on Treatment Plan : COLORECTAL FOLFOX q14d     01/25/2021 Tumor Marker   Patient's tumor was tested for the following markers: CEA. Results of the tumor marker test revealed 1378.     CANCER STAGING:  Cancer Staging   Colon cancer metastasized to liver [facility]) Staging form: Colon and Rectum, AJCC 8th Edition - Clinical stage from 01/13/2021: Stage IVB (cTX, cN0, pM1b) - Signed by Heath Lark, MD on 01/13/2021   INTERVAL HISTORY:  [name], a 60 y.o. male, returns for routine follow-up and consideration for next cycle of chemotherapy. Cloy was last seen on 05/22/2021.  Due for cycle #10 of FOLFOX today.   Overall, he tells me he has been feeling pretty well. He reports nausea which has been helped slightly by Zofran. He reports continued swelling of his legs and feet. He reports the blister on his right foot has burst, and the skin has peeled off. He reports abdominal bloating. He has lost 3 pounds over 2 weeks.  Overall, he feels ready for next cycle of chemo today.   REVIEW OF SYSTEMS:  Review of Systems  Constitutional:  Positive for appetite change (20%) and fatigue (10%).  Respiratory:  Positive for shortness of breath.   Cardiovascular:  Positive for leg swelling (feet).  Gastrointestinal:  Positive for abdominal distention (bloating), constipation, nausea and vomiting.  Musculoskeletal:  Positive for back pain (3/10 back).  Neurological:  Positive for numbness (fingers).  Psychiatric/Behavioral:  Positive for sleep disturbance.   All other systems reviewed and are negative.  PAST MEDICAL/SURGICAL HISTORY:  Past Medical History:  Diagnosis Date   Colon cancer ([place])    Diabetes mellitus without complication ([place])    Hypertension    Past Surgical History:  Procedure Laterality Date   BIOPSY  01/13/2021   Procedure: BIOPSY;  Surgeon: Harvel Quale, MD;  [facility];  Service: Gastroenterology;;   CATARACT EXTRACTION Right    COLONOSCOPY WITH PROPOFOL N/A 01/13/2021   Procedure: COLONOSCOPY WITH PROPOFOL;  Surgeon: Harvel Quale, MD;  Location: [facility];  Service: Gastroenterology;  Laterality: N/A;  11:05   ESOPHAGOGASTRODUODENOSCOPY (EGD)  WITH PROPOFOL N/A 01/13/2021   Procedure: ESOPHAGOGASTRODUODENOSCOPY (EGD) WITH PROPOFOL;  Surgeon: Harvel Quale, MD;  Location[facility];  Service: Gastroenterology;  Laterality: N/A;   IR IMAGING GUIDED PORT INSERTION  01/24/2021   LIVER BIOPSY  01/10/2021   U/S guided   POLYPECTOMY  01/13/2021   Procedure: POLYPECTOMY;  Surgeon: Harvel Quale, MD;  [facility];  Service: Gastroenterology;;    SOCIAL HISTORY:  Social History   Socioeconomic History   Marital status: Married    Spouse name: Not on file   Number of children: Not on file   Years of education: Not on file   Highest education level: Not on file  Occupational History   Not on file  Tobacco Use   Smoking status: Never   Smokeless tobacco: Never  Vaping Use   Vaping Use: Never used  Substance and Sexual Activity   Alcohol use: Not Currently   Drug use: Not Currently   Sexual activity: Not Currently  Other Topics Concern   Not on file  Social History Narrative   Married, no childer   Social Determinants of Health   Financial Resource Strain: High Risk   Difficulty  of Paying Living Expenses: Very hard  Food Insecurity: No Food Insecurity   Worried About Running Out of Food in the Last Year: Never true   Ran Out of Food in the Last Year: Never true  Transportation Needs: No Transportation Needs   Lack of Transportation (Medical): No   Lack of Transportation (Non-Medical): No  Physical Activity: Inactive   Days of Exercise per Week: 0 days   Minutes of Exercise per Session: 0 min  Stress: Not on file  Social Connections: Not on file  Intimate Partner Violence: Not At Risk   Fear of Current or Ex-Partner: No   Emotionally Abused: No   Physically Abused: No   Sexually Abused: No    FAMILY HISTORY:  Family History  Problem Relation Age of Onset   Cancer Father 60       prostate ca    CURRENT MEDICATIONS:  Current Outpatient Medications  Medication Sig Dispense  Refill   acetaminophen (TYLENOL) 500 MG tablet Take 1,000 mg by mouth every 6 (six) hours as needed for moderate pain.     fluorouracil CALGB 08676 2,400 mg/m2 in sodium chloride 0.9 % 150 mL Inject 2,400 mg/m2 into the vein over 48 hr.     furosemide (LASIX) 20 MG tablet Take 1 tablet (20 mg total) by mouth daily as needed. 30 tablet 1   insulin NPH-regular Human (70-30) 100 UNIT/ML injection Inject 10-12 Units into the skin 2 (two) times daily.     LEUCOVORIN CALCIUM IV Inject 400 mg/m2 into the vein every 14 (fourteen) days.     lidocaine (XYLOCAINE) 5 % ointment Apply 1 application topically in the morning and at bedtime. Apply to both hands twice a day 35.44 g 0   Lidocaine, Anorectal, 5 % CREA Apply 1 application topically 2 (two) times daily as needed. 30 g 3   LORazepam (ATIVAN) 0.5 MG tablet Take 1 tablet by mouth twice daily as needed for anxiety 60 tablet 3   magnesium oxide (MAG-OX) 400 (240 Mg) MG tablet Take 1 tablet (400 mg total) by mouth 2 (two) times daily. 60 tablet 3   midodrine (PROAMATINE) 10 MG tablet Take 1 tablet (10 mg total) by mouth 3 (three) times daily with meals. 90 tablet 0   morphine (MSIR) 15 MG tablet Take 1 tablet (15 mg total) by mouth 2 (two) times daily as needed for severe pain. 60 tablet 0   pentoxifylline (TRENTAL) 400 MG CR tablet Take by mouth.     Potassium Chloride ER (K-TAB) 20 MEQ TBCR Take 20 mEq by mouth daily. 30 tablet 3   silver sulfADIAZINE (SILVADENE) 1 % cream Apply 1 application topically daily. 50 g 0   simvastatin (ZOCOR) 20 MG tablet Take 20 mg by mouth every evening.     temazepam (RESTORIL) 15 MG capsule TAKE 1 CAPSULE BY MOUTH AT BEDTIME AS NEEDED FOR SLEEP 30 capsule 0   lidocaine-prilocaine (EMLA) cream Apply to affected area once (Patient not taking: Reported on 06/28/2021) 30 g 3   ondansetron (ZOFRAN) 8 MG tablet Take 1 tablet (8 mg total) by mouth every 8 (eight) hours as needed for nausea. (Patient not taking: Reported on  06/28/2021) 60 tablet 3   prochlorperazine (COMPAZINE) 10 MG tablet Take 1 tablet by mouth every 6 (six) hours as needed. (Patient not taking: Reported on 06/28/2021)     No current facility-administered medications for this visit.    ALLERGIES:  No Known Allergies  PHYSICAL EXAM:  Performance status (ECOG):  1 - Symptomatic but completely ambulatory  Vitals:   06/28/21 0837  BP: 98/76  Pulse: 99  Resp: 18  Temp: (!) 96.4 F (35.8 C)  SpO2: 97%   Wt Readings from Last 3 Encounters:  06/28/21 194 lb (88 kg)  06/14/21 197 lb 5 oz (89.5 kg)  06/10/21 169 lb 15.6 oz (77.1 kg)   Physical Exam Vitals reviewed.  Constitutional:      Appearance: Normal appearance.  Cardiovascular:     Rate and Rhythm: Normal rate and regular rhythm.     Pulses: Normal pulses.     Heart sounds: Normal heart sounds.  Pulmonary:     Effort: Pulmonary effort is normal.     Breath sounds: Examination of the right-lower field reveals decreased breath sounds. Decreased breath sounds present.  Abdominal:     Palpations: Abdomen is rigid.  Musculoskeletal:     Right lower leg: 3+ Edema present.     Left lower leg: 3+ Edema present.  Feet:     Right foot:     Skin integrity: Skin breakdown (dorsal of foot shallow with skin denudation measuring 5 cm) present.  Neurological:     General: No focal deficit present.     Mental Status: He is alert and oriented to person, place, and time.  Psychiatric:        Mood and Affect: Mood normal.        Behavior: Behavior normal.    LABORATORY DATA:  I have reviewed the labs as listed.  CBC Latest Ref Rng & Units 06/28/2021 06/14/2021 06/10/2021  WBC 4.0 - 10.5 K/uL 7.7 9.2 5.0  Hemoglobin 13.0 - 17.0 g/dL 10.8(L) 10.3(L) 9.3(L)  Hematocrit 39.0 - 52.0 % 32.7(L) 31.1(L) 28.2(L)  Platelets 150 - 400 K/uL 277 276 224   CMP Latest Ref Rng & Units 06/28/2021 06/14/2021 06/10/2021  Glucose 70 - 99 mg/dL 94 107(H) 95  BUN 6 - 20 mg/dL _0 Creatinine 0.61 -  1.24 mg/dL 0.90 0.75 0.56(L)  Sodium 135 - 145 mmol/L 137 135 136  Potassium 3.5 - 5.1 mmol/L 3.1(L) 3.8 3.8  Chloride 98 - 111 mmol/L 99 104 106  CO2 22 - 32 mmol/L _1 Calcium 8.9 - 10.3 mg/dL 7.7(L) 7.9(L) 7.7(L)  Total Protein 6.5 - 8.1 g/dL 4.9(L) 4.7(L) 4.6(L)  Total Bilirubin 0.3 - 1.2 mg/dL 1.1 0.6 0.2(L)  Alkaline Phos 38 - 126 U/L 249(H) 255(H) 273(H)  AST 15 - 41 U/L 34 31 29  ALT 0 - 44 U/L _2 DIAGNOSTIC IMAGING:  I have independently reviewed the scans and discussed with the patient. CT ABDOMEN PELVIS W CONTRAST  Result Date: 06/01/2021 CLINICAL DATA:  Abdominal pain.  Metastatic colon cancer. EXAM: CT ABDOMEN AND PELVIS WITH CONTRAST TECHNIQUE: Multidetector CT imaging of the abdomen and pelvis was performed using the standard protocol following bolus administration of intravenous contrast. CONTRAST:  149m OMNIPAQUE IOHEXOL 300 MG/ML  SOLN COMPARISON:  CT of the chest abdomen pelvis dated 04/19/2021. FINDINGS: Lower chest: There is a 5 mm nodule in the right middle lobe. Right lung base atelectasis. The visualized left lung is clear. Partially visualized central venous line with tip at the cavoatrial junction. No intra-abdominal free air. Moderate ascites, increased since the prior CT. Hepatobiliary: Multiple hepatic metastatic disease, relatively similar to prior CT. There is irregularity of the liver contour which may represent cirrhosis or pseudo cirrhosis. No intrahepatic biliary dilatation. The gallbladder is unremarkable. Pancreas: Unremarkable.  No pancreatic ductal dilatation or surrounding inflammatory changes. Spleen: Normal in size without focal abnormality. Adrenals/Urinary Tract: The adrenal glands unremarkable. There is a 1 cm right renal cyst. There is no hydronephrosis on either side. There is symmetric enhancement and excretion of contrast by both kidneys. The urinary bladder is grossly unremarkable. Stomach/Bowel: There is moderate stool throughout the  colon. There is segmental thickening of the sigmoid colon which may represent colitis or related to underlying infiltrative process. There is no bowel obstruction. The appendix is unremarkable. Vascular/Lymphatic: Mild aortoiliac atherosclerotic disease. The IVC is unremarkable. No portal venous gas. No definite adenopathy. Reproductive: The prostate and seminal vesicles are grossly unremarkable. Other: Diffuse subcutaneous edema and anasarca, new since the prior CT. Musculoskeletal: Degenerative changes of the spine. No acute osseous pathology. IMPRESSION: 1. Segmental thickening of the sigmoid colon may represent colitis or related to underlying infiltrative process. No bowel obstruction. Normal appendix. 2. Moderate ascites, increased since the prior CT. 3. Multiple hepatic metastatic disease, relatively similar to prior CT. 4. Diffuse subcutaneous edema and anasarca, new since the prior CT. 5. There is a 5 mm nodule in the right middle lobe. 6. Aortic Atherosclerosis (ICD10-I70.0). Electronically Signed   By: Anner Crete M.D.   On: 06/01/2021 01:24   US Venous Img Lower Bilateral (DVT)  Result Date: 06/10/2021 CLINICAL DATA:  Bilateral lower extremity edema. EXAM: BILATERAL LOWER EXTREMITY VENOUS DOPPLER ULTRASOUND TECHNIQUE: Gray-scale sonography with graded compression, as well as color Doppler and duplex ultrasound were performed to evaluate the lower extremity deep venous systems from the level of the common femoral vein and including the common femoral, femoral, profunda femoral, popliteal and calf veins including the posterior tibial, peroneal and gastrocnemius veins when visible. The superficial great saphenous vein was also interrogated. Spectral Doppler was utilized to evaluate flow at rest and with distal augmentation maneuvers in the common femoral, femoral and popliteal veins. COMPARISON:  None. FINDINGS: RIGHT LOWER EXTREMITY Common Femoral Vein: No evidence of thrombus. Normal  compressibility, respiratory phasicity and response to augmentation. Saphenofemoral Junction: No evidence of thrombus. Normal compressibility and flow on color Doppler imaging. Profunda Femoral Vein: No evidence of thrombus. Normal compressibility and flow on color Doppler imaging. Femoral Vein: No evidence of thrombus. Normal compressibility, respiratory phasicity and response to augmentation. Popliteal Vein: No evidence of thrombus. Normal compressibility, respiratory phasicity and response to augmentation. Calf Veins: No evidence of thrombus. Normal compressibility and flow on color Doppler imaging. Other Findings:  Subcutaneous edema. LEFT LOWER EXTREMITY Common Femoral Vein: No evidence of thrombus. Normal compressibility, respiratory phasicity and response to augmentation. Saphenofemoral Junction: No evidence of thrombus. Normal compressibility and flow on color Doppler imaging. Profunda Femoral Vein: No evidence of thrombus. Normal compressibility and flow on color Doppler imaging. Femoral Vein: No evidence of thrombus. Normal compressibility, respiratory phasicity and response to augmentation. Popliteal Vein: No evidence of thrombus. Normal compressibility, respiratory phasicity and response to augmentation. Calf Veins: No evidence of thrombus. Normal compressibility and flow on color Doppler imaging. Other Findings:  Subcutaneous edema. IMPRESSION: No evidence of deep venous thrombosis in either lower extremity. Electronically Signed   By: Misty Stanley M.D.   On: 06/10/2021 10:43   DG Chest Portable 1 View  Result Date: 06/10/2021 CLINICAL DATA:  Leg swelling. EXAM: PORTABLE CHEST 1 VIEW COMPARISON:  None. FINDINGS: 0913 hours. Low lung volumes. Basilar atelectasis with possible tiny effusions. Right Port-A-Cath noted. Telemetry leads overlie the chest. IMPRESSION: Low volume film without edema or focal consolidation. Basilar atelectasis and  possible tiny effusions. Electronically Signed   By: Misty Stanley M.D.   On: 06/10/2021 09:32     ASSESSMENT:  1.  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 - CT [facility] with contrast on 12/26/2020 showed extensive hepatic metastatic disease and retroperitoneal adenopathy.  CT chest without contrast on 12/26/2020 showed several small subcentimeter lung nodules consistent with metastatic disease.  Liver morphology suggests underlying cirrhosis. - Liver biopsy on 01/10/2021 consistent with metastatic adenocarcinoma, CK20 and CDX2 positive, negative for CK7, TTF-1, PSA and prostein. - Colonoscopy and biopsy of the sigmoid mass on 01/13/2021 consistent with adenocarcinoma. - 10 pound weight loss prior to start of therapy. - FOLFOX started on 01/25/2021. - Foundation 1 testing on 01/10/2021 showed MSI-stable, TMB low, negative for RAS/BRAF/HER2 mutation.  2.  Social/family history: - Lives at home with his wife.  He drove truck and operated equipment.  No smoking history. - Father had prostate cancer   PLAN:  1.  Metastatic sigmoid colon cancer to liver and lungs: - CT CAP on 04/19/2021 showed interval decrease in size of multiple lung nodules and decrease in numerous liver lesions. - We held his treatment, last treatment being on 05/22/2021 due to poor tolerance. - We reviewed his CEA which continues to improve at 16.2 (06/14/2021).  Previously 28.6. - Reviewed his labs today which showed normal white count and platelet count.  LFTs are grossly normal except elevated alk phos. - We will continue to hold treatment for his cancer at this time because of poor tolerance. - If continues to deteriorate, will consider best supportive care.  RTC 3 weeks for follow-up with labs.  2.  Coccygeal pain: - Continue half tablet of MS IR at bedtime as needed.  He is not requiring it every day.  3.  Hypokalemia: - Continue potassium 20 mEq daily.  Potassium today is 3.1.  Will replete in the  clinic.  4.  Difficulty falling/staying asleep: - Continue Restoril 15 mg at bedtime.  5.  Ascites/leg swelling: - He has third spacing of fluids with albumin 2.0.  He had Doppler on 06/10/2021 which was negative for DVT. - Abdomen is distended.  Will do diagnostic and therapeutic paracentesis. - We will give albumin 50 g while fluid is drawn. - He is taking Lasix 40 mg daily.  We will add spironolactone 25 mg daily.  Systolic blood pressure is staying around 98.  He will continue midodrine 10 mg 3 times daily. - He has a right dorsal blister with skin peeling off over 5 cm diameter.  No clear sign of infection.  We will ask wound care evaluation this afternoon.  6.  Hypomagnesemia: - Continue magnesium twice daily.  7.  EGFR antibody induced rash: - Rash is no longer problems since we held Vectibix.  We will restart doxycycline upon restarting Vectibix.  8.  Anxiety: - Continue Ativan 0.5 mg daily as needed.   Orders placed this encounter:  Orders Placed This Encounter  Procedures   US Paracentesis     Derek Jack, MD [place] [phone]   I, Thana Ates, am acting as a scribe for Dr. Derek Jack.  I, Derek Jack MD, have reviewed the above documentation for accuracy and completeness, and I agree with the above.

## 2021-06-28 ENCOUNTER — Encounter (HOSPITAL_COMMUNITY): Payer: Self-pay

## 2021-06-28 ENCOUNTER — Inpatient Hospital Stay (HOSPITAL_BASED_OUTPATIENT_CLINIC_OR_DEPARTMENT_OTHER): Payer: Medicaid Other | Admitting: Hematology

## 2021-06-28 ENCOUNTER — Encounter (HOSPITAL_COMMUNITY): Payer: Self-pay | Admitting: Physical Therapy

## 2021-06-28 ENCOUNTER — Other Ambulatory Visit: Payer: Self-pay

## 2021-06-28 ENCOUNTER — Inpatient Hospital Stay (HOSPITAL_COMMUNITY): Payer: Medicaid Other

## 2021-06-28 ENCOUNTER — Ambulatory Visit (HOSPITAL_COMMUNITY): Payer: Medicaid Other | Attending: Hematology | Admitting: Physical Therapy

## 2021-06-28 ENCOUNTER — Ambulatory Visit (HOSPITAL_COMMUNITY)
Admission: RE | Admit: 2021-06-28 | Discharge: 2021-06-28 | Disposition: A | Discharge status: Home / Self Care | Payer: Medicaid Other | Source: Ambulatory Visit | Attending: Hematology | Admitting: Hematology

## 2021-06-28 VITALS — BP 98/76 | HR 99 | Temp 96.4°F | Resp 18 | Ht 67.0 in | Wt 194.0 lb

## 2021-06-28 DIAGNOSIS — C189 Malignant neoplasm of colon, unspecified: Secondary | ICD-10-CM | POA: Diagnosis not present

## 2021-06-28 DIAGNOSIS — R180 Malignant ascites: Secondary | ICD-10-CM

## 2021-06-28 DIAGNOSIS — C787 Secondary malignant neoplasm of liver and intrahepatic bile duct: Secondary | ICD-10-CM | POA: Insufficient documentation

## 2021-06-28 DIAGNOSIS — S91301S Unspecified open wound, right foot, sequela: Secondary | ICD-10-CM | POA: Diagnosis present

## 2021-06-28 DIAGNOSIS — E876 Hypokalemia: Secondary | ICD-10-CM

## 2021-06-28 DIAGNOSIS — R609 Edema, unspecified: Secondary | ICD-10-CM | POA: Diagnosis not present

## 2021-06-28 DIAGNOSIS — S81801S Unspecified open wound, right lower leg, sequela: Secondary | ICD-10-CM | POA: Diagnosis present

## 2021-06-28 DIAGNOSIS — R188 Other ascites: Secondary | ICD-10-CM | POA: Diagnosis not present

## 2021-06-28 DIAGNOSIS — R600 Localized edema: Secondary | ICD-10-CM | POA: Diagnosis present

## 2021-06-28 DIAGNOSIS — R262 Difficulty in walking, not elsewhere classified: Secondary | ICD-10-CM | POA: Insufficient documentation

## 2021-06-28 LAB — BODY FLUID CELL COUNT WITH DIFFERENTIAL
Eos, Fluid: 0 %
Lymphs, Fluid: 59 %
Monocyte-Macrophage-Serous Fluid: 15 % — ABNORMAL LOW (ref 50–90)
Neutrophil Count, Fluid: 26 % — ABNORMAL HIGH (ref 0–25)
Total Nucleated Cell Count, Fluid: 159 cu mm (ref 0–1000)

## 2021-06-28 LAB — CBC WITH DIFFERENTIAL/PLATELET
Abs Immature Granulocytes: 0.02 10*3/uL (ref 0.00–0.07)
Basophils Absolute: 0.1 10*3/uL (ref 0.0–0.1)
Basophils Relative: 1 %
Eosinophils Absolute: 0.5 10*3/uL (ref 0.0–0.5)
Eosinophils Relative: 6 %
HCT: 32.7 % — ABNORMAL LOW (ref 39.0–52.0)
Hemoglobin: 10.8 g/dL — ABNORMAL LOW (ref 13.0–17.0)
Immature Granulocytes: 0 %
Lymphocytes Relative: 35 %
Lymphs Abs: 2.7 10*3/uL (ref 0.7–4.0)
MCH: 31.9 pg (ref 26.0–34.0)
MCHC: 33 g/dL (ref 30.0–36.0)
MCV: 96.5 fL (ref 80.0–100.0)
Monocytes Absolute: 0.7 10*3/uL (ref 0.1–1.0)
Monocytes Relative: 10 %
Neutro Abs: 3.7 10*3/uL (ref 1.7–7.7)
Neutrophils Relative %: 48 %
Platelets: 277 10*3/uL (ref 150–400)
RBC: 3.39 MIL/uL — ABNORMAL LOW (ref 4.22–5.81)
RDW: 16.8 % — ABNORMAL HIGH (ref 11.5–15.5)
WBC: 7.7 10*3/uL (ref 4.0–10.5)
nRBC: 0 % (ref 0.0–0.2)

## 2021-06-28 LAB — COMPREHENSIVE METABOLIC PANEL
ALT: 13 U/L (ref 0–44)
AST: 34 U/L (ref 15–41)
Albumin: 2 g/dL — ABNORMAL LOW (ref 3.5–5.0)
Alkaline Phosphatase: 249 U/L — ABNORMAL HIGH (ref 38–126)
Anion gap: 8 (ref 5–15)
BUN: 11 mg/dL (ref 6–20)
CO2: 30 mmol/L (ref 22–32)
Calcium: 7.7 mg/dL — ABNORMAL LOW (ref 8.9–10.3)
Chloride: 99 mmol/L (ref 98–111)
Creatinine, Ser: 0.9 mg/dL (ref 0.61–1.24)
GFR, Estimated: >60 mL/min (ref >60)
Glucose, Bld: 94 mg/dL (ref 70–99)
Potassium: 3.1 mmol/L — ABNORMAL LOW (ref 3.5–5.1)
Sodium: 137 mmol/L (ref 135–145)
Total Bilirubin: 1.1 mg/dL (ref 0.3–1.2)
Total Protein: 4.9 g/dL — ABNORMAL LOW (ref 6.5–8.1)

## 2021-06-28 LAB — ALBUMIN, PLEURAL OR PERITONEAL FLUID: Albumin, Fluid: <1.5 g/dL

## 2021-06-28 LAB — GRAM STAIN: Gram Stain: NONE SEEN

## 2021-06-28 LAB — MAGNESIUM: Magnesium: 2 mg/dL (ref 1.7–2.4)

## 2021-06-28 MED ORDER — FUROSEMIDE 40 MG PO TABS: 40.0000 mg | ORAL_TABLET | Freq: Every day | ORAL | 2 refills | Status: DC | PRN

## 2021-06-28 MED ORDER — ALBUMIN HUMAN 25 % IV SOLN
INTRAVENOUS | Status: AC
  Administered 2021-06-28: 11:00:00 50 g via INTRAVENOUS
  Filled 2021-06-28: qty 200

## 2021-06-28 MED ORDER — SODIUM CHLORIDE FLUSH 0.9 % IV SOLN
INTRAVENOUS | Status: AC
  Administered 2021-06-28: 11:00:00 10 mL
  Filled 2021-06-28: qty 20

## 2021-06-28 MED ORDER — SPIRONOLACTONE 25 MG PO TABS: 25.0000 mg | ORAL_TABLET | Freq: Every day | ORAL | 1 refills | Status: DC

## 2021-06-28 MED ORDER — SODIUM CHLORIDE 0.9 % IV SOLN
8.0000 mg | Freq: Once | INTRAVENOUS | Status: AC
  Administered 2021-06-28: 10:00:00 8 mg via INTRAVENOUS
  Filled 2021-06-28: qty 4

## 2021-06-28 MED ORDER — MORPHINE SULFATE 15 MG PO TABS
15.0000 mg | ORAL_TABLET | Freq: Once | ORAL | Status: AC
  Administered 2021-06-28: 10:00:00 15 mg via ORAL
  Filled 2021-06-28: qty 1

## 2021-06-28 MED ORDER — ALBUMIN HUMAN 25 % IV SOLN: 50.0000 g | Freq: Once | INTRAVENOUS | Status: AC

## 2021-06-28 MED ORDER — HEPARIN SOD (PORK) LOCK FLUSH 100 UNIT/ML IV SOLN: 500.0000 [IU] | Freq: Once | INTRAVENOUS | Status: AC

## 2021-06-28 MED ORDER — SODIUM CHLORIDE 0.9 % IV SOLN: Freq: Once | INTRAVENOUS | Status: AC

## 2021-06-28 MED ORDER — HEPARIN SOD (PORK) LOCK FLUSH 100 UNIT/ML IV SOLN
INTRAVENOUS | Status: AC
  Administered 2021-06-28: 12:00:00 500 [IU] via INTRAVENOUS
  Filled 2021-06-28: qty 5

## 2021-06-28 MED ORDER — POTASSIUM CHLORIDE CRYS ER 20 MEQ PO TBCR
40.0000 meq | EXTENDED_RELEASE_TABLET | Freq: Once | ORAL | Status: AC
  Administered 2021-06-28: 10:00:00 40 meq via ORAL
  Filled 2021-06-28: qty 2

## 2021-06-28 NOTE — Progress Notes (Signed)
PT tolerated left sided paracentesis procedure and 50G IV albumin well today and 4 Liters of clear yellow fluid removed with labs collected and sent for processing. PT left with brother via wheelchair from waiting area at this time with NAD. PT verbalized understanding of post procedure instructions.

## 2021-06-28 NOTE — Procedures (Addendum)
° °  LLQ US guided paracentesis  4 Liters clear yellow fluid Sent for labs per MD  Tolerated well

## 2021-06-28 NOTE — Patient Instructions (Signed)
[facility]  Discharge Instructions: Thank you for choosing [facility] to provide your oncology and hematology care.  If you have a lab appointment with the Cancer Center, please come in thru the Main Entrance and check in at the main information desk.  Wear comfortable clothing and clothing appropriate for easy access to any Portacath or PICC line.   We strive to give you quality time with your provider. You may need to reschedule your appointment if you arrive late (15 or more minutes).  Arriving late affects you and other patients whose appointments are after yours.  Also, if you miss three or more appointments without notifying the office, you may be dismissed from the clinic at the provider's discretion.      For prescription refill requests, have your pharmacy contact our office and allow 72 hours for refills to be completed.        To help prevent nausea and vomiting after your treatment, we encourage you to take your nausea medication as directed.  BELOW ARE SYMPTOMS THAT SHOULD BE REPORTED IMMEDIATELY: *FEVER GREATER THAN 100.4 F (38 C) OR HIGHER *CHILLS OR SWEATING *NAUSEA AND VOMITING THAT IS NOT CONTROLLED WITH YOUR NAUSEA MEDICATION *UNUSUAL SHORTNESS OF BREATH *UNUSUAL BRUISING OR BLEEDING *URINARY PROBLEMS (pain or burning when urinating, or frequent urination) *BOWEL PROBLEMS (unusual diarrhea, constipation, pain near the anus) TENDERNESS IN MOUTH AND THROAT WITH OR WITHOUT PRESENCE OF ULCERS (sore throat, sores in mouth, or a toothache) UNUSUAL RASH, SWELLING OR PAIN  UNUSUAL VAGINAL DISCHARGE OR ITCHING   Items with * indicate a potential emergency and should be followed up as soon as possible or go to the Emergency Department if any problems should occur.  Please show the CHEMOTHERAPY ALERT CARD or IMMUNOTHERAPY ALERT CARD at check-in to the Emergency Department and triage nurse.  Should you have questions after your visit or need to cancel  or reschedule your appointment, please contact [facility] [phone]  and follow the prompts.  Office hours are 8:00 a.m. to 4:30 p.m. Monday - Friday. Please note that voicemails left after 4:00 p.m. may not be returned until the following business day.  We are closed weekends and major holidays. You have access to a nurse at all times for urgent questions. Please call the main number to the clinic [phone] and follow the prompts.  For any non-urgent questions, you may also contact your provider using MyChart. We now offer e-Visits for anyone 18 and older to request care online for non-urgent symptoms. For details visit [email or web address].   Also download the MyChart app! Go to the app store, search "MyChart", open the app, select [facility], and log in with your MyChart username and password.  Due to Covid, a mask is required upon entering the hospital/clinic. If you do not have a mask, one will be given to you upon arrival. For doctor visits, patients may have 1 support person aged 18 or older with them. For treatment visits, patients cannot have anyone with them due to current Covid guidelines and our immunocompromised population.  

## 2021-06-28 NOTE — Progress Notes (Signed)
Patient presents today for chemotherapy treatment.  Vital signs are stable.  Patient complains of fatigue, nausea, and bloating today.  Patient was evaluated by Dr. Delton Coombes.  We will hold treatment today.  He will get Potassium 40 mEq PO x 1 dose today (Potassium=3.1 today), Zofran 8 mg IV x 1 dose today, and Morphine 15 mg PO x 1 dose today per MD orders.  Patient will go down for a paracentesis today also.    Patient tolerated IV Zofran well with no complaints voiced.  Patient left via wheelchair with brother in stable condition.  Vital signs stable at discharge.  Follow up as scheduled.

## 2021-06-28 NOTE — Therapy (Signed)
[place], [place], [address] Phone: [phone]   Fax:  [phone]  Wound Care Evaluation  Patient Details  Name: Henry Johns MRN: [id number] Date of Birth: 1961-04-09 Referring Provider (PT): Derek Jack   Encounter Date: 06/28/2021   PT End of Session - 06/28/21 1632     Visit Number 1    Number of Visits 18    Date for PT Re-Evaluation 08/09/21    Authorization Type [employer] medicaid Pt will need authorization after visit 13 eval +12 tre    Progress Note Due on Visit 10    PT Start Time 7893    PT Stop Time 1635    PT Time Calculation (min) 60 min    Activity Tolerance Patient tolerated treatment well    Behavior During Therapy [facility] for tasks assessed/performed             Past Medical History:  Diagnosis Date   Colon cancer ([place])    Diabetes mellitus without complication ([place])    Hypertension     Past Surgical History:  Procedure Laterality Date   BIOPSY  01/13/2021   Procedure: BIOPSY;  Surgeon: Harvel Quale, MD;  [facility];  Service: Gastroenterology;;   CATARACT EXTRACTION Right    COLONOSCOPY WITH PROPOFOL N/A 01/13/2021   Procedure: COLONOSCOPY WITH PROPOFOL;  Surgeon: Harvel Quale, MD;  [facility];  Service: Gastroenterology;  Laterality: N/A;  11:05   ESOPHAGOGASTRODUODENOSCOPY (EGD) WITH PROPOFOL N/A 01/13/2021   Procedure: ESOPHAGOGASTRODUODENOSCOPY (EGD) WITH PROPOFOL;  Surgeon: Harvel Quale, MD;  [facility];  Service: Gastroenterology;  Laterality: N/A;   IR IMAGING GUIDED PORT INSERTION  01/24/2021   LIVER BIOPSY  01/10/2021   U/S guided   POLYPECTOMY  01/13/2021   Procedure: POLYPECTOMY;  Surgeon: Harvel Quale, MD;  [facility];  Service: Gastroenterology;;    There were no vitals filed for this visit.     [facility] PT Assessment - 06/28/21 0001       Assessment    Medical Diagnosis Non healing wounds on RT LE    Referring Provider (PT) Derek Jack    Onset Date/Surgical Date 06/14/21    Next MD Visit next week    Prior Therapy none      Precautions   Precautions Fall      Restrictions   Weight Bearing Restrictions No      Balance Screen   Has the patient fallen in the past 6 months No    Has the patient had a decrease in activity level because of a fear of falling?  Yes    Is the patient reluctant to leave their home because of a fear of falling?  Yes      Prior Function   Level of Independence Independent      Cognition   Overall Cognitive Status Within Functional Limits for tasks assessed             Wound Therapy - 06/28/21 1614     Subjective Henry Johns is a 60 yo male with a hx of colon cancer who is actively recieving chemotherapy.  He noted two weeks ago that both of his legs started to swell.  The MD put him on diuretics but it did nothing for his swelling.  Blisters started to appear on his Rt LE, they popped and left wounds on his leg.  He states the Left leg has no  wound it is only swollen.  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 He states that when he was at the hospital they checked his arterial circulation and said that it was fine.  He is now being referred to skilled PT to assist in wound healing.    Patient and Family Stated Goals wounds to heal    Date of Onset 06/14/21    Prior Treatments ointment, MD dressing    Pain Scale 0-10    Pain Score 0-No pain    Evaluation and Treatment Procedures Explained to Patient/Family Yes    Evaluation and Treatment Procedures agreed to    Wound Properties Date First Assessed: 06/28/21 Time First Assessed: 1530 Wound Type: Other (Comment) , Skin shear/abrasion  Location: Pretibial Location Orientation: Right Wound Description (Comments): most superior of lateral wounds Present on Admission: Yes   Wound Image Images linked: 2     Dressing Type None    Dressing Changed New    Dressing Status None    Dressing Change Frequency PRN    Site / Wound Assessment Dry;Friable;Dusky    % Wound base Red or Granulating 0%    % Wound base Other/Granulation Tissue (Comment) 100%    Peri-wound Assessment Edema    Wound Length (cm) 1.2 cm    Wound Width (cm) 2.2 cm    Wound Surface Area (cm^2) 2.64 cm^2    Drainage Amount None    Treatment Cleansed;Debridement (Selective)    Wound Properties Date First Assessed: 06/28/21 Time First Assessed: 1545 Wound Type: Other (Comment) Location: Leg Location Orientation: Right;Lateral Wound Description (Comments): inferior lateral wound Present on Admission: Yes   Wound Image --   see above   Site / Wound Assessment Dry;Dusky    % Wound base Red or Granulating 0%    % Wound base Other/Granulation Tissue (Comment) 100%    Peri-wound Assessment Edema    Wound Length (cm) 2.3 cm    Wound Width (cm) 2.2 cm    Wound Surface Area (cm^2) 5.06 cm^2    Drainage Amount None    Treatment Cleansed;Debridement (Selective)    Wound Properties Date First Assessed: 06/28/21 Time First Assessed: 1550 Wound Type: Other (Comment) Location: Foot Location Orientation: Right Wound Description (Comments): dorsal wound Present on Admission: Yes   Dressing Type None    Dressing Changed New    Dressing Change Frequency PRN    Site / Wound Assessment Yellow    % Wound base Red or Granulating 0%    % Wound base Yellow/Fibrinous Exudate 100%    Peri-wound Assessment Edema;Erythema (non-blanchable)    Wound Length (cm) 4.8 cm    Wound Width (cm) 5 cm    Drainage Amount Scant    Drainage Description Serous    Treatment Cleansed;Debridement (Selective)    Selective Debridement - Location wound beds    Selective Debridement - Tools Used Forceps;Scalpel;Scissors    Selective Debridement - Tissue Removed devitalized tissue    Wound Therapy - Clinical Statement see plan    Dressing  Lateral wounds vaseline and  profore compression dressing    Dressing dorsal wound was scored with scapel f/b medihoney 4x4 and profore compression dressing                   Objective measurements completed on examination: See above findings.             PT Education - 06/28/21 1631  Education Details Keep dressing dry; keep dressings on if they are comfortable if not take one layer off at a time.    Methods Explanation    Comprehension Verbalized understanding              PT Short Term Goals - 06/28/21 1644       PT SHORT TERM GOAL #1   Title PT Rt dorsal wound wound to be 70% granulated    Time 3    Period Weeks    Status New    Target Date 07/19/21      PT SHORT TERM GOAL #2   Title Pt lateral wounds on Rt LE to be healed.    Time 3    Period Weeks    Status New      PT SHORT TERM GOAL #3   Title Edema to be minimal and pt to have been measured for compression garments.    Time 3    Period Weeks    Status New               PT Long Term Goals - 06/28/21 1645       PT LONG TERM GOAL #1   Title Wound on dorsal aspect of Rt foot to be healed    Time 6    Period Weeks    Status New    Target Date 08/09/21                  Plan - 06/28/21 1634     Clinical Impression Statement 06/28/21 1614  Subjective Assessment  Subjective Henry Johns is a 60 yo male with a hx of colon cancer who is actively recieving chemotherapy.  He noted two weeks ago that both of his legs started to swell.  The MD put him on diuretics but it did nothing for his swelling.  Blisters started to appear on his Rt LE, they popped and left wounds on his leg.  He states the Left leg has no wound it is only swollen.  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 He states that when he was at the hospital they checked his arterial circulation and said that it was fine.  He is now being referred to skilled PT to assist in wound healing.    Henry Johns will  benefit from skilled PT for debridement to remove the eschar to allow granulation to occur and compression dressing to improve wound healing enviornment.    Personal Factors and Comorbidities Comorbidity 1;Fitness    Comorbidities colon cancer with active chemo    Examination-Activity Limitations Bathing;Locomotion Level;Hygiene/Grooming;Dressing    Examination-Participation Restrictions Community Activity;Shop    Stability/Clinical Decision Making Evolving/Moderate complexity    Clinical Decision Making Moderate    Rehab Potential Good    PT Frequency 2x / week    PT Duration 6 weeks    PT Treatment/Interventions Other (comment);Compression bandaging;Patient/family education   debridement, compression dressing   PT Next Visit Plan assess comfort of profore.  Continue with debridement of wound.  Please give pt exercises sheet for LE lymphedma.    PT Home Exercise Plan keep dressing dry    Consulted and Agree with Plan of Care Patient             Patient will benefit from skilled therapeutic intervention in order to improve the following deficits and impairments:  Decreased skin integrity, Increased edema, Difficulty walking  Visit Diagnosis: Multiple  open wounds of lower leg, right, sequela  Open wound of foot, right, sequela  Localized edema  Difficulty in walking, not elsewhere classified    Problem List Patient Active Problem List   Diagnosis Date Noted   Colitis 06/01/2021   Hypokalemia 06/01/2021   Hyperlipidemia 06/01/2021   Chronic pain 06/01/2021   Metastasis to liver ([place]) 01/04/2021   Colon cancer metastasized to liver ([place]) 01/04/2021   Other constipation 01/04/2021   Rectal bleeding 01/04/2021   Nausea without vomiting 01/04/2021   Cancer associated pain 01/04/2021   Diabetes mellitus with neuropathy ([place])     Deniah Saia,CINDY, PT 06/28/2021, 4:50 PM  [place] [address] [place], [place], [address] Phone:  [phone]   Fax:  [phone]  Name: Henry Johns MRN: [id number] Date of Birth: 1961-05-13

## 2021-06-28 NOTE — Patient Instructions (Signed)
[facility] at [facility] Discharge Instructions   You were seen and examined today by Dr. Katragadda.     Thank you for choosing [facility] at [facility] to provide your oncology and hematology care.  To afford each patient quality time with our provider, please arrive at least 15 minutes before your scheduled appointment time.   If you have a lab appointment with the Cancer Center please come in thru the Main Entrance and check in at the main information desk.  You need to re-schedule your appointment should you arrive 10 or more minutes late.  We strive to give you quality time with our providers, and arriving late affects you and other patients whose appointments are after yours.  Also, if you no show three or more times for appointments you may be dismissed from the clinic at the providers discretion.     Again, thank you for choosing [facility].  Our hope is that these requests will decrease the amount of time that you wait before being seen by our physicians.       _____________________________________________________________  Should you have questions after your visit to [facility], please contact our office at [phone] and follow the prompts.  Our office hours are 8:00 a.m. and 4:30 p.m. Monday - Friday.  Please note that voicemails left after 4:00 p.m. may not be returned until the following business day.  We are closed weekends and major holidays.  You do have access to a nurse 24-7, just call the main number to the clinic [phone] and do not press any options, hold on the line and a nurse will answer the phone.    For prescription refill requests, have your pharmacy contact our office and allow 72 hours.    Due to Covid, you will need to wear a mask upon entering the hospital. If you do not have a mask, a mask will be given to you at the Main Entrance upon arrival. For doctor visits, patients may  have 1 support person age 18 or older with them. For treatment visits, patients can not have anyone with them due to social distancing guidelines and our immunocompromised population.      

## 2021-06-29 LAB — CYTOLOGY - NON PAP

## 2021-06-30 ENCOUNTER — Ambulatory Visit (HOSPITAL_COMMUNITY): Payer: Medicaid Other

## 2021-06-30 ENCOUNTER — Inpatient Hospital Stay (HOSPITAL_COMMUNITY): Payer: Medicaid Other

## 2021-06-30 ENCOUNTER — Other Ambulatory Visit: Payer: Self-pay

## 2021-06-30 ENCOUNTER — Encounter (HOSPITAL_COMMUNITY): Payer: Self-pay

## 2021-06-30 DIAGNOSIS — R600 Localized edema: Secondary | ICD-10-CM

## 2021-06-30 DIAGNOSIS — S81801S Unspecified open wound, right lower leg, sequela: Secondary | ICD-10-CM | POA: Diagnosis not present

## 2021-06-30 DIAGNOSIS — S91301S Unspecified open wound, right foot, sequela: Secondary | ICD-10-CM

## 2021-06-30 DIAGNOSIS — R262 Difficulty in walking, not elsewhere classified: Secondary | ICD-10-CM

## 2021-06-30 NOTE — Therapy (Signed)
[place], [place], [address] Phone: [phone]   Fax:  [phone]  Wound Care Therapy  Patient Details  Name: Henry Johns MRN: [id number] Date of Birth: 1960/07/26 Referring Provider (PT): Derek Jack   Encounter Date: 06/30/2021   PT End of Session - 06/30/21 1652     Visit Number 2    Number of Visits 18    Date for PT Re-Evaluation 08/09/21    Authorization Type [employer] medicaid Pt will need authorization after visit 13 eval +12 tre    Progress Note Due on Visit 10    PT Start Time 1540    PT Stop Time 1627    PT Time Calculation (min) 47 min    Activity Tolerance Patient tolerated treatment well    Behavior During Therapy [facility] for tasks assessed/performed             Past Medical History:  Diagnosis Date   Colon cancer ([place])    Diabetes mellitus without complication ([place])    Hypertension     Past Surgical History:  Procedure Laterality Date   BIOPSY  01/13/2021   Procedure: BIOPSY;  Surgeon: Harvel Quale, MD;  [facility];  Service: Gastroenterology;;   CATARACT EXTRACTION Right    COLONOSCOPY WITH PROPOFOL N/A 01/13/2021   Procedure: COLONOSCOPY WITH PROPOFOL;  Surgeon: Harvel Quale, MD;  [facility];  Service: Gastroenterology;  Laterality: N/A;  11:05   ESOPHAGOGASTRODUODENOSCOPY (EGD) WITH PROPOFOL N/A 01/13/2021   Procedure: ESOPHAGOGASTRODUODENOSCOPY (EGD) WITH PROPOFOL;  Surgeon: Harvel Quale, MD;  [facility];  Service: Gastroenterology;  Laterality: N/A;   IR IMAGING GUIDED PORT INSERTION  01/24/2021   LIVER BIOPSY  01/10/2021   U/S guided   POLYPECTOMY  01/13/2021   Procedure: POLYPECTOMY;  Surgeon: Harvel Quale, MD;  [facility];  Service: Gastroenterology;;    There were no vitals filed for this visit.    Subjective Assessment - 06/30/21 1636     Subjective Pt  arrived stating dressings were too tight, had to remove outer layer of dressings for comfort.  Reports pain scale 1/10 Rt foot and LBP 3/10.    Currently in Pain? Yes    Pain Score 1     Pain Location Foot    Pain Orientation Right    Pain Descriptors / Indicators Burning    Pain Type Acute pain    Multiple Pain Sites Yes    Pain Score 3    Pain Location Back    Pain Orientation Lower    Pain Descriptors / Indicators Aching;Sore    Pain Type Chronic pain    Pain Onset More than a month ago    Pain Frequency Constant                       Wound Therapy - 06/30/21 1636     Subjective Pt arrived stating dressings were too tight, had to remove outer layer of dressings for comfort.  Reports pain scale 1/10 Rt foot and LBP 3/10.    Patient and Family Stated Goals wounds to heal    Date of Onset 06/14/21    Prior Treatments ointment, MD dressing    Pain Scale 0-10    Evaluation and Treatment Procedures Explained to Patient/Family Yes    Evaluation and Treatment Procedures agreed to    Wound Properties Date First Assessed: 06/28/21 Time First Assessed: 1884  Wound Type: Other (Comment) Location: Leg Location Orientation: Right;Lateral Wound Description (Comments): inferior lateral wound Present on Admission: Yes   Dressing Type Compression wrap    Dressing Changed Changed    Dressing Status Old drainage    Dressing Change Frequency PRN    Site / Wound Assessment Dry;Dusky    % Wound base Red or Granulating 50%    % Wound base Other/Granulation Tissue (Comment) 50%    Peri-wound Assessment Edema    Drainage Amount None    Treatment Cleansed;Debridement (Selective)    Wound Properties Date First Assessed: 06/28/21 Time First Assessed: 1550 Wound Type: Other (Comment) Location: Foot Location Orientation: Right Wound Description (Comments): dorsal wound Present on Admission: Yes   Dressing Type --   Medihoney, 4x4, ABD, profore lite   Dressing Changed Changed    Dressing  Status Old drainage    Dressing Change Frequency PRN    Site / Wound Assessment Yellow   green/ purple/red skin surrounding   % Wound base Red or Granulating 0%    % Wound base Yellow/Fibrinous Exudate 100%    Peri-wound Assessment Edema;Erythema (blanchable)    Drainage Amount Moderate    Drainage Description Serous;Green    Treatment Cleansed;Debridement (Selective)    Wound Properties Date First Assessed: 06/28/21 Time First Assessed: 1530 Wound Type: Other (Comment) , Skin shear/abrasion  Location: Pretibial Location Orientation: Right Wound Description (Comments): most superior of lateral wounds Present on Admission: Yes   Dressing Type Compression wrap    Dressing Changed Changed    Dressing Status Dry    Dressing Change Frequency PRN    Site / Wound Assessment Dry;Granulation tissue    % Wound base Red or Granulating 100%    % Wound base Other/Granulation Tissue (Comment) 0%    Peri-wound Assessment Edema    Drainage Amount None    Treatment Cleansed;Debridement (Selective)    Selective Debridement - Location wound beds    Selective Debridement - Tools Used Forceps;Scalpel;Scissors    Selective Debridement - Tissue Removed devitalized tissue    Wound Therapy - Clinical Statement Pt presents very weak with assistance required for transfers and required WC back to wound room.  Pt educated on exercises to complete daily to assist with strengthening, reviewed exercises with verbalized understanding.  Upon arrival pt has removal outer layer of compressoin garments, stated they were too tight.  Removed both dressings and changed to profore lite.  Pt educated on benefits with compression garments for edema control, measurements taken and given handout to place order for Lt LE.  Selective debridement for removal of slough and devitalized tissue from wound bed.  Able to removal superior wound scab with 100% granulation following removal.  Continued with medihoney to dorsal aspect wound with  additional ABD pad placed to address moderate serous green drainage, no odor present.  Reports of comfort at EOS.    Wound Therapy - Functional Problem List Comorbidity 1;Fitness    Factors Delaying/Impairing Wound Healing Other (comment)   colon cancer with active chemo   Wound Therapy - Frequency 2X / week   6 weeks   Wound Therapy - Current Recommendations --   Other (comment);Compression bandaging;Patient/family education   debridement, compression dressing   Wound Plan assess comfort of profore lite. Lt LE to be changed 1x/week until has compression garments. Continue with debridement of wound.  F/U with HEP compliance and purchase of compression garments.    Dressing  Lateral wounds vaseline and profore compression dressing    Dressing  dorsal wound was scored with scapel f/b medihoney 4x4 and profore compression dressing                       PT Short Term Goals - 06/28/21 1644       PT SHORT TERM GOAL #1   Title PT Rt dorsal wound wound to be 70% granulated    Time 3    Period Weeks    Status New    Target Date 07/19/21      PT SHORT TERM GOAL #2   Title Pt lateral wounds on Rt LE to be healed.    Time 3    Period Weeks    Status New      PT SHORT TERM GOAL #3   Title Edema to be minimal and pt to have been measured for compression garments.    Time 3    Period Weeks    Status New               PT Long Term Goals - 06/28/21 1645       PT LONG TERM GOAL #1   Title Wound on dorsal aspect of Rt foot to be healed    Time 6    Period Weeks    Status New    Target Date 08/09/21                    Patient will benefit from skilled therapeutic intervention in order to improve the following deficits and impairments:     Visit Diagnosis: Multiple open wounds of lower leg, right, sequela  Open wound of foot, right, sequela  Localized edema  Difficulty in walking, not elsewhere classified     Problem List Patient Active Problem  List   Diagnosis Date Noted   Colitis 06/01/2021   Hypokalemia 06/01/2021   Hyperlipidemia 06/01/2021   Chronic pain 06/01/2021   Metastasis to liver ([place]) 01/04/2021   Colon cancer metastasized to liver ([place]) 01/04/2021   Other constipation 01/04/2021   Rectal bleeding 01/04/2021   Nausea without vomiting 01/04/2021   Cancer associated pain 01/04/2021   Diabetes mellitus with neuropathy [facility])    Ihor Austin, LPTA/CLT; CBIS [phone]  Aldona Lento, PTA 06/30/2021, 4:55 PM  [facility] [address] [place], [place], [address] Phone: [phone]   Fax:  [phone]  Name: MALONE VANBLARCOM MRN: [id number] Date of Birth: 19-Jul-1960

## 2021-07-03 ENCOUNTER — Encounter (HOSPITAL_COMMUNITY): Payer: Self-pay | Admitting: Hematology

## 2021-07-03 LAB — CULTURE, BODY FLUID W GRAM STAIN -BOTTLE: Culture: NO GROWTH

## 2021-07-04 ENCOUNTER — Other Ambulatory Visit: Payer: Self-pay

## 2021-07-04 ENCOUNTER — Other Ambulatory Visit (HOSPITAL_COMMUNITY): Payer: Self-pay | Admitting: *Deleted

## 2021-07-04 ENCOUNTER — Encounter (HOSPITAL_COMMUNITY): Payer: Self-pay

## 2021-07-04 ENCOUNTER — Ambulatory Visit (HOSPITAL_COMMUNITY): Payer: Medicaid Other

## 2021-07-04 DIAGNOSIS — S81801S Unspecified open wound, right lower leg, sequela: Secondary | ICD-10-CM | POA: Diagnosis not present

## 2021-07-04 DIAGNOSIS — S91301S Unspecified open wound, right foot, sequela: Secondary | ICD-10-CM

## 2021-07-04 DIAGNOSIS — R262 Difficulty in walking, not elsewhere classified: Secondary | ICD-10-CM

## 2021-07-04 DIAGNOSIS — R180 Malignant ascites: Secondary | ICD-10-CM

## 2021-07-04 DIAGNOSIS — R600 Localized edema: Secondary | ICD-10-CM

## 2021-07-04 NOTE — Therapy (Signed)
[place], [place], [address] Phone: [phone]   Fax:  [phone]  Wound Care Therapy  Patient Details  Name: Henry Johns MRN: [id number] Date of Birth: 1960-11-26 Referring Provider (PT): Derek Jack   Encounter Date: 07/04/2021   PT End of Session - 07/04/21 1808     Visit Number 3    Number of Visits 18    Date for PT Re-Evaluation 08/09/21    Authorization Type [employer] medicaid Pt will need authorization after visit 13 eval +12 tre    Progress Note Due on Visit 10    PT Start Time 1700    PT Stop Time 1742    PT Time Calculation (min) 42 min    Activity Tolerance Patient tolerated treatment well;Patient limited by pain    Behavior During Therapy [facility] for tasks assessed/performed             Past Medical History:  Diagnosis Date   Colon cancer ([place])    Diabetes mellitus without complication ([place])    Hypertension     Past Surgical History:  Procedure Laterality Date   BIOPSY  01/13/2021   Procedure: BIOPSY;  Surgeon: Harvel Quale, MD;  [facility];  Service: Gastroenterology;;   CATARACT EXTRACTION Right    COLONOSCOPY WITH PROPOFOL N/A 01/13/2021   Procedure: COLONOSCOPY WITH PROPOFOL;  Surgeon: Harvel Quale, MD;  [facility];  Service: Gastroenterology;  Laterality: N/A;  11:05   ESOPHAGOGASTRODUODENOSCOPY (EGD) WITH PROPOFOL N/A 01/13/2021   Procedure: ESOPHAGOGASTRODUODENOSCOPY (EGD) WITH PROPOFOL;  Surgeon: Harvel Quale, MD;  [facility];  Service: Gastroenterology;  Laterality: N/A;   IR IMAGING GUIDED PORT INSERTION  01/24/2021   LIVER BIOPSY  01/10/2021   U/S guided   POLYPECTOMY  01/13/2021   Procedure: POLYPECTOMY;  Surgeon: Harvel Quale, MD;  [facility];  Service: Gastroenterology;;    There were no vitals filed for this visit.    Subjective Assessment - 07/04/21 1758      Subjective Pt stated he doesn't feel well, increased LBP today and feels weak.  Admits to not beginning HEP exercises yet.    Currently in Pain? Yes    Pain Score 4     Pain Location Back    Pain Orientation Lower    Pain Descriptors / Indicators Sore;Aching    Pain Type Chronic pain    Pain Frequency Intermittent                       Wound Therapy - 07/04/21 1758     Subjective Pt stated he doesn't feel well, increased LBP today and feels weak.  Admits to not beginning HEP exercises yet.    Patient and Family Stated Goals wounds to heal    Date of Onset 06/14/21    Prior Treatments ointment, MD dressing    Pain Scale 0-10    Evaluation and Treatment Procedures Explained to Patient/Family Yes    Evaluation and Treatment Procedures agreed to    Wound Properties Date First Assessed: 06/28/21 Time First Assessed: 1545 Wound Type: Other (Comment) Location: Leg Location Orientation: Right;Lateral Wound Description (Comments): inferior lateral wound Present on Admission: Yes   Wound Image Images linked: 1    Dressing Type Impregnated gauze (bismuth);Compression wrap   xeroform, profore lite with extra cotton   Dressing Changed Changed    Dressing Status Dry    Dressing Change  Frequency PRN    Site / Wound Assessment Granulation tissue;Dry    % Wound base Red or Granulating 100%    % Wound base Other/Granulation Tissue (Comment) 0%    Peri-wound Assessment Edema    Drainage Amount None    Treatment Cleansed    Wound Properties Date First Assessed: 06/28/21 Time First Assessed: 1550 Wound Type: Other (Comment) Location: Foot Location Orientation: Right Wound Description (Comments): dorsal wound Present on Admission: Yes   Wound Image Images linked: 1    Dressing Type Compression wrap   Medihoney, 4x4, ABD, profore lite with extra cotton   Dressing Changed Changed    Dressing Status Old drainage    Dressing Change Frequency PRN    Site / Wound Assessment Yellow   green  drainage, redness surround perimeter   % Wound base Red or Granulating 0%    % Wound base Yellow/Fibrinous Exudate 100%    Peri-wound Assessment Edema;Erythema (blanchable)    Drainage Amount Moderate    Drainage Description Serous;Green    Treatment Cleansed;Debridement (Selective)    Wound Properties Date First Assessed: 06/28/21 Time First Assessed: 1530 Wound Type: Other (Comment) , Skin shear/abrasion  Location: Pretibial Location Orientation: Right Wound Description (Comments): most superior of lateral wounds Present on Admission: Yes   Dressing Type Impregnated gauze (bismuth);Compression wrap   xeroform, profore lite with extra cotton   Dressing Changed Changed    Dressing Status Dry    Dressing Change Frequency PRN    Site / Wound Assessment Dry;Granulation tissue    % Wound base Red or Granulating 100%    % Wound base Yellow/Fibrinous Exudate 0%    Peri-wound Assessment Edema    Drainage Amount None    Treatment Cleansed    Selective Debridement - Location wound beds    Selective Debridement - Tools Used Forceps;Scalpel    Selective Debridement - Tissue Removed devitalized tissue    Wound Therapy - Clinical Statement BLE have reduced in edema with dressings sliding down and bottle neck superior of dressings.  Lateral wound 100% following removal of devitalized tissue.  Changed BLE dressings today due to sliding down with additional cotton added for cone shape.  Lt LE will not need to be dressed later this week.  Pt educated on benefits of completing HEP for edema control and strengthening.  Exercises were complete Lt LE during debridement to Rt.  Continued wiht medihoney, additional layers to address drainage and profore lite.    Wound Therapy - Functional Problem List Comorbidity 1;Fitness    Factors Delaying/Impairing Wound Healing Other (comment)   colon cancer with active chemo   Wound Therapy - Frequency 2X / week   6 weeks   Wound Therapy - Current Recommendations Other  (comment)   Other (comment);Compression bandaging;Patient/family education   debridement, compression dressing   Wound Plan Lt LE 1x/week for dressing change.  Continue selective debridment with appropriate dressings Rt dorsal aspect.  Have pt's brother join him next session explaining benefits of compression garments and encourage purchase. F/U on HEP compliance    Dressing  Lateral wounds vaseline and profore compression dressing    Dressing dorsal wound was scored with scapel f/b medihoney 4x4 and profore compression dressing                       PT Short Term Goals - 06/28/21 1644       PT SHORT TERM GOAL #1   Title PT Rt dorsal wound wound to  be 70% granulated    Time 3    Period Weeks    Status New    Target Date 07/19/21      PT SHORT TERM GOAL #2   Title Pt lateral wounds on Rt LE to be healed.    Time 3    Period Weeks    Status New      PT SHORT TERM GOAL #3   Title Edema to be minimal and pt to have been measured for compression garments.    Time 3    Period Weeks    Status New               PT Long Term Goals - 06/28/21 1645       PT LONG TERM GOAL #1   Title Wound on dorsal aspect of Rt foot to be healed    Time 6    Period Weeks    Status New    Target Date 08/09/21                    Patient will benefit from skilled therapeutic intervention in order to improve the following deficits and impairments:     Visit Diagnosis: Multiple open wounds of lower leg, right, sequela  Open wound of foot, right, sequela  Localized edema  Difficulty in walking, not elsewhere classified     Problem List Patient Active Problem List   Diagnosis Date Noted   Colitis 06/01/2021   Hypokalemia 06/01/2021   Hyperlipidemia 06/01/2021   Chronic pain 06/01/2021   Metastasis to liver ([place]) 01/04/2021   Colon cancer metastasized to liver ([place]) 01/04/2021   Other constipation 01/04/2021   Rectal bleeding 01/04/2021   Nausea without  vomiting 01/04/2021   Cancer associated pain 01/04/2021   Diabetes mellitus with neuropathy [facility])    Ihor Austin, LPTA/CLT; CBIS [phone]  Aldona Lento, PTA 07/04/2021, 6:10 PM  [place] [address] [place], [place], [address] Phone: [phone]   Fax:  [phone]  Name: Henry Johns MRN: [id number] Date of Birth: 1960/10/09

## 2021-07-06 ENCOUNTER — Encounter (HOSPITAL_COMMUNITY): Payer: Self-pay

## 2021-07-06 ENCOUNTER — Other Ambulatory Visit (HOSPITAL_COMMUNITY): Payer: Self-pay | Admitting: *Deleted

## 2021-07-06 ENCOUNTER — Other Ambulatory Visit: Payer: Self-pay

## 2021-07-06 ENCOUNTER — Ambulatory Visit (HOSPITAL_COMMUNITY)
Admission: RE | Admit: 2021-07-06 | Discharge: 2021-07-06 | Disposition: A | Discharge status: Home / Self Care | Payer: Medicaid Other | Source: Ambulatory Visit | Attending: Hematology | Admitting: Hematology

## 2021-07-06 DIAGNOSIS — R180 Malignant ascites: Secondary | ICD-10-CM | POA: Insufficient documentation

## 2021-07-06 DIAGNOSIS — C189 Malignant neoplasm of colon, unspecified: Secondary | ICD-10-CM | POA: Diagnosis not present

## 2021-07-06 MED ORDER — SODIUM CHLORIDE FLUSH 0.9 % IV SOLN
INTRAVENOUS | Status: AC
  Administered 2021-07-06: 09:00:00 20 mL
  Filled 2021-07-06: qty 20

## 2021-07-06 MED ORDER — ALBUMIN HUMAN 25 % IV SOLN
INTRAVENOUS | Status: AC
  Administered 2021-07-06: 09:00:00 50 g via INTRAVENOUS
  Filled 2021-07-06: qty 200

## 2021-07-06 MED ORDER — ALBUMIN HUMAN 25 % IV SOLN: 50.0000 g | Freq: Once | INTRAVENOUS | Status: AC

## 2021-07-06 NOTE — Progress Notes (Signed)
PT tolerated right sided paracentesis procedure and 50G of IV albumin well today and 7.2 Liters of ascites removed. PT verbalized understanding of discharge instructions and left via wheelchair with brother with stable vital signs.

## 2021-07-06 NOTE — Procedures (Signed)
INDICATION: Malignant ascites, metastatic colon cancer EXAM: ULTRASOUND GUIDED  PARACENTESIS  MEDICATIONS: None. COMPLICATIONS: None immediate. PROCEDURE: Informed written consent was obtained from the patient after a discussion of the risks, benefits and alternatives to treatment. A timeout was performed prior to the initiation of the procedure.  Initial ultrasound scanning demonstrates a large amount of ascites within the right lower abdominal quadrant. The right lower abdomen was prepped and draped in the usual sterile fashion. 1% lidocaine  was used for local anesthesia.   Following this, a Yueh catheter was introduced. An ultrasound image was saved for documentation purposes. The paracentesis was performed, with frequent monitoring of vital signs.  The catheter was removed and a dressing was applied. The patient tolerated the procedure well without immediate post procedural complication.   FINDINGS: A total of approximately 7.2 L of straw-colored fluid was removed.  IMPRESSION:  Successful ultrasound-guided paracentesis yielding 7.2 liters of peritoneal fluid.

## 2021-07-06 NOTE — Progress Notes (Signed)
Orders placed for paracentesis that patient is needing weekly.  Patient can call and schedule them when he feels he needs it based on his symptoms.

## 2021-07-07 ENCOUNTER — Ambulatory Visit (HOSPITAL_COMMUNITY): Payer: Medicaid Other | Admitting: Physical Therapy

## 2021-07-07 ENCOUNTER — Encounter (HOSPITAL_COMMUNITY): Payer: Self-pay | Admitting: Physical Therapy

## 2021-07-07 DIAGNOSIS — S91301S Unspecified open wound, right foot, sequela: Secondary | ICD-10-CM

## 2021-07-07 DIAGNOSIS — S81801S Unspecified open wound, right lower leg, sequela: Secondary | ICD-10-CM | POA: Diagnosis not present

## 2021-07-07 DIAGNOSIS — R600 Localized edema: Secondary | ICD-10-CM

## 2021-07-07 DIAGNOSIS — R262 Difficulty in walking, not elsewhere classified: Secondary | ICD-10-CM

## 2021-07-07 LAB — BODY FLUID CULTURE W GRAM STAIN

## 2021-07-07 NOTE — Therapy (Signed)
[place], [place], [address] Phone: [phone]   Fax:  [phone]  Wound Care Therapy  Patient Details  Name: Henry Johns MRN: [id number] Date of Birth: August 20, 1960 Referring Provider (PT): Derek Jack   Encounter Date: 07/07/2021   PT End of Session - 07/07/21 1705     Visit Number 4    Number of Visits 18    Date for PT Re-Evaluation 08/09/21    Authorization Type [employer] medicaid Pt will need authorization after visit 13 eval +12 tre    Progress Note Due on Visit 10    PT Start Time 9628    PT Stop Time 1700    PT Time Calculation (min) 45 min    Activity Tolerance Patient tolerated treatment well;Patient limited by pain    Behavior During Therapy [facility] for tasks assessed/performed             Past Medical History:  Diagnosis Date   Colon cancer ([place])    Diabetes mellitus without complication ([place])    Hypertension     Past Surgical History:  Procedure Laterality Date   BIOPSY  01/13/2021   Procedure: BIOPSY;  Surgeon: Harvel Quale, MD;  [facility];  Service: Gastroenterology;;   CATARACT EXTRACTION Right    COLONOSCOPY WITH PROPOFOL N/A 01/13/2021   Procedure: COLONOSCOPY WITH PROPOFOL;  Surgeon: Harvel Quale, MD;  [facility];  Service: Gastroenterology;  Laterality: N/A;  11:05   ESOPHAGOGASTRODUODENOSCOPY (EGD) WITH PROPOFOL N/A 01/13/2021   Procedure: ESOPHAGOGASTRODUODENOSCOPY (EGD) WITH PROPOFOL;  Surgeon: Harvel Quale, MD;  [facility];  Service: Gastroenterology;  Laterality: N/A;   IR IMAGING GUIDED PORT INSERTION  01/24/2021   LIVER BIOPSY  01/10/2021   U/S guided   POLYPECTOMY  01/13/2021   Procedure: POLYPECTOMY;  Surgeon: Harvel Quale, MD;  [facility];  Service: Gastroenterology;;    There were no vitals filed for this visit.               Wound Therapy -  07/07/21 0001     Subjective Pt states that hs is not having any pain in his leg    Patient and Family Stated Goals wounds to heal    Date of Onset 06/14/21    Prior Treatments ointment, MD dressing    Pain Scale 0-10    Pain Score 0-No pain    Evaluation and Treatment Procedures Explained to Patient/Family Yes    Evaluation and Treatment Procedures agreed to    Wound Properties Date First Assessed: 06/28/21 Time First Assessed: 1545 Wound Type: Other (Comment) Location: Leg Location Orientation: Right;Lateral Wound Description (Comments): inferior lateral wound Present on Admission: Yes Final Assessment Date: 07/07/21 Final Assessment Time: 1615   Wound Properties Date First Assessed: 06/28/21 Time First Assessed: 1530 Wound Type: Other (Comment) , Skin shear/abrasion  Location: Pretibial Location Orientation: Right Wound Description (Comments): most superior of lateral wounds Present on Admission: Yes Final Assessment Date: 07/07/21 Final Assessment Time: 1640   Wound Properties Date First Assessed: 06/28/21 Time First Assessed: 1550 Wound Type: Other (Comment) Location: Foot Location Orientation: Right Wound Description (Comments): dorsal wound Present on Admission: Yes   Dressing Type Compression wrap   medihoney   Dressing Changed Changed    Dressing Status New drainage;Old drainage    Dressing Change Frequency PRN    Site / Wound Assessment Granulation tissue;Yellow    % Wound base Red or  Granulating 10%    % Wound base Yellow/Fibrinous Exudate 90%    Peri-wound Assessment Erythema (blanchable)    Drainage Amount Moderate    Drainage Description Green    Treatment Cleansed;Debridement (Selective)    Selective Debridement - Location dorsal wound    Selective Debridement - Tools Used Forceps;Scalpel;Scissors    Selective Debridement - Tissue Removed devitalized tissue    Wound Therapy - Clinical Statement Lt LE no longer has any edema no treatment to this leg. Therapist measured for  compression garment and explained to son to watch to see if he felt edema was returning if it does purchase compression garment, however, there maybe a chance that the edema resolves on it's own since pt does not have a hx of edema in LE>  All wounds except for dorsal foot are healed on the right LE.  Pt has significant green drainage indicative of pseudomonas; pt encouraged to obtain an antibiotic. Noted thinning of eschar on dorsal aspect of Rt foot.    Wound Therapy - Functional Problem List Comorbidity 1;Fitness    Factors Delaying/Impairing Wound Healing Other (comment)   colon cancer with active chemo   Wound Therapy - Frequency 2X / week   6 weeks   Wound Therapy - Current Recommendations Other (comment)   Other (comment);Compression bandaging;Patient/family education   debridement, compression dressing   Wound Plan Continue to treat RT LE only    Dressing  medihoney, 4x4 f/b profore lite.                       PT Short Term Goals - 07/07/21 1706       PT SHORT TERM GOAL #1   Title PT Rt dorsal wound wound to be 70% granulated    Time 3    Period Weeks    Status On-going    Target Date 07/19/21      PT SHORT TERM GOAL #2   Title Pt lateral wounds on Rt LE to be healed.    Time 3    Period Weeks    Status Achieved      PT SHORT TERM GOAL #3   Title Edema to be minimal and pt to have been measured for compression garments.    Time 3    Period Weeks    Status Achieved               PT Long Term Goals - 07/07/21 1706       PT LONG TERM GOAL #1   Title Wound on dorsal aspect of Rt foot to be healed    Status On-going    Target Date 08/09/21                   Plan - 07/07/21 1705     Clinical Impression Statement see above    Personal Factors and Comorbidities Comorbidity 1;Fitness    Comorbidities colon cancer with active chemo    Examination-Activity Limitations Bathing;Locomotion Level;Hygiene/Grooming;Dressing     Examination-Participation Restrictions Community Activity;Shop    Stability/Clinical Decision Making Evolving/Moderate complexity    Rehab Potential Good    PT Frequency 2x / week    PT Duration 6 weeks    PT Treatment/Interventions Other (comment);Compression bandaging;Patient/family education   debridement, compression dressing   PT Next Visit Plan Continue with debridement of wound.    PT Home Exercise Plan keep dressing dry    Consulted and Agree with Plan of Care Patient  Patient will benefit from skilled therapeutic intervention in order to improve the following deficits and impairments:  Decreased skin integrity, Increased edema, Difficulty walking  Visit Diagnosis: Open wound of foot, right, sequela  Localized edema  Difficulty in walking, not elsewhere classified     Problem List Patient Active Problem List   Diagnosis Date Noted   Colitis 06/01/2021   Hypokalemia 06/01/2021   Hyperlipidemia 06/01/2021   Chronic pain 06/01/2021   Metastasis to liver ([place]) 01/04/2021   Colon cancer metastasized to liver ([place]) 01/04/2021   Other constipation 01/04/2021   Rectal bleeding 01/04/2021   Nausea without vomiting 01/04/2021   Cancer associated pain 01/04/2021   Diabetes mellitus with neuropathy [facility])    [name], PT CLT [phone]  07/07/2021, 5:09 PM  [place], [place], [address] Phone: [phone]   Fax:  [phone]  Name: Henry Johns MRN: [id number] Date of Birth: [birth year]

## 2021-07-08 ENCOUNTER — Other Ambulatory Visit (HOSPITAL_COMMUNITY): Payer: Self-pay | Admitting: Hematology

## 2021-07-11 ENCOUNTER — Encounter (HOSPITAL_COMMUNITY): Payer: Self-pay

## 2021-07-11 ENCOUNTER — Other Ambulatory Visit (HOSPITAL_COMMUNITY): Payer: Self-pay | Admitting: *Deleted

## 2021-07-11 ENCOUNTER — Ambulatory Visit (HOSPITAL_COMMUNITY): Payer: Medicaid Other | Attending: Hematology

## 2021-07-11 ENCOUNTER — Other Ambulatory Visit: Payer: Self-pay

## 2021-07-11 ENCOUNTER — Other Ambulatory Visit (HOSPITAL_COMMUNITY): Payer: Self-pay | Admitting: Hematology

## 2021-07-11 ENCOUNTER — Encounter (HOSPITAL_COMMUNITY): Payer: Self-pay | Admitting: Hematology

## 2021-07-11 DIAGNOSIS — L089 Local infection of the skin and subcutaneous tissue, unspecified: Secondary | ICD-10-CM

## 2021-07-11 DIAGNOSIS — S91301S Unspecified open wound, right foot, sequela: Secondary | ICD-10-CM | POA: Insufficient documentation

## 2021-07-11 DIAGNOSIS — S81801S Unspecified open wound, right lower leg, sequela: Secondary | ICD-10-CM | POA: Insufficient documentation

## 2021-07-11 DIAGNOSIS — R600 Localized edema: Secondary | ICD-10-CM | POA: Insufficient documentation

## 2021-07-11 DIAGNOSIS — R262 Difficulty in walking, not elsewhere classified: Secondary | ICD-10-CM | POA: Diagnosis present

## 2021-07-11 MED ORDER — TEMAZEPAM 30 MG PO CAPS: 30.0000 mg | ORAL_CAPSULE | Freq: Every evening | ORAL | 1 refills | Status: DC | PRN

## 2021-07-11 MED ORDER — CIPROFLOXACIN HCL 500 MG PO TABS: 500.0000 mg | ORAL_TABLET | Freq: Two times a day (BID) | ORAL | 0 refills | Status: AC

## 2021-07-11 MED ORDER — CEPHALEXIN 500 MG PO CAPS: 500.0000 mg | ORAL_CAPSULE | Freq: Three times a day (TID) | ORAL | 0 refills | Status: DC

## 2021-07-11 NOTE — Progress Notes (Signed)
Per Caren Griffins at [facility], patients wound on outer aspect of right ankle is exhibiting signs of infection with bright green exudate.  Per Dr Delton Coombes, will send order for culture and start on Cipro 500 mg BID x 7 days imperially until culture results are back. Patient is also had a relapse in insomnia over the past few weeks.  Per Dr Delton Coombes, will increase Restoril to 30 mg. Brother [name] notified of changes, as he is the party who brought the above to my attention.  Advised not to start antibiotic until after the culture is obtained this afternoon.  Verbalized understanding and all questions answered.  Will follow up after cx results ar reported.

## 2021-07-11 NOTE — Therapy (Signed)
[place], [place], [address] Phone: [phone]   Fax:  [phone]  Wound Care Therapy  Patient Details  Name: Henry Johns MRN: [id number] Date of Birth: 04-15-1961 Referring Provider (PT): Derek Jack   Encounter Date: 07/11/2021   PT End of Session - 07/11/21 1845     Visit Number 5    Number of Visits 18    Date for PT Re-Evaluation 08/09/21    Authorization Type [employer] medicaid Pt will need authorization after visit 13 eval +12 tre    Progress Note Due on Visit 10    PT Start Time 1748    PT Stop Time 1828    PT Time Calculation (min) 40 min    Activity Tolerance Patient tolerated treatment well    Behavior During Therapy [facility] for tasks assessed/performed             Past Medical History:  Diagnosis Date   Colon cancer ([place])    Diabetes mellitus without complication ([place])    Hypertension     Past Surgical History:  Procedure Laterality Date   BIOPSY  01/13/2021   Procedure: BIOPSY;  Surgeon: Harvel Quale, MD;  [facility];  Service: Gastroenterology;;   CATARACT EXTRACTION Right    COLONOSCOPY WITH PROPOFOL N/A 01/13/2021   Procedure: COLONOSCOPY WITH PROPOFOL;  Surgeon: Harvel Quale, MD;  [facility];  Service: Gastroenterology;  Laterality: N/A;  11:05   ESOPHAGOGASTRODUODENOSCOPY (EGD) WITH PROPOFOL N/A 01/13/2021   Procedure: ESOPHAGOGASTRODUODENOSCOPY (EGD) WITH PROPOFOL;  Surgeon: Harvel Quale, MD;  [facility];  Service: Gastroenterology;  Laterality: N/A;   IR IMAGING GUIDED PORT INSERTION  01/24/2021   LIVER BIOPSY  01/10/2021   U/S guided   POLYPECTOMY  01/13/2021   Procedure: POLYPECTOMY;  Surgeon: Harvel Quale, MD;  [facility];  Service: Gastroenterology;;    There were no vitals filed for this visit.    Subjective Assessment - 07/11/21 1838     Subjective Pt. stated  he felt weak today.  Discussed need for culture with MD earlier today.    Currently in Pain? No/denies                       Wound Therapy - 07/11/21 0001     Subjective Pt. stated he felt weak today.  Discussed need for culture with MD earlier today.    Patient and Family Stated Goals wounds to heal    Date of Onset 06/14/21    Prior Treatments ointment, MD dressing    Pain Scale 0-10    Pain Score 0-No pain    Evaluation and Treatment Procedures Explained to Patient/Family Yes    Evaluation and Treatment Procedures agreed to    Wound Properties Date First Assessed: 06/28/21 Time First Assessed: 1550 Wound Type: Other (Comment) Location: Foot Location Orientation: Right Wound Description (Comments): dorsal wound Present on Admission: Yes   Wound Image Images linked: 1    Dressing Type Compression wrap   Medihoney with profore lite   Dressing Changed Changed    Dressing Status Old drainage    Dressing Change Frequency PRN    Site / Wound Assessment Yellow;Granulation tissue   Green   % Wound base Red or Granulating 10%    % Wound base Yellow/Fibrinous Exudate 90%    Peri-wound Assessment Erythema (blanchable)    Drainage Amount Moderate    Drainage Description  Green    Treatment Cleansed;Debridement (Selective)    Selective Debridement - Location dorsal wound    Selective Debridement - Tools Used Forceps;Scalpel;Scissors    Selective Debridement - Tissue Removed devitalized tissue    Wound Therapy - Clinical Statement Selective debridement for Rt LE wound bed, able to loosen up perimeter and removal some adherent slough from wound bed.  Continued wiht medihoney and profore lite.    Wound Therapy - Functional Problem List Comorbidity 1;Fitness    Factors Delaying/Impairing Wound Healing Other (comment)   colon cancer with active chemo   Wound Therapy - Frequency 2X / week   6 weeks   Wound Therapy - Current Recommendations Other (comment)   Other (comment);Compression  bandaging;Patient/family education   debridement, compression dressing   Wound Plan Culture next session.  Continue with appropriate dressings    Dressing  medihoney, 4x4 f/b profore lite.                       PT Short Term Goals - 07/07/21 1706       PT SHORT TERM GOAL #1   Title PT Rt dorsal wound wound to be 70% granulated    Time 3    Period Weeks    Status On-going    Target Date 07/19/21      PT SHORT TERM GOAL #2   Title Pt lateral wounds on Rt LE to be healed.    Time 3    Period Weeks    Status Achieved      PT SHORT TERM GOAL #3   Title Edema to be minimal and pt to have been measured for compression garments.    Time 3    Period Weeks    Status Achieved               PT Long Term Goals - 07/07/21 1706       PT LONG TERM GOAL #1   Title Wound on dorsal aspect of Rt foot to be healed    Status On-going    Target Date 08/09/21                    Patient will benefit from skilled therapeutic intervention in order to improve the following deficits and impairments:     Visit Diagnosis: Open wound of foot, right, sequela  Localized edema  Difficulty in walking, not elsewhere classified  Multiple open wounds of lower leg, right, sequela     Problem List Patient Active Problem List   Diagnosis Date Noted   Colitis 06/01/2021   Hypokalemia 06/01/2021   Hyperlipidemia 06/01/2021   Chronic pain 06/01/2021   Metastasis to liver ([place]) 01/04/2021   Colon cancer metastasized to liver ([place]) 01/04/2021   Other constipation 01/04/2021   Rectal bleeding 01/04/2021   Nausea without vomiting 01/04/2021   Cancer associated pain 01/04/2021   Diabetes mellitus with neuropathy [facility])    Ihor Austin, LPTA/CLT; CBIS [phone]  Aldona Lento, PTA 07/11/2021, 6:47 PM  [place], [place], [address] Phone: [phone]   Fax:  [phone]  Name: Henry Johns MRN: [id number] Date of Birth: July 22, 1960

## 2021-07-12 ENCOUNTER — Other Ambulatory Visit (HOSPITAL_COMMUNITY): Payer: Medicaid Other

## 2021-07-12 ENCOUNTER — Ambulatory Visit (HOSPITAL_COMMUNITY): Payer: Medicaid Other | Admitting: Hematology

## 2021-07-12 ENCOUNTER — Ambulatory Visit (HOSPITAL_COMMUNITY): Payer: Medicaid Other

## 2021-07-13 ENCOUNTER — Ambulatory Visit (HOSPITAL_COMMUNITY): Payer: Medicaid Other

## 2021-07-14 ENCOUNTER — Encounter (HOSPITAL_COMMUNITY): Payer: Medicaid Other

## 2021-07-18 ENCOUNTER — Encounter (HOSPITAL_COMMUNITY): Payer: Self-pay

## 2021-07-18 ENCOUNTER — Ambulatory Visit (HOSPITAL_COMMUNITY)
Admission: RE | Admit: 2021-07-18 | Discharge: 2021-07-18 | Disposition: A | Discharge status: Home / Self Care | Payer: Medicaid Other | Source: Ambulatory Visit | Attending: Hematology | Admitting: Hematology

## 2021-07-18 ENCOUNTER — Other Ambulatory Visit (HOSPITAL_COMMUNITY): Payer: Self-pay | Admitting: *Deleted

## 2021-07-18 ENCOUNTER — Other Ambulatory Visit: Payer: Self-pay

## 2021-07-18 ENCOUNTER — Ambulatory Visit (HOSPITAL_COMMUNITY): Payer: Medicaid Other | Admitting: Physical Therapy

## 2021-07-18 DIAGNOSIS — S91301S Unspecified open wound, right foot, sequela: Secondary | ICD-10-CM

## 2021-07-18 DIAGNOSIS — R262 Difficulty in walking, not elsewhere classified: Secondary | ICD-10-CM

## 2021-07-18 DIAGNOSIS — L089 Local infection of the skin and subcutaneous tissue, unspecified: Secondary | ICD-10-CM

## 2021-07-18 DIAGNOSIS — R180 Malignant ascites: Secondary | ICD-10-CM | POA: Insufficient documentation

## 2021-07-18 DIAGNOSIS — S81801S Unspecified open wound, right lower leg, sequela: Secondary | ICD-10-CM

## 2021-07-18 DIAGNOSIS — C189 Malignant neoplasm of colon, unspecified: Secondary | ICD-10-CM | POA: Insufficient documentation

## 2021-07-18 DIAGNOSIS — C787 Secondary malignant neoplasm of liver and intrahepatic bile duct: Secondary | ICD-10-CM | POA: Diagnosis present

## 2021-07-18 DIAGNOSIS — R600 Localized edema: Secondary | ICD-10-CM

## 2021-07-18 MED ORDER — ALBUMIN HUMAN 25 % IV SOLN: 50.0000 g | Freq: Once | INTRAVENOUS | Status: AC

## 2021-07-18 MED ORDER — ALBUMIN HUMAN 25 % IV SOLN
INTRAVENOUS | Status: AC
  Administered 2021-07-18: 50 g via INTRAVENOUS
  Filled 2021-07-18: qty 200

## 2021-07-18 NOTE — Progress Notes (Signed)
PT tolerated right sided paracentesis procedure and 50G of IV albumin well today and 7.4 Liters of clear yellow fluid removed. PT verbalized understanding of discharge instructions and left via wheelchair with NAD.

## 2021-07-18 NOTE — Therapy (Signed)
[place], [place], [address] Phone: [phone]   Fax:  [phone]  Wound Care Therapy  Patient Details  Name: Henry Johns MRN: [id number] Date of Birth: September 30, 1960 Referring Provider (PT): Derek Jack   Encounter Date: 07/18/2021   PT End of Session - 07/18/21 1606     Visit Number 6    Number of Visits 18    Date for PT Re-Evaluation 08/09/21    Authorization Type [employer] medicaid Pt will need authorization after visit 13 eval +12 tre    Progress Note Due on Visit 10    PT Start Time 1320    PT Stop Time 1355    PT Time Calculation (min) 35 min    Activity Tolerance Patient tolerated treatment well    Behavior During Therapy [facility] for tasks assessed/performed             Past Medical History:  Diagnosis Date   Colon cancer ([place])    Diabetes mellitus without complication ([place])    Hypertension     Past Surgical History:  Procedure Laterality Date   BIOPSY  01/13/2021   Procedure: BIOPSY;  Surgeon: Harvel Quale, MD;  [facility];  Service: Gastroenterology;;   CATARACT EXTRACTION Right    COLONOSCOPY WITH PROPOFOL N/A 01/13/2021   Procedure: COLONOSCOPY WITH PROPOFOL;  Surgeon: Harvel Quale, MD;  [facility];  Service: Gastroenterology;  Laterality: N/A;  11:05   ESOPHAGOGASTRODUODENOSCOPY (EGD) WITH PROPOFOL N/A 01/13/2021   Procedure: ESOPHAGOGASTRODUODENOSCOPY (EGD) WITH PROPOFOL;  Surgeon: Harvel Quale, MD;  [facility];  Service: Gastroenterology;  Laterality: N/A;   IR IMAGING GUIDED PORT INSERTION  01/24/2021   LIVER BIOPSY  01/10/2021   U/S guided   POLYPECTOMY  01/13/2021   Procedure: POLYPECTOMY;  Surgeon: Harvel Quale, MD;  [facility];  Service: Gastroenterology;;    There were no vitals filed for this visit.               Wound Therapy - 07/18/21 1557      Subjective Pt just had paracentesis completed this morning.  comes today without dressing on Rt LE and states it had gotten wet and that's why he removed it.    Patient and Family Stated Goals wounds to heal    Date of Onset 06/14/21    Prior Treatments ointment, MD dressing    Pain Scale 0-10    Pain Score 0-No pain    Evaluation and Treatment Procedures Explained to Patient/Family Yes    Evaluation and Treatment Procedures agreed to    Wound Properties Date First Assessed: 06/28/21 Time First Assessed: 1550 Wound Type: Other (Comment) Location: Foot Location Orientation: Right Wound Description (Comments): dorsal wound Present on Admission: Yes   Wound Image Images linked: 1    Dressing Type None    Dressing Changed Changed    Dressing Status Old drainage    Dressing Change Frequency PRN    Site / Wound Assessment Yellow;Pink;Red    % Wound base Red or Granulating 10%    % Wound base Yellow/Fibrinous Exudate 90%    Peri-wound Assessment Erythema (blanchable)    Wound Length (cm) 4.5 cm    Wound Width (cm) 6 cm    Wound Surface Area (cm^2) 27 cm^2    Drainage Amount Minimal    Drainage Description Serosanguineous    Treatment Cleansed;Debridement (Selective)    Selective Debridement - Location dorsal  wound    Selective Debridement - Tools Used Forceps;Scalpel;Scissors    Selective Debridement - Tissue Removed devitalized tissue    Wound Therapy - Clinical Statement Culture completed to Rt dorsal foot wound following proper cleansing.  Educated pt on importance of keeping wound covered at all times and how reducing edema via compression helps with healing of the wound.  LE cleansed well and able to debride adherent eschar from periemter via scapel and forceps.  Measured with reduction noted in length but slight increase in width (measured vertically from furthest points).  No green drainage or slough present today.  Continued with medihoney and profore lite.  instructed to keep dressing in  place and encouraged to wear compression on Lt LE as well due to edema.    Wound Therapy - Functional Problem List Comorbidity 1;Fitness    Factors Delaying/Impairing Wound Healing Other (comment)   colon cancer with active chemo   Wound Therapy - Frequency 2X / week   6 weeks   Wound Therapy - Current Recommendations Other (comment)   Other (comment);Compression bandaging;Patient/family education   debridement, compression dressing   Wound Plan F/U on wound culture. Continue with appropriate dressings    Dressing  medihoney, 4x4 f/b profore lite.                       PT Short Term Goals - 07/07/21 1706       PT SHORT TERM GOAL #1   Title PT Rt dorsal wound wound to be 70% granulated    Time 3    Period Weeks    Status On-going    Target Date 07/19/21      PT SHORT TERM GOAL #2   Title Pt lateral wounds on Rt LE to be healed.    Time 3    Period Weeks    Status Achieved      PT SHORT TERM GOAL #3   Title Edema to be minimal and pt to have been measured for compression garments.    Time 3    Period Weeks    Status Achieved               PT Long Term Goals - 07/07/21 1706       PT LONG TERM GOAL #1   Title Wound on dorsal aspect of Rt foot to be healed    Status On-going    Target Date 08/09/21                    Patient will benefit from skilled therapeutic intervention in order to improve the following deficits and impairments:     Visit Diagnosis: Open wound of foot, right, sequela  Localized edema  Difficulty in walking, not elsewhere classified  Multiple open wounds of lower leg, right, sequela     Problem List Patient Active Problem List   Diagnosis Date Noted   Colitis 06/01/2021   Hypokalemia 06/01/2021   Hyperlipidemia 06/01/2021   Chronic pain 06/01/2021   Metastasis to liver ([place]) 01/04/2021   Colon cancer metastasized to liver ([place]) 01/04/2021   Other constipation 01/04/2021   Rectal bleeding 01/04/2021    Nausea without vomiting 01/04/2021   Cancer associated pain 01/04/2021   Diabetes mellitus with neuropathy [facility])    Teena Irani, PTA/CLT, WTA [phone]  Teena Irani, PTA 07/18/2021, 4:08 PM  [place], [place], [address] Phone: [phone]  Fax:  [phone]  Name: Henry Johns MRN: [id number] Date of Birth: 1961/05/20

## 2021-07-18 NOTE — Procedures (Addendum)
° °  US guided RLQ paracentesis  7.4 L yellow fluid No labs per MD  Tolerated well

## 2021-07-19 ENCOUNTER — Inpatient Hospital Stay (HOSPITAL_BASED_OUTPATIENT_CLINIC_OR_DEPARTMENT_OTHER): Payer: Medicaid Other | Admitting: Hematology

## 2021-07-19 ENCOUNTER — Inpatient Hospital Stay (HOSPITAL_COMMUNITY): Payer: Medicaid Other | Attending: Hematology

## 2021-07-19 VITALS — BP 91/63 | HR 106 | Temp 96.7°F | Resp 17 | Wt 162.4 lb

## 2021-07-19 DIAGNOSIS — C189 Malignant neoplasm of colon, unspecified: Secondary | ICD-10-CM

## 2021-07-19 DIAGNOSIS — C7800 Secondary malignant neoplasm of unspecified lung: Secondary | ICD-10-CM | POA: Insufficient documentation

## 2021-07-19 DIAGNOSIS — C787 Secondary malignant neoplasm of liver and intrahepatic bile duct: Secondary | ICD-10-CM | POA: Insufficient documentation

## 2021-07-19 DIAGNOSIS — C187 Malignant neoplasm of sigmoid colon: Secondary | ICD-10-CM | POA: Diagnosis present

## 2021-07-19 LAB — COMPREHENSIVE METABOLIC PANEL
ALT: 13 U/L (ref 0–44)
AST: 36 U/L (ref 15–41)
Albumin: 2.6 g/dL — ABNORMAL LOW (ref 3.5–5.0)
Alkaline Phosphatase: 204 U/L — ABNORMAL HIGH (ref 38–126)
Anion gap: 6 (ref 5–15)
BUN: 15 mg/dL (ref 6–20)
CO2: 31 mmol/L (ref 22–32)
Calcium: 8.2 mg/dL — ABNORMAL LOW (ref 8.9–10.3)
Chloride: 99 mmol/L (ref 98–111)
Creatinine, Ser: 0.91 mg/dL (ref 0.61–1.24)
GFR, Estimated: >60 mL/min (ref >60)
Glucose, Bld: 89 mg/dL (ref 70–99)
Potassium: 3.4 mmol/L — ABNORMAL LOW (ref 3.5–5.1)
Sodium: 136 mmol/L (ref 135–145)
Total Bilirubin: 1.2 mg/dL (ref 0.3–1.2)
Total Protein: 4.9 g/dL — ABNORMAL LOW (ref 6.5–8.1)

## 2021-07-19 LAB — CBC WITH DIFFERENTIAL/PLATELET
Abs Immature Granulocytes: 0.02 10*3/uL (ref 0.00–0.07)
Basophils Absolute: 0.1 10*3/uL (ref 0.0–0.1)
Basophils Relative: 1 %
Eosinophils Absolute: 0.2 10*3/uL (ref 0.0–0.5)
Eosinophils Relative: 2 %
HCT: 32 % — ABNORMAL LOW (ref 39.0–52.0)
Hemoglobin: 10.8 g/dL — ABNORMAL LOW (ref 13.0–17.0)
Immature Granulocytes: 0 %
Lymphocytes Relative: 27 %
Lymphs Abs: 2.5 10*3/uL (ref 0.7–4.0)
MCH: 31.9 pg (ref 26.0–34.0)
MCHC: 33.8 g/dL (ref 30.0–36.0)
MCV: 94.4 fL (ref 80.0–100.0)
Monocytes Absolute: 0.6 10*3/uL (ref 0.1–1.0)
Monocytes Relative: 7 %
Neutro Abs: 5.8 10*3/uL (ref 1.7–7.7)
Neutrophils Relative %: 63 %
Platelets: 263 10*3/uL (ref 150–400)
RBC: 3.39 MIL/uL — ABNORMAL LOW (ref 4.22–5.81)
RDW: 16.4 % — ABNORMAL HIGH (ref 11.5–15.5)
WBC: 9.2 10*3/uL (ref 4.0–10.5)
nRBC: 0 % (ref 0.0–0.2)

## 2021-07-19 LAB — MAGNESIUM: Magnesium: 1.8 mg/dL (ref 1.7–2.4)

## 2021-07-19 MED ORDER — TAMSULOSIN HCL 0.4 MG PO CAPS: 0.4000 mg | ORAL_CAPSULE | Freq: Every day | ORAL | 3 refills | Status: DC

## 2021-07-19 MED ORDER — TRAZODONE HCL 100 MG PO TABS: 100.0000 mg | ORAL_TABLET | Freq: Every day | ORAL | 2 refills | Status: DC

## 2021-07-19 MED ORDER — SODIUM CHLORIDE 0.9% FLUSH
10.0000 mL | Freq: Once | INTRAVENOUS | Status: AC
  Administered 2021-07-19: 10 mL via INTRAVENOUS

## 2021-07-19 MED ORDER — HEPARIN SOD (PORK) LOCK FLUSH 100 UNIT/ML IV SOLN
500.0000 [IU] | Freq: Once | INTRAVENOUS | Status: AC
  Administered 2021-07-19: 500 [IU] via INTRAVENOUS

## 2021-07-19 MED ORDER — SPIRONOLACTONE 25 MG PO TABS: 25.0000 mg | ORAL_TABLET | Freq: Two times a day (BID) | ORAL | 3 refills | Status: DC

## 2021-07-19 NOTE — Progress Notes (Signed)
Patients port flushed without difficulty.  Good blood return noted with no bruising or swelling noted at site.  Band aid applied.  VSS with discharge and left in satisfactory condition with no s/s of distress noted.   

## 2021-07-19 NOTE — Progress Notes (Signed)
Henry Johns, [place] [address]   CLINIC:  Medical Oncology/Hematology  PCP:  [facility] [place] / [place] [address] [phone]   REASON FOR VISIT:  Follow-up for colon cancer metastasized to liver  PRIOR THERAPY: none  NGS Results: not done  CURRENT THERAPY: FOLFOX every 2 weeks  BRIEF ONCOLOGIC HISTORY:  Oncology History Overview Note  MMR normal Foundation One: low mutation burden   Metastasis to liver ([facility])  01/04/2021 Initial Diagnosis   Metastasis to liver ([facility])   01/25/2021 -  Chemotherapy   Patient is on Treatment Plan : COLORECTAL FOLFOX q14d     Colon cancer metastasized to liver ([facility])  12/26/2020 Imaging   Ct abdomen and pelvis 1. Extensive hepatic metastatic disease and retroperitoneal adenopathy. 2. A 7 mm right lung base nodule, most consistent with metastatic disease. 3. No bowel obstruction. Normal appendix. 4. Aortic Atherosclerosis (ICD10-I70.0).   12/26/2020 Imaging   CT chest 1. Several small lung nodules consistent with metastatic disease. The primary malignancy remains unclear. The liver morphology suggests underlying cirrhosis, and multifocal hepatocellular carcinoma should be included in the differential diagnosis. 2. No acute abnormality in the chest.     01/04/2021 Initial Diagnosis   Carcinoma metastatic to lung [facility])   01/10/2021 Pathology Results   A. LIVER, NEEDLE CORE BIOPSY:  -  Metastatic adenocarcinoma  -  See comment   COMMENT:   The neoplastic cells are positive for cytokeratin 20 and CDX2 but negative for cytokeratin 7, TTF-1, PSA and prostein.  The combined morphology and immunophenotype are consistent with metastasis from a  gastrointestinal primary.  Dr. Saralyn Pilar reviewed the case and agrees with the above diagnosis.     01/13/2021 Procedure   Colonoscopy - Two 3 to 5 mm polyps in the transverse colon, removed with a cold snare. Resected and  retrieved. - Malignant partially obstructing tumor at 17 cm proximal to the anus. Biopsied. Tattooed. - The distal rectum and anal verge are normal on retroflexion view.   01/13/2021 Cancer Staging   Staging form: Colon and Rectum, AJCC 8th Edition - Clinical stage from 01/13/2021: Stage IVB (cTX, cN0, pM1b) - Signed by Heath Lark, MD on 01/13/2021 Stage prefix: Initial diagnosis Total positive nodes: 0    01/13/2021 Procedure   Colonoscopy - Two 3 to 5 mm polyps in the transverse colon, removed with a cold snare. Resected and retrieved. - Malignant partially obstructing tumor at 17 cm proximal to the anus. Biopsied. Tattooed. - The distal rectum and anal verge are normal on retroflexion view.   01/13/2021 Procedure   EGD - Normal esophagus. - Z-line regular, 40 cm from the incisors. - Normal stomach. - Nodular mucosa in   01/13/2021 Pathology Results   A. DUODENUM, NODULE, BIOPSY:  - Peptic duodenitis.  - No dysplasia or malignancy.   B. COLON, SIGMOID, MASS, BIOPSY:  - Invasive adenocarcinoma.   C. COLON, TRANSVERSE, POLYPECTOMY:  - Fragment of ulcerated tissue with adenocarcinoma, see comment.  - Tubular adenoma (X2 fragments).   COMMENT:   B. MMR will be ordered   01/13/2021 Tumor Marker   Patient's tumor was tested for the following markers: CEA. Results of the tumor marker test revealed 883.   01/24/2021 Procedure   Successful right-sided port placement, with the tip of the catheter in the proximal right atrium.   Plan: Catheter ready for use.   See detailed procedure note with images in PACS. The patient tolerated  the procedure well without incident or complication and was returned to Recovery in stable conditi   01/25/2021 -  Chemotherapy   Patient is on Treatment Plan : COLORECTAL FOLFOX q14d     01/25/2021 Tumor Marker   Patient's tumor was tested for the following markers: CEA. Results of the tumor marker test revealed 1378.     CANCER STAGING:  Cancer Staging   Colon cancer metastasized to liver [facility]) Staging form: Colon and Rectum, AJCC 8th Edition - Clinical stage from 01/13/2021: Stage IVB (cTX, cN0, pM1b) - Signed by Artis Delay, MD on 01/13/2021   INTERVAL HISTORY:  Henry Johns, a 61 y.o. male, returns for routine follow-up of his colon cancer metastasized to liver. Gillis was last seen on 06/28/2021.   Today he reports feeling fair. He has lost 32 lbs since 12/21. His right dorsal foot blister is healing. His sleep is poor due to foot pain; this is not helped by Restoril. He is having weekly thoracentesis; he had 7.4 L of fluid removed at his latest thoracentesis. He denies abdominal pain. He reports difficulty urinating since November.    REVIEW OF SYSTEMS:  Review of Systems  Constitutional:  Negative for appetite change and fatigue.  Cardiovascular:  Positive for leg swelling (feet swollen).  Gastrointestinal:  Positive for nausea. Negative for abdominal pain.  Genitourinary:  Positive for difficulty urinating.   Neurological:  Positive for numbness (tingling fingertips).  Psychiatric/Behavioral:  Positive for sleep disturbance.   All other systems reviewed and are negative.  PAST MEDICAL/SURGICAL HISTORY:  Past Medical History:  Diagnosis Date   Colon cancer (HCC)    Diabetes mellitus without complication (HCC)    Hypertension    Past Surgical History:  Procedure Laterality Date   BIOPSY  01/13/2021   Procedure: BIOPSY;  Surgeon: Dolores Frame, MD;  [facility];  Service: Gastroenterology;;   CATARACT EXTRACTION Right    COLONOSCOPY WITH PROPOFOL N/A 01/13/2021   Procedure: COLONOSCOPY WITH PROPOFOL;  Surgeon: Dolores Frame, MD;  Location: [facility];  Service: Gastroenterology;  Laterality: N/A;  11:05   ESOPHAGOGASTRODUODENOSCOPY (EGD) WITH PROPOFOL N/A 01/13/2021   Procedure: ESOPHAGOGASTRODUODENOSCOPY (EGD) WITH PROPOFOL;  Surgeon: Dolores Frame, MD;  Location: [facility];  Service: Gastroenterology;  Laterality: N/A;   IR IMAGING GUIDED PORT INSERTION  01/24/2021   LIVER BIOPSY  01/10/2021   U/S guided   POLYPECTOMY  01/13/2021   Procedure: POLYPECTOMY;  Surgeon: Dolores Frame, MD;  Location: [facility];  Service: Gastroenterology;;    SOCIAL HISTORY:  Social History   Socioeconomic History   Marital status: Married    Spouse name: Not on file   Number of children: Not on file   Years of education: Not on file   Highest education level: Not on file  Occupational History   Not on file  Tobacco Use   Smoking status: Never   Smokeless tobacco: Never  Vaping Use   Vaping Use: Never used  Substance and Sexual Activity   Alcohol use: Not Currently   Drug use: Not Currently   Sexual activity: Not Currently  Other Topics Concern   Not on file  Social History Narrative   Married, no childer   Social Determinants of Health   Financial Resource Strain: High Risk   Difficulty of Paying Living Expenses: Very hard  Food Insecurity: No Food Insecurity   Worried About Running Out of Food in the Last Year: Never true  Ran Out of Food in the Last Year: Never true  Transportation Needs: No Transportation Needs   Lack of Transportation (Medical): No   Lack of Transportation (Non-Medical): No  Physical Activity: Inactive   Days of Exercise per Week: 0 days   Minutes of Exercise per Session: 0 min  Stress: Not on file  Social Connections: Not on file  Intimate Partner Violence: Not At Risk   Fear of Current or Ex-Partner: No   Emotionally Abused: No   Physically Abused: No   Sexually Abused: No    FAMILY HISTORY:  Family History  Problem Relation Age of Onset   Cancer Father 31       prostate ca    CURRENT MEDICATIONS:  Current Outpatient Medications  Medication Sig Dispense Refill   cephALEXin (KEFLEX) 500 MG capsule Take 500 mg by mouth 3 (three) times daily.     fluorouracil CALGB 95638 2,400 mg/m2 in sodium chloride  0.9 % 150 mL Inject 2,400 mg/m2 into the vein over 48 hr.     insulin NPH-regular Human (70-30) 100 UNIT/ML injection Inject 10-12 Units into the skin 2 (two) times daily.     LEUCOVORIN CALCIUM IV Inject 400 mg/m2 into the vein every 14 (fourteen) days.     lidocaine (XYLOCAINE) 5 % ointment Apply 1 application topically in the morning and at bedtime. Apply to both hands twice a day 35.44 g 0   lidocaine-prilocaine (EMLA) cream Apply to affected area once 30 g 3   magnesium oxide (MAG-OX) 400 (240 Mg) MG tablet Take 1 tablet (400 mg total) by mouth 2 (two) times daily. 60 tablet 3   pentoxifylline (TRENTAL) 400 MG CR tablet Take by mouth.     Potassium Chloride ER (K-TAB) 20 MEQ TBCR Take 20 mEq by mouth daily. 30 tablet 3   silver sulfADIAZINE (SILVADENE) 1 % cream Apply 1 application topically daily. 50 g 0   simvastatin (ZOCOR) 20 MG tablet Take 20 mg by mouth every evening.     spironolactone (ALDACTONE) 25 MG tablet Take 1 tablet (25 mg total) by mouth daily. 30 tablet 1   traZODone (DESYREL) 100 MG tablet Take 1 tablet (100 mg total) by mouth at bedtime. 30 tablet 2   acetaminophen (TYLENOL) 500 MG tablet Take 1,000 mg by mouth every 6 (six) hours as needed for moderate pain. (Patient not taking: Reported on 07/19/2021)     furosemide (LASIX) 40 MG tablet Take 1 tablet (40 mg total) by mouth daily as needed. (Patient not taking: Reported on 07/19/2021) 30 tablet 2   Lidocaine, Anorectal, 5 % CREA Apply 1 application topically 2 (two) times daily as needed. (Patient not taking: Reported on 07/19/2021) 30 g 3   LORazepam (ATIVAN) 0.5 MG tablet Take 1 tablet by mouth twice daily as needed for anxiety (Patient not taking: Reported on 07/19/2021) 60 tablet 3   morphine (MSIR) 15 MG tablet Take 1 tablet (15 mg total) by mouth 2 (two) times daily as needed for severe pain. (Patient not taking: Reported on 07/19/2021) 60 tablet 0   ondansetron (ZOFRAN) 8 MG tablet Take 1 tablet (8 mg total) by mouth every  8 (eight) hours as needed for nausea. (Patient not taking: Reported on 07/19/2021) 60 tablet 3   prochlorperazine (COMPAZINE) 10 MG tablet Take 1 tablet by mouth every 6 (six) hours as needed. (Patient not taking: Reported on 07/19/2021)     temazepam (RESTORIL) 30 MG capsule Take 1 capsule (30 mg total) by mouth at  bedtime as needed for sleep. (Patient not taking: Reported on 07/19/2021) 30 capsule 1   No current facility-administered medications for this visit.    ALLERGIES:  No Known Allergies  PHYSICAL EXAM:  Performance status (ECOG): 1 - Symptomatic but completely ambulatory  Vitals:   07/19/21 1211  BP: 91/63  Pulse: (!) 106  Resp: 17  Temp: (!) 96.7 F (35.9 C)  SpO2: 100%   Wt Readings from Last 3 Encounters:  07/19/21 162 lb 6.4 oz (73.7 kg)  06/28/21 194 lb (88 kg)  06/14/21 197 lb 5 oz (89.5 kg)   Physical Exam Vitals reviewed.  Constitutional:      Appearance: Normal appearance.     Comments: In wheelchair  Cardiovascular:     Rate and Rhythm: Normal rate and regular rhythm.     Pulses: Normal pulses.     Heart sounds: Normal heart sounds.  Pulmonary:     Effort: Pulmonary effort is normal.     Breath sounds: Normal breath sounds.  Neurological:     General: No focal deficit present.     Mental Status: He is alert and oriented to person, place, and time.  Psychiatric:        Mood and Affect: Mood normal.        Behavior: Behavior normal.     LABORATORY DATA:  I have reviewed the labs as listed.  CBC Latest Ref Rng & Units 07/19/2021 06/28/2021 06/14/2021  WBC 4.0 - 10.5 K/uL 9.2 7.7 9.2  Hemoglobin 13.0 - 17.0 g/dL 10.8(L) 10.8(L) 10.3(L)  Hematocrit 39.0 - 52.0 % 32.0(L) 32.7(L) 31.1(L)  Platelets 150 - 400 K/uL 263 277 276   CMP Latest Ref Rng & Units 07/19/2021 06/28/2021 06/14/2021  Glucose 70 - 99 mg/dL 89 94 107(H)  BUN 6 - 20 mg/dL $Remove'15 11 9  'ktJUoZl$ Creatinine 0.61 - 1.24 mg/dL 0.91 0.90 0.75  Sodium 135 - 145 mmol/L 136 137 135  Potassium 3.5 - 5.1  mmol/L 3.4(L) 3.1(L) 3.8  Chloride 98 - 111 mmol/L 99 99 104  CO2 22 - 32 mmol/L $RemoveB'31 30 23  'QjjLqbxG$ Calcium 8.9 - 10.3 mg/dL 8.2(L) 7.7(L) 7.9(L)  Total Protein 6.5 - 8.1 g/dL 4.9(L) 4.9(L) 4.7(L)  Total Bilirubin 0.3 - 1.2 mg/dL 1.2 1.1 0.6  Alkaline Phos 38 - 126 U/L 204(H) 249(H) 255(H)  AST 15 - 41 U/L 36 34 31  ALT 0 - 44 U/L $Remo'13 13 12    'RSPXB$ DIAGNOSTIC IMAGING:  I have independently reviewed the scans and discussed with the patient. US Paracentesis  Result Date: 07/18/2021 INDICATION: Recurrent malignant ascites EXAM: ULTRASOUND GUIDED RLQ PARACENTESIS MEDICATIONS: 10 cc 1% lidocaine COMPLICATIONS: None immediate. PROCEDURE: Informed written consent was obtained from the patient after a discussion of the risks, benefits and alternatives to treatment. A timeout was performed prior to the initiation of the procedure. Initial ultrasound scanning demonstrates a large amount of ascites within the right lower abdominal quadrant. The right lower abdomen was prepped and draped in the usual sterile fashion. 1% lidocaine was used for local anesthesia. Following this, a Yueh catheter was introduced. An ultrasound image was saved for documentation purposes. The paracentesis was performed. The catheter was removed and a dressing was applied. The patient tolerated the procedure well without immediate post procedural complication. Patient received post-procedure intravenous albumin; see nursing notes for details. FINDINGS: A total of approximately 7.4 liters of yellow fluid was removed. IMPRESSION: Successful ultrasound-guided paracentesis yielding 7.4 liters of peritoneal fluid. Read by Lavonia Drafts [facility] Electronically  Signed   By: Lavonia Dana M.D.   On: 07/18/2021 10:55   US Paracentesis  Result Date: 07/06/2021 INDICATION: Malignant ascites, metastatic colon cancer EXAM: ULTRASOUND GUIDED  PARACENTESIS MEDICATIONS: None. COMPLICATIONS: None immediate. PROCEDURE: Informed written consent was obtained from the  patient after a discussion of the risks, benefits and alternatives to treatment. A timeout was performed prior to the initiation of the procedure. Initial ultrasound scanning demonstrates a large amount of ascites within the right lower abdominal quadrant. The right lower abdomen was prepped and draped in the usual sterile fashion. 1% lidocaine was used for local anesthesia. Following this, a Yueh catheter was introduced. An ultrasound image was saved for documentation purposes. The paracentesis was performed, with frequent monitoring of vital signs. The catheter was removed and a dressing was applied. The patient tolerated the procedure well without immediate post procedural complication. FINDINGS: A total of approximately 7.2 L of straw-colored fluid was removed. IMPRESSION: Successful ultrasound-guided paracentesis yielding 7.2 liters of peritoneal fluid. Electronically Signed   By: Van Clines M.D.   On: 07/06/2021 15:09   US Paracentesis  Result Date: 06/28/2021 INDICATION: Ascites EXAM: ULTRASOUND GUIDED LLQ PARACENTESIS MEDICATIONS: 10 cc 1% lidocaine. COMPLICATIONS: None immediate. PROCEDURE: Informed written consent was obtained from the patient after a discussion of the risks, benefits and alternatives to treatment. A timeout was performed prior to the initiation of the procedure. Initial ultrasound scanning demonstrates a large amount of ascites within the right lower abdominal quadrant. The right lower abdomen was prepped and draped in the usual sterile fashion. 1% lidocaine was used for local anesthesia. Following this, a Yueh catheter was introduced. An ultrasound image was saved for documentation purposes. The paracentesis was performed. The catheter was removed and a dressing was applied. The patient tolerated the procedure well without immediate post procedural complication. Patient received post-procedure intravenous albumin; see nursing notes for details. FINDINGS: A total of  approximately 4 liters of clear yellow fluid was removed. Samples were sent to the laboratory as requested by the clinical team. IMPRESSION: Successful ultrasound-guided paracentesis yielding 4 liters of peritoneal fluid. Read by Lavonia Drafts [facility] Electronically Signed   By: Lavonia Dana M.D.   On: 06/28/2021 13:00     ASSESSMENT:  1.  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 - CT [facility] with contrast on 12/26/2020 showed extensive hepatic metastatic disease and retroperitoneal adenopathy.  CT chest without contrast on 12/26/2020 showed several small subcentimeter lung nodules consistent with metastatic disease.  Liver morphology suggests underlying cirrhosis. - Liver biopsy on 01/10/2021 consistent with metastatic adenocarcinoma, CK20 and CDX2 positive, negative for CK7, TTF-1, PSA and prostein. - Colonoscopy and biopsy of the sigmoid mass on 01/13/2021 consistent with adenocarcinoma. - 10 pound weight loss prior to start of therapy. - FOLFOX started on 01/25/2021. - Foundation 1 testing on 01/10/2021 showed MSI-stable, TMB low, negative for RAS/BRAF/HER2 mutation.  2.  Social/family history: - Lives at home with his wife.  He drove truck and operated equipment.  No smoking history. - Father had prostate cancer   PLAN:  1.  Metastatic sigmoid colon cancer to liver and lungs: - CT CAP on 04/19/2021 showed interval decrease in size of multiple lung nodules and decrease in size of liver lesions. - CTA PE in the ER end of November showed stable disease. - His last treatment was on 05/22/2021, on hold due to poor tolerance. - His CEA continues to improve, last 16.2  on 06/14/2021. - CEA level is pending from today.  Labs today shows continued improvement in alkaline phosphatase at 204.  CBC was grossly normal. - Ascites cytology was negative from 06/28/2021. - I have recommended a CT CAP with contrast for restaging in 4  weeks.  2.  Coccygeal pain: - Continue half tablet of MS IR at bedtime as needed.  He is not requiring it on a regular basis.  3.  Hypokalemia: - Continue potassium 20 mEq daily.  Potassium is 3.4.  4.  Difficulty falling/staying asleep: - Restoril 30 mg at bedtime is not helping. - We will start him on trazodone 100 mg at bedtime.  Cut it back to 50 mg at bedtime if it is too much.  5.  Ascites/leg swelling: - Abdominal paracentesis on 06/28/2021-4 L - Paracentesis on 07/06/2021-7.2 L - Paracentesis on 07/18/2021-7.4 L removed - He is taking Lasix 40 mg and spironolactone 25 mg daily. - Will increase spironolactone to 25 mg twice daily. - He is scheduled for paracentesis every Tuesday. - He reports some urination difficulty.  Will start him on Flomax 0.4 mg daily.  6.  Hypomagnesemia: - Continue magnesium twice daily.  Magnesium is 1.8.  7.  Anxiety: - Continue Ativan 0.5 mg daily as needed.   Orders placed this encounter:  Orders Placed This Encounter  Procedures   CT CHEST [place], [place] [phone]   I, Thana Ates, am acting as a scribe for Dr. Derek Jack.  I, Derek Jack MD, have reviewed the above documentation for accuracy and completeness, and I agree with the above.

## 2021-07-19 NOTE — Patient Instructions (Signed)
[facility]  Discharge Instructions: Thank you for choosing [facility] to provide your oncology and hematology care.  If you have a lab appointment with the [facility], please come in thru the Main Entrance and check in at the main information desk.  Wear comfortable clothing and clothing appropriate for easy access to any Portacath or PICC line.   We strive to give you quality time with your provider. You may need to reschedule your appointment if you arrive late (15 or more minutes).  Arriving late affects you and other patients whose appointments are after yours.  Also, if you miss three or more appointments without notifying the office, you may be dismissed from the clinic at the providers discretion.      For prescription refill requests, have your pharmacy contact our office and allow 72 hours for refills to be completed.    Today you received the following chemotherapy and/or immunotherapy agents PORT flush with lab draw.      To help prevent nausea and vomiting after your treatment, we encourage you to take your nausea medication as directed.  BELOW ARE SYMPTOMS THAT SHOULD BE REPORTED IMMEDIATELY: *FEVER GREATER THAN 100.4 F (38 C) OR HIGHER *CHILLS OR SWEATING *NAUSEA AND VOMITING THAT IS NOT CONTROLLED WITH YOUR NAUSEA MEDICATION *UNUSUAL SHORTNESS OF BREATH *UNUSUAL BRUISING OR BLEEDING *URINARY PROBLEMS (pain or burning when urinating, or frequent urination) *BOWEL PROBLEMS (unusual diarrhea, constipation, pain near the anus) TENDERNESS IN MOUTH AND THROAT WITH OR WITHOUT PRESENCE OF ULCERS (sore throat, sores in mouth, or a toothache) UNUSUAL RASH, SWELLING OR PAIN  UNUSUAL VAGINAL DISCHARGE OR ITCHING   Items with * indicate a potential emergency and should be followed up as soon as possible or go to the Emergency Department if any problems should occur.  Please show the CHEMOTHERAPY ALERT CARD or IMMUNOTHERAPY ALERT CARD at check-in to the  Emergency Department and triage nurse.  Should you have questions after your visit or need to cancel or reschedule your appointment, please contact [facility] [phone]  and follow the prompts.  Office hours are 8:00 a.m. to 4:30 p.m. Monday - Friday. Please note that voicemails left after 4:00 p.m. may not be returned until the following business day.  We are closed weekends and major holidays. You have access to a nurse at all times for urgent questions. Please call the main number to the clinic [phone] and follow the prompts.  For any non-urgent questions, you may also contact your provider using MyChart. We now offer e-Visits for anyone 25 and older to request care online for non-urgent symptoms. For details visit [email or web address].   Also download the MyChart app! Go to the app store, search "MyChart", open the app, select [facility], and log in with your MyChart username and password.  Due to Covid, a mask is required upon entering the hospital/clinic. If you do not have a mask, one will be given to you upon arrival. For doctor visits, patients may have 1 support person aged 39 or older with them. For treatment visits, patients cannot have anyone with them due to current Covid guidelines and our immunocompromised population.

## 2021-07-19 NOTE — Patient Instructions (Addendum)
[place] at [facility] Discharge Instructions   You were seen and examined today by Dr. Delton Coombes.  He reviewed your lab work, which is normal/stable. Your CEA cancer marker is pending from today.  Your last result was 16.   We will send you a prescription for Trazodone to help you sleep.  STOP taking Restoril while you are taking this medication.   Increase the dose of your Aldactone to 50 mg (2 tablets) daily.  We will repeat a CT scan prior to next visit.  Return as scheduled in 4 weeks.      Thank you for choosing [place] at [facility] to provide your oncology and hematology care.  To afford each patient quality time with our provider, please arrive at least 15 minutes before your scheduled appointment time.   If you have a lab appointment with the [facility] please come in thru the Main Entrance and check in at the main information desk.  You need to re-schedule your appointment should you arrive 10 or more minutes late.  We strive to give you quality time with our providers, and arriving late affects you and other patients whose appointments are after yours.  Also, if you no show three or more times for appointments you may be dismissed from the clinic at the providers discretion.     Again, thank you for choosing [facility].  Our hope is that these requests will decrease the amount of time that you wait before being seen by our physicians.       _____________________________________________________________  Should you have questions after your visit to [facility], please contact our office at [phone] and follow the prompts.  Our office hours are 8:00 a.m. and 4:30 p.m. Monday - Friday.  Please note that voicemails left after 4:00 p.m. may not be returned until the following business day.  We are closed weekends and major holidays.  You do have access to a nurse 24-7, just call the main number  to the clinic [phone] and do not press any options, hold on the line and a nurse will answer the phone.    For prescription refill requests, have your pharmacy contact our office and allow 72 hours.    Due to Covid, you will need to wear a mask upon entering the hospital. If you do not have a mask, a mask will be given to you at the Main Entrance upon arrival. For doctor visits, patients may have 1 support person age 73 or older with them. For treatment visits, patients can not have anyone with them due to social distancing guidelines and our immunocompromised population.

## 2021-07-20 ENCOUNTER — Other Ambulatory Visit: Payer: Self-pay

## 2021-07-20 ENCOUNTER — Ambulatory Visit (HOSPITAL_COMMUNITY): Payer: Medicaid Other | Admitting: Physical Therapy

## 2021-07-20 ENCOUNTER — Encounter (HOSPITAL_COMMUNITY): Payer: Self-pay | Admitting: Physical Therapy

## 2021-07-20 DIAGNOSIS — S91301S Unspecified open wound, right foot, sequela: Secondary | ICD-10-CM | POA: Diagnosis not present

## 2021-07-20 DIAGNOSIS — R600 Localized edema: Secondary | ICD-10-CM

## 2021-07-20 DIAGNOSIS — R262 Difficulty in walking, not elsewhere classified: Secondary | ICD-10-CM

## 2021-07-20 LAB — CEA: CEA: 20.9 ng/mL — ABNORMAL HIGH (ref 0.0–4.7)

## 2021-07-20 NOTE — Therapy (Signed)
[place], [place], [address] Phone: [phone]   Fax:  [phone]  Wound Care Therapy  Patient Details  Name: Henry Johns MRN: [id number] Date of Birth: 1960-08-13 Referring Provider (PT): Derek Jack   Encounter Date: 07/20/2021   PT End of Session - 07/20/21 1450     Visit Number 7    Number of Visits 18    Date for PT Re-Evaluation 08/09/21    Authorization Type [employer] medicaid Pt will need authorization after visit 13 eval +12 tre    Progress Note Due on Visit 10    PT Start Time 6962    PT Stop Time 9528    PT Time Calculation (min) 37 min    Activity Tolerance Patient tolerated treatment well    Behavior During Therapy [facility] for tasks assessed/performed             Past Medical History:  Diagnosis Date   Colon cancer ([place])    Diabetes mellitus without complication ([place])    Hypertension     Past Surgical History:  Procedure Laterality Date   BIOPSY  01/13/2021   Procedure: BIOPSY;  Surgeon: Harvel Quale, MD;  [facility];  Service: Gastroenterology;;   CATARACT EXTRACTION Right    COLONOSCOPY WITH PROPOFOL N/A 01/13/2021   Procedure: COLONOSCOPY WITH PROPOFOL;  Surgeon: Harvel Quale, MD;  [facility];  Service: Gastroenterology;  Laterality: N/A;  11:05   ESOPHAGOGASTRODUODENOSCOPY (EGD) WITH PROPOFOL N/A 01/13/2021   Procedure: ESOPHAGOGASTRODUODENOSCOPY (EGD) WITH PROPOFOL;  Surgeon: Harvel Quale, MD;  [facility];  Service: Gastroenterology;  Laterality: N/A;   IR IMAGING GUIDED PORT INSERTION  01/24/2021   LIVER BIOPSY  01/10/2021   U/S guided   POLYPECTOMY  01/13/2021   Procedure: POLYPECTOMY;  Surgeon: Harvel Quale, MD;  [facility];  Service: Gastroenterology;;    There were no vitals filed for this visit.               Wound Therapy - 07/20/21 0001      Subjective Pt questions why it is taking his wound so long to heal    Patient and Family Stated Goals wounds to heal    Date of Onset 06/14/21    Prior Treatments ointment, MD dressing    Pain Score 0-No pain    Evaluation and Treatment Procedures Explained to Patient/Family Yes    Wound Properties Date First Assessed: 06/28/21 Time First Assessed: 1550 Wound Type: Other (Comment) Location: Foot Location Orientation: Right Wound Description (Comments): dorsal wound Present on Admission: Yes   Dressing Type Compression wrap    Dressing Changed Changed    Dressing Status Old drainage    Dressing Change Frequency PRN    Site / Wound Assessment Pink;Red;Yellow    % Wound base Red or Granulating 15%    % Wound base Yellow/Fibrinous Exudate 85%    Peri-wound Assessment Erythema (blanchable)    Drainage Amount Minimal    Drainage Description Serous    Treatment Debridement (Selective)    Selective Debridement - Location wound bed on dorsal aspect of Lt foot    Selective Debridement - Tools Used Forceps;Scalpel;Scissors    Selective Debridement - Tissue Removed devitalized tissue    Wound Therapy - Clinical Statement Therapist measured LT LE for compression sock and gave measurement to pt explaining that he has some swelling in his LT LE and would benefit from wearing compression  sock on his LT LE> Therapist able to take a layer off the thick eschar, unfortunately this just made the eschar layer thiner, it did not expose any granualation.  Pt wound continues to be able to be debrided well, however eschar is thick and adherent.    Wound Therapy - Functional Problem List Comorbidity 1;Fitness    Factors Delaying/Impairing Wound Healing Other (comment)   colon cancer with active chemo   Wound Therapy - Frequency 2X / week   6 weeks   Wound Therapy - Current Recommendations Other (comment)   Other (comment);Compression bandaging;Patient/family education   debridement, compression dressing   Wound Plan  F/U on wound culture. Continue with appropriate dressings    Dressing  medihoney, 4x4 f/b profore lite.                       PT Short Term Goals - 07/20/21 1451       PT SHORT TERM GOAL #1   Title PT Rt dorsal wound wound to be 70% granulated    Time 3    Period Weeks    Status On-going    Target Date 07/19/21      PT SHORT TERM GOAL #2   Title Pt lateral wounds on Rt LE to be healed.    Time 3    Period Weeks    Status Achieved      PT SHORT TERM GOAL #3   Title Edema to be minimal and pt to have been measured for compression garments.    Time 3    Period Weeks    Status Achieved               PT Long Term Goals - 07/20/21 1451       PT LONG TERM GOAL #1   Title Wound on dorsal aspect of Rt foot to be healed    Status On-going    Target Date 08/09/21                   Plan - 07/20/21 1450     Clinical Impression Statement see above    Personal Factors and Comorbidities Comorbidity 1;Fitness    Comorbidities colon cancer with active chemo    Examination-Activity Limitations Bathing;Locomotion Level;Hygiene/Grooming;Dressing    Examination-Participation Restrictions Community Activity;Shop    Stability/Clinical Decision Making Evolving/Moderate complexity    Rehab Potential Good    PT Frequency 2x / week    PT Duration 6 weeks    PT Treatment/Interventions Other (comment);Compression bandaging;Patient/family education   debridement, compression dressing   PT Next Visit Plan Continue with debridement of wound.    PT Home Exercise Plan keep dressing dry    Consulted and Agree with Plan of Care Patient             Patient will benefit from skilled therapeutic intervention in order to improve the following deficits and impairments:  Decreased skin integrity, Increased edema, Difficulty walking  Visit Diagnosis: Open wound of foot, right, sequela  Localized edema  Difficulty in walking, not elsewhere classified     Problem  List Patient Active Problem List   Diagnosis Date Noted   Colitis 06/01/2021   Hypokalemia 06/01/2021   Hyperlipidemia 06/01/2021   Chronic pain 06/01/2021   Metastasis to liver ([place]) 01/04/2021   Colon cancer metastasized to liver ([place]) 01/04/2021   Other constipation 01/04/2021   Rectal bleeding 01/04/2021   Nausea without vomiting 01/04/2021   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 [place], [place], [address] Phone: [phone]   Fax:  [phone]  Name: JAX KENTNER MRN: [id number] Date of Birth: 1960/08/11

## 2021-07-23 LAB — AEROBIC CULTURE W GRAM STAIN (SUPERFICIAL SPECIMEN)

## 2021-07-25 ENCOUNTER — Ambulatory Visit (HOSPITAL_COMMUNITY): Payer: Medicaid Other

## 2021-07-25 ENCOUNTER — Encounter (HOSPITAL_COMMUNITY): Payer: Self-pay

## 2021-07-25 ENCOUNTER — Ambulatory Visit (HOSPITAL_COMMUNITY)
Admission: RE | Admit: 2021-07-25 | Discharge: 2021-07-25 | Disposition: A | Discharge status: Home / Self Care | Payer: Medicaid Other | Source: Ambulatory Visit | Attending: Hematology | Admitting: Hematology

## 2021-07-25 ENCOUNTER — Other Ambulatory Visit: Payer: Self-pay

## 2021-07-25 DIAGNOSIS — C189 Malignant neoplasm of colon, unspecified: Secondary | ICD-10-CM | POA: Insufficient documentation

## 2021-07-25 DIAGNOSIS — S81801S Unspecified open wound, right lower leg, sequela: Secondary | ICD-10-CM

## 2021-07-25 DIAGNOSIS — R262 Difficulty in walking, not elsewhere classified: Secondary | ICD-10-CM

## 2021-07-25 DIAGNOSIS — C787 Secondary malignant neoplasm of liver and intrahepatic bile duct: Secondary | ICD-10-CM | POA: Insufficient documentation

## 2021-07-25 DIAGNOSIS — R600 Localized edema: Secondary | ICD-10-CM

## 2021-07-25 DIAGNOSIS — S91301S Unspecified open wound, right foot, sequela: Secondary | ICD-10-CM | POA: Diagnosis not present

## 2021-07-25 DIAGNOSIS — R180 Malignant ascites: Secondary | ICD-10-CM | POA: Insufficient documentation

## 2021-07-25 MED ORDER — ALBUMIN HUMAN 25 % IV SOLN
INTRAVENOUS | Status: AC
  Administered 2021-07-25 (×2): 50 g
  Filled 2021-07-25: qty 200

## 2021-07-25 MED ORDER — SODIUM CHLORIDE FLUSH 0.9 % IV SOLN
INTRAVENOUS | Status: AC
  Administered 2021-07-25: 10 mL
  Filled 2021-07-25: qty 10

## 2021-07-25 NOTE — Procedures (Signed)
PreOperative Dx: Malignant ascites Postoperative Dx: Malignant ascites Procedure:   US guided paracentesis Radiologist:  Thornton Papas Anesthesia:  10 ml of1% lidocaine Specimen:  3.7 L of yellow ascitic fluid EBL:   < 1 ml Complications: None

## 2021-07-25 NOTE — Therapy (Signed)
[place], [place], [address] Phone: [phone]   Fax:  [phone]  Wound Care Therapy  Patient Details  Name: Henry Johns MRN: [id number] Date of Birth: [birth year] Referring Provider (PT): Derek Jack   Encounter Date: 07/25/2021   PT End of Session - 07/25/21 1642     Visit Number 8    Number of Visits 18    Date for PT Re-Evaluation 08/09/21    Authorization Type [employer] medicaid Pt will need authorization after visit 13 eval +12 tre    Progress Note Due on Visit 10    PT Start Time 7619    PT Stop Time 1620    PT Time Calculation (min) 45 min    Activity Tolerance Patient tolerated treatment well    Behavior During Therapy [facility] for tasks assessed/performed             Past Medical History:  Diagnosis Date   Colon cancer ([place])    Diabetes mellitus without complication ([place])    Hypertension     Past Surgical History:  Procedure Laterality Date   BIOPSY  01/13/2021   Procedure: BIOPSY;  Surgeon: Harvel Quale, MD;  [facility];  Service: Gastroenterology;;   CATARACT EXTRACTION Right    COLONOSCOPY WITH PROPOFOL N/A 01/13/2021   Procedure: COLONOSCOPY WITH PROPOFOL;  Surgeon: Harvel Quale, MD;  [facility];  Service: Gastroenterology;  Laterality: N/A;  11:05   ESOPHAGOGASTRODUODENOSCOPY (EGD) WITH PROPOFOL N/A 01/13/2021   Procedure: ESOPHAGOGASTRODUODENOSCOPY (EGD) WITH PROPOFOL;  Surgeon: Harvel Quale, MD;  [facility];  Service: Gastroenterology;  Laterality: N/A;   IR IMAGING GUIDED PORT INSERTION  01/24/2021   LIVER BIOPSY  01/10/2021   U/S guided   POLYPECTOMY  01/13/2021   Procedure: POLYPECTOMY;  Surgeon: Harvel Quale, MD;  [facility];  Service: Gastroenterology;;    There were no vitals filed for this visit.    Subjective Assessment - 07/25/21 1634     Subjective Pt stated  he feels a lot better, had a liter of fluids removed earlier today.  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  Wondering if he used appropriate antibiotics.    Currently in Pain? No/denies                       Wound Therapy - 07/25/21 0001     Subjective Pt stated he feels a lot better, had a liter of fluids removed earlier today.  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  Wondering if he used appropriate antibiotics.  Arrived with compression garment on Lt LE.    Patient and Family Stated Goals wounds to heal    Date of Onset 06/14/21    Prior Treatments ointment, MD dressing    Pain Scale 0-10    Pain Score 0-No pain    Evaluation and Treatment Procedures Explained to Patient/Family Yes    Evaluation and Treatment Procedures agreed to    Wound Properties Date First Assessed: 06/28/21 Time First Assessed: 1550 Wound Type: Other (Comment) Location: Foot Location Orientation: Right Wound Description (Comments): dorsal wound Present on Admission: Yes   Wound Image Images linked: 1    Dressing Type Compression wrap   Medihoney, vaseline perimeter, 4x4, profore lite with netting   Dressing Changed Changed    Dressing Status Old drainage    Dressing Change Frequency PRN    Site / Wound  Assessment Pink;Red;Yellow    % Wound base Red or Granulating 15%    % Wound base Yellow/Fibrinous Exudate 85%    Peri-wound Assessment Erythema (blanchable)   1 cm all perimeter except 3:00 at 2.5 cm   Drainage Amount Minimal    Drainage Description Serous    Treatment Cleansed;Debridement (Selective)    Selective Debridement - Location wound bed on dorsal aspect of Lt foot    Selective Debridement - Tools Used Forceps;Scalpel;Scissors    Selective Debridement - Tissue Removed devitalized tissue    Wound Therapy - Clinical Statement Pt encouraged to contact MD if concerned with infection still.  Pt educated on s/s of infection.  No odor or heat present on wound bed, does have erythema perimeter of wound  from 1cm to 2.5 at 3:00.  Therapist able to remove top layer of eschar though unable to reach granulated tissue wiht selective debridment.  Continued with medihoney to assist in removal of eschar and profore lite for edema control.  Reports of comfort at EOS.    Wound Therapy - Functional Problem List Comorbidity 1;Fitness    Factors Delaying/Impairing Wound Healing Other (comment)   colon cancer with active chemo   Wound Therapy - Frequency 2X / week   6 weeks   Wound Therapy - Current Recommendations Other (comment)   Compression bandaging;Patient/family education   debridement, compression dressing   Wound Plan Continue wiht appropriate dressings    Dressing  medihoney, 4x4 f/b profore lite.                       PT Short Term Goals - 07/20/21 1451       PT SHORT TERM GOAL #1   Title PT Rt dorsal wound wound to be 70% granulated    Time 3    Period Weeks    Status On-going    Target Date 07/19/21      PT SHORT TERM GOAL #2   Title Pt lateral wounds on Rt LE to be healed.    Time 3    Period Weeks    Status Achieved      PT SHORT TERM GOAL #3   Title Edema to be minimal and pt to have been measured for compression garments.    Time 3    Period Weeks    Status Achieved               PT Long Term Goals - 07/20/21 1451       PT LONG TERM GOAL #1   Title Wound on dorsal aspect of Rt foot to be healed    Status On-going    Target Date 08/09/21                    Patient will benefit from skilled therapeutic intervention in order to improve the following deficits and impairments:     Visit Diagnosis: Open wound of foot, right, sequela  Localized edema  Difficulty in walking, not elsewhere classified  Multiple open wounds of lower leg, right, sequela     Problem List Patient Active Problem List   Diagnosis Date Noted   Colitis 06/01/2021   Hypokalemia 06/01/2021   Hyperlipidemia 06/01/2021   Chronic pain 06/01/2021   Metastasis  to liver ([place]) 01/04/2021   Colon cancer metastasized to liver ([place]) 01/04/2021   Other constipation 01/04/2021   Rectal bleeding 01/04/2021   Nausea without vomiting 01/04/2021   Cancer associated pain 01/04/2021  Diabetes mellitus with neuropathy [facility])    Ihor Austin, LPTA/CLT; CBIS [phone]  Aldona Lento, PTA 07/25/2021, 4:44 PM  [place] [address] [place], [place], [address] Phone: [phone]   Fax:  [phone]  Name: Henry Johns MRN: [id number] Date of Birth: 1961/03/30

## 2021-07-25 NOTE — Progress Notes (Signed)
PT tolerated left sided paracentesis and 50G of IV albumin well today and 3.7 Liters of clear yellow fluid removed. PT verbalized understanding of discharge instructions and left via wheelchair with wife at this time with no acute distress noted. Dermabond and Band-Aid applied to paracentesis site and clean dry intact at discharge.

## 2021-07-27 ENCOUNTER — Ambulatory Visit (HOSPITAL_COMMUNITY): Payer: Medicaid Other | Admitting: Physical Therapy

## 2021-07-27 ENCOUNTER — Encounter (HOSPITAL_COMMUNITY): Payer: Self-pay | Admitting: Physical Therapy

## 2021-07-27 ENCOUNTER — Other Ambulatory Visit: Payer: Self-pay

## 2021-07-27 DIAGNOSIS — S81801S Unspecified open wound, right lower leg, sequela: Secondary | ICD-10-CM

## 2021-07-27 DIAGNOSIS — R262 Difficulty in walking, not elsewhere classified: Secondary | ICD-10-CM

## 2021-07-27 DIAGNOSIS — S91301S Unspecified open wound, right foot, sequela: Secondary | ICD-10-CM

## 2021-07-27 DIAGNOSIS — R600 Localized edema: Secondary | ICD-10-CM

## 2021-07-27 NOTE — Therapy (Signed)
[place], [place], [address] Phone: [phone]   Fax:  [phone]  Wound Care Therapy  Patient Details  Name: Henry Johns MRN: [id number] Date of Birth: 07/14/1960 Referring Provider (PT): Derek Jack   Encounter Date: 07/27/2021   PT End of Session - 07/27/21 1450     Visit Number 9    Number of Visits 18    Date for PT Re-Evaluation 08/09/21    Authorization Type [employer] medicaid Pt will need authorization after visit 13 eval +12 tre    Progress Note Due on Visit 10    PT Start Time 1400    PT Stop Time 1440    PT Time Calculation (min) 40 min    Activity Tolerance Patient tolerated treatment well    Behavior During Therapy [facility] for tasks assessed/performed             Past Medical History:  Diagnosis Date   Colon cancer ([place])    Diabetes mellitus without complication ([place])    Hypertension     Past Surgical History:  Procedure Laterality Date   BIOPSY  01/13/2021   Procedure: BIOPSY;  Surgeon: Harvel Quale, MD;  [facility];  Service: Gastroenterology;;   CATARACT EXTRACTION Right    COLONOSCOPY WITH PROPOFOL N/A 01/13/2021   Procedure: COLONOSCOPY WITH PROPOFOL;  Surgeon: Harvel Quale, MD;  [facility];  Service: Gastroenterology;  Laterality: N/A;  11:05   ESOPHAGOGASTRODUODENOSCOPY (EGD) WITH PROPOFOL N/A 01/13/2021   Procedure: ESOPHAGOGASTRODUODENOSCOPY (EGD) WITH PROPOFOL;  Surgeon: Harvel Quale, MD;  [facility];  Service: Gastroenterology;  Laterality: N/A;   IR IMAGING GUIDED PORT INSERTION  01/24/2021   LIVER BIOPSY  01/10/2021   U/S guided   POLYPECTOMY  01/13/2021   Procedure: POLYPECTOMY;  Surgeon: Harvel Quale, MD;  [facility];  Service: Gastroenterology;;    There were no vitals filed for this visit.               Wound Therapy - 07/27/21 0001      Subjective Pt continues to be pain free.  Has more energy    Patient and Family Stated Goals wound to heal    Date of Onset 06/14/21    Prior Treatments ointment, MD dressing    Pain Scale 0-10    Pain Score 0-No pain    Evaluation and Treatment Procedures Explained to Patient/Family Yes    Evaluation and Treatment Procedures agreed to    Wound Properties Date First Assessed: 06/28/21 Time First Assessed: 1550 Wound Type: Other (Comment) Location: Foot Location Orientation: Right Wound Description (Comments): dorsal wound Present on Admission: Yes   Dressing Type Compression wrap;Gauze (Comment)   medihoney   Dressing Changed Changed    Dressing Status Old drainage    Dressing Change Frequency PRN    Site / Wound Assessment Pink;Red;Yellow    % Wound base Red or Granulating 30%    % Wound base Yellow/Fibrinous Exudate 70%    Peri-wound Assessment Erythema (blanchable)   including toes   Drainage Amount Minimal    Drainage Description Green;Serous    Treatment Cleansed;Debridement (Selective)    Selective Debridement - Location wound bed    Selective Debridement - Tools Used Forceps;Scalpel;Scissors    Selective Debridement - Tissue Removed eschar and slough    Wound Therapy - Clinical Statement Eschar is continuing to thin exposing more granulation with debridement.  Eschar continues to  be adhernet needing blading    Wound Therapy - Functional Problem List Comorbidity 1;Fitness    Factors Delaying/Impairing Wound Healing Other (comment)   colon cancer with active chemo   Wound Therapy - Frequency 2X / week    Wound Plan Contiue with debridment and compression    Dressing  medihoney, 4x4 f/b profore lite.                       PT Short Term Goals - 07/20/21 1451       PT SHORT TERM GOAL #1   Title PT Rt dorsal wound wound to be 70% granulated    Time 3    Period Weeks    Status On-going    Target Date 07/19/21      PT SHORT TERM GOAL #2   Title Pt lateral wounds  on Rt LE to be healed.    Time 3    Period Weeks    Status Achieved      PT SHORT TERM GOAL #3   Title Edema to be minimal and pt to have been measured for compression garments.    Time 3    Period Weeks    Status Achieved               PT Long Term Goals - 07/20/21 1451       PT LONG TERM GOAL #1   Title Wound on dorsal aspect of Rt foot to be healed    Status On-going    Target Date 08/09/21                   Plan - 07/27/21 1450     Clinical Impression Statement see above    Personal Factors and Comorbidities Comorbidity 1;Fitness    Comorbidities colon cancer with active chemo    Examination-Activity Limitations Bathing;Locomotion Level;Hygiene/Grooming;Dressing    Examination-Participation Restrictions Community Activity;Shop    Stability/Clinical Decision Making Evolving/Moderate complexity    Rehab Potential Good    PT Frequency 2x / week    PT Duration 6 weeks    PT Treatment/Interventions Other (comment);Compression bandaging;Patient/family education   debridement, compression dressing   PT Next Visit Plan Continue with debridement of wound.    PT Home Exercise Plan keep dressing dry    Consulted and Agree with Plan of Care Patient             Patient will benefit from skilled therapeutic intervention in order to improve the following deficits and impairments:  Decreased skin integrity, Increased edema, Difficulty walking  Visit Diagnosis: Open wound of foot, right, sequela  Localized edema  Difficulty in walking, not elsewhere classified  Multiple open wounds of lower leg, right, sequela     Problem List Patient Active Problem List   Diagnosis Date Noted   Colitis 06/01/2021   Hypokalemia 06/01/2021   Hyperlipidemia 06/01/2021   Chronic pain 06/01/2021   Metastasis to liver ([place]) 01/04/2021   Colon cancer metastasized to liver ([place]) 01/04/2021   Other constipation 01/04/2021   Rectal bleeding 01/04/2021   Nausea without  vomiting 01/04/2021   Cancer associated pain 01/04/2021   Diabetes mellitus with neuropathy [facility])    [name], PT CLT [phone]  07/27/2021, 2:51 PM  [place], [place], [address] Phone: [phone]   Fax:  [phone]  Name: Henry Johns MRN: [id number] Date of Birth: 09/10/1960

## 2021-08-01 ENCOUNTER — Other Ambulatory Visit: Payer: Self-pay

## 2021-08-01 ENCOUNTER — Ambulatory Visit (HOSPITAL_COMMUNITY)
Admission: RE | Admit: 2021-08-01 | Discharge: 2021-08-01 | Disposition: A | Discharge status: Home / Self Care | Payer: Medicaid Other | Source: Ambulatory Visit | Attending: Hematology | Admitting: Hematology

## 2021-08-01 ENCOUNTER — Ambulatory Visit (HOSPITAL_COMMUNITY): Payer: Medicaid Other | Admitting: Physical Therapy

## 2021-08-01 DIAGNOSIS — C787 Secondary malignant neoplasm of liver and intrahepatic bile duct: Secondary | ICD-10-CM | POA: Insufficient documentation

## 2021-08-01 DIAGNOSIS — S91301S Unspecified open wound, right foot, sequela: Secondary | ICD-10-CM

## 2021-08-01 DIAGNOSIS — R180 Malignant ascites: Secondary | ICD-10-CM | POA: Insufficient documentation

## 2021-08-01 DIAGNOSIS — R262 Difficulty in walking, not elsewhere classified: Secondary | ICD-10-CM

## 2021-08-01 DIAGNOSIS — C189 Malignant neoplasm of colon, unspecified: Secondary | ICD-10-CM | POA: Insufficient documentation

## 2021-08-01 DIAGNOSIS — S81801S Unspecified open wound, right lower leg, sequela: Secondary | ICD-10-CM

## 2021-08-01 DIAGNOSIS — R600 Localized edema: Secondary | ICD-10-CM

## 2021-08-01 MED ORDER — ALBUMIN HUMAN 25 % IV SOLN
INTRAVENOUS | Status: AC
  Administered 2021-08-01: 11:00:00 50 g
  Filled 2021-08-01: qty 200

## 2021-08-01 NOTE — Therapy (Addendum)
[place], [place], [address] Phone: [phone]   Fax:  [phone]  Wound Care Therapy  Patient Details  Name: Henry Johns MRN: [id number] Date of Birth: Feb 13, 1961 Referring Provider (PT): Derek Jack  Progress Note Reporting Period 06/28/2021 to 08/01/2021  See note below for Objective Data and Assessment of Progress/Goals.      Encounter Date: 08/01/2021   PT End of Session - 08/01/21 1612     Visit Number 10    Number of Visits 18    Date for PT Re-Evaluation 08/09/21    Authorization Type [employer] medicaid Pt will need authorization after visit 13 eval +12 tre    Progress Note Due on Visit 20    PT Start Time 1455    PT Stop Time 1534    PT Time Calculation (min) 39 min    Activity Tolerance Patient tolerated treatment well    Behavior During Therapy WFL for tasks assessed/performed             Past Medical History:  Diagnosis Date   Colon cancer ([place])    Diabetes mellitus without complication ([place])    Hypertension     Past Surgical History:  Procedure Laterality Date   BIOPSY  01/13/2021   Procedure: BIOPSY;  Surgeon: Harvel Quale, MD;  [facility];  Service: Gastroenterology;;   CATARACT EXTRACTION Right    COLONOSCOPY WITH PROPOFOL N/A 01/13/2021   Procedure: COLONOSCOPY WITH PROPOFOL;  Surgeon: Harvel Quale, MD;  [facility];  Service: Gastroenterology;  Laterality: N/A;  11:05   ESOPHAGOGASTRODUODENOSCOPY (EGD) WITH PROPOFOL N/A 01/13/2021   Procedure: ESOPHAGOGASTRODUODENOSCOPY (EGD) WITH PROPOFOL;  Surgeon: Harvel Quale, MD;  [facility];  Service: Gastroenterology;  Laterality: N/A;   IR IMAGING GUIDED PORT INSERTION  01/24/2021   LIVER BIOPSY  01/10/2021   U/S guided   POLYPECTOMY  01/13/2021   Procedure: POLYPECTOMY;  Surgeon: Harvel Quale, MD;  [facility];  Service:  Gastroenterology;;    There were no vitals filed for this visit.               Wound Therapy - 08/01/21 1605     Subjective states he is going now 2X week to get the fluid removed from his stomach    Patient and Family Stated Goals wound to heal    Date of Onset 06/14/21    Prior Treatments ointment, MD dressing    Pain Scale 0-10    Pain Score 0-No pain    Evaluation and Treatment Procedures Explained to Patient/Family Yes    Evaluation and Treatment Procedures agreed to    Wound Properties Date First Assessed: 06/28/21 Time First Assessed: 1550 Wound Type: Other (Comment) Location: Foot Location Orientation: Right Wound Description (Comments): dorsal wound Present on Admission: Yes   Wound Image Images linked: 1    Dressing Type Compression wrap   medihoney   Dressing Changed Changed    Dressing Status Old drainage    Dressing Change Frequency PRN    Site / Wound Assessment Pink;Red;Yellow    % Wound base Red or Granulating 30%   was 0% on 12/21   % Wound base Yellow/Fibrinous Exudate 70%    Peri-wound Assessment Erythema (blanchable)    Wound Length (cm) 4.5 cm   was 4.8cm on eval 12/21   Wound Width (cm) 5 cm   was 5 cm on 12/21   Wound Surface  Area (cm^2) 22.5 cm^2    Margins Attached edges (approximated)    Drainage Amount Moderate    Drainage Description Purulent;Green   pseudomonas   Treatment Cleansed;Debridement (Selective)    Selective Debridement - Location wound bed    Selective Debridement - Tools Used Forceps;Scalpel;Scissors    Selective Debridement - Tissue Removed eschar and slough    Wound Therapy - Clinical Statement Dressing had slid down a little.  Noted increased pseudomonas drainage with sweet odor; instructed to contact MD to resume antibiotics.  Little slough removed this session due to being adherent.  Slowly approximating on borders but has increased in granulation overall from initial evaluation.  Extra cotton used around ankle to prevent  slippage.  Pt will continue to benefit from skilled woundcare.    Wound Therapy - Functional Problem List Comorbidity 1;Fitness    Factors Delaying/Impairing Wound Healing Other (comment)   colon cancer with active chemo   Wound Therapy - Frequency 2X / week    Wound Plan Contiue with debridment and compression.  measure weekly and f/u if contacted MD    Dressing  medihoney, 4x4 f/b profore lite.                       PT Short Term Goals - 07/20/21 1451       PT SHORT TERM GOAL #1   Title PT Rt dorsal wound wound to be 70% granulated    Time 3    Period Weeks    Status On-going    Target Date 07/19/21      PT SHORT TERM GOAL #2   Title Pt lateral wounds on Rt LE to be healed.    Time 3    Period Weeks    Status Achieved      PT SHORT TERM GOAL #3   Title Edema to be minimal and pt to have been measured for compression garments.    Time 3    Period Weeks    Status Achieved               PT Long Term Goals - 07/20/21 1451       PT LONG TERM GOAL #1   Title Wound on dorsal aspect of Rt foot to be healed    Status On-going    Target Date 08/09/21                    Patient will benefit from skilled therapeutic intervention in order to improve the following deficits and impairments:     Visit Diagnosis: Open wound of foot, right, sequela  Localized edema  Difficulty in walking, not elsewhere classified  Multiple open wounds of lower leg, right, sequela     Problem List Patient Active Problem List   Diagnosis Date Noted   Colitis 06/01/2021   Hypokalemia 06/01/2021   Hyperlipidemia 06/01/2021   Chronic pain 06/01/2021   Metastasis to liver ([place]) 01/04/2021   Colon cancer metastasized to liver ([place]) 01/04/2021   Other constipation 01/04/2021   Rectal bleeding 01/04/2021   Nausea without vomiting 01/04/2021   Cancer associated pain 01/04/2021   Diabetes mellitus with neuropathy [facility])    Teena Irani, PTA/CLT,  WTA [place], PT CLT [phone] 08/01/2021, 4:13 PM  [place], [place], [address] Phone: [phone]   Fax:  [phone]  Name: Henry Johns MRN: [id number] Date of Birth: 10/17/1960

## 2021-08-01 NOTE — Procedures (Signed)
PreOperative Dx: Malignant ascites Postoperative Dx: Malignant ascites Procedure:   US guided paracentesis Radiologist:  Thornton Papas Anesthesia:  10 ml of1% lidocaine Specimen:  7.2 L of yellow ascitic fluid EBL:   < 1 ml Complications: None

## 2021-08-01 NOTE — Progress Notes (Signed)
PT tolerated RT sided paracentesis and 50G of IV albumin well. 7.2 Liters of clear yellow fluid removed. PT verbalized understanding of discharge instructions and left via wheelchair with brother, no acute distress observed.

## 2021-08-02 ENCOUNTER — Other Ambulatory Visit (HOSPITAL_COMMUNITY): Payer: Self-pay | Admitting: *Deleted

## 2021-08-02 MED ORDER — CIPROFLOXACIN HCL 500 MG PO TABS: 500.0000 mg | ORAL_TABLET | Freq: Two times a day (BID) | ORAL | 0 refills | Status: AC

## 2021-08-02 NOTE — Telephone Encounter (Signed)
Per therapy, there looks to be infection remaining in foot wound. Patient took a round of keflex and was not helpful.  Dr Delton Coombes reviewed culture results and ordered Cipro 500 mg bid x 14 days.  Patient aware and will start today.

## 2021-08-03 ENCOUNTER — Ambulatory Visit (HOSPITAL_COMMUNITY): Payer: Medicaid Other | Admitting: Physical Therapy

## 2021-08-03 ENCOUNTER — Other Ambulatory Visit: Payer: Self-pay

## 2021-08-03 DIAGNOSIS — S91301S Unspecified open wound, right foot, sequela: Secondary | ICD-10-CM | POA: Diagnosis not present

## 2021-08-03 DIAGNOSIS — R600 Localized edema: Secondary | ICD-10-CM

## 2021-08-03 NOTE — Therapy (Signed)
[place], [place], [address] Phone: [phone]   Fax:  [phone]  Wound Care Therapy  Patient Details  Name: Henry Johns MRN: [id number] Date of Birth: [birth year] Referring Provider (PT): Derek Jack   Encounter Date: 08/03/2021   PT End of Session - 08/03/21 1709     Visit Number 11    Number of Visits 18    Date for PT Re-Evaluation 08/09/21    Authorization Type [employer] medicaid Pt will need authorization after visit 13 eval +12 tre    Progress Note Due on Visit 20    PT Start Time 1615    PT Stop Time 1650    PT Time Calculation (min) 35 min    Activity Tolerance Patient tolerated treatment well    Behavior During Therapy [facility] for tasks assessed/performed             Past Medical History:  Diagnosis Date   Colon cancer ([place])    Diabetes mellitus without complication ([place])    Hypertension     Past Surgical History:  Procedure Laterality Date   BIOPSY  01/13/2021   Procedure: BIOPSY;  Surgeon: Harvel Quale, MD;  [facility];  Service: Gastroenterology;;   CATARACT EXTRACTION Right    COLONOSCOPY WITH PROPOFOL N/A 01/13/2021   Procedure: COLONOSCOPY WITH PROPOFOL;  Surgeon: Harvel Quale, MD;  [facility];  Service: Gastroenterology;  Laterality: N/A;  11:05   ESOPHAGOGASTRODUODENOSCOPY (EGD) WITH PROPOFOL N/A 01/13/2021   Procedure: ESOPHAGOGASTRODUODENOSCOPY (EGD) WITH PROPOFOL;  Surgeon: Harvel Quale, MD;  [facility];  Service: Gastroenterology;  Laterality: N/A;   IR IMAGING GUIDED PORT INSERTION  01/24/2021   LIVER BIOPSY  01/10/2021   U/S guided   POLYPECTOMY  01/13/2021   Procedure: POLYPECTOMY;  Surgeon: Harvel Quale, MD;  [facility];  Service: Gastroenterology;;    There were no vitals filed for this visit.               Wound Therapy - 08/03/21 0001      Subjective Pt states he fell Monday and hit his knee, there is a new open area near his knee.  Pt just started back on antibiotics.    Patient and Family Stated Goals wound to heal    Date of Onset 06/14/21    Prior Treatments ointment, MD dressing    Pain Score 0-No pain    Evaluation and Treatment Procedures Explained to Patient/Family Yes    Evaluation and Treatment Procedures agreed to    Wound Properties Date First Assessed: 06/28/21 Time First Assessed: 1550 Wound Type: Other (Comment) Location: Foot Location Orientation: Right Wound Description (Comments): dorsal wound Present on Admission: Yes   Dressing Type Abdominal pads;Compression wrap;Impregnated gauze (bismuth)    Dressing Changed Changed    Dressing Status Old drainage    Dressing Change Frequency PRN    Site / Wound Assessment Red;Yellow;Pink    % Wound base Red or Granulating 50%    % Wound base Yellow/Fibrinous Exudate 50%    Wound Length (cm) 4.5 cm    Wound Width (cm) 4.6 cm    Wound Surface Area (cm^2) 20.7 cm^2    Margins Attached edges (approximated)    Drainage Amount Moderate    Drainage Description Green;Purulent    Treatment Cleansed;Debridement (Selective)    Selective Debridement - Location wound bed    Selective Debridement - Tools Used Forceps;Scalpel;Scissors  Selective Debridement - Tissue Removed eschar and slough    Wound Therapy - Clinical Statement There are two small new wounds on the lateral aspect of pt Rt knee the first 1.4x 1.2  the second 1.1 x .5 therapist assumes these will be healed by next treatment.  Dressed with xeroform followed by compression, dorsal wound dressing changed to silver alginate due to increased, purulent drainage    Wound Therapy - Functional Problem List Comorbidity 1;Fitness    Factors Delaying/Impairing Wound Healing Other (comment)   colon cancer with active chemo   Wound Therapy - Frequency 2X / week    Wound Plan Contiue with debridment and compression.  measure  weekly and f/u if contacted MD    Dressing  xeroform to new wounds; silver hydrofiber to dorsal wound followed by profore lite                       PT Short Term Goals - 07/20/21 1451       PT SHORT TERM GOAL #1   Title PT Rt dorsal wound wound to be 70% granulated    Time 3    Period Weeks    Status On-going    Target Date 07/19/21      PT SHORT TERM GOAL #2   Title Pt lateral wounds on Rt LE to be healed.    Time 3    Period Weeks    Status Achieved      PT SHORT TERM GOAL #3   Title Edema to be minimal and pt to have been measured for compression garments.    Time 3    Period Weeks    Status Achieved               PT Long Term Goals - 07/20/21 1451       PT LONG TERM GOAL #1   Title Wound on dorsal aspect of Rt foot to be healed    Status On-going    Target Date 08/09/21                   Plan - 08/03/21 1710     Clinical Impression Statement see above    Personal Factors and Comorbidities Comorbidity 1;Fitness    Comorbidities colon cancer with active chemo    Examination-Activity Limitations Bathing;Locomotion Level;Hygiene/Grooming;Dressing    Examination-Participation Restrictions Community Activity;Shop    Stability/Clinical Decision Making Evolving/Moderate complexity    Rehab Potential Good    PT Frequency 2x / week    PT Duration 6 weeks    PT Treatment/Interventions Other (comment);Compression bandaging;Patient/family education   debridement, compression dressing   PT Next Visit Plan Continue with debridement of wound.    PT Home Exercise Plan keep dressing dry    Consulted and Agree with Plan of Care Patient             Patient will benefit from skilled therapeutic intervention in order to improve the following deficits and impairments:  Decreased skin integrity, Increased edema, Difficulty walking  Visit Diagnosis: Open wound of foot, right, sequela  Localized edema     Problem List Patient Active  Problem List   Diagnosis Date Noted   Colitis 06/01/2021   Hypokalemia 06/01/2021   Hyperlipidemia 06/01/2021   Chronic pain 06/01/2021   Metastasis to liver ([place]) 01/04/2021   Colon cancer metastasized to liver ([place]) 01/04/2021   Other constipation 01/04/2021   Rectal bleeding 01/04/2021   Nausea without vomiting 01/04/2021  Cancer associated pain 01/04/2021   Diabetes mellitus with neuropathy [facility])   [name], PT CLT [phone]  08/03/2021, 5:11 PM  [place] [address] [place], [place], [address] Phone: [phone]   Fax:  [phone]  Name: Henry Johns MRN: [id number] Date of Birth: [birth year]

## 2021-08-08 ENCOUNTER — Encounter (HOSPITAL_COMMUNITY): Payer: Self-pay

## 2021-08-08 ENCOUNTER — Other Ambulatory Visit: Payer: Self-pay

## 2021-08-08 ENCOUNTER — Ambulatory Visit (HOSPITAL_COMMUNITY)
Admission: RE | Admit: 2021-08-08 | Discharge: 2021-08-08 | Disposition: A | Discharge status: Home / Self Care | Payer: Medicaid Other | Source: Ambulatory Visit | Attending: Hematology | Admitting: Hematology

## 2021-08-08 ENCOUNTER — Ambulatory Visit (HOSPITAL_COMMUNITY): Payer: Medicaid Other | Admitting: Physical Therapy

## 2021-08-08 DIAGNOSIS — C189 Malignant neoplasm of colon, unspecified: Secondary | ICD-10-CM | POA: Insufficient documentation

## 2021-08-08 DIAGNOSIS — R262 Difficulty in walking, not elsewhere classified: Secondary | ICD-10-CM

## 2021-08-08 DIAGNOSIS — R180 Malignant ascites: Secondary | ICD-10-CM | POA: Diagnosis present

## 2021-08-08 DIAGNOSIS — S81801S Unspecified open wound, right lower leg, sequela: Secondary | ICD-10-CM

## 2021-08-08 DIAGNOSIS — R600 Localized edema: Secondary | ICD-10-CM

## 2021-08-08 DIAGNOSIS — C787 Secondary malignant neoplasm of liver and intrahepatic bile duct: Secondary | ICD-10-CM | POA: Insufficient documentation

## 2021-08-08 DIAGNOSIS — S91301S Unspecified open wound, right foot, sequela: Secondary | ICD-10-CM

## 2021-08-08 MED ORDER — ALBUMIN HUMAN 25 % IV SOLN: 50.0000 g | Freq: Once | INTRAVENOUS | Status: AC

## 2021-08-08 MED ORDER — ALBUMIN HUMAN 25 % IV SOLN
INTRAVENOUS | Status: AC
  Administered 2021-08-08: 50 g via INTRAVENOUS
  Filled 2021-08-08: qty 200

## 2021-08-08 NOTE — Progress Notes (Signed)
Pt tolerated left sided paracentesis procedure and 50G of IV albumin well today and 6 Liters of clear yellow ascites removed. PT verbalized understanding of discharge instructions and left with no acute distress noted.

## 2021-08-08 NOTE — Therapy (Signed)
[place], [place], [address] Phone: [phone]   Fax:  [phone]  Wound Care Therapy  Patient Details  Name: Henry Johns MRN: [id number] Date of Birth: 28-Mar-1961 Referring Provider (PT): Derek Jack   Encounter Date: 08/08/2021   PT End of Session - 08/08/21 1506     Visit Number 12    Number of Visits 18    Date for PT Re-Evaluation 08/09/21    Authorization Type [employer] medicaid Pt will need authorization after visit 13 eval +12 tre    Progress Note Due on Visit 20    PT Start Time 0347    PT Stop Time 4259    PT Time Calculation (min) 43 min    Activity Tolerance Patient tolerated treatment well    Behavior During Therapy [facility] for tasks assessed/performed             Past Medical History:  Diagnosis Date   Colon cancer ([place])    Diabetes mellitus without complication ([place])    Hypertension     Past Surgical History:  Procedure Laterality Date   BIOPSY  01/13/2021   Procedure: BIOPSY;  Surgeon: Harvel Quale, MD;  [facility];  Service: Gastroenterology;;   CATARACT EXTRACTION Right    COLONOSCOPY WITH PROPOFOL N/A 01/13/2021   Procedure: COLONOSCOPY WITH PROPOFOL;  Surgeon: Harvel Quale, MD;  [facility];  Service: Gastroenterology;  Laterality: N/A;  11:05   ESOPHAGOGASTRODUODENOSCOPY (EGD) WITH PROPOFOL N/A 01/13/2021   Procedure: ESOPHAGOGASTRODUODENOSCOPY (EGD) WITH PROPOFOL;  Surgeon: Harvel Quale, MD;  [facility];  Service: Gastroenterology;  Laterality: N/A;   IR IMAGING GUIDED PORT INSERTION  01/24/2021   LIVER BIOPSY  01/10/2021   U/S guided   POLYPECTOMY  01/13/2021   Procedure: POLYPECTOMY;  Surgeon: Harvel Quale, MD;  [facility];  Service: Gastroenterology;;    There were no vitals filed for this visit.               Wound Therapy - 08/08/21 1454      Subjective Pt states he fell Monday and hit his knee, there is a new open area near his knee.  Pt just started back on antibiotics.    Patient and Family Stated Goals wound to heal    Date of Onset 06/14/21    Prior Treatments ointment, MD dressing    Evaluation and Treatment Procedures Explained to Patient/Family Yes    Evaluation and Treatment Procedures agreed to    Wound Properties Date First Assessed: 06/28/21 Time First Assessed: 1550 Wound Type: Other (Comment) Location: Foot Location Orientation: Right Wound Description (Comments): dorsal wound Present on Admission: Yes   Dressing Type Abdominal pads;Compression wrap;Silver dressings    Dressing Changed Changed    Dressing Status Old drainage    Dressing Change Frequency PRN    Site / Wound Assessment Red;Yellow;Pink    % Wound base Red or Granulating 50%    % Wound base Yellow/Fibrinous Exudate 50%    Margins Attached edges (approximated)    Drainage Amount Moderate    Drainage Description Serosanguineous;Purulent;No odor    Treatment Cleansed;Debridement (Selective)    Selective Debridement - Location wound bed    Selective Debridement - Tools Used Forceps;Scalpel;Scissors    Selective Debridement - Tissue Removed eschar and slough    Wound Therapy - Clinical Statement wound continues to have moderate drainage,  Wound is approximating and heaing with decreasing adherent  slough.  wound up at knee uncovered and dry.  Clenased this one well and covered with xerform and medipore tape.  Continued with silver hydrofiber with addition of 1/4 ABD pad over prior to application of profore lite.  PT reported overall comfort.  Recommended pt use elastic shoestrings in his shoes as they are unsafe currently and has created a bruise beneath his Rt great toenail.  Pt verbalized understanding.    Wound Therapy - Functional Problem List Comorbidity 1;Fitness    Factors Delaying/Impairing Wound Healing Other (comment)   colon cancer with active chemo    Wound Therapy - Frequency 2X / week    Wound Plan Contiue with debridment and compression.  Complete recert next session.    Dressing  xeroform to wound lateral knee; silver hydrofiber to dorsal wound followed by ABD and profore lite                       PT Short Term Goals - 07/20/21 1451       PT SHORT TERM GOAL #1   Title PT Rt dorsal wound wound to be 70% granulated    Time 3    Period Weeks    Status On-going    Target Date 07/19/21      PT SHORT TERM GOAL #2   Title Pt lateral wounds on Rt LE to be healed.    Time 3    Period Weeks    Status Achieved      PT SHORT TERM GOAL #3   Title Edema to be minimal and pt to have been measured for compression garments.    Time 3    Period Weeks    Status Achieved               PT Long Term Goals - 07/20/21 1451       PT LONG TERM GOAL #1   Title Wound on dorsal aspect of Rt foot to be healed    Status On-going    Target Date 08/09/21                    Patient will benefit from skilled therapeutic intervention in order to improve the following deficits and impairments:     Visit Diagnosis: Open wound of foot, right, sequela  Difficulty in walking, not elsewhere classified  Multiple open wounds of lower leg, right, sequela  Localized edema     Problem List Patient Active Problem List   Diagnosis Date Noted   Colitis 06/01/2021   Hypokalemia 06/01/2021   Hyperlipidemia 06/01/2021   Chronic pain 06/01/2021   Metastasis to liver ([place]) 01/04/2021   Colon cancer metastasized to liver ([place]) 01/04/2021   Other constipation 01/04/2021   Rectal bleeding 01/04/2021   Nausea without vomiting 01/04/2021   Cancer associated pain 01/04/2021   Diabetes mellitus with neuropathy [facility])    Teena Irani, PTA/CLT, WTA [phone]  Teena Irani, PTA 08/08/2021, 3:07 PM  [place] [address] [place], [place], [address] Phone:  [phone]   Fax:  [phone]  Name: Henry Johns MRN: [id number] Date of Birth: [birth year]

## 2021-08-10 ENCOUNTER — Encounter (HOSPITAL_COMMUNITY): Payer: Self-pay | Admitting: Physical Therapy

## 2021-08-10 ENCOUNTER — Other Ambulatory Visit: Payer: Self-pay

## 2021-08-10 ENCOUNTER — Ambulatory Visit (HOSPITAL_COMMUNITY): Payer: Medicaid Other | Attending: Hematology | Admitting: Physical Therapy

## 2021-08-10 DIAGNOSIS — S91301S Unspecified open wound, right foot, sequela: Secondary | ICD-10-CM | POA: Insufficient documentation

## 2021-08-10 DIAGNOSIS — S81801S Unspecified open wound, right lower leg, sequela: Secondary | ICD-10-CM | POA: Insufficient documentation

## 2021-08-10 DIAGNOSIS — R262 Difficulty in walking, not elsewhere classified: Secondary | ICD-10-CM | POA: Insufficient documentation

## 2021-08-10 DIAGNOSIS — R600 Localized edema: Secondary | ICD-10-CM | POA: Insufficient documentation

## 2021-08-10 NOTE — Therapy (Signed)
[place], [place], [address] Phone: [phone]   Fax:  [phone]  Wound Care Therapy  Patient Details  Name: Henry Johns MRN: [id number] Date of Birth: December 27, 1960 Referring Provider (PT): Derek Jack   Encounter Date: 08/10/2021   PT End of Session - 08/10/21 7026     Visit Number 13    Number of Visits 24    Date for PT Re-Evaluation 09/22/21    Authorization Type [employer] medicaid requested more visits.    Progress Note Due on Visit 24    PT Start Time 3785    PT Stop Time 1530    PT Time Calculation (min) 45 min    Activity Tolerance Patient tolerated treatment well    Behavior During Therapy WFL for tasks assessed/performed             Past Medical History:  Diagnosis Date   Colon cancer ([place])    Diabetes mellitus without complication ([place])    Hypertension     Past Surgical History:  Procedure Laterality Date   BIOPSY  01/13/2021   Procedure: BIOPSY;  Surgeon: Harvel Quale, MD;  [facility];  Service: Gastroenterology;;   CATARACT EXTRACTION Right    COLONOSCOPY WITH PROPOFOL N/A 01/13/2021   Procedure: COLONOSCOPY WITH PROPOFOL;  Surgeon: Harvel Quale, MD;  [facility];  Service: Gastroenterology;  Laterality: N/A;  11:05   ESOPHAGOGASTRODUODENOSCOPY (EGD) WITH PROPOFOL N/A 01/13/2021   Procedure: ESOPHAGOGASTRODUODENOSCOPY (EGD) WITH PROPOFOL;  Surgeon: Harvel Quale, MD;  [facility];  Service: Gastroenterology;  Laterality: N/A;   IR IMAGING GUIDED PORT INSERTION  01/24/2021   LIVER BIOPSY  01/10/2021   U/S guided   POLYPECTOMY  01/13/2021   Procedure: POLYPECTOMY;  Surgeon: Harvel Quale, MD;  [facility];  Service: Gastroenterology;;    There were no vitals filed for this visit.               Wound Therapy - 08/10/21 0001     Subjective Pt states that he does not  have much feeling in his foot.    Patient and Family Stated Goals wound to heal    Date of Onset 06/14/21    Prior Treatments ointment, MD dressing    Pain Scale 0-10    Pain Score 0-No pain    Evaluation and Treatment Procedures Explained to Patient/Family Yes    Evaluation and Treatment Procedures agreed to    Wound Properties Date First Assessed: 06/28/21 Time First Assessed: 1550 Wound Type: Other (Comment) Location: Foot Location Orientation: Right Wound Description (Comments): dorsal wound Present on Admission: Yes   Wound Image Images linked: 1    Dressing Type Abdominal pads;Compression wrap;Silver hydrofiber    Dressing Changed Changed    Dressing Status Old drainage    Dressing Change Frequency PRN    Site / Wound Assessment Granulation tissue;Red;Pink;Yellow    % Wound base Red or Granulating 50% was 0%   % Wound base Yellow/Fibrinous Exudate 50% was 100%   Wound Length (cm) 4 cm   was 4.8   Wound Width (cm) --   inferior 5 ; mid 3.1, superior 2.2;  was 5 at all aspects   Wound Depth (cm) --   deepest at this time is .4 cm but still has eschar;at eval  was unable to tell due to 100% eschar covering wound area   Margins Attached edges (approximated)  Drainage Amount Moderate    Drainage Description Serosanguineous    Treatment Cleansed;Debridement (Selective)    Selective Debridement - Location wound bed    Selective Debridement - Tools Used Forceps;Scalpel;Scissors    Selective Debridement - Tissue Removed eschar and slough    Wound Therapy - Clinical Statement Wound continues to improve in granulation.  Eschar continues to cover 50% of wound but is thinning with debridement.    Wound Therapy - Functional Problem List Comorbidity 1;Fitness    Factors Delaying/Impairing Wound Healing Other (comment)   colon cancer with active chemo   Wound Therapy - Frequency 2X / week    Wound Plan Pt will continue to benefit from skilled PT for sharp debridement and dressing change to  allow a healing environment and prevent infection.    Dressing  silverhydrofiber f/b 4x4 and profore lite compression bandaging.                       PT Short Term Goals - 08/10/21 1601       PT SHORT TERM GOAL #1   Title PT Rt dorsal wound wound to be 70% granulated    Time 3    Period Weeks    Status On-going    Target Date 07/19/21      PT SHORT TERM GOAL #2   Title Pt lateral wounds on Rt LE to be healed.    Time 3    Period Weeks    Status Achieved      PT SHORT TERM GOAL #3   Title Edema to be minimal and pt to have been measured for compression garments.    Time 3    Period Weeks    Status Achieved               PT Long Term Goals - 08/10/21 1601       PT LONG TERM GOAL #1   Title Wound on dorsal aspect of Rt foot to be healed    Status On-going    Target Date 08/09/21                   Plan - 08/10/21 1600     Clinical Impression Statement see above    Personal Factors and Comorbidities Comorbidity 1;Fitness    Comorbidities colon cancer with active chemo    Examination-Activity Limitations Bathing;Locomotion Level;Hygiene/Grooming;Dressing    Examination-Participation Restrictions Community Activity;Shop    Stability/Clinical Decision Making Evolving/Moderate complexity    Rehab Potential Good    PT Frequency 2x / week    PT Duration 6 weeks    PT Treatment/Interventions Other (comment);Compression bandaging;Patient/family education   debridement, compression dressing   PT Next Visit Plan Continue with debridement of wound.    PT Home Exercise Plan keep dressing dry    Consulted and Agree with Plan of Care Patient             Patient will benefit from skilled therapeutic intervention in order to improve the following deficits and impairments:  Decreased skin integrity, Increased edema, Difficulty walking  Visit Diagnosis: Open wound of foot, right, sequela - Plan: PT plan of care cert/re-cert  Difficulty in  walking, not elsewhere classified - Plan: PT plan of care cert/re-cert  Localized edema - Plan: PT plan of care cert/re-cert     Problem List Patient Active Problem List   Diagnosis Date Noted   Colitis 06/01/2021   Hypokalemia 06/01/2021   Hyperlipidemia 06/01/2021   Chronic  pain 06/01/2021   Metastasis to liver ([place]) 01/04/2021   Colon cancer metastasized to liver ([place]) 01/04/2021   Other constipation 01/04/2021   Rectal bleeding 01/04/2021   Nausea without vomiting 01/04/2021   Cancer associated pain 01/04/2021   Diabetes mellitus with neuropathy [facility])   [name], PT CLT [phone]  08/10/2021, 4:10 PM  [place] [address] [place], [place], [address] Phone: [phone]   Fax:  [phone]  Name: DUNBAR BURAS MRN: [id number] Date of Birth: 26-Sep-1960

## 2021-08-15 ENCOUNTER — Ambulatory Visit (HOSPITAL_COMMUNITY): Payer: Medicaid Other

## 2021-08-15 ENCOUNTER — Other Ambulatory Visit: Payer: Self-pay

## 2021-08-15 ENCOUNTER — Encounter (HOSPITAL_COMMUNITY): Payer: Self-pay

## 2021-08-15 DIAGNOSIS — R600 Localized edema: Secondary | ICD-10-CM

## 2021-08-15 DIAGNOSIS — S91301S Unspecified open wound, right foot, sequela: Secondary | ICD-10-CM

## 2021-08-15 DIAGNOSIS — R262 Difficulty in walking, not elsewhere classified: Secondary | ICD-10-CM

## 2021-08-15 NOTE — Therapy (Signed)
[place], [place], [address] Phone: [phone]   Fax:  [phone]  Wound Care Therapy  Patient Details  Name: Henry Johns MRN: [id number] Date of Birth: [birth year] Referring Provider (PT): Derek Jack   Encounter Date: 08/15/2021   PT End of Session - 08/15/21 1835     Visit Number 14    Number of Visits 24    Date for PT Re-Evaluation 09/22/21    Authorization Type [employer] medicaid requested more visits.    Progress Note Due on Visit 24    PT Start Time 1355    PT Stop Time 1435    PT Time Calculation (min) 40 min    Activity Tolerance Patient tolerated treatment well    Behavior During Therapy WFL for tasks assessed/performed             Past Medical History:  Diagnosis Date   Colon cancer ([place])    Diabetes mellitus without complication ([place])    Hypertension     Past Surgical History:  Procedure Laterality Date   BIOPSY  01/13/2021   Procedure: BIOPSY;  Surgeon: Harvel Quale, MD;  [facility];  Service: Gastroenterology;;   CATARACT EXTRACTION Right    COLONOSCOPY WITH PROPOFOL N/A 01/13/2021   Procedure: COLONOSCOPY WITH PROPOFOL;  Surgeon: Harvel Quale, MD;  [facility];  Service: Gastroenterology;  Laterality: N/A;  11:05   ESOPHAGOGASTRODUODENOSCOPY (EGD) WITH PROPOFOL N/A 01/13/2021   Procedure: ESOPHAGOGASTRODUODENOSCOPY (EGD) WITH PROPOFOL;  Surgeon: Harvel Quale, MD;  [facility];  Service: Gastroenterology;  Laterality: N/A;   IR IMAGING GUIDED PORT INSERTION  01/24/2021   LIVER BIOPSY  01/10/2021   U/S guided   POLYPECTOMY  01/13/2021   Procedure: POLYPECTOMY;  Surgeon: Harvel Quale, MD;  [facility];  Service: Gastroenterology;;    There were no vitals filed for this visit.    Subjective Assessment - 08/15/21 1742     Subjective Pt requested wheel chair at entrance,  reoprts increased abdominal swelling and back pain.  Stated he gets fluid pumped tomorrow.  No reports of pain in foot today.    Currently in Pain? Yes    Pain Score 4     Pain Location Back    Pain Orientation Lower    Pain Descriptors / Indicators Sore    Pain Type Chronic pain    Pain Frequency Intermittent                       Wound Therapy - 08/15/21 1742     Subjective Pt requested wheel chair at entrance, reoprts increased abdominal swelling and back pain.  Stated he gets fluid pumped tomorrow.  No reports of pain in foot today.    Patient and Family Stated Goals wound to heal    Date of Onset 06/14/21    Prior Treatments ointment, MD dressing    Evaluation and Treatment Procedures Explained to Patient/Family Yes    Evaluation and Treatment Procedures agreed to    Wound Properties Date First Assessed: 06/28/21 Time First Assessed: 1550 Wound Type: Other (Comment) Location: Foot Location Orientation: Right Wound Description (Comments): dorsal wound Present on Admission: Yes   Wound Image Images linked: 1    Dressing Type Silver hydrofiber   vaseline, silver hydrofiber, profore lite with netting   Dressing Changed Changed    Dressing Status Old drainage    Dressing Change Frequency  PRN    Site / Wound Assessment Granulation tissue;Yellow;Pink    % Wound base Red or Granulating 50%   following debridment   % Wound base Yellow/Fibrinous Exudate 50%    Margins Attached edges (approximated)    Drainage Amount Moderate    Drainage Description Serosanguineous    Treatment Cleansed;Debridement (Selective)    Selective Debridement - Location wound bed    Selective Debridement - Tools Used Forceps;Scalpel;Scissors    Selective Debridement - Tissue Removed eschar and slough    Wound Therapy - Clinical Statement Wound continues to improve in granulation tissues following debridment.  Continued with silver hydrofiber to address draninage and profore lite for edema control.     Wound Therapy - Functional Problem List Comorbidity 1;Fitness    Factors Delaying/Impairing Wound Healing Other (comment)   colon cancer and active chemo   Wound Therapy - Frequency 2X / week    Wound Plan Pt will continue to benefit from skilled PT for sharp debridement and dressing change to allow a healing environment and prevent infection.    Dressing  silverhydrofiber f/b 4x4 and profore lite compression bandaging.                       PT Short Term Goals - 08/10/21 1601       PT SHORT TERM GOAL #1   Title PT Rt dorsal wound wound to be 70% granulated    Time 3    Period Weeks    Status On-going    Target Date 07/19/21      PT SHORT TERM GOAL #2   Title Pt lateral wounds on Rt LE to be healed.    Time 3    Period Weeks    Status Achieved      PT SHORT TERM GOAL #3   Title Edema to be minimal and pt to have been measured for compression garments.    Time 3    Period Weeks    Status Achieved               PT Long Term Goals - 08/10/21 1601       PT LONG TERM GOAL #1   Title Wound on dorsal aspect of Rt foot to be healed    Status On-going    Target Date 08/09/21                    Patient will benefit from skilled therapeutic intervention in order to improve the following deficits and impairments:     Visit Diagnosis: Open wound of foot, right, sequela  Difficulty in walking, not elsewhere classified  Localized edema     Problem List Patient Active Problem List   Diagnosis Date Noted   Colitis 06/01/2021   Hypokalemia 06/01/2021   Hyperlipidemia 06/01/2021   Chronic pain 06/01/2021   Metastasis to liver ([place]) 01/04/2021   Colon cancer metastasized to liver ([place]) 01/04/2021   Other constipation 01/04/2021   Rectal bleeding 01/04/2021   Nausea without vomiting 01/04/2021   Cancer associated pain 01/04/2021   Diabetes mellitus with neuropathy [facility])    Ihor Austin, LPTA/CLT; CBIS [phone]  Aldona Lento, PTA 08/15/2021, 6:38 PM  [place], [place], [address] Phone: [phone]   Fax:  [phone]  Name: Henry Johns MRN: [id number] Date of Birth: 01/21/1961

## 2021-08-16 ENCOUNTER — Encounter (HOSPITAL_COMMUNITY): Payer: Self-pay

## 2021-08-16 ENCOUNTER — Ambulatory Visit (HOSPITAL_COMMUNITY)
Admission: RE | Admit: 2021-08-16 | Discharge: 2021-08-16 | Disposition: A | Discharge status: Home / Self Care | Payer: Medicaid Other | Source: Ambulatory Visit | Attending: Hematology | Admitting: Hematology

## 2021-08-16 DIAGNOSIS — C787 Secondary malignant neoplasm of liver and intrahepatic bile duct: Secondary | ICD-10-CM | POA: Diagnosis present

## 2021-08-16 DIAGNOSIS — C189 Malignant neoplasm of colon, unspecified: Secondary | ICD-10-CM | POA: Insufficient documentation

## 2021-08-16 DIAGNOSIS — R180 Malignant ascites: Secondary | ICD-10-CM | POA: Insufficient documentation

## 2021-08-16 MED ORDER — ALBUMIN HUMAN 25 % IV SOLN: 50.0000 g | Freq: Once | INTRAVENOUS | Status: AC

## 2021-08-16 MED ORDER — SODIUM CHLORIDE FLUSH 0.9 % IV SOLN
INTRAVENOUS | Status: AC
  Administered 2021-08-16: 10 mL via INTRAVENOUS
  Filled 2021-08-16: qty 10

## 2021-08-16 MED ORDER — ALBUMIN HUMAN 25 % IV SOLN
INTRAVENOUS | Status: AC
  Administered 2021-08-16: 50 g via INTRAVENOUS
  Filled 2021-08-16: qty 200

## 2021-08-16 NOTE — Progress Notes (Signed)
PT tolerated left sided paracentesis procedure and 50G of IV albumin well today and 6.3 Liters of clear yellow ascites removed with vital signs remaining stale. PT verbalized understanding of discharge instructions and left via wheelchair with wife from lobby with no acute distress noted.

## 2021-08-16 NOTE — Procedures (Signed)
PreOperative Dx: Malignant ascites Postoperative Dx: Malignant ascites Procedure:   US guided paracentesis Radiologist:  Thornton Papas Anesthesia:  10 ml of1% lidocaine Specimen:  6.3 L of yellow ascitic fluid EBL:   < 1 ml Complications: None

## 2021-08-21 ENCOUNTER — Ambulatory Visit (HOSPITAL_COMMUNITY): Payer: Medicaid Other | Admitting: Hematology

## 2021-08-21 ENCOUNTER — Other Ambulatory Visit (HOSPITAL_COMMUNITY): Payer: Medicaid Other

## 2021-08-21 ENCOUNTER — Ambulatory Visit (HOSPITAL_COMMUNITY): Payer: Medicaid Other

## 2021-08-22 ENCOUNTER — Ambulatory Visit (HOSPITAL_COMMUNITY): Payer: Medicaid Other | Admitting: Physical Therapy

## 2021-08-22 ENCOUNTER — Other Ambulatory Visit: Payer: Self-pay

## 2021-08-22 DIAGNOSIS — S91301S Unspecified open wound, right foot, sequela: Secondary | ICD-10-CM | POA: Diagnosis not present

## 2021-08-22 DIAGNOSIS — R262 Difficulty in walking, not elsewhere classified: Secondary | ICD-10-CM

## 2021-08-22 DIAGNOSIS — R600 Localized edema: Secondary | ICD-10-CM

## 2021-08-22 DIAGNOSIS — S81801S Unspecified open wound, right lower leg, sequela: Secondary | ICD-10-CM

## 2021-08-22 NOTE — Therapy (Signed)
[place], [place], [address] Phone: [phone]   Fax:  [phone]  Wound Care Therapy  Patient Details  Name: Henry Johns MRN: [id number] Date of Birth: [birth year] Referring Provider (PT): Derek Jack   Encounter Date: 08/22/2021   PT End of Session - 08/22/21 1534     Visit Number 15    Number of Visits 24    Date for PT Re-Evaluation 09/22/21    Authorization Type [employer] medicaid requested more visits.    Progress Note Due on Visit 24    PT Start Time 1405    PT Stop Time 1438    PT Time Calculation (min) 33 min    Activity Tolerance Patient tolerated treatment well    Behavior During Therapy WFL for tasks assessed/performed             Past Medical History:  Diagnosis Date   Colon cancer ([place])    Diabetes mellitus without complication ([place])    Hypertension     Past Surgical History:  Procedure Laterality Date   BIOPSY  01/13/2021   Procedure: BIOPSY;  Surgeon: Harvel Quale, MD;  [facility];  Service: Gastroenterology;;   CATARACT EXTRACTION Right    COLONOSCOPY WITH PROPOFOL N/A 01/13/2021   Procedure: COLONOSCOPY WITH PROPOFOL;  Surgeon: Harvel Quale, MD;  [facility];  Service: Gastroenterology;  Laterality: N/A;  11:05   ESOPHAGOGASTRODUODENOSCOPY (EGD) WITH PROPOFOL N/A 01/13/2021   Procedure: ESOPHAGOGASTRODUODENOSCOPY (EGD) WITH PROPOFOL;  Surgeon: Harvel Quale, MD;  [facility];  Service: Gastroenterology;  Laterality: N/A;   IR IMAGING GUIDED PORT INSERTION  01/24/2021   LIVER BIOPSY  01/10/2021   U/S guided   POLYPECTOMY  01/13/2021   Procedure: POLYPECTOMY;  Surgeon: Harvel Quale, MD;  [facility];  Service: Gastroenterology;;    There were no vitals filed for this visit.               Wound Therapy - 08/22/21 1500     Subjective pt reports he goes  tomorrow to get his fluid pumped off.  Pt was able to walk back to wound room today.    Patient and Family Stated Goals wound to heal    Date of Onset 06/14/21    Prior Treatments ointment, MD dressing    Pain Score 4     Pain Type Chronic pain    Pain Location Back    Pain Orientation Lower    Pain Descriptors / Indicators Sore    Evaluation and Treatment Procedures Explained to Patient/Family Yes    Evaluation and Treatment Procedures agreed to    Wound Properties Date First Assessed: 08/15/21 Time First Assessed: 0100 Wound Type: Other (Comment) Location: Foot Location Orientation: Anterior;Right;Lateral Present on Admission: Yes   Dressing Type Impregnated gauze (bismuth)    Dressing Changed Changed    Dressing Status Old drainage    Dressing Change Frequency PRN    Site / Wound Assessment Granulation tissue;Pink;Yellow    % Wound base Red or Granulating 60%    % Wound base Yellow/Fibrinous Exudate 40%    Peri-wound Assessment Intact;Erythema (blanchable)    Wound Length (cm) 1.4 cm    Wound Width (cm) 1 cm    Wound Depth (cm) 0.3 cm    Wound Volume (cm^3) 0.42 cm^3    Wound Surface Area (cm^2) 1.4 cm^2    Drainage Amount Moderate    Drainage Description  Serosanguineous    Treatment Cleansed;Debridement (Selective)    Wound Properties Date First Assessed: 06/28/21 Time First Assessed: 1550 Wound Type: Other (Comment) Location: Foot Location Orientation: Right Wound Description (Comments): dorsal wound Present on Admission: Yes   Dressing Type Silver hydrofiber   vaseline, silver hydrofiber, profore lite with netting   Dressing Changed Changed    Dressing Status Old drainage    Dressing Change Frequency PRN    Site / Wound Assessment Granulation tissue;Yellow;Pink    % Wound base Red or Granulating 60%    % Wound base Yellow/Fibrinous Exudate 40%    Wound Length (cm) 4 cm    Wound Width (cm) 3.5 cm    Wound Depth (cm) 0.3 cm    Wound Volume (cm^3) 4.2 cm^3    Wound Surface  Area (cm^2) 14 cm^2    Margins Attached edges (approximated)    Drainage Amount Moderate    Drainage Description Serosanguineous    Treatment Cleansed;Debridement (Selective)    Selective Debridement - Location wound bed    Selective Debridement - Tools Used Forceps;Scalpel;Scissors    Selective Debridement - Tissue Removed eschar and slough    Wound Therapy - Clinical Statement Made a seperate measurement for small lateral wound that is not attached to primary wound at this point.  Able to remove edges of wound and devitalized tissue to promote further approximation.  Moisturized LE well prior to reapplication of profore lite.  Added ABD as drainage had soaked through.    Wound Therapy - Functional Problem List Comorbidity 1;Fitness    Factors Delaying/Impairing Wound Healing Other (comment)   colon cancer and active chemo   Wound Therapy - Frequency 2X / week    Wound Plan Pt will continue to benefit from skilled PT for sharp debridement and dressing change to allow a healing environment and prevent infection.    Dressing  silverhydrofiber f/b 4x4 and profore lite compression bandaging.                       PT Short Term Goals - 08/10/21 1601       PT SHORT TERM GOAL #1   Title PT Rt dorsal wound wound to be 70% granulated    Time 3    Period Weeks    Status On-going    Target Date 07/19/21      PT SHORT TERM GOAL #2   Title Pt lateral wounds on Rt LE to be healed.    Time 3    Period Weeks    Status Achieved      PT SHORT TERM GOAL #3   Title Edema to be minimal and pt to have been measured for compression garments.    Time 3    Period Weeks    Status Achieved               PT Long Term Goals - 08/10/21 1601       PT LONG TERM GOAL #1   Title Wound on dorsal aspect of Rt foot to be healed    Status On-going    Target Date 08/09/21                    Patient will benefit from skilled therapeutic intervention in order to improve the  following deficits and impairments:     Visit Diagnosis: Open wound of foot, right, sequela  Difficulty in walking, not elsewhere classified  Multiple open wounds of lower leg, right,  sequela  Localized edema     Problem List Patient Active Problem List   Diagnosis Date Noted   Colitis 06/01/2021   Hypokalemia 06/01/2021   Hyperlipidemia 06/01/2021   Chronic pain 06/01/2021   Metastasis to liver ([place]) 01/04/2021   Colon cancer metastasized to liver ([place]) 01/04/2021   Other constipation 01/04/2021   Rectal bleeding 01/04/2021   Nausea without vomiting 01/04/2021   Cancer associated pain 01/04/2021   Diabetes mellitus with neuropathy [facility])    Teena Irani, PTA/CLT, WTA [phone]  Teena Irani, PTA 08/22/2021, 3:35 PM  [place] [address] [place], [place], [address] Phone: [phone]   Fax:  [phone]  Name: Henry Johns MRN: [id number] Date of Birth: 1961/06/29

## 2021-08-23 ENCOUNTER — Ambulatory Visit (HOSPITAL_COMMUNITY)
Admission: RE | Admit: 2021-08-23 | Discharge: 2021-08-23 | Disposition: A | Discharge status: Home / Self Care | Payer: Medicaid Other | Source: Ambulatory Visit | Attending: Hematology | Admitting: Hematology

## 2021-08-23 ENCOUNTER — Encounter (HOSPITAL_COMMUNITY): Payer: Self-pay

## 2021-08-23 DIAGNOSIS — R180 Malignant ascites: Secondary | ICD-10-CM | POA: Insufficient documentation

## 2021-08-23 DIAGNOSIS — C189 Malignant neoplasm of colon, unspecified: Secondary | ICD-10-CM | POA: Diagnosis not present

## 2021-08-23 DIAGNOSIS — C787 Secondary malignant neoplasm of liver and intrahepatic bile duct: Secondary | ICD-10-CM | POA: Diagnosis present

## 2021-08-23 MED ORDER — ALBUMIN HUMAN 25 % IV SOLN
INTRAVENOUS | Status: AC
  Filled 2021-08-23: qty 200

## 2021-08-23 MED ORDER — SODIUM CHLORIDE FLUSH 0.9 % IV SOLN
INTRAVENOUS | Status: AC
  Administered 2021-08-23: 10 mL
  Filled 2021-08-23: qty 10

## 2021-08-23 MED ORDER — ALBUMIN HUMAN 25 % IV SOLN
INTRAVENOUS | Status: AC
  Administered 2021-08-23: 50 g via INTRAVENOUS
  Filled 2021-08-23: qty 200

## 2021-08-23 MED ORDER — ALBUMIN HUMAN 25 % IV SOLN: 50.0000 g | Freq: Once | INTRAVENOUS | Status: AC

## 2021-08-23 NOTE — Procedures (Signed)
PreOperative Dx: Malignant ascites Postoperative Dx: Malignant ascites Procedure:   US guided paracentesis Radiologist:  Thornton Papas Anesthesia:  10 ml of1% lidocaine Specimen:  4 L of yellow ascitic fluid EBL:   < 1 ml Complications: none

## 2021-08-23 NOTE — Progress Notes (Signed)
PT tolerated left sided paracentesis and 50 G of IV albumin well today and 4 Liters of clear yellow ascites removed. PT left via wheelchair with brother and verbalized understanding of discharge instructions with no acute distress noted.

## 2021-08-24 ENCOUNTER — Other Ambulatory Visit: Payer: Self-pay

## 2021-08-24 ENCOUNTER — Ambulatory Visit (HOSPITAL_COMMUNITY): Payer: Medicaid Other | Admitting: Hematology

## 2021-08-24 ENCOUNTER — Encounter (HOSPITAL_COMMUNITY): Payer: Self-pay | Admitting: Physical Therapy

## 2021-08-24 ENCOUNTER — Ambulatory Visit (HOSPITAL_COMMUNITY): Payer: Medicaid Other | Admitting: Physical Therapy

## 2021-08-24 DIAGNOSIS — R600 Localized edema: Secondary | ICD-10-CM

## 2021-08-24 DIAGNOSIS — S81801S Unspecified open wound, right lower leg, sequela: Secondary | ICD-10-CM

## 2021-08-24 DIAGNOSIS — R262 Difficulty in walking, not elsewhere classified: Secondary | ICD-10-CM

## 2021-08-24 DIAGNOSIS — S91301S Unspecified open wound, right foot, sequela: Secondary | ICD-10-CM | POA: Diagnosis not present

## 2021-08-24 NOTE — Therapy (Signed)
[facility], [place], [address] Phone: [phone]   Fax:  [phone]  Wound Care Therapy  Patient Details  Name: Henry Johns MRN: [id number] Date of Birth: January 12, 1961 Referring Provider (PT): Derek Jack   Encounter Date: 08/24/2021   PT End of Session - 08/24/21 1324     Visit Number 16    Number of Visits 24    Date for PT Re-Evaluation 09/22/21    Authorization Type [employer] medicaid requested more visits.    Progress Note Due on Visit 24    PT Start Time 1324    PT Stop Time 1400    PT Time Calculation (min) 36 min    Activity Tolerance Patient tolerated treatment well    Behavior During Therapy WFL for tasks assessed/performed             Past Medical History:  Diagnosis Date   Colon cancer ([place])    Diabetes mellitus without complication ([place])    Hypertension     Past Surgical History:  Procedure Laterality Date   BIOPSY  01/13/2021   Procedure: BIOPSY;  Surgeon: Harvel Quale, MD;  [facility];  Service: Gastroenterology;;   CATARACT EXTRACTION Right    COLONOSCOPY WITH PROPOFOL N/A 01/13/2021   Procedure: COLONOSCOPY WITH PROPOFOL;  Surgeon: Harvel Quale, MD;  [facility];  Service: Gastroenterology;  Laterality: N/A;  11:05   ESOPHAGOGASTRODUODENOSCOPY (EGD) WITH PROPOFOL N/A 01/13/2021   Procedure: ESOPHAGOGASTRODUODENOSCOPY (EGD) WITH PROPOFOL;  Surgeon: Harvel Quale, MD;  [facility];  Service: Gastroenterology;  Laterality: N/A;   IR IMAGING GUIDED PORT INSERTION  01/24/2021   LIVER BIOPSY  01/10/2021   U/S guided   POLYPECTOMY  01/13/2021   Procedure: POLYPECTOMY;  Surgeon: Harvel Quale, MD;  [facility];  Service: Gastroenterology;;    There were no vitals filed for this visit.               Wound Therapy - 08/24/21 0001     Subjective pt reports he goes  tomorrow to get his fluid pumped off.  Pt was able to walk back to wound room today.    Patient and Family Stated Goals wound to heal    Date of Onset 06/14/21    Prior Treatments ointment, MD dressing    Pain Score 0-No pain    Evaluation and Treatment Procedures Explained to Patient/Family Yes    Evaluation and Treatment Procedures agreed to    Wound Properties Date First Assessed: 08/15/21 Time First Assessed: 0100 Wound Type: Other (Comment) Location: Foot Location Orientation: Anterior;Right;Lateral Present on Admission: Yes   Dressing Type Impregnated gauze (bismuth)    Dressing Changed Changed    Dressing Status Old drainage    Dressing Change Frequency PRN    Site / Wound Assessment Granulation tissue;Pink;Yellow    % Wound base Red or Granulating 50%    % Wound base Yellow/Fibrinous Exudate 50%    Peri-wound Assessment Intact;Erythema (blanchable)    Drainage Amount Moderate    Drainage Description Serosanguineous    Treatment Cleansed;Debridement (Selective)    Wound Properties Date First Assessed: 06/28/21 Time First Assessed: 1550 Wound Type: Other (Comment) Location: Foot Location Orientation: Right Wound Description (Comments): dorsal wound Present on Admission: Yes   Wound Image Images linked: 1    Dressing Type Silver hydrofiber   vaseline, silver hydrofiber, profore lite with netting   Dressing Changed Changed  Dressing Status Old drainage    Dressing Change Frequency PRN    Site / Wound Assessment Granulation tissue;Yellow;Pink    % Wound base Red or Granulating 60%    % Wound base Yellow/Fibrinous Exudate 40%    Margins Attached edges (approximated)    Drainage Amount Moderate    Drainage Description Serosanguineous    Treatment Cleansed;Debridement (Selective)    Selective Debridement - Location wound bed    Selective Debridement - Tools Used Forceps;Scalpel;Scissors    Selective Debridement - Tissue Removed slough    Wound Therapy - Clinical Statement Patient  with continued slough in wound beds but he tolerates debridement well. Several areas of hyper granulation present and continued drainage with damp abd upon removal. Changed dressings to medihoney alginate for drainage and slough removal followed by 4x4, foam, abd, and profore lite with cloth wrap before cotton as patient states itchiness.    Wound Therapy - Functional Problem List Comorbidity 1;Fitness    Factors Delaying/Impairing Wound Healing Other (comment)   colon cancer and active chemo   Wound Therapy - Frequency 2X / week    Wound Plan Pt will continue to benefit from skilled PT for sharp debridement and dressing change to allow a healing environment and prevent infection.    Dressing  medihoney alginate f/b 4x4, foam, abd,  and profore lite compression bandaging.                       PT Short Term Goals - 08/10/21 1601       PT SHORT TERM GOAL #1   Title PT Rt dorsal wound wound to be 70% granulated    Time 3    Period Weeks    Status On-going    Target Date 07/19/21      PT SHORT TERM GOAL #2   Title Pt lateral wounds on Rt LE to be healed.    Time 3    Period Weeks    Status Achieved      PT SHORT TERM GOAL #3   Title Edema to be minimal and pt to have been measured for compression garments.    Time 3    Period Weeks    Status Achieved               PT Long Term Goals - 08/10/21 1601       PT LONG TERM GOAL #1   Title Wound on dorsal aspect of Rt foot to be healed    Status On-going    Target Date 08/09/21                   Plan - 08/24/21 1324     Clinical Impression Statement see above    Personal Factors and Comorbidities Comorbidity 1;Fitness    Comorbidities colon cancer with active chemo    Examination-Activity Limitations Bathing;Locomotion Level;Hygiene/Grooming;Dressing    Examination-Participation Restrictions Community Activity;Shop    Stability/Clinical Decision Making Evolving/Moderate complexity    Rehab Potential  Good    PT Frequency 2x / week    PT Duration 6 weeks    PT Treatment/Interventions Other (comment);Compression bandaging;Patient/family education   debridement, compression dressing   PT Next Visit Plan Continue with debridement of wound.    PT Home Exercise Plan keep dressing dry    Consulted and Agree with Plan of Care Patient             Patient will benefit from skilled therapeutic intervention in order  to improve the following deficits and impairments:  Decreased skin integrity, Increased edema, Difficulty walking  Visit Diagnosis: Open wound of foot, right, sequela  Difficulty in walking, not elsewhere classified  Multiple open wounds of lower leg, right, sequela  Localized edema     Problem List Patient Active Problem List   Diagnosis Date Noted   Colitis 06/01/2021   Hypokalemia 06/01/2021   Hyperlipidemia 06/01/2021   Chronic pain 06/01/2021   Metastasis to liver ([place]) 01/04/2021   Colon cancer metastasized to liver ([place]) 01/04/2021   Other constipation 01/04/2021   Rectal bleeding 01/04/2021   Nausea without vomiting 01/04/2021   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

## 2021-08-29 ENCOUNTER — Ambulatory Visit (HOSPITAL_COMMUNITY): Payer: Medicaid Other | Admitting: Physical Therapy

## 2021-08-29 ENCOUNTER — Other Ambulatory Visit: Payer: Self-pay

## 2021-08-29 DIAGNOSIS — S91301S Unspecified open wound, right foot, sequela: Secondary | ICD-10-CM

## 2021-08-29 DIAGNOSIS — R262 Difficulty in walking, not elsewhere classified: Secondary | ICD-10-CM

## 2021-08-29 DIAGNOSIS — S81801S Unspecified open wound, right lower leg, sequela: Secondary | ICD-10-CM

## 2021-08-29 NOTE — Therapy (Signed)
[place], [place], [address] Phone: [phone]   Fax:  [phone]  Wound Care Therapy  Patient Details  Name: Henry Johns MRN: [id number] Date of Birth: Nov 16, 1960 Referring Provider (PT): Derek Jack   Encounter Date: 08/29/2021   PT End of Session - 08/29/21 1616     Visit Number 17    Number of Visits 24    Date for PT Re-Evaluation 09/22/21    Authorization Type [employer] medicaid requested more visits.    Progress Note Due on Visit 24    PT Start Time 1450    PT Stop Time 1525    PT Time Calculation (min) 35 min    Activity Tolerance Patient tolerated treatment well    Behavior During Therapy WFL for tasks assessed/performed             Past Medical History:  Diagnosis Date   Colon cancer ([place])    Diabetes mellitus without complication ([place])    Hypertension     Past Surgical History:  Procedure Laterality Date   BIOPSY  01/13/2021   Procedure: BIOPSY;  Surgeon: Harvel Quale, MD;  [facility];  Service: Gastroenterology;;   CATARACT EXTRACTION Right    COLONOSCOPY WITH PROPOFOL N/A 01/13/2021   Procedure: COLONOSCOPY WITH PROPOFOL;  Surgeon: Harvel Quale, MD;  [facility];  Service: Gastroenterology;  Laterality: N/A;  11:05   ESOPHAGOGASTRODUODENOSCOPY (EGD) WITH PROPOFOL N/A 01/13/2021   Procedure: ESOPHAGOGASTRODUODENOSCOPY (EGD) WITH PROPOFOL;  Surgeon: Harvel Quale, MD;  [facility];  Service: Gastroenterology;  Laterality: N/A;   IR IMAGING GUIDED PORT INSERTION  01/24/2021   LIVER BIOPSY  01/10/2021   U/S guided   POLYPECTOMY  01/13/2021   Procedure: POLYPECTOMY;  Surgeon: Harvel Quale, MD;  [facility];  Service: Gastroenterology;;    There were no vitals filed for this visit.               Wound Therapy - 08/29/21 1609     Subjective pt states he had to pull  the top of the bandage down because it was itching so badly.    Patient and Family Stated Goals wound to heal    Date of Onset 06/14/21    Prior Treatments ointment, MD dressing    Pain Score 0-No pain    Evaluation and Treatment Procedures Explained to Patient/Family Yes    Evaluation and Treatment Procedures agreed to    Wound Properties Date First Assessed: 08/15/21 Time First Assessed: 0100 Wound Type: Other (Comment) Location: Foot Location Orientation: Anterior;Right;Lateral Present on Admission: Yes   Dressing Type Impregnated gauze (bismuth)    Dressing Changed Changed    Dressing Status Old drainage    Dressing Change Frequency PRN    Site / Wound Assessment Granulation tissue;Pink;Yellow    % Wound base Red or Granulating 60%    % Wound base Yellow/Fibrinous Exudate 40%    Peri-wound Assessment Intact;Erythema (blanchable)    Drainage Amount Moderate    Drainage Description Purulent;Green;Serosanguineous    Treatment Cleansed;Debridement (Selective)    Wound Properties Date First Assessed: 06/28/21 Time First Assessed: 1550 Wound Type: Other (Comment) Location: Foot Location Orientation: Right Wound Description (Comments): dorsal wound Present on Admission: Yes   Dressing Type Silver hydrofiber   vaseline, silver hydrofiber, profore lite with netting   Dressing Changed Changed    Dressing Status Old drainage    Dressing Change Frequency PRN  Site / Wound Assessment Granulation tissue;Yellow;Pink    % Wound base Red or Granulating 60%    % Wound base Yellow/Fibrinous Exudate 40%    Margins Attached edges (approximated)    Drainage Amount Moderate    Drainage Description Purulent;Green;Serosanguineous    Treatment Cleansed;Debridement (Selective)    Selective Debridement - Location wound bed    Selective Debridement - Tools Used Forceps;Scalpel;Scissors    Selective Debridement - Tissue Removed slough    Wound Therapy - Clinical Statement Bandage pushed half way down LE  with noted scratch marks from pt scratching LE. Educated not to do this as could cause additional wound.  Cleansed wound well and debrided border to discover more attached edges present.  Green purulent drainage without odor present so changed dressing to silver hydrofiber to reduce this.  Vaseling applied around wound and covered with gauze and medipore tape.  ABD and 1/2" foam placed over this and conform used to secure to foot.  Pt requested to leave off compression to knee.  Informed we would try this but if edema returned we would have to go back to compression.  Pt verbalized understanding.    Wound Therapy - Functional Problem List Comorbidity 1;Fitness    Factors Delaying/Impairing Wound Healing Other (comment)   colon cancer and active chemo   Wound Therapy - Frequency 2X / week    Wound Plan Pt will continue to benefit from skilled PT for sharp debridement and dressing change to allow a healing environment and prevent infection.    Dressing  silver hydrofiber, 2X2, ABD, 1/2" foam conform and #5 netting                       PT Short Term Goals - 08/10/21 1601       PT SHORT TERM GOAL #1   Title PT Rt dorsal wound wound to be 70% granulated    Time 3    Period Weeks    Status On-going    Target Date 07/19/21      PT SHORT TERM GOAL #2   Title Pt lateral wounds on Rt LE to be healed.    Time 3    Period Weeks    Status Achieved      PT SHORT TERM GOAL #3   Title Edema to be minimal and pt to have been measured for compression garments.    Time 3    Period Weeks    Status Achieved               PT Long Term Goals - 08/10/21 1601       PT LONG TERM GOAL #1   Title Wound on dorsal aspect of Rt foot to be healed    Status On-going    Target Date 08/09/21                    Patient will benefit from skilled therapeutic intervention in order to improve the following deficits and impairments:     Visit Diagnosis: Open wound of foot, right,  sequela  Difficulty in walking, not elsewhere classified  Multiple open wounds of lower leg, right, sequela     Problem List Patient Active Problem List   Diagnosis Date Noted   Colitis 06/01/2021   Hypokalemia 06/01/2021   Hyperlipidemia 06/01/2021   Chronic pain 06/01/2021   Metastasis to liver ([place]) 01/04/2021   Colon cancer metastasized to liver ([place]) 01/04/2021   Other constipation 01/04/2021  Rectal bleeding 01/04/2021   Nausea without vomiting 01/04/2021   Cancer associated pain 01/04/2021   Diabetes mellitus with neuropathy ([place])    .may  Teena Irani, PTA 08/29/2021, 4:16 PM  [facility] [address] [place], [place], [address] Phone: [phone]   Fax:  [phone]  Name: Henry Johns MRN: [id number] Date of Birth: 1961-03-27

## 2021-08-30 ENCOUNTER — Other Ambulatory Visit (HOSPITAL_COMMUNITY): Payer: Self-pay | Admitting: *Deleted

## 2021-08-30 ENCOUNTER — Encounter (HOSPITAL_COMMUNITY): Payer: Self-pay

## 2021-08-30 ENCOUNTER — Ambulatory Visit (HOSPITAL_COMMUNITY)
Admission: RE | Admit: 2021-08-30 | Discharge: 2021-08-30 | Disposition: A | Discharge status: Home / Self Care | Payer: Medicaid Other | Source: Ambulatory Visit | Attending: Hematology | Admitting: Hematology

## 2021-08-30 DIAGNOSIS — C189 Malignant neoplasm of colon, unspecified: Secondary | ICD-10-CM | POA: Insufficient documentation

## 2021-08-30 DIAGNOSIS — C787 Secondary malignant neoplasm of liver and intrahepatic bile duct: Secondary | ICD-10-CM | POA: Insufficient documentation

## 2021-08-30 DIAGNOSIS — R180 Malignant ascites: Secondary | ICD-10-CM | POA: Insufficient documentation

## 2021-08-30 MED ORDER — ALBUMIN HUMAN 25 % IV SOLN
INTRAVENOUS | Status: AC
  Administered 2021-08-30: 50 g via INTRAVENOUS
  Filled 2021-08-30: qty 200

## 2021-08-30 MED ORDER — SODIUM CHLORIDE FLUSH 0.9 % IV SOLN
INTRAVENOUS | Status: AC
  Administered 2021-08-30: 10 mL
  Filled 2021-08-30: qty 10

## 2021-08-30 MED ORDER — MORPHINE SULFATE 15 MG PO TABS: 15.0000 mg | ORAL_TABLET | Freq: Two times a day (BID) | ORAL | 0 refills | Status: AC | PRN

## 2021-08-30 MED ORDER — ALBUMIN HUMAN 25 % IV SOLN: 50.0000 g | Freq: Once | INTRAVENOUS | Status: AC

## 2021-08-30 NOTE — Procedures (Addendum)
°  US guided RLQ paracentesis  5 L yellow fluid EBL: none   No labs per MD  Tolerated well

## 2021-08-30 NOTE — Progress Notes (Signed)
PT tolerated right sided paracentesis procedure and 50G of IV albumin well today. 5 Liters of clear yellow fluid removed and patient verbalized understanding of discharge instructions. PT pushed to front entrance of hospital at departure with no acute distress noted.

## 2021-08-31 ENCOUNTER — Other Ambulatory Visit: Payer: Self-pay

## 2021-08-31 ENCOUNTER — Ambulatory Visit (HOSPITAL_COMMUNITY): Payer: Medicaid Other | Admitting: Physical Therapy

## 2021-08-31 DIAGNOSIS — S81801S Unspecified open wound, right lower leg, sequela: Secondary | ICD-10-CM

## 2021-08-31 DIAGNOSIS — R262 Difficulty in walking, not elsewhere classified: Secondary | ICD-10-CM

## 2021-08-31 DIAGNOSIS — S91301S Unspecified open wound, right foot, sequela: Secondary | ICD-10-CM | POA: Diagnosis not present

## 2021-08-31 NOTE — Therapy (Signed)
[place], [place], [address] Phone: [phone]   Fax:  [phone]  Wound Care Therapy  Patient Details  Name: Henry Johns MRN: [id number] Date of Birth: October 12, 1960 Referring Provider (PT): Derek Jack   Encounter Date: 08/31/2021   PT End of Session - 08/31/21 1632     Visit Number 18    Number of Visits 24    Date for PT Re-Evaluation 09/22/21    Authorization Type [employer] medicaid requested more visits.    Progress Note Due on Visit 24    PT Start Time 1532    PT Stop Time 1555    PT Time Calculation (min) 23 min    Activity Tolerance Patient tolerated treatment well    Behavior During Therapy WFL for tasks assessed/performed             Past Medical History:  Diagnosis Date   Colon cancer ([place])    Diabetes mellitus without complication ([place])    Hypertension     Past Surgical History:  Procedure Laterality Date   BIOPSY  01/13/2021   Procedure: BIOPSY;  Surgeon: Harvel Quale, MD;  [facility];  Service: Gastroenterology;;   CATARACT EXTRACTION Right    COLONOSCOPY WITH PROPOFOL N/A 01/13/2021   Procedure: COLONOSCOPY WITH PROPOFOL;  Surgeon: Harvel Quale, MD;  [facility];  Service: Gastroenterology;  Laterality: N/A;  11:05   ESOPHAGOGASTRODUODENOSCOPY (EGD) WITH PROPOFOL N/A 01/13/2021   Procedure: ESOPHAGOGASTRODUODENOSCOPY (EGD) WITH PROPOFOL;  Surgeon: Harvel Quale, MD;  [facility];  Service: Gastroenterology;  Laterality: N/A;   IR IMAGING GUIDED PORT INSERTION  01/24/2021   LIVER BIOPSY  01/10/2021   U/S guided   POLYPECTOMY  01/13/2021   Procedure: POLYPECTOMY;  Surgeon: Harvel Quale, MD;  [facility];  Service: Gastroenterology;;    There were no vitals filed for this visit.               Wound Therapy - 08/31/21 1604     Subjective pt states the dressing was  much better and his leg didn't swell.    Patient and Family Stated Goals wound to heal    Date of Onset 06/14/21    Prior Treatments ointment, MD dressing    Evaluation and Treatment Procedures Explained to Patient/Family Yes    Evaluation and Treatment Procedures agreed to    Wound Properties Date First Assessed: 08/15/21 Time First Assessed: 0100 Wound Type: Other (Comment) Location: Foot Location Orientation: Anterior;Right;Lateral Present on Admission: Yes   Dressing Type Impregnated gauze (bismuth)    Dressing Changed Changed    Dressing Status Old drainage    Dressing Change Frequency PRN    Site / Wound Assessment Granulation tissue;Pink;Yellow    % Wound base Red or Granulating 75%    % Wound base Yellow/Fibrinous Exudate 25%    Peri-wound Assessment Intact;Erythema (blanchable)    Wound Length (cm) 1 cm    Wound Width (cm) 0.8 cm    Wound Depth (cm) 0.2 cm    Wound Volume (cm^3) 0.16 cm^3    Wound Surface Area (cm^2) 0.8 cm^2    Drainage Amount Moderate    Drainage Description Serosanguineous    Treatment Cleansed;Debridement (Selective)    Wound Properties Date First Assessed: 06/28/21 Time First Assessed: 1550 Wound Type: Other (Comment) Location: Foot Location Orientation: Right Wound Description (Comments): dorsal wound Present on Admission: Yes   Wound Image Images linked:  1    Dressing Type Silver hydrofiber   vaseline, silver hydrofiber, profore lite with netting   Dressing Changed Changed    Dressing Status Old drainage    Dressing Change Frequency PRN    Site / Wound Assessment Granulation tissue;Yellow;Pink    % Wound base Red or Granulating 65%    % Wound base Yellow/Fibrinous Exudate 35%    Wound Length (cm) 3.2 cm    Wound Width (cm) 2.6 cm    Wound Depth (cm) 0.2 cm    Wound Volume (cm^3) 1.66 cm^3    Wound Surface Area (cm^2) 8.32 cm^2    Margins Attached edges (approximated)    Selective Debridement - Location wound bed    Selective Debridement - Tools  Used Forceps;Scalpel;Scissors    Selective Debridement - Tissue Removed slough    Wound Therapy - Clinical Statement Pt with vast improvement, approximated nearly 2 cm more since last week.  No edema present since no longer using profore and increasing approximation of wound.  Pt reports improved comfort with just bandage on foot.    Wound Therapy - Functional Problem List Comorbidity 1;Fitness    Factors Delaying/Impairing Wound Healing Other (comment)   colon cancer and active chemo   Wound Therapy - Frequency 2X / week    Wound Plan Pt will continue to benefit from skilled PT for sharp debridement and dressing change to allow a healing environment and prevent infection.    Dressing  silver hydrofiber, 2X2, ABD, 1/2" foam conform and #5 netting                       PT Short Term Goals - 08/10/21 1601       PT SHORT TERM GOAL #1   Title PT Rt dorsal wound wound to be 70% granulated    Time 3    Period Weeks    Status On-going    Target Date 07/19/21      PT SHORT TERM GOAL #2   Title Pt lateral wounds on Rt LE to be healed.    Time 3    Period Weeks    Status Achieved      PT SHORT TERM GOAL #3   Title Edema to be minimal and pt to have been measured for compression garments.    Time 3    Period Weeks    Status Achieved               PT Long Term Goals - 08/10/21 1601       PT LONG TERM GOAL #1   Title Wound on dorsal aspect of Rt foot to be healed    Status On-going    Target Date 08/09/21                    Patient will benefit from skilled therapeutic intervention in order to improve the following deficits and impairments:     Visit Diagnosis: Open wound of foot, right, sequela  Multiple open wounds of lower leg, right, sequela  Difficulty in walking, not elsewhere classified     Problem List Patient Active Problem List   Diagnosis Date Noted   Colitis 06/01/2021   Hypokalemia 06/01/2021   Hyperlipidemia 06/01/2021    Chronic pain 06/01/2021   Metastasis to liver ([place]) 01/04/2021   Colon cancer metastasized to liver ([place]) 01/04/2021   Other constipation 01/04/2021   Rectal bleeding 01/04/2021   Nausea without vomiting 01/04/2021   Cancer  associated pain 01/04/2021   Diabetes mellitus with neuropathy [facility])    Teena Irani, PTA/CLT, WTA [phone]  Teena Irani, PTA 08/31/2021, 4:34 PM  [facility] [address] [place], [place], [address] Phone: [phone]   Fax:  [phone]  Name: Henry Johns MRN: [id number] Date of Birth: [birth year]

## 2021-09-04 ENCOUNTER — Other Ambulatory Visit: Payer: Self-pay

## 2021-09-04 ENCOUNTER — Inpatient Hospital Stay (HOSPITAL_COMMUNITY): Payer: Medicaid Other | Attending: Hematology

## 2021-09-04 ENCOUNTER — Ambulatory Visit (HOSPITAL_COMMUNITY)
Admission: RE | Admit: 2021-09-04 | Discharge: 2021-09-04 | Disposition: A | Discharge status: Home / Self Care | Payer: Medicaid Other | Source: Ambulatory Visit | Attending: Hematology | Admitting: Hematology

## 2021-09-04 DIAGNOSIS — C7802 Secondary malignant neoplasm of left lung: Secondary | ICD-10-CM | POA: Insufficient documentation

## 2021-09-04 DIAGNOSIS — C189 Malignant neoplasm of colon, unspecified: Secondary | ICD-10-CM | POA: Insufficient documentation

## 2021-09-04 DIAGNOSIS — C7801 Secondary malignant neoplasm of right lung: Secondary | ICD-10-CM | POA: Diagnosis not present

## 2021-09-04 DIAGNOSIS — C187 Malignant neoplasm of sigmoid colon: Secondary | ICD-10-CM | POA: Diagnosis present

## 2021-09-04 DIAGNOSIS — C787 Secondary malignant neoplasm of liver and intrahepatic bile duct: Secondary | ICD-10-CM | POA: Insufficient documentation

## 2021-09-04 LAB — CBC WITH DIFFERENTIAL/PLATELET
Abs Immature Granulocytes: 0.05 10*3/uL (ref 0.00–0.07)
Basophils Absolute: 0.1 10*3/uL (ref 0.0–0.1)
Basophils Relative: 1 %
Eosinophils Absolute: 0.8 10*3/uL — ABNORMAL HIGH (ref 0.0–0.5)
Eosinophils Relative: 8 %
HCT: 30.3 % — ABNORMAL LOW (ref 39.0–52.0)
Hemoglobin: 10.2 g/dL — ABNORMAL LOW (ref 13.0–17.0)
Immature Granulocytes: 1 %
Lymphocytes Relative: 17 %
Lymphs Abs: 1.6 10*3/uL (ref 0.7–4.0)
MCH: 31.5 pg (ref 26.0–34.0)
MCHC: 33.7 g/dL (ref 30.0–36.0)
MCV: 93.5 fL (ref 80.0–100.0)
Monocytes Absolute: 0.8 10*3/uL (ref 0.1–1.0)
Monocytes Relative: 8 %
Neutro Abs: 6.1 10*3/uL (ref 1.7–7.7)
Neutrophils Relative %: 65 %
Platelets: 291 10*3/uL (ref 150–400)
RBC: 3.24 MIL/uL — ABNORMAL LOW (ref 4.22–5.81)
RDW: 15.2 % (ref 11.5–15.5)
WBC: 9.5 10*3/uL (ref 4.0–10.5)
nRBC: 0 % (ref 0.0–0.2)

## 2021-09-04 LAB — COMPREHENSIVE METABOLIC PANEL
ALT: 33 U/L (ref 0–44)
AST: 63 U/L — ABNORMAL HIGH (ref 15–41)
Albumin: 2.9 g/dL — ABNORMAL LOW (ref 3.5–5.0)
Alkaline Phosphatase: 499 U/L — ABNORMAL HIGH (ref 38–126)
Anion gap: 6 (ref 5–15)
BUN: 25 mg/dL — ABNORMAL HIGH (ref 6–20)
CO2: 26 mmol/L (ref 22–32)
Calcium: 8.3 mg/dL — ABNORMAL LOW (ref 8.9–10.3)
Chloride: 98 mmol/L (ref 98–111)
Creatinine, Ser: 0.9 mg/dL (ref 0.61–1.24)
GFR, Estimated: >60 mL/min (ref >60)
Glucose, Bld: 119 mg/dL — ABNORMAL HIGH (ref 70–99)
Potassium: 4.1 mmol/L (ref 3.5–5.1)
Sodium: 130 mmol/L — ABNORMAL LOW (ref 135–145)
Total Bilirubin: 0.7 mg/dL (ref 0.3–1.2)
Total Protein: 6.1 g/dL — ABNORMAL LOW (ref 6.5–8.1)

## 2021-09-04 LAB — MAGNESIUM: Magnesium: 2 mg/dL (ref 1.7–2.4)

## 2021-09-04 MED ORDER — HEPARIN SOD (PORK) LOCK FLUSH 100 UNIT/ML IV SOLN
500.0000 [IU] | Freq: Once | INTRAVENOUS | Status: AC
  Administered 2021-09-04: 500 [IU] via INTRAVENOUS

## 2021-09-04 MED ORDER — IOHEXOL 300 MG/ML  SOLN
100.0000 mL | Freq: Once | INTRAMUSCULAR | Status: AC | PRN
  Administered 2021-09-04: 100 mL via INTRAVENOUS

## 2021-09-04 MED ORDER — SODIUM CHLORIDE 0.9% FLUSH
10.0000 mL | Freq: Once | INTRAVENOUS | Status: AC
Start: 2021-09-04 — End: 2021-09-04
  Administered 2021-09-04: 10 mL via INTRAVENOUS

## 2021-09-04 NOTE — Progress Notes (Signed)
Patients port flushed without difficulty.  Good blood return noted with no bruising or swelling noted at site.  Band aid applied.  VSS with discharge and left in satisfactory condition with no s/s of distress noted.   

## 2021-09-04 NOTE — Patient Instructions (Signed)
[facility]  Discharge Instructions: ?Thank you for choosing [facility] to provide your oncology and hematology care.  ?If you have a lab appointment with the Cancer Center, please come in thru the Main Entrance and check in at the main information desk. ? ?Wear comfortable clothing and clothing appropriate for easy access to any Portacath or PICC line.  ? ?We strive to give you quality time with your provider. You may need to reschedule your appointment if you arrive late (15 or more minutes).  Arriving late affects you and other patients whose appointments are after yours.  Also, if you miss three or more appointments without notifying the office, you may be dismissed from the clinic at the provider?s discretion.    ?  ?For prescription refill requests, have your pharmacy contact our office and allow 72 hours for refills to be completed.   ? ?Today you received the following chemotherapy and/or immunotherapy agents Port flush    ?  ?To help prevent nausea and vomiting after your treatment, we encourage you to take your nausea medication as directed. ? ?BELOW ARE SYMPTOMS THAT SHOULD BE REPORTED IMMEDIATELY: ?*FEVER GREATER THAN 100.4 F (38 ?C) OR HIGHER ?*CHILLS OR SWEATING ?*NAUSEA AND VOMITING THAT IS NOT CONTROLLED WITH YOUR NAUSEA MEDICATION ?*UNUSUAL SHORTNESS OF BREATH ?*UNUSUAL BRUISING OR BLEEDING ?*URINARY PROBLEMS (pain or burning when urinating, or frequent urination) ?*BOWEL PROBLEMS (unusual diarrhea, constipation, pain near the anus) ?TENDERNESS IN MOUTH AND THROAT WITH OR WITHOUT PRESENCE OF ULCERS (sore throat, sores in mouth, or a toothache) ?UNUSUAL RASH, SWELLING OR PAIN  ?UNUSUAL VAGINAL DISCHARGE OR ITCHING  ? ?Items with * indicate a potential emergency and should be followed up as soon as possible or go to the Emergency Department if any problems should occur. ? ?Please show the CHEMOTHERAPY ALERT CARD or IMMUNOTHERAPY ALERT CARD at check-in to the Emergency  Department and triage nurse. ? ?Should you have questions after your visit or need to cancel or reschedule your appointment, please contact [facility] [phone]  and follow the prompts.  Office hours are 8:00 a.m. to 4:30 p.m. Monday - Friday. Please note that voicemails left after 4:00 p.m. may not be returned until the following business day.  We are closed weekends and major holidays. You have access to a nurse at all times for urgent questions. Please call the main number to the clinic [phone] and follow the prompts. ? ?For any non-urgent questions, you may also contact your provider using MyChart. We now offer e-Visits for anyone 18 and older to request care online for non-urgent symptoms. For details visit [email or web address]. ?  ?Also download the MyChart app! Go to the app store, search "MyChart", open the app, select [facility], and log in with your MyChart username and password. ? ?Due to Covid, a mask is required upon entering the hospital/clinic. If you do not have a mask, one will be given to you upon arrival. For doctor visits, patients may have 1 support person aged 18 or older with them. For treatment visits, patients cannot have anyone with them due to current Covid guidelines and our immunocompromised population.  ?

## 2021-09-05 ENCOUNTER — Ambulatory Visit (HOSPITAL_COMMUNITY): Payer: Medicaid Other | Admitting: Physical Therapy

## 2021-09-05 DIAGNOSIS — S81801S Unspecified open wound, right lower leg, sequela: Secondary | ICD-10-CM

## 2021-09-05 DIAGNOSIS — S91301S Unspecified open wound, right foot, sequela: Secondary | ICD-10-CM

## 2021-09-05 DIAGNOSIS — R600 Localized edema: Secondary | ICD-10-CM

## 2021-09-05 DIAGNOSIS — R262 Difficulty in walking, not elsewhere classified: Secondary | ICD-10-CM

## 2021-09-05 LAB — CEA: CEA: 137 ng/mL — ABNORMAL HIGH (ref 0.0–4.7)

## 2021-09-05 NOTE — Therapy (Signed)
[place], [place], [address] Phone: [phone]   Fax:  [phone]  Wound Care Therapy  Patient Details  Name: Henry Johns MRN: [id number] Date of Birth: 1961/01/05 Referring Provider (PT): Derek Jack   Encounter Date: 09/05/2021   PT End of Session - 09/05/21 1519     Visit Number 19    Number of Visits 24    Date for PT Re-Evaluation 09/22/21    Authorization Type [employer] medicaid requested more visits.    Progress Note Due on Visit 24    PT Start Time 1440    PT Stop Time 1510    PT Time Calculation (min) 30 min    Activity Tolerance Patient tolerated treatment well    Behavior During Therapy WFL for tasks assessed/performed             Past Medical History:  Diagnosis Date   Colon cancer ([place])    Diabetes mellitus without complication ([place])    Hypertension     Past Surgical History:  Procedure Laterality Date   BIOPSY  01/13/2021   Procedure: BIOPSY;  Surgeon: Harvel Quale, MD;  [facility];  Service: Gastroenterology;;   CATARACT EXTRACTION Right    COLONOSCOPY WITH PROPOFOL N/A 01/13/2021   Procedure: COLONOSCOPY WITH PROPOFOL;  Surgeon: Harvel Quale, MD;  [facility];  Service: Gastroenterology;  Laterality: N/A;  11:05   ESOPHAGOGASTRODUODENOSCOPY (EGD) WITH PROPOFOL N/A 01/13/2021   Procedure: ESOPHAGOGASTRODUODENOSCOPY (EGD) WITH PROPOFOL;  Surgeon: Harvel Quale, MD;  [facility];  Service: Gastroenterology;  Laterality: N/A;   IR IMAGING GUIDED PORT INSERTION  01/24/2021   LIVER BIOPSY  01/10/2021   U/S guided   POLYPECTOMY  01/13/2021   Procedure: POLYPECTOMY;  Surgeon: Harvel Quale, MD;  [facility];  Service: Gastroenterology;;    There were no vitals filed for this visit.               Wound Therapy - 09/05/21 0001     Subjective PT states that he is not  having any pain in his foot.    Patient and Family Stated Goals wound to heal    Date of Onset 06/14/21    Prior Treatments ointment, MD dressing    Pain Score 0-No pain    Evaluation and Treatment Procedures Explained to Patient/Family Yes    Evaluation and Treatment Procedures agreed to    Wound Properties Date First Assessed: 08/15/21 Time First Assessed: 0100 Wound Type: Other (Comment) Location: Foot Location Orientation: Anterior;Right;Lateral Present on Admission: Yes   Wound Image Images linked: 1    Dressing Type Silver hydrofiber    Dressing Changed Changed    Dressing Status Old drainage    Dressing Change Frequency PRN    Site / Wound Assessment Granulation tissue    % Wound base Red or Granulating 90%    % Wound base Yellow/Fibrinous Exudate 10%    Peri-wound Assessment Intact    Wound Length (cm) 0.8 cm    Wound Width (cm) 0.6 cm    Wound Surface Area (cm^2) 0.48 cm^2    Drainage Amount Minimal    Drainage Description Serous    Treatment Cleansed;Debridement (Selective)    Wound Properties Date First Assessed: 06/28/21 Time First Assessed: 1550 Wound Type: Other (Comment) Location: Foot Location Orientation: Right Wound Description (Comments): dorsal wound Present on Admission: Yes   Wound Image --   see above  Dressing Type Silver hydrofiber    Dressing Changed Changed    Dressing Status Old drainage    Dressing Change Frequency PRN    Site / Wound Assessment Granulation tissue    % Wound base Red or Granulating 90%    % Wound base Yellow/Fibrinous Exudate 10%    Drainage Amount Minimal    Drainage Description Serosanguineous    Treatment Cleansed;Debridement (Selective)    Selective Debridement - Location wound bed    Selective Debridement - Tools Used Forceps;Scissors    Selective Debridement - Tissue Removed slough, biofilm.    Wound Therapy - Clinical Statement Wound continues to be closing.  No edema present on LE.  Therapist trialed dressing with hydrogel,  4x4 and medipore tape only.    Wound Therapy - Functional Problem List Comorbidity 1;Fitness    Factors Delaying/Impairing Wound Healing Other (comment)   colon cancer and active chemo   Wound Therapy - Frequency 2X / week    Wound Plan If wound continues to stay granulated pt may be ready for discharge with self care next week.  Will assess and ensure biofilm and slough do not return.    Dressing  hydrogel, 4x4 and medipore tape.                       PT Short Term Goals - 09/05/21 1519       PT SHORT TERM GOAL #1   Title PT Rt dorsal wound wound to be 70% granulated    Time 3    Period Weeks    Status Achieved    Target Date 07/19/21      PT SHORT TERM GOAL #2   Title Pt lateral wounds on Rt LE to be healed.    Time 3    Period Weeks    Status Achieved      PT SHORT TERM GOAL #3   Title Edema to be minimal and pt to have been measured for compression garments.    Time 3    Period Weeks    Status Achieved               PT Long Term Goals - 09/05/21 1520       PT LONG TERM GOAL #1   Title Wound on dorsal aspect of Rt foot to be healed    Status On-going    Target Date 08/09/21                   Plan - 09/05/21 1519     Clinical Impression Statement see above    Personal Factors and Comorbidities Comorbidity 1;Fitness    Comorbidities colon cancer with active chemo    Examination-Activity Limitations Bathing;Locomotion Level;Hygiene/Grooming;Dressing    Examination-Participation Restrictions Community Activity;Shop    Stability/Clinical Decision Making Evolving/Moderate complexity    Rehab Potential Good    PT Frequency 2x / week    PT Duration 6 weeks    PT Treatment/Interventions Other (comment);Compression bandaging;Patient/family education   debridement, compression dressing   PT Next Visit Plan Continue with debridement of wound.    PT Home Exercise Plan keep dressing dry    Consulted and Agree with Plan of Care Patient              Patient will benefit from skilled therapeutic intervention in order to improve the following deficits and impairments:  Decreased skin integrity, Increased edema, Difficulty walking  Visit Diagnosis: Open wound of foot, right, sequela  Multiple open wounds of lower leg, right, sequela  Difficulty in walking, not elsewhere classified  Localized edema     Problem List Patient Active Problem List   Diagnosis Date Noted   Colitis 06/01/2021   Hypokalemia 06/01/2021   Hyperlipidemia 06/01/2021   Chronic pain 06/01/2021   Metastasis to liver ([place]) 01/04/2021   Colon cancer metastasized to liver ([place]) 01/04/2021   Other constipation 01/04/2021   Rectal bleeding 01/04/2021   Nausea without vomiting 01/04/2021   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

## 2021-09-06 ENCOUNTER — Encounter (HOSPITAL_COMMUNITY): Payer: Self-pay

## 2021-09-06 ENCOUNTER — Ambulatory Visit (HOSPITAL_COMMUNITY)
Admission: RE | Admit: 2021-09-06 | Discharge: 2021-09-06 | Disposition: A | Discharge status: Home / Self Care | Payer: Medicaid Other | Source: Ambulatory Visit | Attending: Hematology | Admitting: Hematology

## 2021-09-06 ENCOUNTER — Other Ambulatory Visit: Payer: Self-pay

## 2021-09-06 ENCOUNTER — Ambulatory Visit (HOSPITAL_COMMUNITY): Payer: Medicaid Other

## 2021-09-06 DIAGNOSIS — C787 Secondary malignant neoplasm of liver and intrahepatic bile duct: Secondary | ICD-10-CM | POA: Diagnosis present

## 2021-09-06 DIAGNOSIS — C189 Malignant neoplasm of colon, unspecified: Secondary | ICD-10-CM | POA: Insufficient documentation

## 2021-09-06 DIAGNOSIS — R180 Malignant ascites: Secondary | ICD-10-CM | POA: Insufficient documentation

## 2021-09-06 MED ORDER — ALBUMIN HUMAN 25 % IV SOLN
INTRAVENOUS | Status: AC
  Administered 2021-09-06: 50 g via INTRAVENOUS
  Filled 2021-09-06: qty 200

## 2021-09-06 MED ORDER — ALBUMIN HUMAN 25 % IV SOLN: 50.0000 g | Freq: Once | INTRAVENOUS | Status: AC

## 2021-09-06 MED ORDER — SODIUM CHLORIDE FLUSH 0.9 % IV SOLN
INTRAVENOUS | Status: AC
  Administered 2021-09-06: 10 mL
  Filled 2021-09-06: qty 10

## 2021-09-06 NOTE — Progress Notes (Signed)
PT tolerated left sided paracentesis procedure and 50G of IV albumin well today and 5.6 Liters of clear yellow fluid removed. PT verbalized understanding of discharge instructions and left via wheelchair with wife with no acute distress noted.  ?

## 2021-09-06 NOTE — Procedures (Signed)
? ?  LLQ US guided paracentesis ? ?5.6 L yellow fluid ?No labs sent per MD ? ?Tolerated well ? ?Ebl: none ?

## 2021-09-06 NOTE — Progress Notes (Signed)
PT states "I tripped over my cane in the waiting area and fell to both knees and caught myself with my hands." Patient's fall witnessed by Ultrasound [name]. RN assessment done at same time of fall (1113) and pt denies any pain or injury. Pt denied further evaluation in Emergency department and wife present at time of fall [name]). PT agrees to use a wheelchair within the hospital setting from now on due to generalized weakness. Dr. Delton Coombes made aware of patient's fall at 1140 and no new orders received.  ?

## 2021-09-07 ENCOUNTER — Ambulatory Visit (HOSPITAL_COMMUNITY): Payer: Medicaid Other | Attending: Hematology

## 2021-09-07 ENCOUNTER — Encounter (HOSPITAL_COMMUNITY): Payer: Self-pay

## 2021-09-07 ENCOUNTER — Other Ambulatory Visit (HOSPITAL_COMMUNITY): Payer: Self-pay | Admitting: *Deleted

## 2021-09-07 DIAGNOSIS — S81801S Unspecified open wound, right lower leg, sequela: Secondary | ICD-10-CM | POA: Insufficient documentation

## 2021-09-07 DIAGNOSIS — R262 Difficulty in walking, not elsewhere classified: Secondary | ICD-10-CM | POA: Diagnosis present

## 2021-09-07 DIAGNOSIS — S91301S Unspecified open wound, right foot, sequela: Secondary | ICD-10-CM | POA: Diagnosis not present

## 2021-09-07 DIAGNOSIS — R600 Localized edema: Secondary | ICD-10-CM | POA: Diagnosis present

## 2021-09-07 MED ORDER — MIDODRINE HCL 10 MG PO TABS: 10.0000 mg | ORAL_TABLET | Freq: Three times a day (TID) | ORAL | 0 refills | Status: DC

## 2021-09-07 NOTE — Therapy (Addendum)
[facility] [address] [place], [place], [address] Phone: [phone]   Fax:  [phone]  Wound Care Therapy Progress Note Reporting Period 08/01/21 to 09/08/21  See note below for Objective Data and Assessment of Progress/Goals.     Patient Details  Name: Henry Johns MRN: [id number] Date of Birth: [birth year] Referring Provider (PT): Derek Jack   Encounter Date: 09/07/2021   PT End of Session - 09/07/21 1613     Visit Number 20    Number of Visits 24    Date for PT Re-Evaluation 09/22/21    Authorization Type [employer] medicaid requested more visits.    Progress Note Due on Visit 24    PT Start Time 1530    PT Stop Time 1553    PT Time Calculation (min) 23 min    Activity Tolerance Patient tolerated treatment well    Behavior During Therapy WFL for tasks assessed/performed             Past Medical History:  Diagnosis Date   Colon cancer ([place])    Diabetes mellitus without complication ([place])    Hypertension     Past Surgical History:  Procedure Laterality Date   BIOPSY  01/13/2021   Procedure: BIOPSY;  Surgeon: Harvel Quale, MD;  [facility];  Service: Gastroenterology;;   CATARACT EXTRACTION Right    COLONOSCOPY WITH PROPOFOL N/A 01/13/2021   Procedure: COLONOSCOPY WITH PROPOFOL;  Surgeon: Harvel Quale, MD;  [facility];  Service: Gastroenterology;  Laterality: N/A;  11:05   ESOPHAGOGASTRODUODENOSCOPY (EGD) WITH PROPOFOL N/A 01/13/2021   Procedure: ESOPHAGOGASTRODUODENOSCOPY (EGD) WITH PROPOFOL;  Surgeon: Harvel Quale, MD;  [facility];  Service: Gastroenterology;  Laterality: N/A;   IR IMAGING GUIDED PORT INSERTION  01/24/2021   LIVER BIOPSY  01/10/2021   U/S guided   POLYPECTOMY  01/13/2021   Procedure: POLYPECTOMY;  Surgeon: Harvel Quale, MD;  [facility];  Service: Gastroenterology;;    There were no  vitals filed for this visit.    Subjective Assessment - 09/07/21 1604     Subjective Pt requested wheelchair at entrance.  Reports he passes out due to blood pressure and fell yesterday at [facility].  Instructed not to bend forward, unable to put on shoes without assistance today.    Currently in Pain? No/denies                       Wound Therapy - 09/07/21 0001     Subjective Pt requested wheelchair at entrance.  Reports he passes out due to blood pressure and fell yesterday at [facility].  Instructed not to bend forward, unable to put on shoes without assistance today.    Patient and Family Stated Goals wound to heal    Date of Onset 06/14/21    Prior Treatments ointment, MD dressing    Pain Scale 0-10    Pain Score 0-No pain    Evaluation and Treatment Procedures Explained to Patient/Family Yes    Evaluation and Treatment Procedures agreed to    Wound Properties Date First Assessed: 08/15/21 Time First Assessed: 0100 Wound Type: Other (Comment) Location: Foot Location Orientation: Anterior;Right;Lateral Present on Admission: Yes   Wound Image Images linked: 1    Dressing Type Hydrogel    Dressing Changed Changed    Dressing Status Old drainage    Dressing Change Frequency PRN    Site / Wound Assessment  Granulation tissue    % Wound base Red or Granulating 90%  was 30%   % Wound base Yellow/Fibrinous Exudate 10%    Peri-wound Assessment Intact    Drainage Amount Minimal    Drainage Description Serous    Treatment Cleansed;Debridement (Selective)    Wound Properties Date First Assessed: 06/28/21 Time First Assessed: 1550 Wound Type: Other (Comment) Location: Foot Location Orientation: Right Wound Description (Comments): dorsal wound Present on Admission: Yes   Dressing Type Hydrogel    Dressing Changed Changed    Dressing Status Old drainage    Dressing Change Frequency PRN    Site / Wound Assessment Granulation tissue    % Wound base Red or Granulating 90%  was  30%   % Wound base Yellow/Fibrinous Exudate 10%    Wound Length (cm) 3 cm  was 4.5   Wound Width (cm) 2 cm  was 5   Wound Depth (cm) 0.2 cm    Wound Volume (cm^3) 1.2 cm^3    Wound Surface Area (cm^2) 6 cm^2    Margins Attached edges (approximated)    Drainage Amount Minimal    Drainage Description Serosanguineous    Treatment Cleansed;Debridement (Selective)    Selective Debridement - Location wound bed    Selective Debridement - Tools Used Forceps;Scalpel    Selective Debridement - Tissue Removed slough, biofilm.    Wound Therapy - Clinical Statement Measurements made with overall reduction in size and improved granulation tissues following selective debridement for remvoal of biofilm and slough from wound bed.  No edema present.  Continued with hydrogel, 4x4, extra ABD and medipore tape.  Did add the ABD pad to address drainage that did soak through sock.  Pt instructed not be bend forward due to BP issues, does continue to have slough required selective debridement, recommend continued skilled intervention.    Wound Therapy - Functional Problem List Comorbidity 1;Fitness    Factors Delaying/Impairing Wound Healing Other (comment)   colon cancer and active chemo   Wound Therapy - Frequency 2X / week    Wound Plan If wound continues to stay granulated pt may be ready for discharge with self care next week.  Will assess and ensure biofilm and slough do not return.    Dressing  hydrogel, 4x4, ABD pad and medipore tape.                       PT Short Term Goals - 09/07/21 1613       PT SHORT TERM GOAL #1   Title PT Rt dorsal wound wound to be 70% granulated    Status Achieved      PT SHORT TERM GOAL #2   Title Pt lateral wounds on Rt LE to be healed.    Status On-going      PT SHORT TERM GOAL #3   Title Edema to be minimal and pt to have been measured for compression garments.    Baseline 3/2: No edema present               PT Long Term Goals - 09/07/21 1614        PT LONG TERM GOAL #1   Title Wound on dorsal aspect of Rt foot to be healed    Status On-going                    Patient will benefit from skilled therapeutic intervention in order to improve the following deficits and impairments:  Visit Diagnosis: Open wound of foot, right, sequela  Difficulty in walking, not elsewhere classified  Localized edema     Problem List Patient Active Problem List   Diagnosis Date Noted   Colitis 06/01/2021   Hypokalemia 06/01/2021   Hyperlipidemia 06/01/2021   Chronic pain 06/01/2021   Metastasis to liver ([place]) 01/04/2021   Colon cancer metastasized to liver ([place]) 01/04/2021   Other constipation 01/04/2021   Rectal bleeding 01/04/2021   Nausea without vomiting 01/04/2021   Cancer associated pain 01/04/2021   Diabetes mellitus with neuropathy [facility])    Ihor Austin, LPTA/CLT; CBIS [place], PT CLT [phone]  09/07/2021, 4:15 PM  [facility] [address] [place], [place], [address] Phone: [phone]   Fax:  [phone]  Name: Henry Johns MRN: [id number] Date of Birth: 21-Apr-1961

## 2021-09-08 ENCOUNTER — Observation Stay (HOSPITAL_COMMUNITY)
Admission: EM | Admit: 2021-09-08 | Discharge: 2021-09-10 | Disposition: A | Discharge status: Home / Self Care | Payer: Medicaid Other | Attending: Internal Medicine | Admitting: Internal Medicine

## 2021-09-08 ENCOUNTER — Other Ambulatory Visit: Payer: Self-pay

## 2021-09-08 DIAGNOSIS — E1140 Type 2 diabetes mellitus with diabetic neuropathy, unspecified: Secondary | ICD-10-CM | POA: Insufficient documentation

## 2021-09-08 DIAGNOSIS — C787 Secondary malignant neoplasm of liver and intrahepatic bile duct: Secondary | ICD-10-CM | POA: Diagnosis present

## 2021-09-08 DIAGNOSIS — I959 Hypotension, unspecified: Principal | ICD-10-CM | POA: Insufficient documentation

## 2021-09-08 DIAGNOSIS — Z20822 Contact with and (suspected) exposure to covid-19: Secondary | ICD-10-CM | POA: Insufficient documentation

## 2021-09-08 DIAGNOSIS — Z7984 Long term (current) use of oral hypoglycemic drugs: Secondary | ICD-10-CM | POA: Insufficient documentation

## 2021-09-08 DIAGNOSIS — Z85038 Personal history of other malignant neoplasm of large intestine: Secondary | ICD-10-CM | POA: Insufficient documentation

## 2021-09-08 DIAGNOSIS — Z79899 Other long term (current) drug therapy: Secondary | ICD-10-CM | POA: Insufficient documentation

## 2021-09-08 DIAGNOSIS — E785 Hyperlipidemia, unspecified: Secondary | ICD-10-CM | POA: Insufficient documentation

## 2021-09-08 DIAGNOSIS — Z794 Long term (current) use of insulin: Secondary | ICD-10-CM | POA: Insufficient documentation

## 2021-09-08 DIAGNOSIS — I10 Essential (primary) hypertension: Secondary | ICD-10-CM | POA: Insufficient documentation

## 2021-09-08 NOTE — ED Triage Notes (Addendum)
Pt reports he continues to take diuretics as prescribed. Pt reports he has to take a sleeping pill to sleep and is afraid to take it with his bp low. ?

## 2021-09-08 NOTE — ED Triage Notes (Addendum)
Pt reports he was prescribed mididrine by Dr. Tera Helper for low blood pressure. He took his first dose at 1500 yesterday. Pt reports SBP has been in 70s and 80s today at home. Pt has abdominal paracentesis weekly. ?

## 2021-09-09 ENCOUNTER — Encounter (HOSPITAL_COMMUNITY): Payer: Self-pay | Admitting: Family Medicine

## 2021-09-09 ENCOUNTER — Emergency Department (HOSPITAL_COMMUNITY): Payer: Medicaid Other

## 2021-09-09 ENCOUNTER — Other Ambulatory Visit: Payer: Self-pay

## 2021-09-09 DIAGNOSIS — C787 Secondary malignant neoplasm of liver and intrahepatic bile duct: Secondary | ICD-10-CM

## 2021-09-09 DIAGNOSIS — E1140 Type 2 diabetes mellitus with diabetic neuropathy, unspecified: Secondary | ICD-10-CM | POA: Diagnosis not present

## 2021-09-09 DIAGNOSIS — E785 Hyperlipidemia, unspecified: Secondary | ICD-10-CM

## 2021-09-09 DIAGNOSIS — I959 Hypotension, unspecified: Secondary | ICD-10-CM

## 2021-09-09 DIAGNOSIS — Z794 Long term (current) use of insulin: Secondary | ICD-10-CM

## 2021-09-09 LAB — CBC WITH DIFFERENTIAL/PLATELET
Abs Immature Granulocytes: 0.06 10*3/uL (ref 0.00–0.07)
Basophils Absolute: 0.1 10*3/uL (ref 0.0–0.1)
Basophils Relative: 1 %
Eosinophils Absolute: 0.7 10*3/uL — ABNORMAL HIGH (ref 0.0–0.5)
Eosinophils Relative: 8 %
HCT: 28.5 % — ABNORMAL LOW (ref 39.0–52.0)
Hemoglobin: 9.8 g/dL — ABNORMAL LOW (ref 13.0–17.0)
Immature Granulocytes: 1 %
Lymphocytes Relative: 19 %
Lymphs Abs: 1.7 10*3/uL (ref 0.7–4.0)
MCH: 31.7 pg (ref 26.0–34.0)
MCHC: 34.4 g/dL (ref 30.0–36.0)
MCV: 92.2 fL (ref 80.0–100.0)
Monocytes Absolute: 0.8 10*3/uL (ref 0.1–1.0)
Monocytes Relative: 9 %
Neutro Abs: 5.7 10*3/uL (ref 1.7–7.7)
Neutrophils Relative %: 62 %
Platelets: 256 10*3/uL (ref 150–400)
RBC: 3.09 MIL/uL — ABNORMAL LOW (ref 4.22–5.81)
RDW: 14.6 % (ref 11.5–15.5)
WBC: 9 10*3/uL (ref 4.0–10.5)
nRBC: 0 % (ref 0.0–0.2)

## 2021-09-09 LAB — COMPREHENSIVE METABOLIC PANEL
ALT: 24 U/L (ref 0–44)
AST: 48 U/L — ABNORMAL HIGH (ref 15–41)
Albumin: 2.9 g/dL — ABNORMAL LOW (ref 3.5–5.0)
Alkaline Phosphatase: 429 U/L — ABNORMAL HIGH (ref 38–126)
Anion gap: 7 (ref 5–15)
BUN: 26 mg/dL — ABNORMAL HIGH (ref 6–20)
CO2: 25 mmol/L (ref 22–32)
Calcium: 8.1 mg/dL — ABNORMAL LOW (ref 8.9–10.3)
Chloride: 97 mmol/L — ABNORMAL LOW (ref 98–111)
Creatinine, Ser: 0.98 mg/dL (ref 0.61–1.24)
GFR, Estimated: >60 mL/min (ref >60)
Glucose, Bld: 112 mg/dL — ABNORMAL HIGH (ref 70–99)
Potassium: 4.5 mmol/L (ref 3.5–5.1)
Sodium: 129 mmol/L — ABNORMAL LOW (ref 135–145)
Total Bilirubin: 0.5 mg/dL (ref 0.3–1.2)
Total Protein: 5.8 g/dL — ABNORMAL LOW (ref 6.5–8.1)

## 2021-09-09 LAB — HEMOGLOBIN A1C
Hgb A1c MFr Bld: 4.4 % — ABNORMAL LOW (ref 4.8–5.6)
Mean Plasma Glucose: 79.58 mg/dL

## 2021-09-09 LAB — LACTIC ACID, PLASMA
Lactic Acid, Venous: 1.1 mmol/L (ref 0.5–1.9)
Lactic Acid, Venous: 0.9 mmol/L (ref 0.5–1.9)

## 2021-09-09 LAB — TROPONIN I (HIGH SENSITIVITY)
Troponin I (High Sensitivity): 7 ng/L (ref <18)
Troponin I (High Sensitivity): 8 ng/L (ref <18)

## 2021-09-09 LAB — CBG MONITORING, ED
Glucose-Capillary: 122 mg/dL — ABNORMAL HIGH (ref 70–99)
Glucose-Capillary: 157 mg/dL — ABNORMAL HIGH (ref 70–99)
Glucose-Capillary: 155 mg/dL — ABNORMAL HIGH (ref 70–99)
Glucose-Capillary: 146 mg/dL — ABNORMAL HIGH (ref 70–99)

## 2021-09-09 LAB — TSH: TSH: 2.808 u[IU]/mL (ref 0.350–4.500)

## 2021-09-09 LAB — RESP PANEL BY RT-PCR (FLU A&B, COVID) ARPGX2
Influenza A by PCR: NEGATIVE
Influenza B by PCR: NEGATIVE
SARS Coronavirus 2 by RT PCR: NEGATIVE

## 2021-09-09 MED ORDER — CHLORHEXIDINE GLUCONATE CLOTH 2 % EX PADS
6.0000 | MEDICATED_PAD | Freq: Every day | CUTANEOUS | Status: DC
  Administered 2021-09-09: 6 via TOPICAL

## 2021-09-09 MED ORDER — TEMAZEPAM 7.5 MG PO CAPS: 30.0000 mg | ORAL_CAPSULE | Freq: Every evening | ORAL | Status: DC | PRN

## 2021-09-09 MED ORDER — TAMSULOSIN HCL 0.4 MG PO CAPS
0.4000 mg | ORAL_CAPSULE | Freq: Every day | ORAL | Status: DC
  Administered 2021-09-09: 0.4 mg via ORAL
  Filled 2021-09-09: qty 1

## 2021-09-09 MED ORDER — OXYCODONE HCL 5 MG PO TABS: 5.0000 mg | ORAL_TABLET | ORAL | Status: DC | PRN

## 2021-09-09 MED ORDER — SILVER SULFADIAZINE 1 % EX CREA
TOPICAL_CREAM | Freq: Every day | CUTANEOUS | Status: DC
  Administered 2021-09-09: 1 via TOPICAL
  Filled 2021-09-09: qty 50

## 2021-09-09 MED ORDER — HYDROCORTISONE SOD SUC (PF) 100 MG IJ SOLR
100.0000 mg | Freq: Two times a day (BID) | INTRAMUSCULAR | Status: DC
  Administered 2021-09-09 – 2021-09-10 (×3): 100 mg via INTRAVENOUS
  Filled 2021-09-09 (×3): qty 2

## 2021-09-09 MED ORDER — SIMVASTATIN 20 MG PO TABS
20.0000 mg | ORAL_TABLET | Freq: Every evening | ORAL | Status: DC
  Filled 2021-09-09: qty 2

## 2021-09-09 MED ORDER — ACETAMINOPHEN 325 MG PO TABS: 650.0000 mg | ORAL_TABLET | Freq: Four times a day (QID) | ORAL | Status: DC | PRN

## 2021-09-09 MED ORDER — INSULIN ASPART 100 UNIT/ML IJ SOLN
0.0000 [IU] | Freq: Three times a day (TID) | INTRAMUSCULAR | Status: DC
  Administered 2021-09-09 – 2021-09-10 (×4): 2 [IU] via SUBCUTANEOUS
  Filled 2021-09-09 (×2): qty 1

## 2021-09-09 MED ORDER — ALBUMIN HUMAN 25 % IV SOLN
12.5000 g | Freq: Four times a day (QID) | INTRAVENOUS | Status: AC
  Administered 2021-09-09 (×2): 12.5 g via INTRAVENOUS
  Filled 2021-09-09 (×3): qty 50

## 2021-09-09 MED ORDER — LORAZEPAM 0.5 MG PO TABS: 0.5000 mg | ORAL_TABLET | Freq: Two times a day (BID) | ORAL | Status: DC | PRN

## 2021-09-09 MED ORDER — HEPARIN SODIUM (PORCINE) 5000 UNIT/ML IJ SOLN
5000.0000 [IU] | Freq: Three times a day (TID) | INTRAMUSCULAR | Status: DC
  Administered 2021-09-09 – 2021-09-10 (×4): 5000 [IU] via SUBCUTANEOUS
  Filled 2021-09-09 (×4): qty 1

## 2021-09-09 MED ORDER — POLYETHYLENE GLYCOL 3350 17 G PO PACK: 17.0000 g | PACK | Freq: Every day | ORAL | Status: DC | PRN

## 2021-09-09 MED ORDER — MORPHINE SULFATE 15 MG PO TABS
15.0000 mg | ORAL_TABLET | Freq: Two times a day (BID) | ORAL | Status: DC | PRN
  Administered 2021-09-09: 15 mg via ORAL
  Filled 2021-09-09: qty 1

## 2021-09-09 MED ORDER — ACETAMINOPHEN 650 MG RE SUPP: 650.0000 mg | Freq: Four times a day (QID) | RECTAL | Status: DC | PRN

## 2021-09-09 MED ORDER — MIDODRINE HCL 5 MG PO TABS
10.0000 mg | ORAL_TABLET | Freq: Three times a day (TID) | ORAL | Status: DC
  Administered 2021-09-09 – 2021-09-10 (×4): 10 mg via ORAL
  Filled 2021-09-09 (×4): qty 2

## 2021-09-09 MED ORDER — ONDANSETRON HCL 4 MG/2ML IJ SOLN: 4.0000 mg | Freq: Four times a day (QID) | INTRAMUSCULAR | Status: DC | PRN

## 2021-09-09 MED ORDER — SODIUM CHLORIDE 0.9 % IV BOLUS
1000.0000 mL | Freq: Once | INTRAVENOUS | Status: AC
  Administered 2021-09-09: 1000 mL via INTRAVENOUS

## 2021-09-09 MED ORDER — TRAZODONE HCL 50 MG PO TABS
100.0000 mg | ORAL_TABLET | Freq: Every day | ORAL | Status: DC
  Administered 2021-09-09: 100 mg via ORAL
  Filled 2021-09-09: qty 2

## 2021-09-09 MED ORDER — INSULIN DETEMIR 100 UNIT/ML ~~LOC~~ SOLN
5.0000 [IU] | Freq: Every day | SUBCUTANEOUS | Status: DC
  Administered 2021-09-09: 5 [IU] via SUBCUTANEOUS
  Filled 2021-09-09 (×2): qty 0.05

## 2021-09-09 MED ORDER — INSULIN ASPART 100 UNIT/ML IJ SOLN: 0.0000 [IU] | Freq: Every day | INTRAMUSCULAR | Status: DC

## 2021-09-09 MED ORDER — ONDANSETRON HCL 4 MG PO TABS: 4.0000 mg | ORAL_TABLET | Freq: Four times a day (QID) | ORAL | Status: DC | PRN

## 2021-09-09 NOTE — ED Provider Notes (Signed)
[facility] EMERGENCY DEPARTMENT Provider Note   CSN: [id number] Arrival date & time: 09/08/21  2206     History  Chief Complaint  Patient presents with   Low Blood Pressure    Henry Johns is a 61 y.o. male.  Patient is a 61 year old male with past medical history of colon cancer with liver metastasis, diabetes.  Patient presenting today with complaints of low blood pressure.  Patient states that for the past several days, he has felt lightheaded when he changes position or bends over.  This causes him to nearly pass out.  He was seen by his oncologist, Dr. Delton Coombes and was noted to have a low blood pressure.  He was apparently prescribed midodrine and has been taking this for several days.  He has been checking his blood pressure at home and has gotten readings in the 40J and 81X systolic.  He denies to me he is having any diarrhea or vomiting.  He reports good oral intake and no decrease in urination.  He denies any black or bloody stools.  He denies any fevers or chills.  The history is provided by the patient.      Home Medications Prior to Admission medications   Medication Sig Start Date End Date Taking? Authorizing Provider  acetaminophen (TYLENOL) 500 MG tablet Take 1,000 mg by mouth every 6 (six) hours as needed for moderate pain.    [provider]  cephALEXin (KEFLEX) 500 MG capsule Take 500 mg by mouth 3 (three) times daily. 07/11/21   [provider]  fluorouracil CALGB 91478 2,400 mg/m2 in sodium chloride 0.9 % 150 mL Inject 2,400 mg/m2 into the vein over 48 hr.    [provider]  furosemide (LASIX) 40 MG tablet Take 1 tablet (40 mg total) by mouth daily as needed. 06/28/21   Derek Jack, MD  insulin NPH-regular Human (70-30) 100 UNIT/ML injection Inject 10-12 Units into the skin 2 (two) times daily.    [provider]  LEUCOVORIN CALCIUM IV Inject 400 mg/m2 into the vein every 14 (fourteen) days. 01/25/21   [provider]  lidocaine (XYLOCAINE) 5 % ointment Apply 1 application topically in the morning and at bedtime. Apply to both hands twice a day 04/20/21   Derek Jack, MD  Lidocaine, Anorectal, 5 % CREA Apply 1 application topically 2 (two) times daily as needed. 05/02/21   Derek Jack, MD  lidocaine-prilocaine (EMLA) cream Apply to affected area once 01/18/21   Heath Lark, MD  LORazepam (ATIVAN) 0.5 MG tablet Take 1 tablet by mouth twice daily as needed for anxiety 03/20/21   Derek Jack, MD  magnesium oxide (MAG-OX) 400 (240 Mg) MG tablet Take 1 tablet (400 mg total) by mouth 2 (two) times daily. 06/05/21   Manuella Ghazi, Pratik D, DO  midodrine (PROAMATINE) 10 MG tablet Take 1 tablet (10 mg total) by mouth 3 (three) times daily with meals. 09/07/21 10/07/21  Derek Jack, MD  morphine (MSIR) 15 MG tablet Take 1 tablet (15 mg total) by mouth 2 (two) times daily as needed for severe pain. 08/30/21   Derek Jack, MD  ondansetron (ZOFRAN) 8 MG tablet Take 1 tablet (8 mg total) by mouth every 8 (eight) hours as needed for nausea. 02/27/21   Derek Jack, MD  pentoxifylline (TRENTAL) 400 MG CR tablet Take by mouth. 04/05/21   [provider]  Potassium Chloride ER (K-TAB) 20 MEQ TBCR Take 20 mEq by mouth daily. 06/05/21   Heath Lark D, DO  prochlorperazine (COMPAZINE) 10 MG tablet Take 1 tablet by mouth every 6 (six) hours as needed. 01/04/21   [provider]  silver sulfADIAZINE (SILVADENE) 1 % cream Apply 1 application topically daily. 05/22/21   Derek Jack, MD  simvastatin (ZOCOR) 20 MG tablet Take 20 mg by mouth every evening. 01/23/21   [provider]  spironolactone (ALDACTONE) 25 MG tablet Take 1 tablet (25 mg total) by mouth daily. 06/28/21   Derek Jack, MD  spironolactone (ALDACTONE) 25 MG tablet Take 1 tablet (25 mg total) by mouth 2 (two) times daily. 07/19/21   Derek Jack, MD  tamsulosin (FLOMAX) 0.4  MG CAPS capsule Take 1 capsule (0.4 mg total) by mouth daily after supper. 07/19/21   Derek Jack, MD  temazepam (RESTORIL) 30 MG capsule Take 1 capsule (30 mg total) by mouth at bedtime as needed for sleep. 07/11/21   Derek Jack, MD  traZODone (DESYREL) 100 MG tablet Take 1 tablet (100 mg total) by mouth at bedtime. 07/19/21   Derek Jack, MD      Allergies    Patient has no known allergies.    Review of Systems   Review of Systems  All other systems reviewed and are negative.  Physical Exam Updated Vital Signs BP (!) 88/66    Pulse 88    Temp 98.1 F (36.7 C) (Oral)    Resp 12    SpO2 97%  Physical Exam Vitals and nursing note reviewed.  Constitutional:      General: He is not in acute distress.    Appearance: He is well-developed. He is not diaphoretic.  HENT:     Head: Normocephalic and atraumatic.     Mouth/Throat:     Mouth: Mucous membranes are moist.  Eyes:     Extraocular Movements: Extraocular movements intact.     Pupils: Pupils are equal, round, and reactive to light.  Cardiovascular:     Rate and Rhythm: Normal rate and regular rhythm.     Heart sounds: No murmur heard.   No friction rub.  Pulmonary:     Effort: Pulmonary effort is normal. No respiratory distress.     Breath sounds: Normal breath sounds. No wheezing or rales.  Abdominal:     General: Bowel sounds are normal. There is no distension.     Palpations: Abdomen is soft.     Tenderness: There is no abdominal tenderness.  Musculoskeletal:        General: Normal range of motion.     Cervical back: Normal range of motion and neck supple.  Skin:    General: Skin is warm and dry.  Neurological:     General: No focal deficit present.     Mental Status: He is alert and oriented to person, place, and time.     Cranial Nerves: No cranial nerve deficit.     Coordination: Coordination normal.    ED Results / Procedures / Treatments   Labs (all labs ordered are listed, but only  abnormal results are displayed) Labs Reviewed  CBC WITH DIFFERENTIAL/PLATELET  LACTIC ACID, PLASMA  LACTIC ACID, PLASMA  COMPREHENSIVE METABOLIC PANEL  TROPONIN I (HIGH SENSITIVITY)    EKG EKG Interpretation  Date/Time:  Saturday September 09 2021 01:33:53 EST Ventricular Rate:  81 PR Interval:  127 QRS Duration: 138 QT Interval:  404 QTC Calculation: 469 R Axis:   81 Text Interpretation: Sinus rhythm Right bundle branch block Probable inferior infarct, old Baseline wander in lead(s) I III aVL V3 No  significant change since 06/04/2022 Confirmed by Veryl Speak [id number]) on 09/09/2021 1:40:57 AM  Radiology DG Chest 2 View  Result Date: 09/09/2021 CLINICAL DATA:  Generalized weakness since starting new blood pressure medication yesterday. EXAM: CHEST - 2 VIEW COMPARISON:  June 10, 2021 FINDINGS: There is stable right-sided venous Port-A-Cath positioning. The heart size and mediastinal contours are within normal limits. A trace amount of atelectasis is seen within the left lung base. Both lungs are otherwise clear. The visualized skeletal structures are unremarkable. IMPRESSION: No active cardiopulmonary disease. Electronically Signed   By: Virgina Norfolk M.D.   On: 09/09/2021 01:23    Procedures Procedures  Continuous cardiac monitoring  Medications Ordered in ED Medications  sodium chloride 0.9 % bolus 1,000 mL (has no administration in time range)    ED Course/ Medical Decision Making/ A&P  This patient presents to the ED for concern of low blood pressure, this involves an extensive number of treatment options, and is a complaint that carries with it a high risk of complications and morbidity.  The differential diagnosis includes adrenal insufficiency, dehydration, sepsis, hypothyroidism   Co morbidities that complicate the patient evaluation  Colon cancer with liver metastasis   Additional history obtained:  No additional history or outside records needed   Lab  Tests:  I Ordered, and personally interpreted labs.  The pertinent results include: Unremarkable lactate, CBC, metabolic panel   Imaging Studies ordered:  I ordered imaging studies including chest ray I independently visualized and interpreted imaging which showed no acute process I agree with the radiologist interpretation   Cardiac Monitoring:  The patient was maintained on a cardiac monitor.  I personally viewed and interpreted the cardiac monitored which showed an underlying rhythm of: Sinus   Medicines ordered and prescription drug management:  No medications ordered with the exception of normal saline I have reviewed the patients home medicines and have made adjustments as needed   Test Considered:  None   Critical Interventions:  IV hydration   Consultations Obtained:  I requested consultation with the hospitalist, Dr. Clearence Ped,  and discussed lab and imaging findings as well as pertinent plan - they recommend: admission for further workup into hypotension   Problem List / ED Course:  Patient is a 61 year old male with past medical history of colon cancer with liver metastasis presenting with complaints of low blood pressure and near syncope when he bends over, then stands up.  Patient's blood pressures have been persistently in the lower 87F systolic, the etiology of which I am uncertain.  Patient appears well-hydrated and denies any recent symptoms that would make me feel as though he is dry.  He was given normal saline with no improvement in his blood pressure. Patient does not appear septic.  He is afebrile with no white count and normal lactate, I highly doubt sepsis. Other possibilities are renal insufficiency or hypothyroidism.  Studies will be added and patient admitted to the hospitalist service for further evaluation.    Social Determinants of Health:  None     Final Clinical Impression(s) / ED Diagnoses Final diagnoses:  None    Rx / DC  Orders ED Discharge Orders     None         Veryl Speak, MD 09/09/21 [phone]

## 2021-09-09 NOTE — Assessment & Plan Note (Addendum)
Continue statin. 

## 2021-09-09 NOTE — Assessment & Plan Note (Addendum)
-  Resume home hypoglycemic regimen ?-Patient advised not to skip meals and to maintain adequate nutrition and hydration. ?

## 2021-09-09 NOTE — Progress Notes (Signed)
Patient seen and examined.  Admitted after midnight secondary to lightheadedness, intermittent dizziness and orthostatic changes.  Patient with underlying history of colon cancer with liver metastasis and recent initiation of midodrine by oncology service to help lower blood pressure.  Patient reports no fever, no chest pain, no nausea, no vomiting.  Some decrease in his appetite and ongoing nonintentional weight loss reported.  He is a stable and in no acute distress currently; please refer to H&P written by Dr. Clearence Ped for further info/details on admission. ? ?Plan: ?-follow vital signs ?-continue gentle IVF resuscitation ?-continue midodrine and assess response to the use of solucortef. ?-stopping aldactone currently ?-follow vital signs and assess clinical progression ? ?Barton Dubois MD ?[phone] ? ?

## 2021-09-09 NOTE — ED Notes (Signed)
Put one pillow under right arm and one pillow behiind his back on the left  ?

## 2021-09-09 NOTE — ED Notes (Signed)
Report called to [place] ?

## 2021-09-09 NOTE — ED Notes (Signed)
Silvadene applied with non-stick dressing to right foot wound, wrapped in gauze  ?

## 2021-09-09 NOTE — H&P (Signed)
History and Physical    Patient: Henry Johns:[id number] DOB: [birth year] DOA: 09/08/2021 DOS: the patient was seen and examined on 09/09/2021 PCP: [facility]  Patient coming from: Home  Chief Complaint:  Chief Complaint  Patient presents with   Low Blood Pressure    HPI: Henry Johns is a 61 y.o. male with medical history significant of with history of colon cancer with mets to liver, diabetes mellitus type 2, hyperlipidemia, hypertension, presents to the ED with a chief complaint of hypotension.  Patient reports that every time he bends over he feels flushed and dizzy.  This been going on for at least 5 days.  He has had gradual, minimal improvement with midodrine that was started by Dr. Raliegh Ip.  He was told that if the midodrine did not work to come back into the ED.  Patient's blood pressures at home have been as low as 64P systolic.  They are mostly running 80s over 60s.  So he came into the ED.  He reports his symptoms are also triggered by changing posture from sitting to standing very quickly patient reports unintentional weight loss, but is not sure how much because of the fluid variations that he has after paracenteses.  He does feel like he is losing body mass.  Patient has no new pains today.  He has no new rashes today.  He does have a chronic wound on the dorsal aspect of his right foot that has been being treated with wound care.  Patient does report compliance with his Lasix and spironolactone every day.  Patient has no other complaints at this time.  Patient does not smoke, does not drink alcohol, does not use illicit drugs.  He is not vaccinated for COVID.  Patient is full code.  Review of Systems: As mentioned in the history of present illness. All other systems reviewed and are negative. Past Medical History:  Diagnosis Date   Colon cancer ([place])    Diabetes mellitus without complication ([place])    Hypertension    Past Surgical History:   Procedure Laterality Date   BIOPSY  01/13/2021   Procedure: BIOPSY;  Surgeon: Harvel Quale, MD;  Location: [facility];  Service: Gastroenterology;;   CATARACT EXTRACTION Right    COLONOSCOPY WITH PROPOFOL N/A 01/13/2021   Procedure: COLONOSCOPY WITH PROPOFOL;  Surgeon: Harvel Quale, MD;  Location: [facility];  Service: Gastroenterology;  Laterality: N/A;  11:05   ESOPHAGOGASTRODUODENOSCOPY (EGD) WITH PROPOFOL N/A 01/13/2021   Procedure: ESOPHAGOGASTRODUODENOSCOPY (EGD) WITH PROPOFOL;  Surgeon: Harvel Quale, MD;  Location: [facility];  Service: Gastroenterology;  Laterality: N/A;   IR IMAGING GUIDED PORT INSERTION  01/24/2021   LIVER BIOPSY  01/10/2021   U/S guided   POLYPECTOMY  01/13/2021   Procedure: POLYPECTOMY;  Surgeon: Harvel Quale, MD;  Location: [facility];  Service: Gastroenterology;;   Social History:  reports that he has never smoked. He has never used smokeless tobacco. He reports that he does not currently use alcohol. He reports that he does not currently use drugs.  No Known Allergies  Family History  Problem Relation Age of Onset   Cancer Father 50       prostate ca    Prior to Admission medications   Medication Sig Start Date End Date Taking? Authorizing Provider  acetaminophen (TYLENOL) 500 MG tablet Take 1,000 mg by mouth every 6 (six) hours as needed for moderate pain.    [provider]  cephALEXin (KEFLEX) 500 MG capsule Take 500 mg by mouth 3 (three) times daily. 07/11/21   [provider]  fluorouracil CALGB 32992 2,400 mg/m2 in sodium chloride 0.9 % 150 mL Inject 2,400 mg/m2 into the vein over 48 hr.    [provider]  furosemide (LASIX) 40 MG tablet Take 1 tablet (40 mg total) by mouth daily as needed. 06/28/21   Derek Jack, MD  insulin NPH-regular Human (70-30) 100 UNIT/ML injection Inject 10-12 Units into the skin 2 (two) times daily.    [provider]   LEUCOVORIN CALCIUM IV Inject 400 mg/m2 into the vein every 14 (fourteen) days. 01/25/21   [provider]  lidocaine (XYLOCAINE) 5 % ointment Apply 1 application topically in the morning and at bedtime. Apply to both hands twice a day 04/20/21   Derek Jack, MD  Lidocaine, Anorectal, 5 % CREA Apply 1 application topically 2 (two) times daily as needed. 05/02/21   Derek Jack, MD  lidocaine-prilocaine (EMLA) cream Apply to affected area once 01/18/21   Heath Lark, MD  LORazepam (ATIVAN) 0.5 MG tablet Take 1 tablet by mouth twice daily as needed for anxiety 03/20/21   Derek Jack, MD  magnesium oxide (MAG-OX) 400 (240 Mg) MG tablet Take 1 tablet (400 mg total) by mouth 2 (two) times daily. 06/05/21   Manuella Ghazi, Pratik D, DO  midodrine (PROAMATINE) 10 MG tablet Take 1 tablet (10 mg total) by mouth 3 (three) times daily with meals. 09/07/21 10/07/21  Derek Jack, MD  morphine (MSIR) 15 MG tablet Take 1 tablet (15 mg total) by mouth 2 (two) times daily as needed for severe pain. 08/30/21   Derek Jack, MD  ondansetron (ZOFRAN) 8 MG tablet Take 1 tablet (8 mg total) by mouth every 8 (eight) hours as needed for nausea. 02/27/21   Derek Jack, MD  pentoxifylline (TRENTAL) 400 MG CR tablet Take by mouth. 04/05/21   [provider]  Potassium Chloride ER (K-TAB) 20 MEQ TBCR Take 20 mEq by mouth daily. 06/05/21   Manuella Ghazi, Pratik D, DO  prochlorperazine (COMPAZINE) 10 MG tablet Take 1 tablet by mouth every 6 (six) hours as needed. 01/04/21   [provider]  silver sulfADIAZINE (SILVADENE) 1 % cream Apply 1 application topically daily. 05/22/21   Derek Jack, MD  simvastatin (ZOCOR) 20 MG tablet Take 20 mg by mouth every evening. 01/23/21   [provider]  spironolactone (ALDACTONE) 25 MG tablet Take 1 tablet (25 mg total) by mouth daily. 06/28/21   Derek Jack, MD  spironolactone (ALDACTONE) 25 MG tablet Take 1 tablet (25  mg total) by mouth 2 (two) times daily. 07/19/21   Derek Jack, MD  tamsulosin (FLOMAX) 0.4 MG CAPS capsule Take 1 capsule (0.4 mg total) by mouth daily after supper. 07/19/21   Derek Jack, MD  temazepam (RESTORIL) 30 MG capsule Take 1 capsule (30 mg total) by mouth at bedtime as needed for sleep. 07/11/21   Derek Jack, MD  traZODone (DESYREL) 100 MG tablet Take 1 tablet (100 mg total) by mouth at bedtime. 07/19/21   Derek Jack, MD    Physical Exam: Vitals:   09/09/21 0200 09/09/21 0400 09/09/21 0430 09/09/21 0500  BP: (!) 81/64 94/67 (!) 87/66 (!) 83/65  Pulse: 84 84 85 88  Resp: 18 17 15 18   Temp:      TempSrc:      SpO2: 99% 99% 100% 100%   1.  General: Patient lying supine in bed,  no acute distress   2. Psychiatric: Alert and oriented x 3, mood and behavior normal for situation, pleasant and cooperative with exam   3. Neurologic: Speech and language are normal, face is symmetric, moves all 4 extremities voluntarily, at baseline without acute deficits on limited exam   4. HEENMT:  Head is atraumatic, normocephalic, pupils reactive to light, neck is cachectic, trachea is midline, mucous membranes are moist   5. Respiratory : Lungs are clear to auscultation bilaterally without wheezing, rhonchi, rales, no cyanosis, no increase in work of breathing or accessory muscle use   6. Cardiovascular : Heart rate normal, rhythm is regular, no murmurs, rubs or gallops, peripheral edema present greater left than right, peripheral pulses palpated   7. Gastrointestinal:  Abdomen is soft, distended, nontender to palpation bowel sounds active, no masses or organomegaly palpated   8. Skin:  Dorsal aspect of right foot has half dollar sized wound with granulation tissue normal healing.   9.Musculoskeletal:  No acute deformities or trauma, no asymmetry in tone, peripheral edema present, peripheral pulses palpated, no tenderness to palpation in the  extremities   Data Reviewed: In the ED Temp 98.1, heart rate 79-90, respiratory rate 11-18, blood pressure 81/64-88/66, satting at 95-100% No leukocytosis with white blood cell count 9.0, hemoglobin 9.8, platelets 256 Slight hyponatremia at 129, otherwise chemistry is unremarkable Alk phos 429, albumin 2.9, AST 48, ALT 24 Lactic acid 1.1 Chest x-ray shows no active cardiopulmonary disease Trope 7 Last paracentesis was March 1 yielding 5.6 L EKG shows a heart rate of 80, sinus rhythm, QTc 4 and 69 with baseline wander and right bundle branch block 1 L normal saline given Second Trope pending, second lactic acid pending  Assessment and Plan: * Hypotension Likely multifactorial Decreased albumin could be contributing- 25 g albumin ordered With hyponatremia and aldosterone antagonist there is concern for adrenal insufficiency-start Solu-Cortef Continue midodrine that was started by Dr. Raliegh Ip Orthostatic vitals 1 L fluid given in the ED with no change in blood pressure Lactic acid 1.1-seems to be perfusing Continue to monitor  Hyperlipidemia Continue statin  Diabetes mellitus with neuropathy ([place]) Patient takes NPH at home Continue 8 units basal insulin Sliding scale coverage Monitor CBGs  Metastasis to liver ([facility]) Takes p.o. chemotherapy Follows with Dr. Delton Coombes Holding spironolactone in the setting of hypotension Recently had 5.6 L paracentesis Albumin is low 2.9 -replace albumin Continue to monitor  Chronic wound right foot Consult wound care Continue silver sulfadiazine with nonstick wrap for now Patient has no pain associated with wound Continue to monitor      Advance Care Planning:   Code Status: Prior full  Consults: None  Family Communication: Brother at bedside  Severity of Illness: The appropriate patient status for this patient is OBSERVATION. Observation status is judged to be reasonable and necessary in order to provide the required intensity of  service to ensure the patient's safety. The patient's presenting symptoms, physical exam findings, and initial radiographic and laboratory data in the context of their medical condition is felt to place them at decreased risk for further clinical deterioration. Furthermore, it is anticipated that the patient will be medically stable for discharge from the hospital within 2 midnights of admission.   Author: Rolla Plate, DO 09/09/2021 6:14 AM  For on call review [email or web address].

## 2021-09-09 NOTE — Assessment & Plan Note (Addendum)
-  multifactorial in the setting of hypoalbuminemia from underlying liver disease and colon cancer.  And also due to adrenal insufficiency. ?-Patient received treatment with albumin infusion and was also started on hydrocortisone with excellent response. ?-At discharge systolic blood pressure in the 100 is and a map of 68-70. ?-Patient was asymptomatic and feeling ready to go home. ?-Advised to maintain adequate nutrition/hydration and to continue using 3 times a day midodrine with PCP/oncology. ?-Patient spironolactone has been discontinued at time of discharge. ?-Okay to continue as needed Lasix for fluid overload status. ? ? ?

## 2021-09-09 NOTE — Assessment & Plan Note (Addendum)
-  Continue outpatient follow-up with Dr. Delton Coombes (oncology service) to further determine treatment and management.   ? ? ?

## 2021-09-09 NOTE — Consult Note (Signed)
WOC Nurse Consult Note: ?Reason for Consult:Chronic, healing wound to dorsal aspect of right foot. Patient receives treatment at the outpatient wound care center at [facility] is completed remotely following review of the patient's medical records including those from the physical therapists at the outpatient wound care center and photographs. ?Wound type: full thickness ?Pressure Injury POA: N/A ?Measurement: 2.2cm x 2.6cm x 0.2cm ?Wound bed:Red, granulating. See also photo uploaded to EMR on 09/07/21 ?Drainage (amount, consistency, odor) moderate serous ?Periwound:mild maceration ?Dressing procedure/placement/frequency: Patient's previous dressing was silver sulfadiazine cream however mild maceration is noted in the periwound. I will change to a silver hydrofiber for the same antimicrobial properties, but no periwound moisture as the product wicks vertically. Securement with a few turns of Kerlix roll gauze/paper tape with no tape placed onto intact skin is recommended.  ?Pressure injury prevention modalities such as a sacral prophylactic foam and the floatation of heels is recommended. ? ?[facility] nursing team will not follow, but will remain available to this patient, the nursing and medical teams.  Please re-consult if needed. ?Thanks, ?Maudie Flakes, MSN, RN, [place], [place], [facility]ON[facility], [place]  ?Pager# [phone]  ? ? ? ?  ?

## 2021-09-10 DIAGNOSIS — E785 Hyperlipidemia, unspecified: Secondary | ICD-10-CM | POA: Diagnosis not present

## 2021-09-10 DIAGNOSIS — Z794 Long term (current) use of insulin: Secondary | ICD-10-CM | POA: Diagnosis not present

## 2021-09-10 DIAGNOSIS — E1140 Type 2 diabetes mellitus with diabetic neuropathy, unspecified: Secondary | ICD-10-CM | POA: Diagnosis not present

## 2021-09-10 DIAGNOSIS — C787 Secondary malignant neoplasm of liver and intrahepatic bile duct: Secondary | ICD-10-CM | POA: Diagnosis not present

## 2021-09-10 LAB — CBC WITH DIFFERENTIAL/PLATELET
Abs Immature Granulocytes: 0.04 10*3/uL (ref 0.00–0.07)
Basophils Absolute: 0 10*3/uL (ref 0.0–0.1)
Basophils Relative: 0 %
Eosinophils Absolute: 0 10*3/uL (ref 0.0–0.5)
Eosinophils Relative: 0 %
HCT: 26.2 % — ABNORMAL LOW (ref 39.0–52.0)
Hemoglobin: 8.5 g/dL — ABNORMAL LOW (ref 13.0–17.0)
Immature Granulocytes: 1 %
Lymphocytes Relative: 12 %
Lymphs Abs: 0.8 10*3/uL (ref 0.7–4.0)
MCH: 30.2 pg (ref 26.0–34.0)
MCHC: 32.4 g/dL (ref 30.0–36.0)
MCV: 93.2 fL (ref 80.0–100.0)
Monocytes Absolute: 0.5 10*3/uL (ref 0.1–1.0)
Monocytes Relative: 8 %
Neutro Abs: 5.2 10*3/uL (ref 1.7–7.7)
Neutrophils Relative %: 79 %
Platelets: 235 10*3/uL (ref 150–400)
RBC: 2.81 MIL/uL — ABNORMAL LOW (ref 4.22–5.81)
RDW: 14.4 % (ref 11.5–15.5)
WBC: 6.5 10*3/uL (ref 4.0–10.5)
nRBC: 0 % (ref 0.0–0.2)

## 2021-09-10 LAB — COMPREHENSIVE METABOLIC PANEL
ALT: 19 U/L (ref 0–44)
AST: 34 U/L (ref 15–41)
Albumin: 2.8 g/dL — ABNORMAL LOW (ref 3.5–5.0)
Alkaline Phosphatase: 333 U/L — ABNORMAL HIGH (ref 38–126)
Anion gap: 6 (ref 5–15)
BUN: 24 mg/dL — ABNORMAL HIGH (ref 6–20)
CO2: 25 mmol/L (ref 22–32)
Calcium: 8.2 mg/dL — ABNORMAL LOW (ref 8.9–10.3)
Chloride: 99 mmol/L (ref 98–111)
Creatinine, Ser: 0.84 mg/dL (ref 0.61–1.24)
GFR, Estimated: >60 mL/min (ref >60)
Glucose, Bld: 167 mg/dL — ABNORMAL HIGH (ref 70–99)
Potassium: 4.5 mmol/L (ref 3.5–5.1)
Sodium: 130 mmol/L — ABNORMAL LOW (ref 135–145)
Total Bilirubin: 0.3 mg/dL (ref 0.3–1.2)
Total Protein: 5.4 g/dL — ABNORMAL LOW (ref 6.5–8.1)

## 2021-09-10 LAB — APTT: aPTT: 31 seconds (ref 24–36)

## 2021-09-10 LAB — PROTIME-INR
INR: 1.1 (ref 0.8–1.2)
Prothrombin Time: 14.5 seconds (ref 11.4–15.2)

## 2021-09-10 LAB — MAGNESIUM: Magnesium: 2.3 mg/dL (ref 1.7–2.4)

## 2021-09-10 LAB — GLUCOSE, CAPILLARY
Glucose-Capillary: 191 mg/dL — ABNORMAL HIGH (ref 70–99)
Glucose-Capillary: 196 mg/dL — ABNORMAL HIGH (ref 70–99)

## 2021-09-10 LAB — MRSA NEXT GEN BY PCR, NASAL: MRSA by PCR Next Gen: NOT DETECTED

## 2021-09-10 MED ORDER — HYDROCORTISONE 10 MG PO TABS: ORAL_TABLET | ORAL | 1 refills | Status: DC

## 2021-09-10 MED ORDER — HEPARIN SOD (PORK) LOCK FLUSH 100 UNIT/ML IV SOLN
500.0000 [IU] | Freq: Once | INTRAVENOUS | Status: AC
  Administered 2021-09-10: 500 [IU] via INTRAVENOUS
  Filled 2021-09-10: qty 5

## 2021-09-10 NOTE — Progress Notes (Signed)
Family stated patient would need to be discharged as soon as possible or wait until tomorrow. Orders were placed. IV access removed after flushing with heparin. Patient dressed and taken to car by wheel chair.  ?

## 2021-09-10 NOTE — Discharge Summary (Signed)
Hydrocortisone dosage as needed. Physician Discharge Summary   Patient: Henry Johns MRN: [id number] DOB: September 19, 1960  Admit date:     09/08/2021  Discharge date: 09/10/21  Discharge Physician: Barton Dubois   PCP: [facility]   Recommendations at discharge:  Repeat basic metabolic panel to follow to lites renal function. Reassess blood pressure and further adjust hydrocortisone dose as needed. Make sure patient continue to follow-up with oncology service as previously instructed. I will recommend goals of care discussion and advance care directives.  Discharge Diagnoses: Principal Problem:   Hypotension Active Problems:   Metastasis to liver ([place])   Diabetes mellitus with neuropathy ([place])   Hyperlipidemia Anxiety Depression Insomnia Chronic right foot wound  Hospital admission narrative course: As per H&P written by Dr. Clearence Ped On 09/09/2021 Henry Johns is a 61 y.o. male with medical history significant of with history of colon cancer with mets to liver, diabetes mellitus type 2, hyperlipidemia, hypertension, presents to the ED with a chief complaint of hypotension.  Patient reports that every time he bends over he feels flushed and dizzy.  This been going on for at least 5 days.  He has had gradual, minimal improvement with midodrine that was started by Dr. Raliegh Ip.  He was told that if the midodrine did not work to come back into the ED.  Patient's blood pressures at home have been as low as 37S systolic.  They are mostly running 80s over 60s.  So he came into the ED.  He reports his symptoms are also triggered by changing posture from sitting to standing very quickly patient reports unintentional weight loss, but is not sure how much because of the fluid variations that he has after paracenteses.  He does feel like he is losing body mass.  Patient has no new pains today.  He has no new rashes today.  He does have a chronic wound on the dorsal aspect of  his right foot that has been being treated with wound care.  Patient does report compliance with his Lasix and spironolactone every day.  Patient has no other complaints at this time.   Patient does not smoke, does not drink alcohol, does not use illicit drugs.  He is not vaccinated for COVID.  Patient is full code.  Assessment and Plan: * Hypotension -multifactorial in the setting of hypoalbuminemia from underlying liver disease and colon cancer.  And also due to adrenal insufficiency. -Patient received treatment with albumin infusion and was also started on hydrocortisone with excellent response. -At discharge systolic blood pressure in the 100 is and a map of 68-70. -Patient was asymptomatic and feeling ready to go home. -Advised to maintain adequate nutrition/hydration and to continue using 3 times a day midodrine with PCP/oncology. -Patient spironolactone has been discontinued at time of discharge. -Okay to continue as needed Lasix for fluid overload status.  Hyperlipidemia -Continue statin  Diabetes mellitus with neuropathy ([place]) -Resume home hypoglycemic regimen -Patient advised not to skip meals and to maintain adequate nutrition and hydration.  Metastasis to liver [facility]) -Continue outpatient follow-up with Dr. Delton Coombes (oncology service) to further determine treatment and management.    Anxiety/depression/insomnia -Continue as needed lorazepam and also daily use of trazodone and as needed nightly temazepam. -Sleep hygiene has been discussed with patient. -Overall mood is a stable and the patient demonstrated no suicidal ideation or hallucinations.  Right foot chronic wound -Continue wound care therapy/dressing and outpatient follow-up with wound care center. -No signs of superimposed  infection.  Consultants: None Procedures performed: See below for x-ray reports. Disposition: Discharged home with instruction to follow-up with PCP and oncology as an outpatient.  Diet  recommendation: Heart healthy modified carbohydrate diet.  DISCHARGE MEDICATION: Allergies as of 09/10/2021   No Known Allergies      Medication List     STOP taking these medications    spironolactone 25 MG tablet Commonly known as: Aldactone       TAKE these medications    acetaminophen 500 MG tablet Commonly known as: TYLENOL Take 1,000 mg by mouth every 6 (six) hours as needed for moderate pain.   cephALEXin 500 MG capsule Commonly known as: KEFLEX Take 500 mg by mouth 3 (three) times daily.   fluorouracil CALGB 17001 2,400 mg/m2 in sodium chloride 0.9 % 150 mL Inject 2,400 mg/m2 into the vein over 48 hr.   furosemide 40 MG tablet Commonly known as: LASIX Take 1 tablet (40 mg total) by mouth daily as needed.   hydrocortisone 10 MG tablet Commonly known as: CORTEF 20 mg (2 tablets) in the morning and 10 mg (1 tablet) in the evening.   insulin NPH-regular Human (70-30) 100 UNIT/ML injection Inject 10-12 Units into the skin 2 (two) times daily.   LEUCOVORIN CALCIUM IV Inject 400 mg/m2 into the vein every 14 (fourteen) days.   Lidocaine (Anorectal) 5 % Crea Apply 1 application topically 2 (two) times daily as needed.   lidocaine 5 % ointment Commonly known as: XYLOCAINE Apply 1 application topically in the morning and at bedtime. Apply to both hands twice a day   lidocaine-prilocaine cream Commonly known as: EMLA Apply to affected area once   LORazepam 0.5 MG tablet Commonly known as: ATIVAN Take 1 tablet by mouth twice daily as needed for anxiety   magnesium oxide 400 (240 Mg) MG tablet Commonly known as: MAG-OX Take 1 tablet (400 mg total) by mouth 2 (two) times daily.   midodrine 10 MG tablet Commonly known as: PROAMATINE Take 1 tablet (10 mg total) by mouth 3 (three) times daily with meals.   morphine 15 MG tablet Commonly known as: MSIR Take 1 tablet (15 mg total) by mouth 2 (two) times daily as needed for severe pain.   ondansetron 8 MG  tablet Commonly known as: ZOFRAN Take 1 tablet (8 mg total) by mouth every 8 (eight) hours as needed for nausea.   pentoxifylline 400 MG CR tablet Commonly known as: TRENTAL Take by mouth.   Potassium Chloride ER 20 MEQ Tbcr Commonly known as: K-Tab Take 20 mEq by mouth daily.   prochlorperazine 10 MG tablet Commonly known as: COMPAZINE Take 1 tablet by mouth every 6 (six) hours as needed.   silver sulfADIAZINE 1 % cream Commonly known as: SILVADENE Apply 1 application topically daily.   simvastatin 20 MG tablet Commonly known as: ZOCOR Take 20 mg by mouth every evening.   tamsulosin 0.4 MG Caps capsule Commonly known as: FLOMAX Take 1 capsule (0.4 mg total) by mouth daily after supper.   temazepam 30 MG capsule Commonly known as: RESTORIL Take 1 capsule (30 mg total) by mouth at bedtime as needed for sleep.   traZODone 100 MG tablet Commonly known as: DESYREL Take 1 tablet (100 mg total) by mouth at bedtime.               Discharge Care Instructions  (From admission, onward)           Start     Ordered  09/10/21 0000  Discharge wound care:       Comments: Clean wound with normal saline, pat gently dry.  Cover with size appropriate piece of silver Hydrofiber (Aquacel Ag plus, low-salt #[id number]) and topped with dry gauze, secure with a few turns of Kerlix roll gauze/paper tape.  Placed no tape on skin.  Change daily basis.   09/10/21 1228            Follow-up Information     [facility] Schedule an appointment as soon as possible for a visit in 54 day(s).   Contact information: [address] [place] [address] [phone]                 Discharge Exam: General exam: Alert, awake, oriented x 3; reporting no lightheadedness, no dizziness, no chest pain, no shortness of breath or any other complaints.  Feeling ready to go home. Respiratory system: Clear to auscultation. Respiratory effort normal.  Good  saturation on room air.  No using accessory muscle. Cardiovascular system:RRR. No murmurs, rubs, gallops. Gastrointestinal system: Abdomen is nondistended, soft and nontender.  Positive bowel sounds appreciated on exam; no ascites seen currently. Central nervous system: Alert and oriented. No focal neurological deficits. Extremities: No cyanosis or clubbing.  Skin: No petechiae; right foot with chronic dorsal wound without signs of superimposed infection. Psychiatry: Judgement and insight appear normal. Mood & affect appropriate.    Condition at discharge: Stable and improved.  The results of significant diagnostics from this hospitalization (including imaging, microbiology, ancillary and laboratory) are listed below for reference.   Imaging Studies: DG Chest 2 View  Result Date: 09/09/2021 CLINICAL DATA:  Generalized weakness since starting new blood pressure medication yesterday. EXAM: CHEST - 2 VIEW COMPARISON:  June 10, 2021 FINDINGS: There is stable right-sided venous Port-A-Cath positioning. The heart size and mediastinal contours are within normal limits. A trace amount of atelectasis is seen within the left lung base. Both lungs are otherwise clear. The visualized skeletal structures are unremarkable. IMPRESSION: No active cardiopulmonary disease. Electronically Signed   By: Virgina Norfolk M.D.   On: 09/09/2021 01:23   CT CHEST ABDOMEN PELVIS W CONTRAST  Result Date: 09/05/2021 CLINICAL DATA:  Colon cancer.  Restaging.  Insert body on EXAM: CT CHEST, ABDOMEN, AND PELVIS WITH CONTRAST TECHNIQUE: Multidetector CT imaging of the chest, abdomen and pelvis was performed following the standard protocol during bolus administration of intravenous contrast. RADIATION DOSE REDUCTION: This exam was performed according to the departmental dose-optimization program which includes automated exposure control, adjustment of the mA and/or kV according to patient size and/or use of iterative  reconstruction technique. CONTRAST:  139m OMNIPAQUE IOHEXOL 300 MG/ML  SOLN COMPARISON:  Abdomen pelvis CT 06/01/2021. Chest abdomen pelvis CT 04/19/2021. FINDINGS: CT CHEST FINDINGS Cardiovascular: The heart size is normal. No substantial pericardial effusion. No thoracic aortic aneurysm. No substantial atherosclerosis of the thoracic aorta. Coronary artery calcification is evident. Right Port-A-Cath tip is positioned in the distal SVC. Mediastinum/Nodes: No mediastinal lymphadenopathy. No evidence for gross hilar lymphadenopathy although assessment is limited by the lack of intravenous contrast on the current study. The esophagus has normal imaging features. There is no axillary lymphadenopathy. Lungs/Pleura: 7 mm left upper lobe nodule on 48/3 was 3 mm previously. 2 mm right upper lobe nodule on 73/3 is new since prior. 7 mm right middle lobe nodule on 78/3 was 4 mm previously. 4 mm medial left lower lobe nodule on 81/3 was 2 mm previously. No focal  airspace consolidation. No pleural effusion. Musculoskeletal: No worrisome lytic or sclerotic osseous abnormality. CT ABDOMEN PELVIS FINDINGS Hepatobiliary: Nodular hepatic contour is similar to prior compatible cirrhosis. Multiple ill-defined hypoattenuating lesions in both hepatic lobes are similar to prior. 13 mm lesion anterior lateral segment left liver on 55/2 was 13 mm when measured in a similar fashion on the prior study. 7 mm lesion in the tip of the lateral segment left liver on 54/2 was 9 mm previously (remeasured) 16 mm lesion inferolateral segment left liver on 59/2 was 14 mm previously (remeasured). There is no evidence for gallstones, gallbladder wall thickening, or pericholecystic fluid. No intrahepatic or extrahepatic biliary dilation. Pancreas: No focal mass lesion. No dilatation of the main duct. No intraparenchymal cyst. No peripancreatic edema. Spleen: No splenomegaly. No focal mass lesion. Adrenals/Urinary Tract: 2.2 cm right adrenal nodule on  57/2 was 1.0 cm previously (remeasured). Thickening of the left adrenal gland noted. Tiny hypoattenuating lesion in the right kidney is compatible with a cyst. Left kidney unremarkable. No evidence for hydroureter. Bladder is distended. Stomach/Bowel: Stomach is unremarkable. No gastric wall thickening. No evidence of outlet obstruction. Duodenum is normally positioned as is the ligament of Treitz. No small bowel wall thickening. No small bowel dilatation. The terminal ileum is normal. The appendix is not well visualized, but there is no edema or inflammation in the region of the cecum. No gross colonic mass. No colonic wall thickening. Vascular/Lymphatic: There is moderate atherosclerotic calcification of the abdominal aorta without aneurysm. 17 mm short axis hepatoduodenal ligament node on 66/2 has increased from 6 mm previously. 15 mm portal caval node on 67/2 was 8 mm previously (remeasured). 16 mm short axis celiac node on 65/2 was 8 mm previously (remeasured). Reproductive: The prostate gland and seminal vesicles are unremarkable. Other: Moderate to large volume ascites. Musculoskeletal: Umbilical hernia contains fat and fluid. Small right groin hernia contains fluid. No worrisome lytic or sclerotic osseous abnormality. Degenerative changes noted lumbar spine. Subtle small sclerotic lesion in the right anterior L2 vertebral body (74/2) is new in the interval. This may be degenerative but attention on follow-up recommended. IMPRESSION: 1. Interval increase in size of bilateral pulmonary nodules, compatible with metastatic disease. 2. Cirrhotic liver morphology with multiple ill-defined hypoattenuating lesions in both hepatic lobes, similar to prior. 3. Interval increase in size of hepatoduodenal ligament and celiac axis lymph nodes, compatible with metastatic disease. 4. Interval progression of right adrenal nodule, concerning for metastatic disease. 5. Moderate to large volume ascites. 6. Subtle small  sclerotic lesion in the right anterior L2 vertebral body, new in the interval. This may be degenerative but attention on follow-up recommended. 7. Aortic Atherosclerosis (ICD10-I70.0). Electronically Signed   By: Misty Stanley M.D.   On: 09/05/2021 09:30   US Paracentesis  Result Date: 09/06/2021 INDICATION: Malignant ascites EXAM: ULTRASOUND GUIDED LLQ PARACENTESIS MEDICATIONS: 10 cc 1% lidocaine. COMPLICATIONS: None immediate. PROCEDURE: Informed written consent was obtained from the patient after a discussion of the risks, benefits and alternatives to treatment. A timeout was performed prior to the initiation of the procedure. Initial ultrasound scanning demonstrates a large amount of ascites within the left lower abdominal quadrant. The left lower abdomen was prepped and draped in the usual sterile fashion. 1% lidocaine was used for local anesthesia. Following this, a Yueh catheter was introduced. An ultrasound image was saved for documentation purposes. The paracentesis was performed. The catheter was removed and a dressing was applied. The patient tolerated the procedure well without immediate post procedural complication.  Patient received post-procedure intravenous albumin; see nursing notes for details. FINDINGS: A total of approximately 5.6 liters of yellow fluid was removed. IMPRESSION: Successful ultrasound-guided paracentesis yielding 5.6 liters of peritoneal fluid. Read by Lavonia Drafts [facility] Electronically Signed   By: Lavonia Dana M.D.   On: 09/06/2021 12:44   US Paracentesis  Result Date: 08/30/2021 INDICATION: Recurrent malignant ascites EXAM: ULTRASOUND GUIDED RLQ PARACENTESIS MEDICATIONS: 10 cc 1% lidocaine. COMPLICATIONS: None immediate. PROCEDURE: Informed written consent was obtained from the patient after a discussion of the risks, benefits and alternatives to treatment. A timeout was performed prior to the initiation of the procedure. Initial ultrasound scanning demonstrates a large  amount of ascites within the right lower abdominal quadrant. The right lower abdomen was prepped and draped in the usual sterile fashion. 1% lidocaine was used for local anesthesia. Following this, a Yueh catheter was introduced. An ultrasound image was saved for documentation purposes. The paracentesis was performed. The catheter was removed and a dressing was applied. The patient tolerated the procedure well without immediate post procedural complication. Patient received post-procedure intravenous albumin; see nursing notes for details. FINDINGS: A total of approximately 5 liters of clear yellow fluid was removed. IMPRESSION: Successful ultrasound-guided paracentesis yielding 5 liters of peritoneal fluid. Read by Lavonia Drafts [facility] Electronically Signed   By: Lavonia Dana M.D.   On: 08/30/2021 10:24   US Paracentesis  Result Date: 08/23/2021 INDICATION: Malignant ascites EXAM: ULTRASOUND GUIDED THERAPEUTIC PARACENTESIS MEDICATIONS: None. COMPLICATIONS: None immediate. PROCEDURE: Informed written consent was obtained from the patient after a discussion of the risks, benefits and alternatives to treatment. A timeout was performed prior to the initiation of the procedure. Initial ultrasound scanning demonstrates a large amount of ascites within the LEFT lower abdominal quadrant. The right lower abdomen was prepped and draped in the usual sterile fashion. 1% lidocaine was used for local anesthesia. Following this, a 5 [place] Yueh catheter was introduced. An ultrasound image was saved for documentation purposes. The paracentesis was performed. The catheter was removed and a dressing was applied. The patient tolerated the procedure well without immediate post procedural complication. Patient received post-procedure intravenous albumin; see nursing notes for details. FINDINGS: A total of approximately 4 L of yellow ascitic fluid was removed. IMPRESSION: Successful ultrasound-guided paracentesis yielding 4 liters of  peritoneal fluid. Electronically Signed   By: Lavonia Dana M.D.   On: 08/23/2021 13:01   US Paracentesis  Result Date: 08/16/2021 Lavonia Dana, MD     08/16/2021 12:12 PM PreOperative Dx: Malignant ascites Postoperative Dx: Malignant ascites Procedure:   US guided paracentesis Radiologist:  Thornton Papas Anesthesia:  10 ml of1% lidocaine Specimen:  6.3 L of yellow ascitic fluid EBL:   < 1 ml Complications: None     Microbiology: Results for orders placed or performed during the hospital encounter of 09/08/21  Resp Panel by RT-PCR (Flu A&B, Covid) Nasopharyngeal Swab     Status: None   Collection Time: 09/09/21  9:23 PM   Specimen: Nasopharyngeal Swab; Nasopharyngeal(NP) swabs in vial transport medium  Result Value Ref Range Status   SARS Coronavirus 2 by RT PCR NEGATIVE NEGATIVE Final    Comment: (NOTE) SARS-CoV-2 target nucleic acids are NOT DETECTED.  The SARS-CoV-2 RNA is generally detectable in upper respiratory specimens during the acute phase of infection. The lowest concentration of SARS-CoV-2 viral copies this assay can detect is 138 copies/mL. A negative result does not preclude SARS-Cov-2 infection and should not be used as the sole basis for treatment  or other patient management decisions. A negative result may occur with  improper specimen collection/handling, submission of specimen other than nasopharyngeal swab, presence of viral mutation(s) within the areas targeted by this assay, and inadequate number of viral copies(<138 copies/mL). A negative result must be combined with clinical observations, patient history, and epidemiological information. The expected result is Negative.  Fact Sheet for Patients:  EntrepreneurPulse.com.au  Fact Sheet for Healthcare Providers:  IncredibleEmployment.be  This test is no t yet approved or cleared by the [place] FDA and  has been authorized for detection and/or diagnosis of SARS-CoV-2 by FDA under an  Emergency Use Authorization (EUA). This EUA will remain  in effect (meaning this test can be used) for the duration of the COVID-19 declaration under Section 564(b)(1) of the Act, 21 U.S.C.section 360bbb-3(b)(1), unless the authorization is terminated  or revoked sooner.       Influenza A by PCR NEGATIVE NEGATIVE Final   Influenza B by PCR NEGATIVE NEGATIVE Final    Comment: (NOTE) The Xpert Xpress SARS-CoV-2/FLU/RSV plus assay is intended as an aid in the diagnosis of influenza from Nasopharyngeal swab specimens and should not be used as a sole basis for treatment. Nasal washings and aspirates are unacceptable for Xpert Xpress SARS-CoV-2/FLU/RSV testing.  Fact Sheet for Patients: EntrepreneurPulse.com.au  Fact Sheet for Healthcare Providers: IncredibleEmployment.be  This test is not yet approved or cleared by the [place] FDA and has been authorized for detection and/or diagnosis of SARS-CoV-2 by FDA under an Emergency Use Authorization (EUA). This EUA will remain in effect (meaning this test can be used) for the duration of the COVID-19 declaration under Section 564(b)(1) of the Act, 21 U.S.C. section 360bbb-3(b)(1), unless the authorization is terminated or revoked.  Performed at [facility], [address] [place], [place] [address]   MRSA Next Gen by PCR, Nasal     Status: None   Collection Time: 09/09/21  9:40 PM   Specimen: Nasal Mucosa; Nasal Swab  Result Value Ref Range Status   MRSA by PCR Next Gen NOT DETECTED NOT DETECTED Final    Comment: (NOTE) The GeneXpert MRSA Assay (FDA approved for NASAL specimens only), is one component of a comprehensive MRSA colonization surveillance program. It is not intended to diagnose MRSA infection nor to guide or monitor treatment for MRSA infections. Test performance is not FDA approved in patients less than 30 years old. Performed at [facility], [address] [place], [place]  [address]     Labs: CBC: Recent Labs  Lab 09/04/21 [phone] 09/09/21 0155 09/10/21 0410  WBC 9.5 9.0 6.5  NEUTROABS 6.1 5.7 5.2  HGB 10.2* 9.8* 8.5*  HCT 30.3* 28.5* 26.2*  MCV 93.5 92.2 93.2  PLT 291 256 982   Basic Metabolic Panel: Recent Labs  Lab 09/04/21 0909 09/09/21 0155 09/10/21 0410  NA 130* 129* 130*  K 4.1 4.5 4.5  CL 98 97* 99  CO2 '26 25 25  '$ GLUCOSE 119* 112* 167*  BUN 25* 26* 24*  CREATININE 0.90 0.98 0.84  CALCIUM 8.3* 8.1* 8.2*  MG 2.0  --  2.3   Liver Function Tests: Recent Labs  Lab 09/04/21 0909 09/09/21 0155 09/10/21 0410  AST 63* 48* 34  ALT 33 24 19  ALKPHOS 499* 429* 333*  BILITOT 0.7 0.5 0.3  PROT 6.1* 5.8* 5.4*  ALBUMIN 2.9* 2.9* 2.8*   CBG: Recent Labs  Lab 09/09/21 1235 09/09/21 1729 09/09/21 2129 09/10/21 0742 09/10/21 1127  GLUCAP 157* 155* 146* 191* 196*  Discharge time spent: greater than 30 minutes.  Signed: Barton Dubois, MD [facility] 09/10/2021

## 2021-09-11 ENCOUNTER — Inpatient Hospital Stay (HOSPITAL_COMMUNITY): Payer: Medicaid Other | Attending: Hematology | Admitting: Hematology

## 2021-09-11 ENCOUNTER — Other Ambulatory Visit: Payer: Self-pay

## 2021-09-11 DIAGNOSIS — Z5112 Encounter for antineoplastic immunotherapy: Secondary | ICD-10-CM | POA: Diagnosis present

## 2021-09-11 DIAGNOSIS — C187 Malignant neoplasm of sigmoid colon: Secondary | ICD-10-CM | POA: Diagnosis present

## 2021-09-11 DIAGNOSIS — C7801 Secondary malignant neoplasm of right lung: Secondary | ICD-10-CM | POA: Insufficient documentation

## 2021-09-11 DIAGNOSIS — C787 Secondary malignant neoplasm of liver and intrahepatic bile duct: Secondary | ICD-10-CM | POA: Insufficient documentation

## 2021-09-11 DIAGNOSIS — C7802 Secondary malignant neoplasm of left lung: Secondary | ICD-10-CM | POA: Insufficient documentation

## 2021-09-11 DIAGNOSIS — R180 Malignant ascites: Secondary | ICD-10-CM | POA: Diagnosis not present

## 2021-09-11 DIAGNOSIS — C189 Malignant neoplasm of colon, unspecified: Secondary | ICD-10-CM

## 2021-09-11 NOTE — Patient Instructions (Addendum)
[place] at [facility] ?Discharge Instructions ? ?You were seen and examined today by Dr. Delton Coombes. Dr. Delton Coombes discussed your recent CT scan results, which revealed a progression of cancer. ? ?Dr. Delton Coombes discussed two options moving forward: ? ?Restart treatment with a new treatment regimen. Should you choose this, it cannot be restarted for at least a couple of weeks due to your decreased blood pressure. ? ?Focus on quality of life. If you choose to forego treatment, you can be referred to [facility] where they will assist in managing symptoms and focusing on quality of life. ? ?Follow-up with Dr. Delton Coombes as scheduled. ? ? ?Thank you for choosing [place] at [facility] to provide your oncology and hematology care.  To afford each patient quality time with our provider, please arrive at least 15 minutes before your scheduled appointment time.  ? ?If you have a lab appointment with the [facility] please come in thru the Main Entrance and check in at the main information desk. ? ?You need to re-schedule your appointment should you arrive 10 or more minutes late.  We strive to give you quality time with our providers, and arriving late affects you and other patients whose appointments are after yours.  Also, if you no show three or more times for appointments you may be dismissed from the clinic at the providers discretion.     ?Again, thank you for choosing [facility]  Our hope is that these requests will decrease the amount of time that you wait before being seen by our physicians.       ?_____________________________________________________________ ? ?Should you have questions after your visit to [facility], please contact our office at [phone] and follow the prompts.  Our office hours are 8:00 a.m. and 4:30 p.m. Monday - Friday.  Please note that voicemails left after 4:00 p.m. may not be  returned until the following business day.  We are closed weekends and major holidays.  You do have access to a nurse 24-7, just call the main number to the clinic [phone] and do not press any options, hold on the line and a nurse will answer the phone.   ? ?For prescription refill requests, have your pharmacy contact our office and allow 72 hours.   ? ?Due to Covid, you will need to wear a mask upon entering the hospital. If you do not have a mask, a mask will be given to you at the Main Entrance upon arrival. For doctor visits, patients may have 1 support person age 20 or older with them. For treatment visits, patients can not have anyone with them due to social distancing guidelines and our immunocompromised population.  ? ? ? ?

## 2021-09-11 NOTE — Progress Notes (Signed)
[place], [place] [address]   CLINIC:  Medical Oncology/Hematology  PCP:  [facility] / [place] [address] [phone]   REASON FOR VISIT:  Follow-up for colon cancer metastasized to liver  PRIOR THERAPY: none  NGS Results: not done  CURRENT THERAPY: FOLFOX every 2 weeks (on hold)  BRIEF ONCOLOGIC HISTORY:  Oncology History Overview Note  MMR normal Foundation One: low mutation burden   Metastasis to liver ([facility])  01/04/2021 Initial Diagnosis   Metastasis to liver ([place])   01/25/2021 -  Chemotherapy   Patient is on Treatment Plan : COLORECTAL FOLFOX q14d     Colon cancer metastasized to liver ([facility])  12/26/2020 Imaging   Ct abdomen and pelvis 1. Extensive hepatic metastatic disease and retroperitoneal adenopathy. 2. A 7 mm right lung base nodule, most consistent with metastatic disease. 3. No bowel obstruction. Normal appendix. 4. Aortic Atherosclerosis (ICD10-I70.0).   12/26/2020 Imaging   CT chest 1. Several small lung nodules consistent with metastatic disease. The primary malignancy remains unclear. The liver morphology suggests underlying cirrhosis, and multifocal hepatocellular carcinoma should be included in the differential diagnosis. 2. No acute abnormality in the chest.     01/04/2021 Initial Diagnosis   Carcinoma metastatic to lung [facility])   01/10/2021 Pathology Results   A. LIVER, NEEDLE CORE BIOPSY:  -  Metastatic adenocarcinoma  -  See comment   COMMENT:   The neoplastic cells are positive for cytokeratin 20 and CDX2 but negative for cytokeratin 7, TTF-1, PSA and prostein.  The combined morphology and immunophenotype are consistent with metastasis from a  gastrointestinal primary.  Dr. Saralyn Pilar reviewed the case and agrees with the above diagnosis.     01/13/2021 Procedure   Colonoscopy - Two 3 to 5 mm polyps in the transverse colon, removed with a cold snare. Resected and  retrieved. - Malignant partially obstructing tumor at 17 cm proximal to the anus. Biopsied. Tattooed. - The distal rectum and anal verge are normal on retroflexion view.   01/13/2021 Cancer Staging   Staging form: Colon and Rectum, AJCC 8th Edition - Clinical stage from 01/13/2021: Stage IVB (cTX, cN0, pM1b) - Signed by Heath Lark, MD on 01/13/2021 Stage prefix: Initial diagnosis Total positive nodes: 0    01/13/2021 Procedure   Colonoscopy - Two 3 to 5 mm polyps in the transverse colon, removed with a cold snare. Resected and retrieved. - Malignant partially obstructing tumor at 17 cm proximal to the anus. Biopsied. Tattooed. - The distal rectum and anal verge are normal on retroflexion view.   01/13/2021 Procedure   EGD - Normal esophagus. - Z-line regular, 40 cm from the incisors. - Normal stomach. - Nodular mucosa in   01/13/2021 Pathology Results   A. DUODENUM, NODULE, BIOPSY:  - Peptic duodenitis.  - No dysplasia or malignancy.   B. COLON, SIGMOID, MASS, BIOPSY:  - Invasive adenocarcinoma.   C. COLON, TRANSVERSE, POLYPECTOMY:  - Fragment of ulcerated tissue with adenocarcinoma, see comment.  - Tubular adenoma (X2 fragments).   COMMENT:   B. MMR will be ordered   01/13/2021 Tumor Marker   Patient's tumor was tested for the following markers: CEA. Results of the tumor marker test revealed [id number].   01/24/2021 Procedure   Successful right-sided port placement, with the tip of the catheter in the proximal right atrium.   Plan: Catheter ready for use.   See detailed procedure note with images in PACS. The  patient tolerated the procedure well without incident or complication and was returned to Recovery in stable conditi   01/25/2021 -  Chemotherapy   Patient is on Treatment Plan : COLORECTAL FOLFOX q14d     01/25/2021 Tumor Marker   Patient's tumor was tested for the following markers: CEA. Results of the tumor marker test revealed [id number].     CANCER STAGING:  Cancer Staging   Colon cancer metastasized to liver [facility]) Staging form: Colon and Rectum, AJCC 8th Edition - Clinical stage from 01/13/2021: Stage IVB (cTX, cN0, pM1b) - Signed by Heath Lark, MD on 01/13/2021   INTERVAL HISTORY:  [name], a 61 y.o. male, returns for routine follow-up of his colon cancer metastasized to liver. Aurelio was last seen on 07/19/2021.   Today he reports feeling fair. He has lost 12 lbs since his last visit. His leg swellings have improved. He is now taking 2 tablets (20 mg) hydrocortisone in the mooring and 1 tablet (10 mg) in the evening starting last week. His appetite is good, and he denies n/v/d/c.  He denies dizziness and light headedness. He denies abdominal distension.   REVIEW OF SYSTEMS:  Review of Systems  Constitutional:  Negative for appetite change and fatigue.  Gastrointestinal:  Negative for abdominal distention, constipation, diarrhea, nausea and vomiting.  Neurological:  Positive for numbness. Negative for dizziness and light-headedness.  All other systems reviewed and are negative.  PAST MEDICAL/SURGICAL HISTORY:  Past Medical History:  Diagnosis Date   Colon cancer ([place])    Diabetes mellitus without complication ([place])    Hypertension    Past Surgical History:  Procedure Laterality Date   BIOPSY  01/13/2021   Procedure: BIOPSY;  Surgeon: Harvel Quale, MD;  Location[facility];  Service: Gastroenterology;;   CATARACT EXTRACTION Right    COLONOSCOPY WITH PROPOFOL N/A 01/13/2021   Procedure: COLONOSCOPY WITH PROPOFOL;  Surgeon: Harvel Quale, MD;  Location: [facility];  Service: Gastroenterology;  Laterality: N/A;  11:05   ESOPHAGOGASTRODUODENOSCOPY (EGD) WITH PROPOFOL N/A 01/13/2021   Procedure: ESOPHAGOGASTRODUODENOSCOPY (EGD) WITH PROPOFOL;  Surgeon: Harvel Quale, MD;  Location: [facility];  Service: Gastroenterology;  Laterality: N/A;   IR IMAGING GUIDED PORT INSERTION  01/24/2021   LIVER BIOPSY   01/10/2021   U/S guided   POLYPECTOMY  01/13/2021   Procedure: POLYPECTOMY;  Surgeon: Montez Morita, Quillian Quince, MD;  Location: [facility];  Service: Gastroenterology;;    SOCIAL HISTORY:  Social History   Socioeconomic History   Marital status: Married    Spouse name: Not on file   Number of children: Not on file   Years of education: Not on file   Highest education level: Not on file  Occupational History   Not on file  Tobacco Use   Smoking status: Never   Smokeless tobacco: Never  Vaping Use   Vaping Use: Never used  Substance and Sexual Activity   Alcohol use: Not Currently   Drug use: Not Currently   Sexual activity: Not Currently  Other Topics Concern   Not on file  Social History Narrative   Married, no childer   Social Determinants of Health   Financial Resource Strain: High Risk   Difficulty of Paying Living Expenses: Very hard  Food Insecurity: No Food Insecurity   Worried About Charity fundraiser in the Last Year: Never true   Ran Out of Food in the Last Year: Never true  Transportation Needs: No Transportation Needs  Lack of Transportation (Medical): No   Lack of Transportation (Non-Medical): No  Physical Activity: Inactive   Days of Exercise per Week: 0 days   Minutes of Exercise per Session: 0 min  Stress: Not on file  Social Connections: Not on file  Intimate Partner Violence: Not At Risk   Fear of Current or Ex-Partner: No   Emotionally Abused: No   Physically Abused: No   Sexually Abused: No    FAMILY HISTORY:  Family History  Problem Relation Age of Onset   Cancer Father 33       prostate ca    CURRENT MEDICATIONS:  Current Outpatient Medications  Medication Sig Dispense Refill   acetaminophen (TYLENOL) 500 MG tablet Take 1,000 mg by mouth every 6 (six) hours as needed for moderate pain.     cephALEXin (KEFLEX) 500 MG capsule Take 500 mg by mouth 3 (three) times daily.     fluorouracil CALGB 56387 2,400 mg/m2 in sodium chloride  0.9 % 150 mL Inject 2,400 mg/m2 into the vein over 48 hr.     furosemide (LASIX) 40 MG tablet Take 1 tablet (40 mg total) by mouth daily as needed. 30 tablet 2   hydrocortisone (CORTEF) 10 MG tablet 20 mg (2 tablets) in the morning and 10 mg (1 tablet) in the evening. 90 tablet 1   insulin NPH-regular Human (70-30) 100 UNIT/ML injection Inject 10-12 Units into the skin 2 (two) times daily.     LEUCOVORIN CALCIUM IV Inject 400 mg/m2 into the vein every 14 (fourteen) days.     lidocaine (XYLOCAINE) 5 % ointment Apply 1 application topically in the morning and at bedtime. Apply to both hands twice a day 35.44 g 0   Lidocaine, Anorectal, 5 % CREA Apply 1 application topically 2 (two) times daily as needed. 30 g 3   lidocaine-prilocaine (EMLA) cream Apply to affected area once 30 g 3   LORazepam (ATIVAN) 0.5 MG tablet Take 1 tablet by mouth twice daily as needed for anxiety 60 tablet 3   magnesium oxide (MAG-OX) 400 (240 Mg) MG tablet Take 1 tablet (400 mg total) by mouth 2 (two) times daily. 60 tablet 3   midodrine (PROAMATINE) 10 MG tablet Take 1 tablet (10 mg total) by mouth 3 (three) times daily with meals. 90 tablet 0   morphine (MSIR) 15 MG tablet Take 1 tablet (15 mg total) by mouth 2 (two) times daily as needed for severe pain. 60 tablet 0   ondansetron (ZOFRAN) 8 MG tablet Take 1 tablet (8 mg total) by mouth every 8 (eight) hours as needed for nausea. 60 tablet 3   pentoxifylline (TRENTAL) 400 MG CR tablet Take by mouth.     Potassium Chloride ER (K-TAB) 20 MEQ TBCR Take 20 mEq by mouth daily. 30 tablet 3   prochlorperazine (COMPAZINE) 10 MG tablet Take 1 tablet by mouth every 6 (six) hours as needed.     silver sulfADIAZINE (SILVADENE) 1 % cream Apply 1 application topically daily. 50 g 0   simvastatin (ZOCOR) 20 MG tablet Take 20 mg by mouth every evening.     tamsulosin (FLOMAX) 0.4 MG CAPS capsule Take 1 capsule (0.4 mg total) by mouth daily after supper. 30 capsule 3   temazepam  (RESTORIL) 30 MG capsule Take 1 capsule (30 mg total) by mouth at bedtime as needed for sleep. 30 capsule 1   traZODone (DESYREL) 100 MG tablet Take 1 tablet (100 mg total) by mouth at bedtime. 30 tablet 2  No current facility-administered medications for this visit.    ALLERGIES:  No Known Allergies  PHYSICAL EXAM:  Performance status (ECOG): 1 - Symptomatic but completely ambulatory  There were no vitals filed for this visit. Wt Readings from Last 3 Encounters:  07/19/21 162 lb 6.4 oz (73.7 kg)  06/28/21 194 lb (88 kg)  06/14/21 197 lb 5 oz (89.5 kg)   Physical Exam Vitals reviewed.  Constitutional:      Appearance: Normal appearance.     Comments: In wheelchair  Cardiovascular:     Rate and Rhythm: Normal rate and regular rhythm.     Pulses: Normal pulses.     Heart sounds: Normal heart sounds.  Pulmonary:     Effort: Pulmonary effort is normal.     Breath sounds: Normal breath sounds.  Musculoskeletal:     Left lower leg: 1+ Edema present.  Neurological:     General: No focal deficit present.     Mental Status: He is alert and oriented to person, place, and time.  Psychiatric:        Mood and Affect: Mood normal.        Behavior: Behavior normal.     LABORATORY DATA:  I have reviewed the labs as listed.  CBC Latest Ref Rng & Units 09/10/2021 09/09/2021 09/04/2021  WBC 4.0 - 10.5 K/uL 6.5 9.0 9.5  Hemoglobin 13.0 - 17.0 g/dL 8.5(L) 9.8(L) 10.2(L)  Hematocrit 39.0 - 52.0 % 26.2(L) 28.5(L) 30.3(L)  Platelets 150 - 400 K/uL 235 256 291   CMP Latest Ref Rng & Units 09/10/2021 09/09/2021 09/04/2021  Glucose 70 - 99 mg/dL 167(H) 112(H) 119(H)  BUN 6 - 20 mg/dL 24(H) 26(H) 25(H)  Creatinine 0.61 - 1.24 mg/dL 0.84 0.98 0.90  Sodium 135 - 145 mmol/L 130(L) 129(L) 130(L)  Potassium 3.5 - 5.1 mmol/L 4.5 4.5 4.1  Chloride 98 - 111 mmol/L 99 97(L) 98  CO2 22 - 32 mmol/L 25 25 26   Calcium 8.9 - 10.3 mg/dL 8.2(L) 8.1(L) 8.3(L)  Total Protein 6.5 - 8.1 g/dL 5.4(L) 5.8(L) 6.1(L)   Total Bilirubin 0.3 - 1.2 mg/dL 0.3 0.5 0.7  Alkaline Phos 38 - 126 U/L 333(H) 429(H) 499(H)  AST 15 - 41 U/L 34 48(H) 63(H)  ALT 0 - 44 U/L 19 24 33    DIAGNOSTIC IMAGING:  I have independently reviewed the scans and discussed with the patient. DG Chest 2 View  Result Date: 09/09/2021 CLINICAL DATA:  Generalized weakness since starting new blood pressure medication yesterday. EXAM: CHEST - 2 VIEW COMPARISON:  June 10, 2021 FINDINGS: There is stable right-sided venous Port-A-Cath positioning. The heart size and mediastinal contours are within normal limits. A trace amount of atelectasis is seen within the left lung base. Both lungs are otherwise clear. The visualized skeletal structures are unremarkable. IMPRESSION: No active cardiopulmonary disease. Electronically Signed   By: Virgina Norfolk M.D.   On: 09/09/2021 01:23   CT CHEST ABDOMEN PELVIS W CONTRAST  Result Date: 09/05/2021 CLINICAL DATA:  Colon cancer.  Restaging.  Insert body on EXAM: CT CHEST, ABDOMEN, AND PELVIS WITH CONTRAST TECHNIQUE: Multidetector CT imaging of the chest, abdomen and pelvis was performed following the standard protocol during bolus administration of intravenous contrast. RADIATION DOSE REDUCTION: This exam was performed according to the departmental dose-optimization program which includes automated exposure control, adjustment of the mA and/or kV according to patient size and/or use of iterative reconstruction technique. CONTRAST:  168m OMNIPAQUE IOHEXOL 300 MG/ML  SOLN COMPARISON:  Abdomen pelvis  CT 06/01/2021. Chest abdomen pelvis CT 04/19/2021. FINDINGS: CT CHEST FINDINGS Cardiovascular: The heart size is normal. No substantial pericardial effusion. No thoracic aortic aneurysm. No substantial atherosclerosis of the thoracic aorta. Coronary artery calcification is evident. Right Port-A-Cath tip is positioned in the distal SVC. Mediastinum/Nodes: No mediastinal lymphadenopathy. No evidence for gross hilar  lymphadenopathy although assessment is limited by the lack of intravenous contrast on the current study. The esophagus has normal imaging features. There is no axillary lymphadenopathy. Lungs/Pleura: 7 mm left upper lobe nodule on 48/3 was 3 mm previously. 2 mm right upper lobe nodule on 73/3 is new since prior. 7 mm right middle lobe nodule on 78/3 was 4 mm previously. 4 mm medial left lower lobe nodule on 81/3 was 2 mm previously. No focal airspace consolidation. No pleural effusion. Musculoskeletal: No worrisome lytic or sclerotic osseous abnormality. CT ABDOMEN PELVIS FINDINGS Hepatobiliary: Nodular hepatic contour is similar to prior compatible cirrhosis. Multiple ill-defined hypoattenuating lesions in both hepatic lobes are similar to prior. 13 mm lesion anterior lateral segment left liver on 55/2 was 13 mm when measured in a similar fashion on the prior study. 7 mm lesion in the tip of the lateral segment left liver on 54/2 was 9 mm previously (remeasured) 16 mm lesion inferolateral segment left liver on 59/2 was 14 mm previously (remeasured). There is no evidence for gallstones, gallbladder wall thickening, or pericholecystic fluid. No intrahepatic or extrahepatic biliary dilation. Pancreas: No focal mass lesion. No dilatation of the main duct. No intraparenchymal cyst. No peripancreatic edema. Spleen: No splenomegaly. No focal mass lesion. Adrenals/Urinary Tract: 2.2 cm right adrenal nodule on 57/2 was 1.0 cm previously (remeasured). Thickening of the left adrenal gland noted. Tiny hypoattenuating lesion in the right kidney is compatible with a cyst. Left kidney unremarkable. No evidence for hydroureter. Bladder is distended. Stomach/Bowel: Stomach is unremarkable. No gastric wall thickening. No evidence of outlet obstruction. Duodenum is normally positioned as is the ligament of Treitz. No small bowel wall thickening. No small bowel dilatation. The terminal ileum is normal. The appendix is not well  visualized, but there is no edema or inflammation in the region of the cecum. No gross colonic mass. No colonic wall thickening. Vascular/Lymphatic: There is moderate atherosclerotic calcification of the abdominal aorta without aneurysm. 17 mm short axis hepatoduodenal ligament node on 66/2 has increased from 6 mm previously. 15 mm portal caval node on 67/2 was 8 mm previously (remeasured). 16 mm short axis celiac node on 65/2 was 8 mm previously (remeasured). Reproductive: The prostate gland and seminal vesicles are unremarkable. Other: Moderate to large volume ascites. Musculoskeletal: Umbilical hernia contains fat and fluid. Small right groin hernia contains fluid. No worrisome lytic or sclerotic osseous abnormality. Degenerative changes noted lumbar spine. Subtle small sclerotic lesion in the right anterior L2 vertebral body (74/2) is new in the interval. This may be degenerative but attention on follow-up recommended. IMPRESSION: 1. Interval increase in size of bilateral pulmonary nodules, compatible with metastatic disease. 2. Cirrhotic liver morphology with multiple ill-defined hypoattenuating lesions in both hepatic lobes, similar to prior. 3. Interval increase in size of hepatoduodenal ligament and celiac axis lymph nodes, compatible with metastatic disease. 4. Interval progression of right adrenal nodule, concerning for metastatic disease. 5. Moderate to large volume ascites. 6. Subtle small sclerotic lesion in the right anterior L2 vertebral body, new in the interval. This may be degenerative but attention on follow-up recommended. 7. Aortic Atherosclerosis (ICD10-I70.0). Electronically Signed   By: Verda Cumins.D.  On: 09/05/2021 09:30   US Paracentesis  Result Date: 09/06/2021 INDICATION: Malignant ascites EXAM: ULTRASOUND GUIDED LLQ PARACENTESIS MEDICATIONS: 10 cc 1% lidocaine. COMPLICATIONS: None immediate. PROCEDURE: Informed written consent was obtained from the patient after a discussion of  the risks, benefits and alternatives to treatment. A timeout was performed prior to the initiation of the procedure. Initial ultrasound scanning demonstrates a large amount of ascites within the left lower abdominal quadrant. The left lower abdomen was prepped and draped in the usual sterile fashion. 1% lidocaine was used for local anesthesia. Following this, a Yueh catheter was introduced. An ultrasound image was saved for documentation purposes. The paracentesis was performed. The catheter was removed and a dressing was applied. The patient tolerated the procedure well without immediate post procedural complication. Patient received post-procedure intravenous albumin; see nursing notes for details. FINDINGS: A total of approximately 5.6 liters of yellow fluid was removed. IMPRESSION: Successful ultrasound-guided paracentesis yielding 5.6 liters of peritoneal fluid. Read by Lavonia Drafts [facility] Electronically Signed   By: Lavonia Dana M.D.   On: 09/06/2021 12:44   US Paracentesis  Result Date: 08/30/2021 INDICATION: Recurrent malignant ascites EXAM: ULTRASOUND GUIDED RLQ PARACENTESIS MEDICATIONS: 10 cc 1% lidocaine. COMPLICATIONS: None immediate. PROCEDURE: Informed written consent was obtained from the patient after a discussion of the risks, benefits and alternatives to treatment. A timeout was performed prior to the initiation of the procedure. Initial ultrasound scanning demonstrates a large amount of ascites within the right lower abdominal quadrant. The right lower abdomen was prepped and draped in the usual sterile fashion. 1% lidocaine was used for local anesthesia. Following this, a Yueh catheter was introduced. An ultrasound image was saved for documentation purposes. The paracentesis was performed. The catheter was removed and a dressing was applied. The patient tolerated the procedure well without immediate post procedural complication. Patient received post-procedure intravenous albumin; see nursing  notes for details. FINDINGS: A total of approximately 5 liters of clear yellow fluid was removed. IMPRESSION: Successful ultrasound-guided paracentesis yielding 5 liters of peritoneal fluid. Read by Lavonia Drafts [facility] Electronically Signed   By: Lavonia Dana M.D.   On: 08/30/2021 10:24   US Paracentesis  Result Date: 08/23/2021 INDICATION: Malignant ascites EXAM: ULTRASOUND GUIDED THERAPEUTIC PARACENTESIS MEDICATIONS: None. COMPLICATIONS: None immediate. PROCEDURE: Informed written consent was obtained from the patient after a discussion of the risks, benefits and alternatives to treatment. A timeout was performed prior to the initiation of the procedure. Initial ultrasound scanning demonstrates a large amount of ascites within the LEFT lower abdominal quadrant. The right lower abdomen was prepped and draped in the usual sterile fashion. 1% lidocaine was used for local anesthesia. Following this, a 5 [place] Yueh catheter was introduced. An ultrasound image was saved for documentation purposes. The paracentesis was performed. The catheter was removed and a dressing was applied. The patient tolerated the procedure well without immediate post procedural complication. Patient received post-procedure intravenous albumin; see nursing notes for details. FINDINGS: A total of approximately 4 L of yellow ascitic fluid was removed. IMPRESSION: Successful ultrasound-guided paracentesis yielding 4 liters of peritoneal fluid. Electronically Signed   By: Lavonia Dana M.D.   On: 08/23/2021 13:01   US Paracentesis  Result Date: 08/16/2021 Lavonia Dana, MD     08/16/2021 12:12 PM PreOperative Dx: Malignant ascites Postoperative Dx: Malignant ascites Procedure:   US guided paracentesis Radiologist:  Thornton Papas Anesthesia:  10 ml of1% lidocaine Specimen:  6.3 L of yellow ascitic fluid EBL:   < 1 ml Complications:  None      ASSESSMENT:  1.  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 - CT [facility] with contrast on 12/26/2020 showed extensive hepatic metastatic disease and retroperitoneal adenopathy.  CT chest without contrast on 12/26/2020 showed several small subcentimeter lung nodules consistent with metastatic disease.  Liver morphology suggests underlying cirrhosis. - Liver biopsy on 01/10/2021 consistent with metastatic adenocarcinoma, CK20 and CDX2 positive, negative for CK7, TTF-1, PSA and prostein. - Colonoscopy and biopsy of the sigmoid mass on 01/13/2021 consistent with adenocarcinoma. - 10 pound weight loss prior to start of therapy. - FOLFOX started on 01/25/2021. - Foundation 1 testing on 01/10/2021 showed MSI-stable, TMB low, negative for RAS/BRAF/HER2 mutation.  2.  Social/family history: - Lives at home with his wife.  He drove truck and operated equipment.  No smoking history. - Father had prostate cancer   PLAN:  1.  Metastatic sigmoid colon cancer to liver and lungs: - Last chemotherapy was in November 2022.  We have stopped it due to poor tolerability. - Reviewed labs from 09/04/2021.  CEA has increased to 137 from 20.9 in January. - He lost about 12 pounds since 07/19/2021. - We have also reviewed CT CAP from 09/04/2021 which showed increasing bilateral lung nodules, cirrhotic liver morphology, increase in size of hepatoduodenal ligament and celiac axis lymph nodes.  Progression of right adrenal nodule.  Moderate to large volume ascites.  Subtle small sclerotic lesion in the right anterior L2 vertebral body new, in the interval.  This may be degenerative. - We have discussed further options including best supportive care in the form of hospice versus resuming active therapy as he is cancer is getting worse.  Patient would like to restart on cancer therapy.  However he had poor tolerance. - We will plan to start him on infusional 5-FU and leucovorin.  We will try to avoid oxaliplatin which caused a lot of cold sensitivity and other side  effects.  We will also try to avoid Vectibix which causes a lot of rash.  We might consider adding bevacizumab in place of Vectibix.  We might also consider adding Irinotecan in place of oxaliplatin. - We will likely start him back on therapy in 2 weeks after his low blood pressure issues improved.  2.  Coccygeal pain: - Continue half tablet MS IR at bedtime as needed.  3.  Hypotension: - He was recently hospitalized with dizziness and hypotension. - We have started him on midodrine 10 mg 3 times daily around 09/06/2021. - In the hospital I have added hydrocodone cortisone 20 mg in the morning and 10 mg in the evening. - We will continue the same doses at this time.  We will slowly try to taper off hydrocortisone if his blood pressure improves.  Today blood pressure is 95/69.  He is not feeling dizzy.  4.  Difficulty falling/staying asleep: - Continue trazodone as needed.  5.  Ascites/leg swelling: - Lasix and spironolactone are on hold due to hypotension. - He does not feel like his abdomen is full.  He will delay it until next week for next thoracentesis paracentesis.  6.  Hypomagnesemia: - Continue magnesium twice daily.  Magnesium is 1.8.  7.  Anxiety: - Continue Ativan 0.5 mg daily as needed.   Orders placed this encounter:  No orders of the defined types were placed in this encounter.    Derek Jack, MD [facility] [phone]   I,  Thana Ates, am acting as a Education administrator for Dr. Derek Jack.  I, Derek Jack MD, have reviewed the above documentation for accuracy and completeness, and I agree with the above.

## 2021-09-13 ENCOUNTER — Encounter (HOSPITAL_COMMUNITY): Payer: Self-pay

## 2021-09-13 ENCOUNTER — Ambulatory Visit (HOSPITAL_COMMUNITY)
Admission: RE | Admit: 2021-09-13 | Discharge: 2021-09-13 | Disposition: A | Discharge status: Home / Self Care | Payer: Medicaid Other | Source: Ambulatory Visit | Attending: Hematology | Admitting: Hematology

## 2021-09-13 ENCOUNTER — Ambulatory Visit (HOSPITAL_COMMUNITY): Payer: Medicaid Other

## 2021-09-13 ENCOUNTER — Other Ambulatory Visit: Payer: Self-pay

## 2021-09-13 DIAGNOSIS — R180 Malignant ascites: Secondary | ICD-10-CM | POA: Diagnosis present

## 2021-09-13 DIAGNOSIS — C189 Malignant neoplasm of colon, unspecified: Secondary | ICD-10-CM | POA: Insufficient documentation

## 2021-09-13 DIAGNOSIS — C787 Secondary malignant neoplasm of liver and intrahepatic bile duct: Secondary | ICD-10-CM | POA: Diagnosis present

## 2021-09-13 MED ORDER — ALBUMIN HUMAN 25 % IV SOLN: 50.0000 g | Freq: Once | INTRAVENOUS | Status: AC

## 2021-09-13 MED ORDER — ALBUMIN HUMAN 25 % IV SOLN
INTRAVENOUS | Status: AC
  Administered 2021-09-13: 50 g via INTRAVENOUS
  Filled 2021-09-13: qty 200

## 2021-09-13 MED ORDER — SODIUM CHLORIDE FLUSH 0.9 % IV SOLN
INTRAVENOUS | Status: AC
  Administered 2021-09-13: 10 mL
  Filled 2021-09-13: qty 10

## 2021-09-13 NOTE — Procedures (Signed)
? ?  US guided LLQ paracentesis ? ?3 liters yellow fluid obtained ?No labs per MD ? ?Tolerated well ? ?EBL: none ? ?

## 2021-09-13 NOTE — Progress Notes (Addendum)
PT tolerated left sided paracentesis procedure and 50 G of IV albumin well today and verbalized understanding of discharge instructions. PT pushed to waiting area where family was waiting with no acute distress noted and stable vital signs. 3 Liters of clear yellow ascites removed today.  ?

## 2021-09-14 ENCOUNTER — Ambulatory Visit (HOSPITAL_COMMUNITY): Payer: Medicaid Other | Admitting: Physical Therapy

## 2021-09-18 ENCOUNTER — Other Ambulatory Visit: Payer: Self-pay

## 2021-09-18 ENCOUNTER — Encounter (HOSPITAL_COMMUNITY): Payer: Self-pay | Admitting: Physical Therapy

## 2021-09-18 ENCOUNTER — Ambulatory Visit (HOSPITAL_COMMUNITY): Payer: Medicaid Other | Admitting: Physical Therapy

## 2021-09-18 DIAGNOSIS — R600 Localized edema: Secondary | ICD-10-CM

## 2021-09-18 DIAGNOSIS — S91301S Unspecified open wound, right foot, sequela: Secondary | ICD-10-CM | POA: Diagnosis not present

## 2021-09-18 DIAGNOSIS — R262 Difficulty in walking, not elsewhere classified: Secondary | ICD-10-CM

## 2021-09-18 NOTE — Therapy (Signed)
[place] ?[place] ?[address] ?[place], [place], [address] ?Phone: [phone]   Fax:  [phone] ? ?Wound Care Therapy ? ?Patient Details  ?Name: Henry Johns ?MRN: [id number] ?Date of Birth: 31-Dec-1960 ?Referring Provider (PT): Derek Jack ? ? ?Encounter Date: 09/18/2021 ? ? PT End of Session - 09/18/21 1405   ? ? Visit Number 21   ? Number of Visits 24   ? Date for PT Re-Evaluation 09/22/21   ? Authorization Type [employer] medicaid requested more visits.   ? Progress Note Due on Visit 24   ? PT Start Time 1405   ? PT Stop Time 1435   ? PT Time Calculation (min) 30 min   ? Activity Tolerance Patient tolerated treatment well   ? Behavior During Therapy [facility] for tasks assessed/performed   ? ?  ?  ? ?  ? ? ?Past Medical History:  ?Diagnosis Date  ? Colon cancer ([place])   ? Diabetes mellitus without complication ([place])   ? Hypertension   ? ? ?Past Surgical History:  ?Procedure Laterality Date  ? BIOPSY  01/13/2021  ? Procedure: BIOPSY;  Surgeon: Montez Morita, Quillian Quince, MD;  [facility];  Service: Gastroenterology;;  ? CATARACT EXTRACTION Right   ? COLONOSCOPY WITH PROPOFOL N/A 01/13/2021  ? Procedure: COLONOSCOPY WITH PROPOFOL;  Surgeon: Harvel Quale, MD;  [facility];  Service: Gastroenterology;  Laterality: N/A;  11:05  ? ESOPHAGOGASTRODUODENOSCOPY (EGD) WITH PROPOFOL N/A 01/13/2021  ? Procedure: ESOPHAGOGASTRODUODENOSCOPY (EGD) WITH PROPOFOL;  Surgeon: Harvel Quale, MD;  [facility];  Service: Gastroenterology;  Laterality: N/A;  ? IR IMAGING GUIDED PORT INSERTION  01/24/2021  ? LIVER BIOPSY  01/10/2021  ? U/S guided  ? POLYPECTOMY  01/13/2021  ? Procedure: POLYPECTOMY;  Surgeon: Harvel Quale, MD;  [facility];  Service: Gastroenterology;;  ? ? ?There were no vitals filed for this visit. ? ? ? ? ? ? ? ? ? ? ? ? ? ? Wound Therapy - 09/18/21 0001   ? ? Subjective Has been in the hospital  and not feeling well.   ? Patient and Family Stated Goals wound to heal   ? Date of Onset 06/14/21   ? Prior Treatments ointment, MD dressing   ? Pain Score 0-No pain   ? Evaluation and Treatment Procedures Explained to Patient/Family Yes   ? Evaluation and Treatment Procedures agreed to   ? Wound Properties Date First Assessed: 08/15/21 Time First Assessed: 0100 Wound Type: Other (Comment) Location: Foot Location Orientation: Anterior;Right;Lateral Present on Admission: Yes  ? Dressing Type Hydrogel   ? Dressing Status Old drainage   ? Dressing Change Frequency PRN   ? Site / Wound Assessment Granulation tissue   ? Peri-wound Assessment Intact   ? Treatment Cleansed;Debridement (Selective)   ? Wound Properties Date First Assessed: 06/28/21 Time First Assessed: 1550 Wound Type: Other (Comment) Location: Foot Location Orientation: Right Wound Description (Comments): dorsal wound Present on Admission: Yes  ? Wound Image Images linked: 1   ? Dressing Type Hydrogel   ? Dressing Status Old drainage   ? Dressing Change Frequency PRN   ? Site / Wound Assessment Granulation tissue   ? % Wound base Red or Granulating 90%   ? % Wound base Yellow/Fibrinous Exudate 10%   ? Margins Attached edges (approximated)   ? Treatment Cleansed;Debridement (Selective)   ? Selective Debridement - Location wound bed   ? Selective Debridement -  Tools Used Forceps;Scalpel   ? Selective Debridement - Tissue Removed slough, biofilm.   ? Wound Therapy - Clinical Statement Patient returns after being hospitalized for issues with blood pressure. Patients dressing slid off his foot with wound exposed under sock. Patients wound bed dry with increase in slough but he tolerates debridment of slough well. Continued with hydrogel 4x4, abd, medipore tape and wrapped foot with conform to hold ressing/tape in place.   ? Wound Therapy - Functional Problem List Comorbidity 1;Fitness   ? Factors Delaying/Impairing Wound Healing Other (comment)   colon cancer  and active chemo  ? Wound Therapy - Frequency 2X / week   ? Wound Plan If wound continues to stay granulated pt may be ready for discharge with self care next week.  Will assess and ensure biofilm and slough do not return.   ? Dressing  hydrogel, 4x4, ABD pad and medipore tape, conform   ? ?  ?  ? ?  ? ? ? ? ? ? ? ? ? ? ? ? PT Short Term Goals - 09/07/21 1613   ? ?  ? PT SHORT TERM GOAL #1  ? Title PT Rt dorsal wound wound to be 70% granulated   ? Status Achieved   ?  ? PT SHORT TERM GOAL #2  ? Title Pt lateral wounds on Rt LE to be healed.   ? Status On-going   ?  ? PT SHORT TERM GOAL #3  ? Title Edema to be minimal and pt to have been measured for compression garments.   ? Baseline 3/2: No edema present   ? ?  ?  ? ?  ? ? ? ? PT Long Term Goals - 09/07/21 1614   ? ?  ? PT LONG TERM GOAL #1  ? Title Wound on dorsal aspect of Rt foot to be healed   ? Status On-going   ? ?  ?  ? ?  ? ? ? ? ? ? ? ? Plan - 09/18/21 1406   ? ? Clinical Impression Statement see above   ? Personal Factors and Comorbidities Comorbidity 1;Fitness   ? Comorbidities colon cancer with active chemo   ? Examination-Activity Limitations Bathing;Locomotion Level;Hygiene/Grooming;Dressing   ? Examination-Participation Restrictions Community Activity;Shop   ? Stability/Clinical Decision Making Evolving/Moderate complexity   ? Rehab Potential Good   ? PT Frequency 2x / week   ? PT Duration 6 weeks   ? PT Treatment/Interventions Other (comment);Compression bandaging;Patient/family education   debridement, compression dressing  ? PT Next Visit Plan Continue with debridement of wound.   ? PT Home Exercise Plan keep dressing dry   ? Consulted and Agree with Plan of Care Patient   ? ?  ?  ? ?  ? ? ?Patient will benefit from skilled therapeutic intervention in order to improve the following deficits and impairments:  Decreased skin integrity, Increased edema, Difficulty walking ? ?Visit Diagnosis: ?Open wound of foot, right, sequela ? ?Difficulty in  walking, not elsewhere classified ? ?Localized edema ? ? ? ? ?Problem List ?Patient Active Problem List  ? Diagnosis Date Noted  ? Hypotension 09/09/2021  ? Colitis 06/01/2021  ? Hypokalemia 06/01/2021  ? Hyperlipidemia 06/01/2021  ? Chronic pain 06/01/2021  ? 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 Metastasis to liver ([place]) 01/04/2021  ? Colon cancer metastasized to liver [facility]) 01/04/2021  ? Other constipation 01/04/2021  ? Rectal bleeding 01/04/2021  ? Nausea without vomiting 01/04/2021  ? 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  Diabetes mellitus with neuropathy ([place])   ? ? ?2:43 PM, 09/18/21 ?Mearl Latin PT, DPT ?Physical Therapist at [facility] ?[facility] ? ? ?[place] ?[place] ?[address] ?[place], [place], [address] ?Phone: [phone]   Fax:  [phone] ? ?Name: Henry Johns ?MRN: [id number] ?Date of Birth: 1961/01/03 ? ? ? ? ?

## 2021-09-20 ENCOUNTER — Other Ambulatory Visit: Payer: Self-pay

## 2021-09-20 ENCOUNTER — Ambulatory Visit (HOSPITAL_COMMUNITY)
Admission: RE | Admit: 2021-09-20 | Discharge: 2021-09-20 | Disposition: A | Discharge status: Home / Self Care | Payer: Medicaid Other | Source: Ambulatory Visit | Attending: Hematology | Admitting: Hematology

## 2021-09-20 ENCOUNTER — Encounter (HOSPITAL_COMMUNITY): Payer: Self-pay

## 2021-09-20 DIAGNOSIS — R180 Malignant ascites: Secondary | ICD-10-CM | POA: Diagnosis present

## 2021-09-20 DIAGNOSIS — C189 Malignant neoplasm of colon, unspecified: Secondary | ICD-10-CM | POA: Diagnosis present

## 2021-09-20 DIAGNOSIS — C787 Secondary malignant neoplasm of liver and intrahepatic bile duct: Secondary | ICD-10-CM | POA: Insufficient documentation

## 2021-09-20 MED ORDER — SODIUM CHLORIDE FLUSH 0.9 % IV SOLN
INTRAVENOUS | Status: AC
  Administered 2021-09-20: 10 mL
  Filled 2021-09-20: qty 10

## 2021-09-20 MED ORDER — ALBUMIN HUMAN 25 % IV SOLN
50.0000 g | Freq: Once | INTRAVENOUS | Status: AC
Start: 2021-09-20 — End: 2021-09-20

## 2021-09-20 MED ORDER — ALBUMIN HUMAN 25 % IV SOLN
INTRAVENOUS | Status: AC
  Administered 2021-09-20: 50 g via INTRAVENOUS
  Filled 2021-09-20: qty 200

## 2021-09-20 NOTE — Progress Notes (Signed)
Pt tolerated right sided paracentesis and 50G of IV albumin well today and 4.5 Liters of clear yellow fluid removed. PT verbalized understanding of discharge instructions and left via wheelchair with wife with no acute distress noted.  ?

## 2021-09-20 NOTE — Procedures (Signed)
? ?  Recurrent ascites ?US guided RLQ paracentesis ? ?4.5 L yellow fluid obtained ?No labs per MD ? ?Tolerated well ? ?EBL: None ? ?

## 2021-09-21 ENCOUNTER — Ambulatory Visit (HOSPITAL_COMMUNITY): Payer: Medicaid Other | Admitting: Physical Therapy

## 2021-09-21 DIAGNOSIS — S91301S Unspecified open wound, right foot, sequela: Secondary | ICD-10-CM | POA: Diagnosis not present

## 2021-09-21 NOTE — Therapy (Cosign Needed Addendum)
[place] ?[place] ?[address] ?[place], [place], [address] ?Phone: [phone]   Fax:  [phone] ? ?Wound Care Therapy ? ?Patient Details  ?Name: Henry Johns ?MRN: [id number] ?Date of Birth: 10/12/1960 ?Referring Provider (PT): Derek Jack ? ? ?Encounter Date: 09/21/2021 ? ? PT End of Session - 09/21/21 1525   ? ? Visit Number 22   ? Number of Visits 34   ? Date for PT Re-Evaluation 84/16/60   recert for 6 weeks, 12 more visits  ? Authorization Type [employer] medicaid.   ? Authorization Time Period auth rec'd 2/15 for 3 visits, code [id number] (self care) from 2/7-3/17 with code [id number] (deb more than 20cm) not needing authorization   ? Progress Note Due on Visit 30   ? PT Start Time 2355   ? PT Stop Time 1430   ? PT Time Calculation (min) 26 min   ? Activity Tolerance Patient tolerated treatment well   ? Behavior During Therapy [facility] for tasks assessed/performed   ? ?  ?  ? ?  ? ? ?Past Medical History:  ?Diagnosis Date  ? Colon cancer ([place])   ? Diabetes mellitus without complication ([place])   ? Hypertension   ? ? ?Past Surgical History:  ?Procedure Laterality Date  ? BIOPSY  01/13/2021  ? Procedure: BIOPSY;  Surgeon: Montez Morita, Quillian Quince, MD;  [facility];  Service: Gastroenterology;;  ? CATARACT EXTRACTION Right   ? COLONOSCOPY WITH PROPOFOL N/A 01/13/2021  ? Procedure: COLONOSCOPY WITH PROPOFOL;  Surgeon: Harvel Quale, MD;  [facility];  Service: Gastroenterology;  Laterality: N/A;  11:05  ? ESOPHAGOGASTRODUODENOSCOPY (EGD) WITH PROPOFOL N/A 01/13/2021  ? Procedure: ESOPHAGOGASTRODUODENOSCOPY (EGD) WITH PROPOFOL;  Surgeon: Harvel Quale, MD;  [facility];  Service: Gastroenterology;  Laterality: N/A;  ? IR IMAGING GUIDED PORT INSERTION  01/24/2021  ? LIVER BIOPSY  01/10/2021  ? U/S guided  ? POLYPECTOMY  01/13/2021  ? Procedure: POLYPECTOMY;  Surgeon: Harvel Quale, MD;  [facility];   Service: Gastroenterology;;  ? ? ?There were no vitals filed for this visit. ? ? ? ? ? ? ? ? ? ? ? ? ? ? Wound Therapy - 09/21/21 1527   ? ? Subjective pt continues to lose weight and is weak; required use of RW to get back into wound room.   ? Patient and Family Stated Goals wound to heal   ? Date of Onset 06/14/21   ? Prior Treatments ointment, MD dressing   ? Pain Score 0-No pain   ? Evaluation and Treatment Procedures Explained to Patient/Family Yes   ? Evaluation and Treatment Procedures agreed to   ? Wound Properties Date First Assessed: 08/15/21 Time First Assessed: 0100 Wound Type: Other (Comment) Location: Foot Location Orientation: Anterior;Right;Lateral Present on Admission: Yes  ? Dressing Type Hydrogel;Impregnated gauze (bismuth)   ? Dressing Changed Changed   ? Dressing Status Old drainage   ? Dressing Change Frequency PRN   ? Site / Wound Assessment Granulation tissue   ? % Wound base Red or Granulating 100%   ? Peri-wound Assessment Intact   ? Wound Length (cm) 0.2 cm   ? Wound Width (cm) 0.2 cm   ? Wound Surface Area (cm^2) 0.04 cm^2   ? Drainage Amount Scant   ? Drainage Description Serosanguineous   ? Treatment Cleansed;Debridement (Selective)   ? Wound Properties Date First Assessed: 06/28/21 Time First Assessed: 1550 Wound Type: Other (Comment)  Location: Foot Location Orientation: Right Wound Description (Comments): dorsal wound Present on Admission: Yes  ? Dressing Type Hydrogel;Impregnated gauze (bismuth)   ? Dressing Changed Changed   ? Dressing Status Old drainage   ? Dressing Change Frequency PRN   ? Site / Wound Assessment Granulation tissue   ? % Wound base Red or Granulating 90%   ? % Wound base Yellow/Fibrinous Exudate 10%   ? Peri-wound Assessment Intact   ? Wound Length (cm) 2.2 cm   ? Wound Width (cm) 1 cm   ? Wound Surface Area (cm^2) 2.2 cm^2   ? Margins Attached edges (approximated)   ? Drainage Amount Minimal   ? Drainage Description Serosanguineous   ? Treatment  Cleansed;Debridement (Selective)   ? Selective Debridement - Location wound bed   ? Selective Debridement - Tools Used Forceps;Scalpel   ? Selective Debridement - Tissue Removed slough, biofilm.   ? Wound Therapy - Clinical Statement smaller wound lateral to primary wound nearly healed and overall both wounds are approximating well.  Middle of primary wound maintains adherent slough without any signs/symptoms of infection.  Cleansed wound well and debrided away devitalized tissue.  continued with xerform after application of vaseline to borders.  discussed discharge to self care, however pt does not have anyone that could change his dressings and care for it properly.   ? Wound Therapy - Functional Problem List Comorbidity 1;Fitness   ? Factors Delaying/Impairing Wound Healing Other (comment)   colon cancer and active chemo  ? Wound Therapy - Frequency 2X / week   ? Wound Plan continue with woundcare including debridement and dressings that promote healing environment.   ? Dressing  xerform, 4x4, ABD pad and medipore tape, netting   ? ?  ?  ? ?  ? ? ? ? ? ? ? ? ? ? ? ? PT Short Term Goals - 09/07/21 1613   ? ?  ? PT SHORT TERM GOAL #1  ? Title PT Rt dorsal wound wound to be 70% granulated   ? Status Achieved   ?  ? PT SHORT TERM GOAL #2  ? Title Pt lateral wounds on Rt LE to be healed.   ? Status On-going   ?  ? PT SHORT TERM GOAL #3  ? Title Edema to be minimal and pt to have been measured for compression garments.   ? Baseline 3/2: No edema present   ? ?  ?  ? ?  ? ? ? ? PT Long Term Goals - 09/07/21 1614   ? ?  ? PT LONG TERM GOAL #1  ? Title Wound on dorsal aspect of Rt foot to be healed   ? Status On-going   ? ?  ?  ? ?  ? ? ? ? ? ? ? ? ? ?Patient will benefit from skilled therapeutic intervention in order to improve the following deficits and impairments:    ? ?Visit Diagnosis: ?Open wound of foot, right, sequela ? ?Difficulty in walking, not elsewhere classified ? ?Localized edema ? ?Multiple open wounds of  lower leg, right, sequela ? ? ? ? ?Problem List ?Patient Active Problem List  ? Diagnosis Date Noted  ? Hypotension 09/09/2021  ? Colitis 06/01/2021  ? Hypokalemia 06/01/2021  ? Hyperlipidemia 06/01/2021  ? Chronic pain 06/01/2021  ? 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 Metastasis to liver ([place]) 01/04/2021  ? Colon cancer metastasized to liver [facility]) 01/04/2021  ? Other constipation 01/04/2021  ? Rectal bleeding 01/04/2021  ?  Nausea without vomiting 01/04/2021  ? 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 Diabetes mellitus with neuropathy ([place])   ? ?Sebastien Mcclean Sula Soda, PTA/CLT, WTA ?[phone] ? ?[name], PT CLT ?[phone]  ?09/21/2021, 5:27 PM ? ?[place] ?[place] ?[address] ?[place], [place], [address] ?Phone: [phone]   Fax:  [phone] ? ?Name: JAIMON BUGAJ ?MRN: [id number] ?Date of Birth: [birth year] ? ? ? ? ?

## 2021-09-23 ENCOUNTER — Other Ambulatory Visit (HOSPITAL_COMMUNITY): Payer: Self-pay | Admitting: Hematology

## 2021-09-25 ENCOUNTER — Inpatient Hospital Stay (HOSPITAL_BASED_OUTPATIENT_CLINIC_OR_DEPARTMENT_OTHER): Payer: Medicaid Other | Admitting: Hematology

## 2021-09-25 ENCOUNTER — Inpatient Hospital Stay (HOSPITAL_COMMUNITY): Payer: Medicaid Other

## 2021-09-25 ENCOUNTER — Encounter (HOSPITAL_COMMUNITY): Payer: Self-pay | Admitting: Hematology

## 2021-09-25 ENCOUNTER — Other Ambulatory Visit (HOSPITAL_COMMUNITY): Payer: Self-pay | Admitting: *Deleted

## 2021-09-25 ENCOUNTER — Other Ambulatory Visit: Payer: Self-pay

## 2021-09-25 DIAGNOSIS — C787 Secondary malignant neoplasm of liver and intrahepatic bile duct: Secondary | ICD-10-CM | POA: Diagnosis not present

## 2021-09-25 DIAGNOSIS — C189 Malignant neoplasm of colon, unspecified: Secondary | ICD-10-CM

## 2021-09-25 DIAGNOSIS — Z5112 Encounter for antineoplastic immunotherapy: Secondary | ICD-10-CM | POA: Diagnosis not present

## 2021-09-25 LAB — COMPREHENSIVE METABOLIC PANEL
ALT: 32 U/L (ref 0–44)
AST: 49 U/L — ABNORMAL HIGH (ref 15–41)
Albumin: 2.7 g/dL — ABNORMAL LOW (ref 3.5–5.0)
Alkaline Phosphatase: 456 U/L — ABNORMAL HIGH (ref 38–126)
Anion gap: 9 (ref 5–15)
BUN: 22 mg/dL — ABNORMAL HIGH (ref 6–20)
CO2: 23 mmol/L (ref 22–32)
Calcium: 8 mg/dL — ABNORMAL LOW (ref 8.9–10.3)
Chloride: 96 mmol/L — ABNORMAL LOW (ref 98–111)
Creatinine, Ser: 0.75 mg/dL (ref 0.61–1.24)
GFR, Estimated: >60 mL/min (ref >60)
Glucose, Bld: 191 mg/dL — ABNORMAL HIGH (ref 70–99)
Potassium: 4.3 mmol/L (ref 3.5–5.1)
Sodium: 128 mmol/L — ABNORMAL LOW (ref 135–145)
Total Bilirubin: 0.7 mg/dL (ref 0.3–1.2)
Total Protein: 5.5 g/dL — ABNORMAL LOW (ref 6.5–8.1)

## 2021-09-25 LAB — CBC WITH DIFFERENTIAL/PLATELET
Abs Immature Granulocytes: 0.08 10*3/uL — ABNORMAL HIGH (ref 0.00–0.07)
Basophils Absolute: 0 10*3/uL (ref 0.0–0.1)
Basophils Relative: 0 %
Eosinophils Absolute: 0.1 10*3/uL (ref 0.0–0.5)
Eosinophils Relative: 1 %
HCT: 27.9 % — ABNORMAL LOW (ref 39.0–52.0)
Hemoglobin: 9.1 g/dL — ABNORMAL LOW (ref 13.0–17.0)
Immature Granulocytes: 1 %
Lymphocytes Relative: 13 %
Lymphs Abs: 1.4 10*3/uL (ref 0.7–4.0)
MCH: 30.3 pg (ref 26.0–34.0)
MCHC: 32.6 g/dL (ref 30.0–36.0)
MCV: 93 fL (ref 80.0–100.0)
Monocytes Absolute: 0.9 10*3/uL (ref 0.1–1.0)
Monocytes Relative: 9 %
Neutro Abs: 8 10*3/uL — ABNORMAL HIGH (ref 1.7–7.7)
Neutrophils Relative %: 76 %
Platelets: 175 10*3/uL (ref 150–400)
RBC: 3 MIL/uL — ABNORMAL LOW (ref 4.22–5.81)
RDW: 14.3 % (ref 11.5–15.5)
WBC: 10.5 10*3/uL (ref 4.0–10.5)
nRBC: 0 % (ref 0.0–0.2)

## 2021-09-25 LAB — MAGNESIUM: Magnesium: 2 mg/dL (ref 1.7–2.4)

## 2021-09-25 MED ORDER — SODIUM CHLORIDE 0.9 % IV SOLN: 5.0000 mg/kg | Freq: Once | INTRAVENOUS | Status: DC

## 2021-09-25 MED ORDER — SODIUM CHLORIDE 0.9 % IV SOLN
350.0000 mg | Freq: Once | INTRAVENOUS | Status: AC
  Administered 2021-09-25: 350 mg via INTRAVENOUS
  Filled 2021-09-25: qty 14

## 2021-09-25 MED ORDER — SODIUM CHLORIDE 0.9 % IV SOLN
10.0000 mg | Freq: Once | INTRAVENOUS | Status: AC
  Administered 2021-09-25: 10 mg via INTRAVENOUS
  Filled 2021-09-25: qty 10

## 2021-09-25 MED ORDER — PALONOSETRON HCL INJECTION 0.25 MG/5ML
0.2500 mg | Freq: Once | INTRAVENOUS | Status: AC
  Administered 2021-09-25: 0.25 mg via INTRAVENOUS
  Filled 2021-09-25: qty 5

## 2021-09-25 MED ORDER — FLUOROURACIL CHEMO INJECTION 2.5 GM/50ML
600.0000 mg | Freq: Once | INTRAVENOUS | Status: AC
  Administered 2021-09-25: 600 mg via INTRAVENOUS
  Filled 2021-09-25: qty 12

## 2021-09-25 MED ORDER — SODIUM CHLORIDE 0.9 % IV SOLN: INTRAVENOUS | Status: DC

## 2021-09-25 MED ORDER — SODIUM CHLORIDE 0.9 % IV SOLN
400.0000 mg/m2 | Freq: Once | INTRAVENOUS | Status: AC
  Administered 2021-09-25: 784 mg via INTRAVENOUS
  Filled 2021-09-25: qty 39.2

## 2021-09-25 MED ORDER — SODIUM CHLORIDE 0.9 % IV SOLN
1920.0000 mg/m2 | INTRAVENOUS | Status: DC
  Administered 2021-09-25: 3750 mg via INTRAVENOUS
  Filled 2021-09-25: qty 75

## 2021-09-25 NOTE — Patient Instructions (Signed)
[facility] Discharge Instructions   You were seen and examined today by Dr. Katragadda.  He reviewed the results of your lab work which is normal/stable.   We will proceed with your treatment today.  Return as scheduled.    Thank you for choosing [facility] at [facility] to provide your oncology and hematology care.  To afford each patient quality time with our provider, please arrive at least 15 minutes before your scheduled appointment time.   If you have a lab appointment with the Cancer Center please come in thru the Main Entrance and check in at the main information desk.  You need to re-schedule your appointment should you arrive 10 or more minutes late.  We strive to give you quality time with our providers, and arriving late affects you and other patients whose appointments are after yours.  Also, if you no show three or more times for appointments you may be dismissed from the clinic at the providers discretion.     Again, thank you for choosing [facility].  Our hope is that these requests will decrease the amount of time that you wait before being seen by our physicians.       _____________________________________________________________  Should you have questions after your visit to [facility], please contact our office at [phone] and follow the prompts.  Our office hours are 8:00 a.m. and 4:30 p.m. Monday - Friday.  Please note that voicemails left after 4:00 p.m. may not be returned until the following business day.  We are closed weekends and major holidays.  You do have access to a nurse 24-7, just call the main number to the clinic [phone] and do not press any options, hold on the line and a nurse will answer the phone.    For prescription refill requests, have your pharmacy contact our office and allow 72 hours.    Due to Covid, you will need to wear a mask upon entering the  hospital. If you do not have a mask, a mask will be given to you at the Main Entrance upon arrival. For doctor visits, patients may have 1 support person age 18 or older with them. For treatment visits, patients can not have anyone with them due to social distancing guidelines and our immunocompromised population.      

## 2021-09-25 NOTE — Progress Notes (Signed)
Patient has been examined by Dr. Katragadda, and vital signs and labs have been reviewed. ANC, Creatinine, LFTs, hemoglobin, and platelets are within treatment parameters per M.D. - pt may proceed with treatment.    °

## 2021-09-25 NOTE — Progress Notes (Signed)
? ?[facility] ?[address] ?[place], [place] [address] ? ? ?CLINIC:  ?Medical Oncology/Hematology ? ?PCP:  ?[facility] [place] [place] [address] ?[phone] ? ? ?REASON FOR VISIT:  ?Follow-up for colon cancer metastasized to liver ? ?PRIOR THERAPY: none ? ?NGS Results: not done ? ?CURRENT THERAPY: FOLFOX every 2 weeks  ? ?BRIEF ONCOLOGIC HISTORY:  ?Oncology History Overview Note  ?MMR normal ?Foundation One: low mutation burden ?  ?Metastasis to liver [facility])  ?01/04/2021 Initial Diagnosis  ? Metastasis to liver [facility]) ?  ?01/25/2021 -  Chemotherapy  ? Patient is on Treatment Plan : COLORECTAL FOLFOX q14d  ?   ?Colon cancer metastasized to liver [facility])  ?12/26/2020 Imaging  ? Ct abdomen and pelvis ?1. Extensive hepatic metastatic disease and retroperitoneal adenopathy. ?2. A 7 mm right lung base nodule, most consistent with metastatic disease. ?3. No bowel obstruction. Normal appendix. ?4. Aortic Atherosclerosis (ICD10-I70.0). ?  ?12/26/2020 Imaging  ? CT chest ?1. Several small lung nodules consistent with metastatic disease. The primary malignancy remains unclear. The liver morphology suggests underlying cirrhosis, and multifocal hepatocellular carcinoma should be included in the differential diagnosis. ?2. No acute abnormality in the chest. ?  ?  ?01/04/2021 Initial Diagnosis  ? 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  ?01/10/2021 Pathology Results  ? A. LIVER, NEEDLE CORE BIOPSY:  ?-  Metastatic adenocarcinoma  ?-  See comment  ? ?COMMENT:  ? ?The neoplastic cells are positive for cytokeratin 20 and CDX2 but negative for cytokeratin 7, TTF-1, PSA and prostein.  The combined morphology and immunophenotype are consistent with metastasis from a  ?gastrointestinal primary.  Dr. Saralyn Pilar reviewed the case and agrees with the above diagnosis.   ?  ?01/13/2021 Procedure  ? Colonoscopy ?- Two 3 to 5 mm polyps in the transverse colon, removed with a cold snare. Resected and  retrieved. ?- Malignant partially obstructing tumor at 17 cm proximal to the anus. Biopsied. Tattooed. ?- The distal rectum and anal verge are normal on retroflexion view. ?  ?01/13/2021 Cancer Staging  ? Staging form: Colon and Rectum, AJCC 8th Edition ?- Clinical stage from 01/13/2021: Stage IVB (cTX, cN0, pM1b) - Signed by Heath Lark, MD on 01/13/2021 ?Stage prefix: Initial diagnosis ?Total positive nodes: 0 ? ?  ?01/13/2021 Procedure  ? Colonoscopy ?- Two 3 to 5 mm polyps in the transverse colon, removed with a cold snare. Resected and retrieved. ?- Malignant partially obstructing tumor at 17 cm proximal to the anus. Biopsied. Tattooed. ?- The distal rectum and anal verge are normal on retroflexion view. ?  ?01/13/2021 Procedure  ? EGD ?- Normal esophagus. ?- Z-line regular, 40 cm from the incisors. ?- Normal stomach. ?- Nodular mucosa in ?  ?01/13/2021 Pathology Results  ? A. DUODENUM, NODULE, BIOPSY:  ?- Peptic duodenitis.  ?- No dysplasia or malignancy.  ? ?B. COLON, SIGMOID, MASS, BIOPSY:  ?- Invasive adenocarcinoma.  ? ?C. COLON, TRANSVERSE, POLYPECTOMY:  ?- Fragment of ulcerated tissue with adenocarcinoma, see comment.  ?- Tubular adenoma (X2 fragments).  ? ?COMMENT:  ? ?B. MMR will be ordered ?  ?01/13/2021 Tumor Marker  ? Patient's tumor was tested for the following markers: CEA. ?Results of the tumor marker test revealed 883. ?  ?01/24/2021 Procedure  ? Successful right-sided port placement, with the tip of the catheter in the proximal right atrium. ?  ?Plan: Catheter ready for use. ?  ?See detailed procedure note with images in PACS. ?The patient  tolerated the procedure well without incident or complication and was returned to Recovery in stable conditi ?  ?01/25/2021 -  Chemotherapy  ? Patient is on Treatment Plan : COLORECTAL FOLFOX q14d  ?   ?01/25/2021 Tumor Marker  ? Patient's tumor was tested for the following markers: CEA. ?Results of the tumor marker test revealed 1378. ?  ? ? ?CANCER STAGING: ? Cancer Staging   ?Colon cancer metastasized to liver [facility]) ?Staging form: Colon and Rectum, AJCC 8th Edition ?- Clinical stage from 01/13/2021: Stage IVB (cTX, cN0, pM1b) - Signed by Heath Lark, MD on 01/13/2021 ? ? ?INTERVAL HISTORY:  ?[name], a 61 y.o. male, returns for routine follow-up and consideration for next cycle of chemotherapy. Henry Johns was last seen on 09/11/2021. ? ?Due for cycle #10 of FOLFOX today.  ? ?Overall, he tells me he has been feeling pretty well. He denies weight loss over the past 2 weeks. He reports his appetite is 50% from his baseline. He denies n/v/d/c. He reports occasional light headedness upon standing quickly. He denies current bleeding.  ? ?Overall, he feels ready for next cycle of chemo today.  ? ?REVIEW OF SYSTEMS:  ?Review of Systems  ?Constitutional:  Positive for fatigue. Negative for appetite change and unexpected weight change.  ?HENT:   Negative for nosebleeds.   ?Respiratory:  Negative for hemoptysis.   ?Gastrointestinal:  Negative for blood in stool, constipation, diarrhea, nausea and vomiting.  ?Genitourinary:  Negative for hematuria.   ?Neurological:  Positive for dizziness, light-headedness and numbness.  ?Psychiatric/Behavioral:  Positive for sleep disturbance.   ?All other systems reviewed and are negative. ? ?PAST MEDICAL/SURGICAL HISTORY:  ?Past Medical History:  ?Diagnosis Date  ? Colon cancer ([place])   ? Diabetes mellitus without complication ([place])   ? Hypertension   ? ?Past Surgical History:  ?Procedure Laterality Date  ? BIOPSY  01/13/2021  ? Procedure: BIOPSY;  Surgeon: Montez Morita, Quillian Quince, MD;  [facility];  Service: Gastroenterology;;  ? CATARACT EXTRACTION Right   ? COLONOSCOPY WITH PROPOFOL N/A 01/13/2021  ? Procedure: COLONOSCOPY WITH PROPOFOL;  Surgeon: Harvel Quale, MD;  Location: [facility];  Service: Gastroenterology;  Laterality: N/A;  11:05  ? ESOPHAGOGASTRODUODENOSCOPY (EGD) WITH PROPOFOL N/A 01/13/2021  ? Procedure:  ESOPHAGOGASTRODUODENOSCOPY (EGD) WITH PROPOFOL;  Surgeon: Harvel Quale, MD;  [facility]: [facility];  Service: Gastroenterology;  Laterality: N/A;  ? IR IMAGING GUIDED PORT INSERTION  01/24/2021  ? LIVER BIOPSY  01/10/2021  ? U/S guided  ? POLYPECTOMY  01/13/2021  ? Procedure: POLYPECTOMY;  Surgeon: Harvel Quale, MD;  [facility];  Service: Gastroenterology;;  ? ? ?SOCIAL HISTORY:  ?Social History  ? ?Socioeconomic History  ? Marital status: Married  ?  Spouse name: Not on file  ? Number of children: Not on file  ? Years of education: Not on file  ? Highest education level: Not on file  ?Occupational History  ? Not on file  ?Tobacco Use  ? Smoking status: Never  ? Smokeless tobacco: Never  ?Vaping Use  ? Vaping Use: Never used  ?Substance and Sexual Activity  ? Alcohol use: Not Currently  ? Drug use: Not Currently  ? Sexual activity: Not Currently  ?Other Topics Concern  ? Not on file  ?Social History Narrative  ? Married, no childer  ? ?Social Determinants of Health  ? ?Financial Resource Strain: High Risk  ? Difficulty of Paying Living Expenses: Very hard  ?Food Insecurity: No  Food Insecurity  ? Worried About Charity fundraiser in the Last Year: Never true  ? Ran Out of Food in the Last Year: Never true  ?Transportation Needs: No Transportation Needs  ? Lack of Transportation (Medical): No  ? Lack of Transportation (Non-Medical): No  ?Physical Activity: Inactive  ? Days of Exercise per Week: 0 days  ? Minutes of Exercise per Session: 0 min  ?Stress: Not on file  ?Social Connections: Not on file  ?Intimate Partner Violence: Not At Risk  ? Fear of Current or Ex-Partner: No  ? Emotionally Abused: No  ? Physically Abused: No  ? Sexually Abused: No  ? ? ?FAMILY HISTORY:  ?Family History  ?Problem Relation Age of Onset  ? Cancer Father 59  ?     prostate ca  ? ? ?CURRENT MEDICATIONS:  ?Current Outpatient Medications  ?Medication Sig Dispense Refill  ? acetaminophen (TYLENOL) 500 MG  tablet Take 1,000 mg by mouth every 6 (six) hours as needed for moderate pain.    ? cephALEXin (KEFLEX) 500 MG capsule Take 500 mg by mouth 3 (three) times daily.    ? fluorouracil CALGB 56314 2,400 mg/m2 in sodium chlorid

## 2021-09-25 NOTE — Progress Notes (Signed)
Mvasi brand is ok per Dr Delton Coombes and Approved with insurance by Lendell Caprice. ? ?OK to proceed with Mvasi with no urine protein today per Dr Aletha Halim. ? ?Leucovorin dose confirmed at 400 mg/m2 today per Dr Delton Coombes. ? ?Pharmacist Chemotherapy Monitoring - Follow Up Assessment   ? ?I verify that I have reviewed each item in the below checklist: ? ?Regimen for the patient is scheduled for the appropriate day and plan matches scheduled date. ?Appropriate non-routine labs are ordered dependent on drug ordered. ?If applicable, additional medications reviewed and ordered per protocol based on lifetime cumulative doses and/or treatment regimen.  ? ?Plan for follow-up and/or issues identified: No ?I-vent associated with next due treatment: No ?MD and/or nursing notified: No ?Mvasi added to today's treatment. ? ?Wynona Neat, [facility], ?09/25/2021  9:32 AM  ? ?Henreitta Leber, PharmD ?

## 2021-09-25 NOTE — Patient Instructions (Signed)
[facility]  Discharge Instructions: ?Thank you for choosing [facility] to provide your oncology and hematology care.  ?If you have a lab appointment with the [facility], please come in thru the Main Entrance and check in at the main information desk. ? ?Wear comfortable clothing and clothing appropriate for easy access to any Portacath or PICC line.  ? ?We strive to give you quality time with your provider. You may need to reschedule your appointment if you arrive late (15 or more minutes).  Arriving late affects you and other patients whose appointments are after yours.  Also, if you miss three or more appointments without notifying the office, you may be dismissed from the clinic at the provider?s discretion.    ?  ?For prescription refill requests, have your pharmacy contact our office and allow 72 hours for refills to be completed.   ? ?Today you received the following chemotherapy and/or immunotherapy agents Avastin/Leucovorin/Fluorouracil and 5FU pump connection    ?  ?To help prevent nausea and vomiting after your treatment, we encourage you to take your nausea medication as directed. ? ?BELOW ARE SYMPTOMS THAT SHOULD BE REPORTED IMMEDIATELY: ?*FEVER GREATER THAN 100.4 F (38 ?C) OR HIGHER ?*CHILLS OR SWEATING ?*NAUSEA AND VOMITING THAT IS NOT CONTROLLED WITH YOUR NAUSEA MEDICATION ?*UNUSUAL SHORTNESS OF BREATH ?*UNUSUAL BRUISING OR BLEEDING ?*URINARY PROBLEMS (pain or burning when urinating, or frequent urination) ?*BOWEL PROBLEMS (unusual diarrhea, constipation, pain near the anus) ?TENDERNESS IN MOUTH AND THROAT WITH OR WITHOUT PRESENCE OF ULCERS (sore throat, sores in mouth, or a toothache) ?UNUSUAL RASH, SWELLING OR PAIN  ?UNUSUAL VAGINAL DISCHARGE OR ITCHING  ? ?Items with * indicate a potential emergency and should be followed up as soon as possible or go to the Emergency Department if any problems should occur. ? ?Please show the CHEMOTHERAPY ALERT CARD or IMMUNOTHERAPY  ALERT CARD at check-in to the Emergency Department and triage nurse. ? ?Should you have questions after your visit or need to cancel or reschedule your appointment, please contact [facility] [phone]  and follow the prompts.  Office hours are 8:00 a.m. to 4:30 p.m. Monday - Friday. Please note that voicemails left after 4:00 p.m. may not be returned until the following business day.  We are closed weekends and major holidays. You have access to a nurse at all times for urgent questions. Please call the main number to the clinic [phone] and follow the prompts. ? ?For any non-urgent questions, you may also contact your provider using MyChart. We now offer e-Visits for anyone 23 and older to request care online for non-urgent symptoms. For details visit [email or web address]. ?  ?Also download the MyChart app! Go to the app store, search "MyChart", open the app, select [facility], and log in with your MyChart username and password. ? ?Due to Covid, a mask is required upon entering the hospital/clinic. If you do not have a mask, one will be given to you upon arrival. For doctor visits, patients may have 1 support person aged 61 or older with them. For treatment visits, patients cannot have anyone with them due to current Covid guidelines and our immunocompromised population.  ?

## 2021-09-25 NOTE — Telephone Encounter (Signed)
refill 

## 2021-09-25 NOTE — Progress Notes (Signed)
Patient presents today for Leucovorin/fluorouracil/ andd 5FU pump connection per providers order.  Vital signs and labs within parameters for treatment.  Patient stated that he has good days and bad days, but feels ok today. ? ?Per Dr. Delton Coombes okay to proceed with Avastin without a base line urine protein. ? ?Avastin/Leucovorin/ fluorouracil and 5FU pump start  given today per MD orders.  Stable during infusion without adverse affects.  Vital signs stable.  No complaints at this time.  Discharge from clinic via wheelchair in stable condition.  Alert and oriented X 3.  Follow up with [facility] as scheduled.  ?

## 2021-09-26 ENCOUNTER — Ambulatory Visit (HOSPITAL_COMMUNITY): Payer: Medicaid Other

## 2021-09-26 ENCOUNTER — Encounter (HOSPITAL_COMMUNITY): Payer: Self-pay

## 2021-09-26 DIAGNOSIS — S91301S Unspecified open wound, right foot, sequela: Secondary | ICD-10-CM | POA: Diagnosis not present

## 2021-09-26 DIAGNOSIS — R600 Localized edema: Secondary | ICD-10-CM

## 2021-09-26 DIAGNOSIS — S81801S Unspecified open wound, right lower leg, sequela: Secondary | ICD-10-CM

## 2021-09-26 DIAGNOSIS — R262 Difficulty in walking, not elsewhere classified: Secondary | ICD-10-CM

## 2021-09-26 LAB — CEA: CEA: 309 ng/mL — ABNORMAL HIGH (ref 0.0–4.7)

## 2021-09-26 NOTE — Therapy (Signed)
[facility] ?[address] ?[place], [place], [address] ?Phone: [phone]   Fax:  [phone] ? ?Wound Care Therapy ? ?Patient Details  ?Name: Henry Johns ?MRN: [id number] ?Date of Birth: 1961/05/08 ?Referring Provider (PT): Derek Jack ? ? ?Encounter Date: 09/26/2021 ? ? PT End of Session - 09/26/21 1707   ? ? Visit Number 23   ? Number of Visits 34   ? Date for PT Re-Evaluation 11/02/21   ? Authorization Type [employer] medicaid.   ? Authorization Time Period auth rec'd 2/15 for 3 visits, code [phone] (self care) from 2/7-3/17 with code [phone] (deb more than 20cm) not needing authorization   ? Progress Note Due on Visit 30   ? PT Start Time 8177   ? PT Stop Time 1165   ? PT Time Calculation (min) 25 min   ? Activity Tolerance Patient tolerated treatment well   ? Behavior During Therapy [facility] for tasks assessed/performed   ? ?  ?  ? ?  ? ? ?Past Medical History:  ?Diagnosis Date  ? Colon cancer ([place])   ? Diabetes mellitus without complication ([place])   ? Hypertension   ? ? ?Past Surgical History:  ?Procedure Laterality Date  ? BIOPSY  01/13/2021  ? Procedure: BIOPSY;  Surgeon: Montez Morita, Quillian Quince, MD;  [facility];  Service: Gastroenterology;;  ? CATARACT EXTRACTION Right   ? COLONOSCOPY WITH PROPOFOL N/A 01/13/2021  ? Procedure: COLONOSCOPY WITH PROPOFOL;  Surgeon: Harvel Quale, MD;  [facility];  Service: Gastroenterology;  Laterality: N/A;  11:05  ? ESOPHAGOGASTRODUODENOSCOPY (EGD) WITH PROPOFOL N/A 01/13/2021  ? Procedure: ESOPHAGOGASTRODUODENOSCOPY (EGD) WITH PROPOFOL;  Surgeon: Harvel Quale, MD;  [facility];  Service: Gastroenterology;  Laterality: N/A;  ? IR IMAGING GUIDED PORT INSERTION  01/24/2021  ? LIVER BIOPSY  01/10/2021  ? U/S guided  ? POLYPECTOMY  01/13/2021  ? Procedure: POLYPECTOMY;  Surgeon: Harvel Quale, MD;  [facility];  Service: Gastroenterology;;  ? ? ?There  were no vitals filed for this visit. ? ? ? Subjective Assessment - 09/26/21 1702   ? ? Subjective Pt ambulated with SPC into dept, reports foot feels good today.   ? Currently in Pain? No/denies   ? ?  ?  ? ?  ? ? ? ? ? ? ? ? ? ? ? ? Wound Therapy - 09/26/21 0001   ? ? Subjective Pt ambulated with SPC into dept, reports foot feels good today.   ? Patient and Family Stated Goals wound to heal   ? Date of Onset 06/14/21   ? Prior Treatments ointment, MD dressing   ? Pain Scale 0-10   ? Evaluation and Treatment Procedures Explained to Patient/Family Yes   ? Evaluation and Treatment Procedures agreed to   ? Wound Properties Date First Assessed: 06/28/21 Time First Assessed: 1550 Wound Type: Other (Comment) Location: Foot Location Orientation: Right Wound Description (Comments): dorsal wound Present on Admission: Yes  ? Wound Image Images linked: 1   ? Dressing Type Bismuth petroleum;Abdominal pads   xeroform, 2x2, ABD pad, vaseline perimeter with medipore tape  ? Dressing Changed Changed   ? Dressing Status Old drainage   ? Dressing Change Frequency PRN   ? Site / Wound Assessment Granulation tissue   ? % Wound base Red or Granulating 70%   ? % Wound base Yellow/Fibrinous Exudate 30%   ? Peri-wound Assessment Intact   ? Wound Length (  cm) 2.2 cm   ? Wound Width (cm) 1 cm   ? Wound Surface Area (cm^2) 2.2 cm^2   ? Margins Attached edges (approximated)   ? Drainage Amount Minimal   ? Drainage Description Serosanguineous   ? Treatment Cleansed;Debridement (Selective)   ? Selective Debridement - Location wound bed   ? Selective Debridement - Tools Used Forceps;Scalpel   ? Selective Debridement - Tissue Removed slough, biofilm.   ? Wound Therapy - Clinical Statement Lateral wound fully healed.  Continues to required selective debridment for removal of adherent slough in middle of wound bed.  Presents with improved granulation tissue perimeter.  Continued with xeroform, ABD pad to address drainage and medipore tape.   ? Wound  Therapy - Functional Problem List Comorbidity 1;Fitness   ? Factors Delaying/Impairing Wound Healing Other (comment)   colon cancer and active chemo  ? Wound Therapy - Frequency 2X / week   ? Wound Plan continue with woundcare including debridement and dressings that promote healing environment.   ? Dressing  xerform, 2x2, ABD pad and medipore tape, netting   ? ?  ?  ? ?  ? ? ? ? ? ? ? ? ? ? ? ? PT Short Term Goals - 09/07/21 1613   ? ?  ? PT SHORT TERM GOAL #1  ? Title PT Rt dorsal wound wound to be 70% granulated   ? Status Achieved   ?  ? PT SHORT TERM GOAL #2  ? Title Pt lateral wounds on Rt LE to be healed.   ? Status On-going   ?  ? PT SHORT TERM GOAL #3  ? Title Edema to be minimal and pt to have been measured for compression garments.   ? Baseline 3/2: No edema present   ? ?  ?  ? ?  ? ? ? ? PT Long Term Goals - 09/07/21 1614   ? ?  ? PT LONG TERM GOAL #1  ? Title Wound on dorsal aspect of Rt foot to be healed   ? Status On-going   ? ?  ?  ? ?  ? ? ? ? ? ? ? ? ? ?Patient will benefit from skilled therapeutic intervention in order to improve the following deficits and impairments:    ? ?Visit Diagnosis: ?Open wound of foot, right, sequela ? ?Difficulty in walking, not elsewhere classified ? ?Localized edema ? ?Multiple open wounds of lower leg, right, sequela ? ? ? ? ?Problem List ?Patient Active Problem List  ? Diagnosis Date Noted  ? Hypotension 09/09/2021  ? Colitis 06/01/2021  ? Hypokalemia 06/01/2021  ? Hyperlipidemia 06/01/2021  ? Chronic pain 06/01/2021  ? 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 Metastasis to liver ([place]) 01/04/2021  ? Colon cancer metastasized to liver [facility]) 01/04/2021  ? Other constipation 01/04/2021  ? Rectal bleeding 01/04/2021  ? Nausea without vomiting 01/04/2021  ? 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 Diabetes mellitus with neuropathy ([place])   ? ?Ihor Austin, LPTA/CLT; CBIS ?[phone] ?Aldona Lento, PTA ?09/26/2021, 5:08 PM ? ?[facility] ?[facility] ?[address] ?[place], [place], [address] ?Phone: [phone]   Fax:  [phone] ? ?Name: LEVELL TAVANO ?MRN: [id number] ?Date of Birth: 11-19-1960 ? ? ? ? ?

## 2021-09-26 NOTE — Addendum Note (Signed)
Addended by: Leeroy Cha on: 09/26/2021 12:04 PM ? ? Modules accepted: Orders ? ?

## 2021-09-27 ENCOUNTER — Inpatient Hospital Stay (HOSPITAL_COMMUNITY): Payer: Medicaid Other

## 2021-09-27 ENCOUNTER — Encounter (HOSPITAL_COMMUNITY): Payer: Self-pay

## 2021-09-27 ENCOUNTER — Other Ambulatory Visit: Payer: Self-pay

## 2021-09-27 ENCOUNTER — Ambulatory Visit (HOSPITAL_COMMUNITY)
Admission: RE | Admit: 2021-09-27 | Discharge: 2021-09-27 | Disposition: A | Discharge status: Home / Self Care | Payer: Medicaid Other | Source: Ambulatory Visit | Attending: Hematology | Admitting: Hematology

## 2021-09-27 VITALS — BP 95/66 | HR 88 | Temp 96.1°F | Resp 20

## 2021-09-27 DIAGNOSIS — R180 Malignant ascites: Secondary | ICD-10-CM | POA: Insufficient documentation

## 2021-09-27 DIAGNOSIS — Z5112 Encounter for antineoplastic immunotherapy: Secondary | ICD-10-CM | POA: Diagnosis not present

## 2021-09-27 DIAGNOSIS — C189 Malignant neoplasm of colon, unspecified: Secondary | ICD-10-CM | POA: Insufficient documentation

## 2021-09-27 DIAGNOSIS — C787 Secondary malignant neoplasm of liver and intrahepatic bile duct: Secondary | ICD-10-CM | POA: Diagnosis present

## 2021-09-27 MED ORDER — SODIUM CHLORIDE 0.9% FLUSH
10.0000 mL | INTRAVENOUS | Status: DC | PRN
  Administered 2021-09-27: 10 mL

## 2021-09-27 MED ORDER — HEPARIN SOD (PORK) LOCK FLUSH 100 UNIT/ML IV SOLN
500.0000 [IU] | Freq: Once | INTRAVENOUS | Status: AC | PRN
  Administered 2021-09-27: 500 [IU]

## 2021-09-27 MED ORDER — ALBUMIN HUMAN 25 % IV SOLN
INTRAVENOUS | Status: AC
  Administered 2021-09-27: 50 g via INTRAVENOUS
  Filled 2021-09-27: qty 200

## 2021-09-27 MED ORDER — PEGFILGRASTIM-CBQV 6 MG/0.6ML ~~LOC~~ SOSY
6.0000 mg | PREFILLED_SYRINGE | Freq: Once | SUBCUTANEOUS | Status: AC
  Administered 2021-09-27: 6 mg via SUBCUTANEOUS
  Filled 2021-09-27: qty 0.6

## 2021-09-27 MED ORDER — ALBUMIN HUMAN 25 % IV SOLN: 50.0000 g | Freq: Once | INTRAVENOUS | Status: AC

## 2021-09-27 MED ORDER — SODIUM CHLORIDE FLUSH 0.9 % IV SOLN
INTRAVENOUS | Status: AC
  Administered 2021-09-27: 10 mL
  Filled 2021-09-27: qty 10

## 2021-09-27 NOTE — Progress Notes (Signed)
Henry Johns presents to have home infusion pump d/c'd and for port-a-cath deaccess with flush.  Portacath located right chest wall accessed with  H 20 needle.  Good blood return present. Portacath flushed with NS and 500U/42m Heparin, and needle removed intact.  Procedure tolerated well and without incident.  ? Vitals stable and discharged home from clinic via wheelchair. Follow up as scheduled. ? ?

## 2021-09-27 NOTE — Procedures (Signed)
?  US guided RLQ paracentesis ? ?4 L yellow fluid obtained ?No labs per MD ? ?Tolerated well ? ?EBL: none ? ? ?

## 2021-09-27 NOTE — Progress Notes (Signed)
PT tolerated right sided paracentesis procedure and 50G of IV albumin well today and 4 Liters of clear yellow ascites removed. PT verbalized understanding of discharge instructions and left with wife via wheelchair with no acute distress noted.  ?

## 2021-09-27 NOTE — Patient Instructions (Signed)
[facility]  Discharge Instructions: Thank you for choosing [facility] to provide your oncology and hematology care.  If you have a lab appointment with the Cancer Center, please come in thru the Main Entrance and check in at the main information desk.  Wear comfortable clothing and clothing appropriate for easy access to any Portacath or PICC line.   We strive to give you quality time with your provider. You may need to reschedule your appointment if you arrive late (15 or more minutes).  Arriving late affects you and other patients whose appointments are after yours.  Also, if you miss three or more appointments without notifying the office, you may be dismissed from the clinic at the provider's discretion.      For prescription refill requests, have your pharmacy contact our office and allow 72 hours for refills to be completed.        To help prevent nausea and vomiting after your treatment, we encourage you to take your nausea medication as directed.  BELOW ARE SYMPTOMS THAT SHOULD BE REPORTED IMMEDIATELY: *FEVER GREATER THAN 100.4 F (38 C) OR HIGHER *CHILLS OR SWEATING *NAUSEA AND VOMITING THAT IS NOT CONTROLLED WITH YOUR NAUSEA MEDICATION *UNUSUAL SHORTNESS OF BREATH *UNUSUAL BRUISING OR BLEEDING *URINARY PROBLEMS (pain or burning when urinating, or frequent urination) *BOWEL PROBLEMS (unusual diarrhea, constipation, pain near the anus) TENDERNESS IN MOUTH AND THROAT WITH OR WITHOUT PRESENCE OF ULCERS (sore throat, sores in mouth, or a toothache) UNUSUAL RASH, SWELLING OR PAIN  UNUSUAL VAGINAL DISCHARGE OR ITCHING   Items with * indicate a potential emergency and should be followed up as soon as possible or go to the Emergency Department if any problems should occur.  Please show the CHEMOTHERAPY ALERT CARD or IMMUNOTHERAPY ALERT CARD at check-in to the Emergency Department and triage nurse.  Should you have questions after your visit or need to cancel  or reschedule your appointment, please contact [facility] [phone]  and follow the prompts.  Office hours are 8:00 a.m. to 4:30 p.m. Monday - Friday. Please note that voicemails left after 4:00 p.m. may not be returned until the following business day.  We are closed weekends and major holidays. You have access to a nurse at all times for urgent questions. Please call the main number to the clinic [phone] and follow the prompts.  For any non-urgent questions, you may also contact your provider using MyChart. We now offer e-Visits for anyone 18 and older to request care online for non-urgent symptoms. For details visit [email or web address].   Also download the MyChart app! Go to the app store, search "MyChart", open the app, select [facility], and log in with your MyChart username and password.  Due to Covid, a mask is required upon entering the hospital/clinic. If you do not have a mask, one will be given to you upon arrival. For doctor visits, patients may have 1 support person aged 18 or older with them. For treatment visits, patients cannot have anyone with them due to current Covid guidelines and our immunocompromised population.  

## 2021-09-28 ENCOUNTER — Ambulatory Visit (HOSPITAL_COMMUNITY): Payer: Medicaid Other

## 2021-09-28 ENCOUNTER — Encounter (HOSPITAL_COMMUNITY): Payer: Self-pay

## 2021-09-28 DIAGNOSIS — R600 Localized edema: Secondary | ICD-10-CM

## 2021-09-28 DIAGNOSIS — S91301S Unspecified open wound, right foot, sequela: Secondary | ICD-10-CM | POA: Diagnosis not present

## 2021-09-28 DIAGNOSIS — R262 Difficulty in walking, not elsewhere classified: Secondary | ICD-10-CM

## 2021-09-28 NOTE — Therapy (Signed)
[facility] ?[address] ?[place], [place], [address] ?Phone: [phone]   Fax:  [phone] ? ?Wound Care Therapy ? ?Patient Details  ?Name: Henry Johns ?MRN: [id number] ?Date of Birth: May 01, 1961 ?Referring Provider (PT): Derek Jack ? ? ?Encounter Date: 09/28/2021 ? ? PT End of Session - 09/28/21 1647   ? ? Visit Number 24   ? Number of Visits 34   ? Date for PT Re-Evaluation 11/02/21   ? Authorization Type [employer] medicaid.   ? Authorization Time Period auth rec'd 2/15 for 3 visits, code [phone] (self care) from 2/7-3/17 with code [phone] (deb more than 20cm) not needing authorization   ? Progress Note Due on Visit 30   ? PT Start Time 4917   ? PT Stop Time 1640   ? PT Time Calculation (min) 23 min   ? Activity Tolerance Patient tolerated treatment well   ? Behavior During Therapy [facility] for tasks assessed/performed   ? ?  ?  ? ?  ? ? ?Past Medical History:  ?Diagnosis Date  ? Colon cancer ([place])   ? Diabetes mellitus without complication ([place])   ? Hypertension   ? ? ?Past Surgical History:  ?Procedure Laterality Date  ? BIOPSY  01/13/2021  ? Procedure: BIOPSY;  Surgeon: Montez Morita, Quillian Quince, MD;  [facility];  Service: Gastroenterology;;  ? CATARACT EXTRACTION Right   ? COLONOSCOPY WITH PROPOFOL N/A 01/13/2021  ? Procedure: COLONOSCOPY WITH PROPOFOL;  Surgeon: Harvel Quale, MD;  [facility];  Service: Gastroenterology;  Laterality: N/A;  11:05  ? ESOPHAGOGASTRODUODENOSCOPY (EGD) WITH PROPOFOL N/A 01/13/2021  ? Procedure: ESOPHAGOGASTRODUODENOSCOPY (EGD) WITH PROPOFOL;  Surgeon: Harvel Quale, MD;  [facility];  Service: Gastroenterology;  Laterality: N/A;  ? IR IMAGING GUIDED PORT INSERTION  01/24/2021  ? LIVER BIOPSY  01/10/2021  ? U/S guided  ? POLYPECTOMY  01/13/2021  ? Procedure: POLYPECTOMY;  Surgeon: Harvel Quale, MD;  [facility];  Service: Gastroenterology;;  ? ? ?There  were no vitals filed for this visit. ? ? ? Subjective Assessment - 09/28/21 1640   ? ? Subjective Pt stated he is feeling good today, no reports of pain.  Feels weak today.   ? Currently in Pain? No/denies   ? ?  ?  ? ?  ? ? ? ? ? ? ? ? ? ? ? ? Wound Therapy - 09/28/21 0001   ? ? Subjective Pt stated he is feeling good today, no reports of pain.  Feels weak today.   ? Patient and Family Stated Goals wound to heal   ? Date of Onset 06/14/21   ? Prior Treatments ointment, MD dressing   ? Pain Scale 0-10   ? Evaluation and Treatment Procedures Explained to Patient/Family Yes   ? Evaluation and Treatment Procedures agreed to   ? Wound Properties Date First Assessed: 08/15/21 Time First Assessed: 0100 Wound Type: Other (Comment) Location: Foot Location Orientation: Anterior;Right;Lateral Present on Admission: Yes  ? Dressing Type --   vaseline perimeter, hydrogel/xeroform, 2x2, medipore tape  ? Wound Properties Date First Assessed: 06/28/21 Time First Assessed: 1550 Wound Type: Other (Comment) Location: Foot Location Orientation: Right Wound Description (Comments): dorsal wound Present on Admission: Yes  ? Wound Image Images linked: 1   ? Dressing Type Bismuth petroleum;Abdominal pads;Hydrogel   vaseline perimeter, hydrogel/xeroform, 2x2, medipore tape  ? Dressing Changed Changed   ? Dressing Status Old drainage   ?  Dressing Change Frequency PRN   ? Site / Wound Assessment Granulation tissue   ? % Wound base Red or Granulating 80%   ? % Wound base Yellow/Fibrinous Exudate 30%   ? Peri-wound Assessment Intact   ? Wound Length (cm) 1.2 cm   ? Wound Width (cm) 0.8 cm   ? Wound Surface Area (cm^2) 0.96 cm^2   ? Margins Attached edges (approximated)   ? Drainage Amount Scant   ? Drainage Description Serosanguineous   ? Treatment Cleansed;Debridement (Selective)   ? Selective Debridement - Location wound bed   ? Selective Debridement - Tools Used Forceps;Scalpel   ? Selective Debridement - Tissue Removed slough, biofilm.   ?  Wound Therapy - Clinical Statement Wound is approximating well and improved granulation following debridement for removal of slough from wound bed.  Continued with hydrogel/xeroform and medipore tape.   ? Wound Therapy - Functional Problem List Comorbidity 1;Fitness   ? Factors Delaying/Impairing Wound Healing Other (comment)   colon cancer and active chemo  ? Wound Therapy - Frequency 2X / week   ? Wound Plan continue with woundcare including debridement and dressings that promote healing environment.   ? Dressing  hydrogel, xeroform, 2x2, ABD pad and medipore tape   ? ?  ?  ? ?  ? ? ? ? ? ? ? ? ? ? ? ? PT Short Term Goals - 09/07/21 1613   ? ?  ? PT SHORT TERM GOAL #1  ? Title PT Rt dorsal wound wound to be 70% granulated   ? Status Achieved   ?  ? PT SHORT TERM GOAL #2  ? Title Pt lateral wounds on Rt LE to be healed.   ? Status On-going   ?  ? PT SHORT TERM GOAL #3  ? Title Edema to be minimal and pt to have been measured for compression garments.   ? Baseline 3/2: No edema present   ? ?  ?  ? ?  ? ? ? ? PT Long Term Goals - 09/07/21 1614   ? ?  ? PT LONG TERM GOAL #1  ? Title Wound on dorsal aspect of Rt foot to be healed   ? Status On-going   ? ?  ?  ? ?  ? ? ? ? ? ? ? ? ? ?Patient will benefit from skilled therapeutic intervention in order to improve the following deficits and impairments:    ? ?Visit Diagnosis: ?Open wound of foot, right, sequela ? ?Difficulty in walking, not elsewhere classified ? ?Localized edema ? ? ? ? ?Problem List ?Patient Active Problem List  ? Diagnosis Date Noted  ? Hypotension 09/09/2021  ? Colitis 06/01/2021  ? Hypokalemia 06/01/2021  ? Hyperlipidemia 06/01/2021  ? Chronic pain 06/01/2021  ? 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 Metastasis to liver ([place]) 01/04/2021  ? Colon cancer metastasized to liver [facility]) 01/04/2021  ? Other constipation 01/04/2021  ? Rectal bleeding 01/04/2021  ? Nausea without vomiting 01/04/2021  ? 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 Diabetes mellitus with  neuropathy ([place])   ? ?Ihor Austin, LPTA/CLT; CBIS ?[phone] ?Aldona Lento, PTA ?09/28/2021, 4:48 PM ? ?[facility] ?[address] ?[place], [place], [address] ?Phone: [phone]   Fax:  [phone] ? ?Name: Henry Johns ?MRN: [id number] ?Date of Birth: 16-Jun-1961 ? ? ? ? ?

## 2021-10-03 ENCOUNTER — Ambulatory Visit (HOSPITAL_COMMUNITY): Payer: Medicaid Other

## 2021-10-03 ENCOUNTER — Encounter (HOSPITAL_COMMUNITY): Payer: Self-pay

## 2021-10-03 ENCOUNTER — Other Ambulatory Visit: Payer: Self-pay

## 2021-10-03 DIAGNOSIS — R262 Difficulty in walking, not elsewhere classified: Secondary | ICD-10-CM

## 2021-10-03 DIAGNOSIS — S91301S Unspecified open wound, right foot, sequela: Secondary | ICD-10-CM | POA: Diagnosis not present

## 2021-10-03 NOTE — Therapy (Signed)
[facility] ?[address] ?[place], [place], [address] ?Phone: [phone]   Fax:  [phone] ? ?Wound Care Therapy ? ?Patient Details  ?Name: Henry Johns ?MRN: [id number] ?Date of Birth: [birth year] ?Referring Provider (PT): Derek Jack ? ? ?Encounter Date: 10/03/2021 ? ? PT End of Session - 10/03/21 1642   ? ? Visit Number 25   ? Number of Visits 34   ? Date for PT Re-Evaluation 11/02/21   ? Authorization Type [employer] medicaid.   ? Authorization Time Period auth rec'd 2/15 for 3 visits, code [phone] (self care) from 2/7-3/17 with code [phone] (deb more than 20cm) not needing authorization   ? Progress Note Due on Visit 30   ? PT Start Time [phone]   ? PT Stop Time 6546   ? PT Time Calculation (min) 22 min   ? Activity Tolerance Patient tolerated treatment well   ? Behavior During Therapy [facility] for tasks assessed/performed   ? ?  ?  ? ?  ? ? ?Past Medical History:  ?Diagnosis Date  ? Colon cancer ([place])   ? Diabetes mellitus without complication ([place])   ? Hypertension   ? ? ?Past Surgical History:  ?Procedure Laterality Date  ? BIOPSY  01/13/2021  ? Procedure: BIOPSY;  Surgeon: Monte[place] Morita, Quillian Quince, MD;  [facility];  Service: Gastroenterology;;  ? CATARACT EXTRACTION Right   ? COLONOSCOPY WITH PROPOFOL N/A 01/13/2021  ? Procedure: COLONOSCOPY WITH PROPOFOL;  Surgeon: Harvel Quale, MD;  [facility];  Service: Gastroenterology;  Laterality: N/A;  11:05  ? ESOPHAGOGASTRODUODENOSCOPY (EGD) WITH PROPOFOL N/A 01/13/2021  ? Procedure: ESOPHAGOGASTRODUODENOSCOPY (EGD) WITH PROPOFOL;  Surgeon: Harvel Quale, MD;  [facility];  Service: Gastroenterology;  Laterality: N/A;  ? IR IMAGING GUIDED PORT INSERTION  01/24/2021  ? LIVER BIOPSY  01/10/2021  ? U/S guided  ? POLYPECTOMY  01/13/2021  ? Procedure: POLYPECTOMY;  Surgeon: Harvel Quale, MD;  [facility];  Service: Gastroenterology;;  ? ? ?There  were no vitals filed for this visit. ? ? ? Subjective Assessment - 10/03/21 1638   ? ? Subjective Pt arrived with dressings intact, no reports of pain.  Feels weak today.  Requested WC at entrance.   ? ?  ?  ? ?  ? ? ? ? ? ? ? ? ? ? ? ? Wound Therapy - 10/03/21 0001   ? ? Subjective Pt stated he is feeling good today, no reports of pain.  Feels weak today.   ? Patient and Family Stated Goals wound to heal   ? Date of Onset 06/14/21   ? Prior Treatments ointment, MD dressing   ? Pain Scale 0-10   ? Evaluation and Treatment Procedures Explained to Patient/Family Yes   ? Evaluation and Treatment Procedures agreed to   ? Wound Properties Date First Assessed: 06/28/21 Time First Assessed: 1550 Wound Type: Other (Comment) Location: Foot Location Orientation: Right Wound Description (Comments): dorsal wound Present on Admission: Yes  ? Wound Image Images linked: 1   ? Dressing Type Bismuth petroleum;Abdominal pads;Hydrogel   ? Dressing Changed Changed   ? Dressing Status Old drainage   ? Dressing Change Frequency PRN   ? Site / Wound Assessment Granulation tissue   ? % Wound base Red or Granulating 85%   ? % Wound base Yellow/Fibrinous Exudate 15%   ? Peri-wound Assessment Intact   ? Wound Length (cm) 1 cm   ?  Wound Width (cm) 0.7 cm   ? Wound Surface Area (cm^2) 0.7 cm^2   ? Margins Attached edges (approximated)   ? Drainage Amount Scant   ? Drainage Description Serosanguineous   ? Treatment Cleansed;Debridement (Selective)   ? Selective Debridement - Location wound bed   ? Selective Debridement - Tools Used Forceps;Scalpel   ? Selective Debridement - Tissue Removed slough, biofilm.   ? Wound Therapy - Clinical Statement Wound continues to approximate with slough in wound bed.  Improved granulation tissues following selective debridmeent.  Continued with hydrogel/xeroform, vaseline perimeter and medipore tape.   ? Wound Therapy - Functional Problem List Comorbidity 1;Fitness   ? Factors Delaying/Impairing Wound Healing  Other (comment)   colon cancer and active chemo  ? Wound Therapy - Frequency 2X / week   ? Wound Plan continue with woundcare including debridement and dressings that promote healing environment.   ? Dressing  hydrogel, xeroform, 2x2, ABD pad and medipore tape   ? ?  ?  ? ?  ? ? ? ? ? ? ? ? ? ? ? ? PT Short Term Goals - 09/07/21 1613   ? ?  ? PT SHORT TERM GOAL #1  ? Title PT Rt dorsal wound wound to be 70% granulated   ? Status Achieved   ?  ? PT SHORT TERM GOAL #2  ? Title Pt lateral wounds on Rt LE to be healed.   ? Status On-going   ?  ? PT SHORT TERM GOAL #3  ? Title Edema to be minimal and pt to have been measured for compression garments.   ? Baseline 3/2: No edema present   ? ?  ?  ? ?  ? ? ? ? PT Long Term Goals - 09/07/21 1614   ? ?  ? PT LONG TERM GOAL #1  ? Title Wound on dorsal aspect of Rt foot to be healed   ? Status On-going   ? ?  ?  ? ?  ? ? ? ? ? ? ? ? ? ?Patient will benefit from skilled therapeutic intervention in order to improve the following deficits and impairments:    ? ?Visit Diagnosis: ?Open wound of foot, right, sequela ? ?Difficulty in walking, not elsewhere classified ? ? ? ? ?Problem List ?Patient Active Problem List  ? Diagnosis Date Noted  ? Hypotension 09/09/2021  ? Colitis 06/01/2021  ? Hypokalemia 06/01/2021  ? Hyperlipidemia 06/01/2021  ? Chronic pain 06/01/2021  ? 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 Metastasis to liver ([place]) 01/04/2021  ? Colon cancer metastasized to liver [facility]) 01/04/2021  ? Other constipation 01/04/2021  ? Rectal bleeding 01/04/2021  ? Nausea without vomiting 01/04/2021  ? 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 Diabetes mellitus with neuropathy ([place])   ? ?Henry Johns, LPTA/CLT; CBIS ?[place] ? ?Henry Johns, Henry Johns ?10/03/2021, 4:44 PM ? ?[facility] ?[address] ?[place], [place], [address] ?Phone: [phone]   Fax:  [phone] ? ?Name: Henry Johns ?MRN: [id number] ?Date of Birth:  04/21/1961 ? ? ? ? ?

## 2021-10-04 ENCOUNTER — Ambulatory Visit (HOSPITAL_COMMUNITY)
Admission: RE | Admit: 2021-10-04 | Discharge: 2021-10-04 | Disposition: A | Discharge status: Home / Self Care | Payer: Medicaid Other | Source: Ambulatory Visit | Attending: Hematology | Admitting: Hematology

## 2021-10-04 ENCOUNTER — Encounter (HOSPITAL_COMMUNITY): Payer: Self-pay

## 2021-10-04 DIAGNOSIS — C189 Malignant neoplasm of colon, unspecified: Secondary | ICD-10-CM | POA: Diagnosis present

## 2021-10-04 DIAGNOSIS — C787 Secondary malignant neoplasm of liver and intrahepatic bile duct: Secondary | ICD-10-CM | POA: Insufficient documentation

## 2021-10-04 DIAGNOSIS — R180 Malignant ascites: Secondary | ICD-10-CM | POA: Diagnosis not present

## 2021-10-04 MED ORDER — ALBUMIN HUMAN 25 % IV SOLN: 50.0000 g | Freq: Once | INTRAVENOUS | Status: AC

## 2021-10-04 MED ORDER — ALBUMIN HUMAN 25 % IV SOLN
INTRAVENOUS | Status: AC
  Administered 2021-10-04: 50 g via INTRAVENOUS
  Filled 2021-10-04: qty 200

## 2021-10-04 MED ORDER — SODIUM CHLORIDE FLUSH 0.9 % IV SOLN
INTRAVENOUS | Status: AC
  Administered 2021-10-04: 10 mL
  Filled 2021-10-04: qty 10

## 2021-10-04 NOTE — Procedures (Signed)
? ?  US guided LLQ paracentesis ? ?4 liters yellow fluid obtained ?No labs per MD ? ?Tolerated well ?EBL: None ?

## 2021-10-04 NOTE — Progress Notes (Signed)
Patient tolerated left side paracentesis procedure and 50G of IV albumin well today and 4 Liters of clear yellow fluid removed. PT verbalized understanding of discharge instructions and left with wife via wheelchair with no acute distress noted.  ?

## 2021-10-05 ENCOUNTER — Ambulatory Visit (HOSPITAL_COMMUNITY): Payer: Medicaid Other

## 2021-10-05 ENCOUNTER — Encounter (HOSPITAL_COMMUNITY): Payer: Self-pay

## 2021-10-05 DIAGNOSIS — S91301S Unspecified open wound, right foot, sequela: Secondary | ICD-10-CM | POA: Diagnosis not present

## 2021-10-05 DIAGNOSIS — R262 Difficulty in walking, not elsewhere classified: Secondary | ICD-10-CM

## 2021-10-05 NOTE — Therapy (Signed)
[facility] ?[address] ?[place], [place], [address] ?Phone: [phone]   Fax:  [phone] ? ?Wound Care Therapy ? ?Patient Details  ?Name: Henry Johns ?MRN: [id number] ?Date of Birth: September 01, 1960 ?Referring Provider (PT): Derek Jack ? ? ?Encounter Date: 10/05/2021 ? ? PT End of Session - 10/05/21 1532   ? ? Visit Number 26   ? Number of Visits 34   ? Date for PT Re-Evaluation 11/02/21   ? Authorization Type [employer] medicaid.   ? Authorization Time Period auth rec'd 2/15 for 3 visits, code [phone] (self care) from 2/7-3/17 with code [phone] (deb more than 20cm) not needing authorization   ? Progress Note Due on Visit 30   ? PT Start Time 1452   ? PT Stop Time 1515   ? PT Time Calculation (min) 23 min   ? Activity Tolerance Patient tolerated treatment well   ? Behavior During Therapy [facility] for tasks assessed/performed   ? ?  ?  ? ?  ? ? ?Past Medical History:  ?Diagnosis Date  ? Colon cancer ([place])   ? Diabetes mellitus without complication ([place])   ? Hypertension   ? ? ?Past Surgical History:  ?Procedure Laterality Date  ? BIOPSY  01/13/2021  ? Procedure: BIOPSY;  Surgeon: Monte[place] Morita, Quillian Quince, MD;  [facility];  Service: Gastroenterology;;  ? CATARACT EXTRACTION Right   ? COLONOSCOPY WITH PROPOFOL N/A 01/13/2021  ? Procedure: COLONOSCOPY WITH PROPOFOL;  Surgeon: Harvel Quale, MD;  [facility];  Service: Gastroenterology;  Laterality: N/A;  11:05  ? ESOPHAGOGASTRODUODENOSCOPY (EGD) WITH PROPOFOL N/A 01/13/2021  ? Procedure: ESOPHAGOGASTRODUODENOSCOPY (EGD) WITH PROPOFOL;  Surgeon: Harvel Quale, MD;  [facility];  Service: Gastroenterology;  Laterality: N/A;  ? IR IMAGING GUIDED PORT INSERTION  01/24/2021  ? LIVER BIOPSY  01/10/2021  ? U/S guided  ? POLYPECTOMY  01/13/2021  ? Procedure: POLYPECTOMY;  Surgeon: Harvel Quale, MD;  [facility];  Service: Gastroenterology;;  ? ? ?There  were no vitals filed for this visit. ? ? ? Subjective Assessment - 10/05/21 1520   ? ? Subjective Pt stated he is feeling weak, requested WC at entrance.  Was unable to get apts in the next couple weeks.   ? Currently in Pain? No/denies   ? ?  ?  ? ?  ? ? ? ? ? ? ? ? ? ? ? ? Wound Therapy - 10/05/21 0001   ? ? Subjective Pt stated he is feeling weak, requested WC at entrance.  Was unable to get apts in the next couple weeks.   ? Patient and Family Stated Goals wound to heal   ? Date of Onset 06/14/21   ? Prior Treatments ointment, MD dressing   ? Pain Scale 0-10   ? Evaluation and Treatment Procedures Explained to Patient/Family Yes   ? Evaluation and Treatment Procedures agreed to   ? Wound Properties Date First Assessed: 06/28/21 Time First Assessed: 1550 Wound Type: Other (Comment) Location: Foot Location Orientation: Right Wound Description (Comments): dorsal wound Present on Admission: Yes  ? Wound Image Images linked: 1   ? Dressing Type Bismuth petroleum;Abdominal pads;Hydrogel   ? Dressing Changed Changed   ? Dressing Status Old drainage   ? Dressing Change Frequency PRN   ? Site / Wound Assessment Granulation tissue   ? % Wound base Red or Granulating 90%   ? % Wound base Yellow/Fibrinous Exudate 10%   ?  Peri-wound Assessment Intact   ? Wound Length (cm) 0.8 cm   was 4.8  ? Wound Width (cm) 0.6 cm   was 5  ? Wound Depth (cm) 0.1 cm   ? Wound Volume (cm^3) 0.05 cm^3   ? Wound Surface Area (cm^2) 0.48 cm^2   ? Margins Attached edges (approximated)   ? Drainage Amount Scant   ? Drainage Description Serosanguineous   ? Treatment Cleansed;Debridement (Selective)   ? Selective Debridement - Location wound bed   ? Selective Debridement - Tools Used Forceps;Scalpel   ? Selective Debridement - Tissue Removed slough, biofilm.   ? Wound Therapy - Clinical Statement Wound continues to reduce in size and granulation following debridement.  Pt reports he is unable to schedule apt in the next couple weeks.  Pt instructed  care of the wound and pt scheduled 1 more apt in a couple weeks to followup on wound care, will cancel if feels comfortable at home.   ? Wound Therapy - Functional Problem List Comorbidity 1;Fitness   ? Factors Delaying/Impairing Wound Healing Other (comment)   colon cancer and active chemo  ? Wound Therapy - Frequency 2X / week   ? Wound Plan Self care with 1 followup in a couple of weeks if needed, cancel if not.  Pt instructed self care and appropriate dressings to complete at home.   ? Dressing  hydrogel, xeroform, 2x2, ABD pad and medipore tape   ? ?  ?  ? ?  ? ? ? ? ? ? ? ? ? ? ? ? PT Short Term Goals - 10/05/21 1531   ? ?  ? PT SHORT TERM GOAL #1  ? Title PT Rt dorsal wound wound to be 70% granulated   ? Status Achieved   ?  ? PT SHORT TERM GOAL #2  ? Title Pt lateral wounds on Rt LE to be healed.   ? Status On-going   ?  ? PT SHORT TERM GOAL #3  ? Title Edema to be minimal and pt to have been measured for compression garments.   ? Baseline 3/2: No edema present   ? Status Achieved   ? ?  ?  ? ?  ? ? ? ? PT Long Term Goals - 10/05/21 1531   ? ?  ? PT LONG TERM GOAL #1  ? Title Wound on dorsal aspect of Rt foot to be healed   ? Status On-going   ? [place] Date 08/09/21   ? ?  ?  ? ?  ? ? ? ? ? ? ? ? ? ?Patient will benefit from skilled therapeutic intervention in order to improve the following deficits and impairments:    ? ?Visit Diagnosis: ?Open wound of foot, right, sequela ? ?Difficulty in walking, not elsewhere classified ? ? ? ? ?Problem List ?Patient Active Problem List  ? Diagnosis Date Noted  ? Hypotension 09/09/2021  ? Colitis 06/01/2021  ? Hypokalemia 06/01/2021  ? Hyperlipidemia 06/01/2021  ? Chronic pain 06/01/2021  ? 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 Metastasis to liver ([place]) 01/04/2021  ? Colon cancer metastasized to liver [facility]) 01/04/2021  ? Other constipation 01/04/2021  ? Rectal bleeding 01/04/2021  ? Nausea without vomiting 01/04/2021  ? 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 Diabetes  mellitus with neuropathy ([place])   ? ?Henry Johns, LPTA/CLT; CBIS ?[phone] ? ?Henry Johns, PTA ?10/05/2021, 3:34 PM ? ?[facility] ?[facility] ?[address] ?[place], [place], [address] ?Phone: [phone]  Fax:  [phone] ? ?Name: Henry Johns ?MRN: [id number] ?Date of Birth: 13-Jun-1961 ? ? ? ? ?

## 2021-10-09 ENCOUNTER — Inpatient Hospital Stay (HOSPITAL_COMMUNITY): Payer: Medicaid Other | Attending: Hematology

## 2021-10-09 ENCOUNTER — Inpatient Hospital Stay (HOSPITAL_BASED_OUTPATIENT_CLINIC_OR_DEPARTMENT_OTHER): Payer: Medicaid Other | Admitting: Hematology

## 2021-10-09 ENCOUNTER — Inpatient Hospital Stay (HOSPITAL_COMMUNITY): Payer: Medicaid Other

## 2021-10-09 VITALS — BP 94/70 | HR 89 | Temp 97.0°F | Resp 17 | Ht 68.0 in | Wt 139.6 lb

## 2021-10-09 VITALS — BP 106/75 | HR 80 | Temp 96.9°F | Resp 18 | Wt 143.8 lb

## 2021-10-09 DIAGNOSIS — Z5111 Encounter for antineoplastic chemotherapy: Secondary | ICD-10-CM | POA: Diagnosis not present

## 2021-10-09 DIAGNOSIS — R180 Malignant ascites: Secondary | ICD-10-CM | POA: Insufficient documentation

## 2021-10-09 DIAGNOSIS — C787 Secondary malignant neoplasm of liver and intrahepatic bile duct: Secondary | ICD-10-CM | POA: Diagnosis not present

## 2021-10-09 DIAGNOSIS — C189 Malignant neoplasm of colon, unspecified: Secondary | ICD-10-CM | POA: Diagnosis not present

## 2021-10-09 DIAGNOSIS — C187 Malignant neoplasm of sigmoid colon: Secondary | ICD-10-CM | POA: Insufficient documentation

## 2021-10-09 DIAGNOSIS — Z5112 Encounter for antineoplastic immunotherapy: Secondary | ICD-10-CM | POA: Diagnosis present

## 2021-10-09 DIAGNOSIS — Z5189 Encounter for other specified aftercare: Secondary | ICD-10-CM | POA: Insufficient documentation

## 2021-10-09 DIAGNOSIS — Z79899 Other long term (current) drug therapy: Secondary | ICD-10-CM | POA: Insufficient documentation

## 2021-10-09 DIAGNOSIS — E875 Hyperkalemia: Secondary | ICD-10-CM

## 2021-10-09 LAB — CBC WITH DIFFERENTIAL/PLATELET
Band Neutrophils: 1 %
Basophils Absolute: 0.1 10*3/uL (ref 0.0–0.1)
Basophils Relative: 1 %
Blasts: 1 %
Eosinophils Absolute: 0 10*3/uL (ref 0.0–0.5)
Eosinophils Relative: 0 %
HCT: 31.6 % — ABNORMAL LOW (ref 39.0–52.0)
Hemoglobin: 10.5 g/dL — ABNORMAL LOW (ref 13.0–17.0)
Lymphocytes Relative: 22 %
Lymphs Abs: 3.1 10*3/uL (ref 0.7–4.0)
MCH: 30.1 pg (ref 26.0–34.0)
MCHC: 33.2 g/dL (ref 30.0–36.0)
MCV: 90.5 fL (ref 80.0–100.0)
Metamyelocytes Relative: 2 %
Monocytes Absolute: 0.6 10*3/uL (ref 0.1–1.0)
Monocytes Relative: 4 %
Myelocytes: 1 %
Neutro Abs: 9.6 10*3/uL — ABNORMAL HIGH (ref 1.7–7.7)
Neutrophils Relative %: 68 %
Platelets: 130 10*3/uL — ABNORMAL LOW (ref 150–400)
RBC: 3.49 MIL/uL — ABNORMAL LOW (ref 4.22–5.81)
RDW: 15.5 % (ref 11.5–15.5)
WBC: 13.9 10*3/uL — ABNORMAL HIGH (ref 4.0–10.5)
nRBC: 0 % (ref 0.0–0.2)

## 2021-10-09 LAB — COMPREHENSIVE METABOLIC PANEL
ALT: 20 U/L (ref 0–44)
AST: 29 U/L (ref 15–41)
Albumin: 3 g/dL — ABNORMAL LOW (ref 3.5–5.0)
Alkaline Phosphatase: 315 U/L — ABNORMAL HIGH (ref 38–126)
Anion gap: 9 (ref 5–15)
BUN: 20 mg/dL (ref 6–20)
CO2: 22 mmol/L (ref 22–32)
Calcium: 8.1 mg/dL — ABNORMAL LOW (ref 8.9–10.3)
Chloride: 101 mmol/L (ref 98–111)
Creatinine, Ser: 0.74 mg/dL (ref 0.61–1.24)
GFR, Estimated: >60 mL/min (ref >60)
Glucose, Bld: 126 mg/dL — ABNORMAL HIGH (ref 70–99)
Potassium: 3 mmol/L — ABNORMAL LOW (ref 3.5–5.1)
Sodium: 132 mmol/L — ABNORMAL LOW (ref 135–145)
Total Bilirubin: 1 mg/dL (ref 0.3–1.2)
Total Protein: 5.6 g/dL — ABNORMAL LOW (ref 6.5–8.1)

## 2021-10-09 LAB — URINALYSIS, DIPSTICK ONLY
Bilirubin Urine: NEGATIVE
Glucose, UA: NEGATIVE mg/dL
Hgb urine dipstick: NEGATIVE
Ketones, ur: NEGATIVE mg/dL
Leukocytes,Ua: NEGATIVE
Nitrite: NEGATIVE
Protein, ur: NEGATIVE mg/dL
Specific Gravity, Urine: 1.016 (ref 1.005–1.030)
pH: 5 (ref 5.0–8.0)

## 2021-10-09 LAB — MAGNESIUM: Magnesium: 1.9 mg/dL (ref 1.7–2.4)

## 2021-10-09 MED ORDER — MIDODRINE HCL 10 MG PO TABS: 10.0000 mg | ORAL_TABLET | Freq: Three times a day (TID) | ORAL | 4 refills | Status: DC

## 2021-10-09 MED ORDER — PALONOSETRON HCL INJECTION 0.25 MG/5ML
0.2500 mg | Freq: Once | INTRAVENOUS | Status: AC
  Administered 2021-10-09: 0.25 mg via INTRAVENOUS
  Filled 2021-10-09: qty 5

## 2021-10-09 MED ORDER — POTASSIUM CHLORIDE CRYS ER 10 MEQ PO TBCR: 20.0000 meq | EXTENDED_RELEASE_TABLET | Freq: Every day | ORAL | 6 refills | Status: DC

## 2021-10-09 MED ORDER — SODIUM CHLORIDE 0.9% FLUSH
10.0000 mL | INTRAVENOUS | Status: DC | PRN
  Administered 2021-10-09: 10 mL

## 2021-10-09 MED ORDER — POTASSIUM CHLORIDE CRYS ER 20 MEQ PO TBCR
40.0000 meq | EXTENDED_RELEASE_TABLET | Freq: Once | ORAL | Status: AC
  Administered 2021-10-09: 40 meq via ORAL
  Filled 2021-10-09: qty 2

## 2021-10-09 MED ORDER — SODIUM CHLORIDE 0.9 % IV SOLN
10.0000 mg | Freq: Once | INTRAVENOUS | Status: AC
  Administered 2021-10-09: 10 mg via INTRAVENOUS
  Filled 2021-10-09: qty 10

## 2021-10-09 MED ORDER — SODIUM CHLORIDE 0.9 % IV SOLN
700.0000 mg | Freq: Once | INTRAVENOUS | Status: AC
  Administered 2021-10-09: 700 mg via INTRAVENOUS
  Filled 2021-10-09: qty 35

## 2021-10-09 MED ORDER — SODIUM CHLORIDE 0.9 % IV SOLN
350.0000 mg | Freq: Once | INTRAVENOUS | Status: AC
  Administered 2021-10-09: 350 mg via INTRAVENOUS
  Filled 2021-10-09: qty 14

## 2021-10-09 MED ORDER — SODIUM CHLORIDE 0.9 % IV SOLN: Freq: Once | INTRAVENOUS | Status: AC

## 2021-10-09 MED ORDER — SODIUM CHLORIDE 0.9 % IV SOLN
3400.0000 mg | INTRAVENOUS | Status: DC
  Administered 2021-10-09: 3400 mg via INTRAVENOUS
  Filled 2021-10-09: qty 68

## 2021-10-09 MED ORDER — FLUOROURACIL CHEMO INJECTION 2.5 GM/50ML
600.0000 mg | Freq: Once | INTRAVENOUS | Status: AC
  Administered 2021-10-09: 600 mg via INTRAVENOUS
  Filled 2021-10-09: qty 12

## 2021-10-09 NOTE — Patient Instructions (Signed)
[facility]  Discharge Instructions: ?Thank you for choosing [facility] to provide your oncology and hematology care.  ?If you have a lab appointment with the [facility], please come in thru the Main Entrance and check in at the main information desk. ? ?Wear comfortable clothing and clothing appropriate for easy access to any Portacath or PICC line.  ? ?We strive to give you quality time with your provider. You may need to reschedule your appointment if you arrive late (15 or more minutes).  Arriving late affects you and other patients whose appointments are after yours.  Also, if you miss three or more appointments without notifying the office, you may be dismissed from the clinic at the provider?s discretion.    ?  ?For prescription refill requests, have your pharmacy contact our office and allow 72 hours for refills to be completed.   ? ?Today you received the following chemotherapy and/or immunotherapy agents Leucovorin and MVASI, return as scheduled. ?  ?To help prevent nausea and vomiting after your treatment, we encourage you to take your nausea medication as directed. ? ?BELOW ARE SYMPTOMS THAT SHOULD BE REPORTED IMMEDIATELY: ?*FEVER GREATER THAN 100.4 F (38 ?C) OR HIGHER ?*CHILLS OR SWEATING ?*NAUSEA AND VOMITING THAT IS NOT CONTROLLED WITH YOUR NAUSEA MEDICATION ?*UNUSUAL SHORTNESS OF BREATH ?*UNUSUAL BRUISING OR BLEEDING ?*URINARY PROBLEMS (pain or burning when urinating, or frequent urination) ?*BOWEL PROBLEMS (unusual diarrhea, constipation, pain near the anus) ?TENDERNESS IN MOUTH AND THROAT WITH OR WITHOUT PRESENCE OF ULCERS (sore throat, sores in mouth, or a toothache) ?UNUSUAL RASH, SWELLING OR PAIN  ?UNUSUAL VAGINAL DISCHARGE OR ITCHING  ? ?Items with * indicate a potential emergency and should be followed up as soon as possible or go to the Emergency Department if any problems should occur. ? ?Please show the CHEMOTHERAPY ALERT CARD or IMMUNOTHERAPY ALERT CARD at  check-in to the Emergency Department and triage nurse. ? ?Should you have questions after your visit or need to cancel or reschedule your appointment, please contact [facility] [phone]  and follow the prompts.  Office hours are 8:00 a.m. to 4:30 p.m. Monday - Friday. Please note that voicemails left after 4:00 p.m. may not be returned until the following business day.  We are closed weekends and major holidays. You have access to a nurse at all times for urgent questions. Please call the main number to the clinic [phone] and follow the prompts. ? ?For any non-urgent questions, you may also contact your provider using MyChart. We now offer e-Visits for anyone 80 and older to request care online for non-urgent symptoms. For details visit [email or web address] ?  ?Also download the MyChart app! Go to the app store, search "MyChart", open the app, select [facility], and log in with your MyChart username and password. ? ?Due to Covid, a mask is required upon entering the hospital/clinic. If you do not have a mask, one will be given to you upon arrival. For doctor visits, patients may have 1 support person aged 62 or older with them. For treatment visits, patients cannot have anyone with them due to current Covid guidelines and our immunocompromised population.  ?

## 2021-10-09 NOTE — Progress Notes (Signed)
Patient presents today for treatment. Patient has lost 8lbs since previous treatment due to fluid drawn off. Patient and labs assessed by Dr. Delton Coombes. Patient tolerated chemotherapy with no complaints voiced. Side effects with management reviewed understanding verbalized. Port site clean and dry with no bruising or swelling noted at site. Good blood return noted before and after administration of chemotherapy. Chemo pump connected with no alarms noted. Patient left in satisfactory condition with VSS and no s/s of distress noted.  ?

## 2021-10-09 NOTE — Patient Instructions (Addendum)
[place] at [facility] ?Discharge Instructions ? ? ?You were seen and examined today by Dr. Delton Coombes. ? ?He reviewed your lab work which is normal/stable except for your potassium.  It is low at 3.0 today.  We will give you potassium pills in the clinic today to correct this.  ? ?We will proceed with your treatment today. ? ?Return as scheduled in 2 weeks.  ? ? ?Thank you for choosing [place] at [facility] to provide your oncology and hematology care.  To afford each patient quality time with our provider, please arrive at least 15 minutes before your scheduled appointment time.  ? ?If you have a lab appointment with the [facility] please come in thru the Main Entrance and check in at the main information desk. ? ?You need to re-schedule your appointment should you arrive 10 or more minutes late.  We strive to give you quality time with our providers, and arriving late affects you and other patients whose appointments are after yours.  Also, if you no show three or more times for appointments you may be dismissed from the clinic at the providers discretion.     ?Again, thank you for choosing [facility].  Our hope is that these requests will decrease the amount of time that you wait before being seen by our physicians.       ?_____________________________________________________________ ? ?Should you have questions after your visit to [facility], please contact our office at [phone] and follow the prompts.  Our office hours are 8:00 a.m. and 4:30 p.m. Monday - Friday.  Please note that voicemails left after 4:00 p.m. may not be returned until the following business day.  We are closed weekends and major holidays.  You do have access to a nurse 24-7, just call the main number to the clinic [phone] and do not press any options, hold on the line and a nurse will answer the phone.   ? ?For prescription refill requests, have  your pharmacy contact our office and allow 72 hours.   ? ?Due to Covid, you will need to wear a mask upon entering the hospital. If you do not have a mask, a mask will be given to you at the Main Entrance upon arrival. For doctor visits, patients may have 1 support person age 1 or older with them. For treatment visits, patients can not have anyone with them due to social distancing guidelines and our immunocompromised population.  ? ?   ?

## 2021-10-09 NOTE — Progress Notes (Signed)
Patient has been examined by Dr. Katragadda, and vital signs and labs have been reviewed. ANC, Creatinine, LFTs, hemoglobin, and platelets are within treatment parameters per M.D. - pt may proceed with treatment.    °

## 2021-10-09 NOTE — Progress Notes (Signed)
? ?[facility] ?[address] ?[place], [place] [address] ? ? ?CLINIC:  ?Medical Oncology/Hematology ? ?PCP:  ?[facility] [place] [address] ?[phone] ? ? ?REASON FOR VISIT:  ?Follow-up for colon cancer metastasized to liver ? ?PRIOR THERAPY: none ? ?NGS Results: not done ? ?CURRENT THERAPY: colon cancer metastasized to liver ? ?BRIEF ONCOLOGIC HISTORY:  ?Oncology History Overview Note  ?MMR normal ?Foundation One: low mutation burden ?  ?Metastasis to liver [facility])  ?01/04/2021 Initial Diagnosis  ? Metastasis to liver [facility]) ?  ?01/25/2021 -  Chemotherapy  ? Patient is on Treatment Plan : COLORECTAL FOLFOX q14d  ?   ?Colon cancer metastasized to liver [facility])  ?12/26/2020 Imaging  ? Ct abdomen and pelvis ?1. Extensive hepatic metastatic disease and retroperitoneal adenopathy. ?2. A 7 mm right lung base nodule, most consistent with metastatic disease. ?3. No bowel obstruction. Normal appendix. ?4. Aortic Atherosclerosis (ICD10-I70.0). ?  ?12/26/2020 Imaging  ? CT chest ?1. Several small lung nodules consistent with metastatic disease. The primary malignancy remains unclear. The liver morphology suggests underlying cirrhosis, and multifocal hepatocellular carcinoma should be included in the differential diagnosis. ?2. No acute abnormality in the chest. ?  ?  ?01/04/2021 Initial Diagnosis  ? 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  ?01/10/2021 Pathology Results  ? A. LIVER, NEEDLE CORE BIOPSY:  ?-  Metastatic adenocarcinoma  ?-  See comment  ? ?COMMENT:  ? ?The neoplastic cells are positive for cytokeratin 20 and CDX2 but negative for cytokeratin 7, TTF-1, PSA and prostein.  The combined morphology and immunophenotype are consistent with metastasis from a  ?gastrointestinal primary.  Dr. Saralyn Pilar reviewed the case and agrees with the above diagnosis.   ?  ?01/13/2021 Procedure  ? Colonoscopy ?- Two 3 to 5 mm polyps in the transverse colon, removed with a cold snare. Resected  and retrieved. ?- Malignant partially obstructing tumor at 17 cm proximal to the anus. Biopsied. Tattooed. ?- The distal rectum and anal verge are normal on retroflexion view. ?  ?01/13/2021 Cancer Staging  ? Staging form: Colon and Rectum, AJCC 8th Edition ?- Clinical stage from 01/13/2021: Stage IVB (cTX, cN0, pM1b) - Signed by Heath Lark, MD on 01/13/2021 ?Stage prefix: Initial diagnosis ?Total positive nodes: 0 ? ?  ?01/13/2021 Procedure  ? Colonoscopy ?- Two 3 to 5 mm polyps in the transverse colon, removed with a cold snare. Resected and retrieved. ?- Malignant partially obstructing tumor at 17 cm proximal to the anus. Biopsied. Tattooed. ?- The distal rectum and anal verge are normal on retroflexion view. ?  ?01/13/2021 Procedure  ? EGD ?- Normal esophagus. ?- Z-line regular, 40 cm from the incisors. ?- Normal stomach. ?- Nodular mucosa in ?  ?01/13/2021 Pathology Results  ? A. DUODENUM, NODULE, BIOPSY:  ?- Peptic duodenitis.  ?- No dysplasia or malignancy.  ? ?B. COLON, SIGMOID, MASS, BIOPSY:  ?- Invasive adenocarcinoma.  ? ?C. COLON, TRANSVERSE, POLYPECTOMY:  ?- Fragment of ulcerated tissue with adenocarcinoma, see comment.  ?- Tubular adenoma (X2 fragments).  ? ?COMMENT:  ? ?B. MMR will be ordered ?  ?01/13/2021 Tumor Marker  ? Patient's tumor was tested for the following markers: CEA. ?Results of the tumor marker test revealed 883. ?  ?01/24/2021 Procedure  ? Successful right-sided port placement, with the tip of the catheter in the proximal right atrium. ?  ?Plan: Catheter ready for use. ?  ?See detailed procedure note with images in PACS. ?The patient  tolerated the procedure well without incident or complication and was returned to Recovery in stable conditi ?  ?01/25/2021 -  Chemotherapy  ? Patient is on Treatment Plan : COLORECTAL FOLFOX q14d  ?   ?01/25/2021 Tumor Marker  ? Patient's tumor was tested for the following markers: CEA. ?Results of the tumor marker test revealed 1378. ?  ? ? ?CANCER STAGING: ? Cancer  Staging  ?Colon cancer metastasized to liver [facility]) ?Staging form: Colon and Rectum, AJCC 8th Edition ?- Clinical stage from 01/13/2021: Stage IVB (cTX, cN0, pM1b) - Signed by Heath Lark, MD on 01/13/2021 ? ? ?INTERVAL HISTORY:  ?[name], a 61 y.o. male, returns for routine follow-up and consideration for next cycle of chemotherapy. Henry Johns was last seen on 09/25/2021. ? ?Due for cycle #11 of FOLFOX today.  ? ?Overall, he tells me he has been feeling pretty well. He reports fatigue. He denies bleeding issues. He underwent paracentesis on 03/22 at which time 4 L fluid were extracted and 03/29 at which time 4 L fluid were extracted. He has lost 11 lb since his last visit. He reports slight diarrhea and denies vomiting. He continues to take potassium.  ? ?Overall, he feels ready for next cycle of chemo today.  ? ? ?REVIEW OF SYSTEMS:  ?Review of Systems  ?Constitutional:  Positive for appetite change and fatigue.  ?HENT:   Negative for nosebleeds.   ?Respiratory:  Positive for cough. Negative for hemoptysis.   ?Gastrointestinal:  Positive for diarrhea. Negative for vomiting.  ?Neurological:  Positive for numbness.  ?Psychiatric/Behavioral:  Positive for sleep disturbance.   ?All other systems reviewed and are negative. ? ?PAST MEDICAL/SURGICAL HISTORY:  ?Past Medical History:  ?Diagnosis Date  ? Colon cancer ([place])   ? Diabetes mellitus without complication ([place])   ? Hypertension   ? ?Past Surgical History:  ?Procedure Laterality Date  ? BIOPSY  01/13/2021  ? Procedure: BIOPSY;  Surgeon: Montez Morita, Quillian Quince, MD;  [facility];  Service: Gastroenterology;;  ? CATARACT EXTRACTION Right   ? COLONOSCOPY WITH PROPOFOL N/A 01/13/2021  ? Procedure: COLONOSCOPY WITH PROPOFOL;  Surgeon: Harvel Quale, MD;  Location: [facility];  Service: Gastroenterology;  Laterality: N/A;  11:05  ? ESOPHAGOGASTRODUODENOSCOPY (EGD) WITH PROPOFOL N/A 01/13/2021  ? Procedure: ESOPHAGOGASTRODUODENOSCOPY (EGD)  WITH PROPOFOL;  Surgeon: Harvel Quale, MD;  Location: [facility];  Service: Gastroenterology;  Laterality: N/A;  ? IR IMAGING GUIDED PORT INSERTION  01/24/2021  ? LIVER BIOPSY  01/10/2021  ? U/S guided  ? POLYPECTOMY  01/13/2021  ? Procedure: POLYPECTOMY;  Surgeon: Harvel Quale, MD;  [facility];  Service: Gastroenterology;;  ? ? ?SOCIAL HISTORY:  ?Social History  ? ?Socioeconomic History  ? Marital status: Married  ?  Spouse name: Not on file  ? Number of children: Not on file  ? Years of education: Not on file  ? Highest education level: Not on file  ?Occupational History  ? Not on file  ?Tobacco Use  ? Smoking status: Never  ? Smokeless tobacco: Never  ?Vaping Use  ? Vaping Use: Never used  ?Substance and Sexual Activity  ? Alcohol use: Not Currently  ? Drug use: Not Currently  ? Sexual activity: Not Currently  ?Other Topics Concern  ? Not on file  ?Social History Narrative  ? Married, no childer  ? ?Social Determinants of Health  ? ?Financial Resource Strain: High Risk  ? Difficulty of Paying Living Expenses: Very hard  ?  Food Insecurity: No Food Insecurity  ? Worried About Charity fundraiser in the Last Year: Never true  ? Ran Out of Food in the Last Year: Never true  ?Transportation Needs: No Transportation Needs  ? Lack of Transportation (Medical): No  ? Lack of Transportation (Non-Medical): No  ?Physical Activity: Inactive  ? Days of Exercise per Week: 0 days  ? Minutes of Exercise per Session: 0 min  ?Stress: Not on file  ?Social Connections: Not on file  ?Intimate Partner Violence: Not At Risk  ? Fear of Current or Ex-Partner: No  ? Emotionally Abused: No  ? Physically Abused: No  ? Sexually Abused: No  ? ? ?FAMILY HISTORY:  ?Family History  ?Problem Relation Age of Onset  ? Cancer Father 37  ?     prostate ca  ? ? ?CURRENT MEDICATIONS:  ?Current Outpatient Medications  ?Medication Sig Dispense Refill  ? acetaminophen (TYLENOL) 500 MG tablet Take 1,000 mg by mouth  every 6 (six) hours as needed for moderate pain.    ? cephALEXin (KEFLEX) 500 MG capsule Take 500 mg by mouth 3 (three) times daily.    ? fluorouracil CALGB 82956 2,400 mg/m2 in sodium chloride 0.9 % 150 mL [place]

## 2021-10-11 ENCOUNTER — Other Ambulatory Visit (HOSPITAL_COMMUNITY): Payer: Self-pay

## 2021-10-11 ENCOUNTER — Inpatient Hospital Stay (HOSPITAL_COMMUNITY): Payer: Medicaid Other

## 2021-10-11 VITALS — BP 91/68 | HR 102 | Resp 16

## 2021-10-11 DIAGNOSIS — Z5112 Encounter for antineoplastic immunotherapy: Secondary | ICD-10-CM | POA: Diagnosis not present

## 2021-10-11 DIAGNOSIS — C787 Secondary malignant neoplasm of liver and intrahepatic bile duct: Secondary | ICD-10-CM

## 2021-10-11 MED ORDER — HEPARIN SOD (PORK) LOCK FLUSH 100 UNIT/ML IV SOLN
500.0000 [IU] | Freq: Once | INTRAVENOUS | Status: AC | PRN
  Administered 2021-10-11: 500 [IU]

## 2021-10-11 MED ORDER — DIPHENOXYLATE-ATROPINE 2.5-0.025 MG PO TABS: 2.0000 | ORAL_TABLET | Freq: Four times a day (QID) | ORAL | 0 refills | Status: DC | PRN

## 2021-10-11 MED ORDER — PEGFILGRASTIM-CBQV 6 MG/0.6ML ~~LOC~~ SOSY
6.0000 mg | PREFILLED_SYRINGE | Freq: Once | SUBCUTANEOUS | Status: AC
  Administered 2021-10-11: 6 mg via SUBCUTANEOUS
  Filled 2021-10-11: qty 0.6

## 2021-10-11 MED ORDER — SODIUM CHLORIDE 0.9% FLUSH
10.0000 mL | INTRAVENOUS | Status: DC | PRN
  Administered 2021-10-11: 10 mL

## 2021-10-11 NOTE — Patient Instructions (Signed)
[facility]  Discharge Instructions: ?Thank you for choosing [facility] to provide your oncology and hematology care.  ?If you have a lab appointment with the [facility], please come in thru the Main Entrance and check in at the main information desk. ? ?Wear comfortable clothing and clothing appropriate for easy access to any Portacath or PICC line.  ? ?We strive to give you quality time with your provider. You may need to reschedule your appointment if you arrive late (15 or more minutes).  Arriving late affects you and other patients whose appointments are after yours.  Also, if you miss three or more appointments without notifying the office, you may be dismissed from the clinic at the provider?s discretion.    ?  ?For prescription refill requests, have your pharmacy contact our office and allow 72 hours for refills to be completed.   ? ?Today you received the following chemotherapy and/or immunotherapy agents Pump STOP and PORT deaccess    ?  ?To help prevent nausea and vomiting after your treatment, we encourage you to take your nausea medication as directed. ? ?BELOW ARE SYMPTOMS THAT SHOULD BE REPORTED IMMEDIATELY: ?*FEVER GREATER THAN 100.4 F (38 ?C) OR HIGHER ?*CHILLS OR SWEATING ?*NAUSEA AND VOMITING THAT IS NOT CONTROLLED WITH YOUR NAUSEA MEDICATION ?*UNUSUAL SHORTNESS OF BREATH ?*UNUSUAL BRUISING OR BLEEDING ?*URINARY PROBLEMS (pain or burning when urinating, or frequent urination) ?*BOWEL PROBLEMS (unusual diarrhea, constipation, pain near the anus) ?TENDERNESS IN MOUTH AND THROAT WITH OR WITHOUT PRESENCE OF ULCERS (sore throat, sores in mouth, or a toothache) ?UNUSUAL RASH, SWELLING OR PAIN  ?UNUSUAL VAGINAL DISCHARGE OR ITCHING  ? ?Items with * indicate a potential emergency and should be followed up as soon as possible or go to the Emergency Department if any problems should occur. ? ?Please show the CHEMOTHERAPY ALERT CARD or IMMUNOTHERAPY ALERT CARD at check-in to  the Emergency Department and triage nurse. ? ?Should you have questions after your visit or need to cancel or reschedule your appointment, please contact [facility] [phone]  and follow the prompts.  Office hours are 8:00 a.m. to 4:30 p.m. Monday - Friday. Please note that voicemails left after 4:00 p.m. may not be returned until the following business day.  We are closed weekends and major holidays. You have access to a nurse at all times for urgent questions. Please call the main number to the clinic [phone] and follow the prompts. ? ?For any non-urgent questions, you may also contact your provider using MyChart. We now offer e-Visits for anyone 42 and older to request care online for non-urgent symptoms. For details visit [email or web address]. ?  ?Also download the MyChart app! Go to the app store, search "MyChart", open the app, select [facility], and log in with your MyChart username and password. ? ?Due to Covid, a mask is required upon entering the hospital/clinic. If you do not have a mask, one will be given to you upon arrival. For doctor visits, patients may have 1 support person aged 68 or older with them. For treatment visits, patients cannot have anyone with them due to current Covid guidelines and our immunocompromised population.  ?

## 2021-10-11 NOTE — Progress Notes (Signed)
Patient presents today for 5FU pump stop and disconnection after 46 hour continous infusion.   5FU pump deaccessed.  Patients port flushed without difficulty.  Good blood return noted with no bruising or swelling noted at site.  needle removed intact.  Band aid applied.  Udenyca administration without incident; injection site WNL; see MAR for injection details.  Patient tolerated procedure well and without incident.  No questions or complaints noted at this time. VSS with discharge and left in satisfactory condition via wheelchair with no s/s of distress noted.    ?

## 2021-10-12 ENCOUNTER — Telehealth (HOSPITAL_COMMUNITY): Payer: Self-pay

## 2021-10-12 NOTE — Telephone Encounter (Signed)
Patients family left message concerning diarrhea medication.  Chart checked and script was sent 10/11/2021 to [facility].  Message left for family letting them know the medication was sent yesterday after 1600 and to call back if the pharmacy still has not received the script.   ?

## 2021-10-15 ENCOUNTER — Encounter (HOSPITAL_COMMUNITY): Payer: Self-pay

## 2021-10-15 ENCOUNTER — Other Ambulatory Visit: Payer: Self-pay

## 2021-10-15 ENCOUNTER — Inpatient Hospital Stay (HOSPITAL_COMMUNITY)
Admission: EM | Admit: 2021-10-15 | Discharge: 2021-10-19 | DRG: 372 | Disposition: A | Discharge status: Home Health | Payer: Medicaid Other | Attending: Family Medicine | Admitting: Family Medicine

## 2021-10-15 ENCOUNTER — Emergency Department (HOSPITAL_COMMUNITY): Payer: Medicaid Other

## 2021-10-15 DIAGNOSIS — X58XXXA Exposure to other specified factors, initial encounter: Secondary | ICD-10-CM | POA: Diagnosis present

## 2021-10-15 DIAGNOSIS — R197 Diarrhea, unspecified: Secondary | ICD-10-CM | POA: Diagnosis not present

## 2021-10-15 DIAGNOSIS — D6959 Other secondary thrombocytopenia: Secondary | ICD-10-CM | POA: Diagnosis present

## 2021-10-15 DIAGNOSIS — E860 Dehydration: Secondary | ICD-10-CM | POA: Diagnosis present

## 2021-10-15 DIAGNOSIS — C7802 Secondary malignant neoplasm of left lung: Secondary | ICD-10-CM | POA: Diagnosis present

## 2021-10-15 DIAGNOSIS — E1140 Type 2 diabetes mellitus with diabetic neuropathy, unspecified: Secondary | ICD-10-CM | POA: Diagnosis present

## 2021-10-15 DIAGNOSIS — D649 Anemia, unspecified: Secondary | ICD-10-CM

## 2021-10-15 DIAGNOSIS — E2740 Unspecified adrenocortical insufficiency: Secondary | ICD-10-CM | POA: Diagnosis present

## 2021-10-15 DIAGNOSIS — R772 Abnormality of alphafetoprotein: Secondary | ICD-10-CM

## 2021-10-15 DIAGNOSIS — C189 Malignant neoplasm of colon, unspecified: Secondary | ICD-10-CM | POA: Diagnosis not present

## 2021-10-15 DIAGNOSIS — I10 Essential (primary) hypertension: Secondary | ICD-10-CM | POA: Diagnosis present

## 2021-10-15 DIAGNOSIS — Z794 Long term (current) use of insulin: Secondary | ICD-10-CM

## 2021-10-15 DIAGNOSIS — E876 Hypokalemia: Secondary | ICD-10-CM | POA: Diagnosis present

## 2021-10-15 DIAGNOSIS — Z9841 Cataract extraction status, right eye: Secondary | ICD-10-CM

## 2021-10-15 DIAGNOSIS — E878 Other disorders of electrolyte and fluid balance, not elsewhere classified: Secondary | ICD-10-CM | POA: Diagnosis present

## 2021-10-15 DIAGNOSIS — R531 Weakness: Secondary | ICD-10-CM | POA: Diagnosis not present

## 2021-10-15 DIAGNOSIS — K429 Umbilical hernia without obstruction or gangrene: Secondary | ICD-10-CM | POA: Diagnosis present

## 2021-10-15 DIAGNOSIS — R748 Abnormal levels of other serum enzymes: Secondary | ICD-10-CM | POA: Diagnosis present

## 2021-10-15 DIAGNOSIS — C7801 Secondary malignant neoplasm of right lung: Secondary | ICD-10-CM | POA: Diagnosis present

## 2021-10-15 DIAGNOSIS — I959 Hypotension, unspecified: Secondary | ICD-10-CM | POA: Diagnosis present

## 2021-10-15 DIAGNOSIS — E8809 Other disorders of plasma-protein metabolism, not elsewhere classified: Secondary | ICD-10-CM | POA: Diagnosis present

## 2021-10-15 DIAGNOSIS — R180 Malignant ascites: Secondary | ICD-10-CM | POA: Diagnosis present

## 2021-10-15 DIAGNOSIS — E1165 Type 2 diabetes mellitus with hyperglycemia: Secondary | ICD-10-CM | POA: Diagnosis present

## 2021-10-15 DIAGNOSIS — A0472 Enterocolitis due to Clostridium difficile, not specified as recurrent: Principal | ICD-10-CM | POA: Diagnosis present

## 2021-10-15 DIAGNOSIS — E785 Hyperlipidemia, unspecified: Secondary | ICD-10-CM | POA: Diagnosis present

## 2021-10-15 DIAGNOSIS — R188 Other ascites: Secondary | ICD-10-CM

## 2021-10-15 DIAGNOSIS — Z6824 Body mass index (BMI) 24.0-24.9, adult: Secondary | ICD-10-CM

## 2021-10-15 DIAGNOSIS — A09 Infectious gastroenteritis and colitis, unspecified: Principal | ICD-10-CM

## 2021-10-15 DIAGNOSIS — E440 Moderate protein-calorie malnutrition: Secondary | ICD-10-CM | POA: Diagnosis present

## 2021-10-15 DIAGNOSIS — Z8042 Family history of malignant neoplasm of prostate: Secondary | ICD-10-CM

## 2021-10-15 DIAGNOSIS — E43 Unspecified severe protein-calorie malnutrition: Secondary | ICD-10-CM | POA: Insufficient documentation

## 2021-10-15 DIAGNOSIS — C787 Secondary malignant neoplasm of liver and intrahepatic bile duct: Secondary | ICD-10-CM | POA: Diagnosis present

## 2021-10-15 DIAGNOSIS — E871 Hypo-osmolality and hyponatremia: Secondary | ICD-10-CM | POA: Diagnosis present

## 2021-10-15 DIAGNOSIS — Z85038 Personal history of other malignant neoplasm of large intestine: Secondary | ICD-10-CM

## 2021-10-15 DIAGNOSIS — T451X5A Adverse effect of antineoplastic and immunosuppressive drugs, initial encounter: Secondary | ICD-10-CM | POA: Diagnosis present

## 2021-10-15 DIAGNOSIS — D630 Anemia in neoplastic disease: Secondary | ICD-10-CM | POA: Diagnosis present

## 2021-10-15 DIAGNOSIS — Z79899 Other long term (current) drug therapy: Secondary | ICD-10-CM

## 2021-10-15 DIAGNOSIS — E1136 Type 2 diabetes mellitus with diabetic cataract: Secondary | ICD-10-CM | POA: Diagnosis present

## 2021-10-15 LAB — COMPREHENSIVE METABOLIC PANEL
ALT: 19 U/L (ref 0–44)
AST: 33 U/L (ref 15–41)
Albumin: 2.8 g/dL — ABNORMAL LOW (ref 3.5–5.0)
Alkaline Phosphatase: 325 U/L — ABNORMAL HIGH (ref 38–126)
Anion gap: 10 (ref 5–15)
BUN: 29 mg/dL — ABNORMAL HIGH (ref 6–20)
CO2: 21 mmol/L — ABNORMAL LOW (ref 22–32)
Calcium: 8.2 mg/dL — ABNORMAL LOW (ref 8.9–10.3)
Chloride: 99 mmol/L (ref 98–111)
Creatinine, Ser: 0.83 mg/dL (ref 0.61–1.24)
GFR, Estimated: >60 mL/min (ref >60)
Glucose, Bld: 168 mg/dL — ABNORMAL HIGH (ref 70–99)
Potassium: 3 mmol/L — ABNORMAL LOW (ref 3.5–5.1)
Sodium: 130 mmol/L — ABNORMAL LOW (ref 135–145)
Total Bilirubin: 1.4 mg/dL — ABNORMAL HIGH (ref 0.3–1.2)
Total Protein: 5.1 g/dL — ABNORMAL LOW (ref 6.5–8.1)

## 2021-10-15 LAB — CBC WITH DIFFERENTIAL/PLATELET
Abs Immature Granulocytes: 0.96 10*3/uL — ABNORMAL HIGH (ref 0.00–0.07)
Basophils Absolute: 0.1 10*3/uL (ref 0.0–0.1)
Basophils Relative: 0 %
Eosinophils Absolute: 0.1 10*3/uL (ref 0.0–0.5)
Eosinophils Relative: 0 %
HCT: 29.8 % — ABNORMAL LOW (ref 39.0–52.0)
Hemoglobin: 10.3 g/dL — ABNORMAL LOW (ref 13.0–17.0)
Immature Granulocytes: 4 %
Lymphocytes Relative: 10 %
Lymphs Abs: 2.4 10*3/uL (ref 0.7–4.0)
MCH: 30.7 pg (ref 26.0–34.0)
MCHC: 34.6 g/dL (ref 30.0–36.0)
MCV: 89 fL (ref 80.0–100.0)
Monocytes Absolute: 0.4 10*3/uL (ref 0.1–1.0)
Monocytes Relative: 1 %
Neutro Abs: 21 10*3/uL — ABNORMAL HIGH (ref 1.7–7.7)
Neutrophils Relative %: 85 %
Platelets: 98 10*3/uL — ABNORMAL LOW (ref 150–400)
RBC: 3.35 MIL/uL — ABNORMAL LOW (ref 4.22–5.81)
RDW: 15.9 % — ABNORMAL HIGH (ref 11.5–15.5)
WBC: 24.9 10*3/uL — ABNORMAL HIGH (ref 4.0–10.5)
nRBC: 0 % (ref 0.0–0.2)

## 2021-10-15 MED ORDER — METRONIDAZOLE 500 MG/100ML IV SOLN
500.0000 mg | Freq: Once | INTRAVENOUS | Status: AC
  Administered 2021-10-15: 500 mg via INTRAVENOUS
  Filled 2021-10-15: qty 100

## 2021-10-15 MED ORDER — SODIUM CHLORIDE 0.9 % IV BOLUS
2000.0000 mL | Freq: Once | INTRAVENOUS | Status: AC
Start: 2021-10-15 — End: 2021-10-15
  Administered 2021-10-15: 1000 mL via INTRAVENOUS

## 2021-10-15 MED ORDER — POTASSIUM CHLORIDE 10 MEQ/100ML IV SOLN
10.0000 meq | Freq: Once | INTRAVENOUS | Status: AC
Start: 2021-10-15 — End: 2021-10-15
  Administered 2021-10-15: 10 meq via INTRAVENOUS
  Filled 2021-10-15: qty 100

## 2021-10-15 MED ORDER — IOHEXOL 300 MG/ML  SOLN
100.0000 mL | Freq: Once | INTRAMUSCULAR | Status: AC | PRN
  Administered 2021-10-15: 100 mL via INTRAVENOUS

## 2021-10-15 MED ORDER — CIPROFLOXACIN IN D5W 400 MG/200ML IV SOLN
400.0000 mg | Freq: Once | INTRAVENOUS | Status: AC
  Administered 2021-10-15: 400 mg via INTRAVENOUS
  Filled 2021-10-15: qty 200

## 2021-10-15 MED ORDER — POTASSIUM CHLORIDE CRYS ER 20 MEQ PO TBCR
40.0000 meq | EXTENDED_RELEASE_TABLET | Freq: Once | ORAL | Status: AC
  Administered 2021-10-16: 40 meq via ORAL
  Filled 2021-10-15: qty 2

## 2021-10-15 MED ORDER — ACETAMINOPHEN 325 MG PO TABS: 650.0000 mg | ORAL_TABLET | Freq: Four times a day (QID) | ORAL | Status: DC | PRN

## 2021-10-15 MED ORDER — ACETAMINOPHEN 650 MG RE SUPP: 650.0000 mg | Freq: Four times a day (QID) | RECTAL | Status: DC | PRN

## 2021-10-15 NOTE — H&P (Addendum)
?History and Physical  ? ? ?Patient: Henry Johns DOB: 1960/09/19 ?DOA: 10/15/2021 ?DOS: the patient was seen and examined on 10/16/2021 ?PCP: [facility]  ?Patient coming from: Home ? ?Chief Complaint:  ?Chief Complaint  ?Patient presents with  ? Diarrhea  ? ?HPI: Henry Johns is a 61 y.o. 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  Patient follows with Dr. Delton Coombes on 4/3 when he had most recent chemotherapy (Monday through Wednesday).  He complained of diarrhea which started on Monday and continued with last bowel movement being this afternoon around 4 PM.  Patient complained of generalized weakness and fatigue, but denies fever, chills, chest pain, shortness of breath, headache. ?Patient was admitted from 3/3 to 3/5 due to hypotension and was discharged with midodrine and hydrocortisone.  Spironolactone was held, but patient continued on Lasix as needed. ? ? ?ED Course:  ?In the emergency department, he was noted to be hypotensive with BP of 88/66, other vital signs were within normal range.  Work-up in the ED showed leukocytosis, normocytic anemia, thrombocytopenia, BMP showed hyponatremia, hypokalemia, hyperglycemia, ALP 325. ?CT abdomen and pelvis with contrast showed hepatic metastatic disease which has increased in the interval from prior exam. Diffuse wall thickening in the sigmoid colon is noted although no discrete mass lesion is noted.  Large volume ascites with evidence of a small umbilical hernia.  Mild increase in pulmonary nodules consistent with progressive metastatic disease ?Patient was empirically treated with IV ciprofloxacin, Flagyl.  IV hydration was provided, potassium was replenished. ?Dr. Delton Coombes (hematology/oncology) was consulted and recommended admitting  patient to medicine.  CT and GI stool panel pending. ? ?Review of Systems: ?Review of systems as noted in the HPI. All other systems reviewed and are negative. ? ? ?Past Medical History:  ?Diagnosis Date  ? Colon cancer ([place])   ? Diabetes mellitus without complication ([facility])   ? Hypertension   ? ?Past Surgical History:  ?Procedure Laterality Date  ? BIOPSY  01/13/2021  ? Procedure: BIOPSY;  Surgeon: Montez Morita, Quillian Quince, MD;  [facility];  Service: Gastroenterology;;  ? CATARACT EXTRACTION Right   ? COLONOSCOPY WITH PROPOFOL N/A 01/13/2021  ? Procedure: COLONOSCOPY WITH PROPOFOL;  Surgeon: Harvel Quale, MD;  [facility];  Service: Gastroenterology;  Laterality: N/A;  11:05  ? ESOPHAGOGASTRODUODENOSCOPY (EGD) WITH PROPOFOL N/A 01/13/2021  ? Procedure: ESOPHAGOGASTRODUODENOSCOPY (EGD) WITH PROPOFOL;  Surgeon: Harvel Quale, MD;  [facility];  Service: Gastroenterology;  Laterality: N/A;  ? IR IMAGING GUIDED PORT INSERTION  01/24/2021  ? LIVER BIOPSY  01/10/2021  ? U/S guided  ? POLYPECTOMY  01/13/2021  ? Procedure: POLYPECTOMY;  Surgeon: Harvel Quale, MD;  [facility];  Service: Gastroenterology;;  ? ? ?Social History:  reports that he has never smoked. He has never used smokeless tobacco. He reports that he does not currently use alcohol. He reports that he does not currently use drugs. ? ? ?No Known Allergies ? ?Family History  ?Problem Relation Age of Onset  ? Cancer Father 78  ?     prostate ca  ?  ? ?Prior to Admission medications   ?Medication Sig Start Date End Date Taking? Authorizing Provider  ?acetaminophen (TYLENOL) 500 MG tablet Take 1,000 mg by mouth every 6 (six) hours as needed for  moderate pain.    [provider]  ?cephALEXin (KEFLEX) 500 MG capsule Take 500 mg by mouth 3 (three) times daily. 07/11/21   [provider]  ?diphenoxylate-atropine (LOMOTIL) 2.5-0.025 MG tablet Take 2 tablets by mouth 4 (four)  times daily as needed for diarrhea or loose stools. Do not take with Imodium. 10/11/21   Derek Jack, MD  ?fluorouracil CALGB 26834 2,400 mg/m2 in sodium chloride 0.9 % 150 mL Inject 2,400 mg/m2 into the vein over 48 hr.    [provider]  ?furosemide (LASIX) 40 MG tablet Take 1 tablet (40 mg total) by mouth daily as needed. 06/28/21   Derek Jack, MD  ?hydrocortisone (CORTEF) 10 MG tablet 20 mg (2 tablets) in the morning and 10 mg (1 tablet) in the evening. 09/10/21   Barton Dubois, MD  ?insulin NPH-regular Human (70-30) 100 UNIT/ML injection Inject 10-12 Units into the skin 2 (two) times daily.    [provider]  ?LEUCOVORIN CALCIUM IV Inject 400 mg/m2 into the vein every 14 (fourteen) days. 01/25/21   [provider]  ?lidocaine (XYLOCAINE) 5 % ointment Apply 1 application topically in the morning and at bedtime. Apply to both hands twice a day 04/20/21   Derek Jack, MD  ?Lidocaine, Anorectal, 5 % CREA Apply 1 application topically 2 (two) times daily as needed. 05/02/21   Derek Jack, MD  ?lidocaine-prilocaine (EMLA) cream Apply to affected area once 01/18/21   Heath Lark, MD  ?LORazepam (ATIVAN) 0.5 MG tablet Take 1 tablet by mouth twice daily as needed for anxiety 03/20/21   Derek Jack, MD  ?magnesium oxide (MAG-OX) 400 (240 Mg) MG tablet Take 1 tablet (400 mg total) by mouth 2 (two) times daily. 06/05/21   Manuella Ghazi, Pratik D, DO  ?midodrine (PROAMATINE) 10 MG tablet Take 1 tablet (10 mg total) by mouth 3 (three) times daily with meals. 10/09/21 11/08/21  Derek Jack, MD  ?morphine (MSIR) 15 MG tablet Take 1 tablet (15 mg total) by mouth 2 (two) times daily as needed for severe pain. 08/30/21   Derek Jack, MD  ?ondansetron (ZOFRAN) 8 MG tablet Take 1 tablet (8 mg total) by mouth every 8 (eight) hours as needed for nausea. 02/27/21   Derek Jack, MD  ?pentoxifylline (TRENTAL) 400 MG CR tablet Take by mouth. 04/05/21    [provider]  ?potassium chloride (KLOR-CON M) 10 MEQ tablet Take 2 tablets (20 mEq total) by mouth daily. 10/09/21   Derek Jack, MD  ?prochlorperazine (COMPAZINE) 10 MG tablet Take 1 tablet by mouth every 6 (six) hours as needed. 01/04/21   [provider]  ?silver sulfADIAZINE (SILVADENE) 1 % cream Apply 1 application topically daily. 05/22/21   Derek Jack, MD  ?simvastatin (ZOCOR) 20 MG tablet Take 20 mg by mouth every evening. 01/23/21   [provider]  ?tamsulosin (FLOMAX) 0.4 MG CAPS capsule Take 1 capsule (0.4 mg total) by mouth daily after supper. 07/19/21   Derek Jack, MD  ?temazepam (RESTORIL) 30 MG capsule TAKE 1 CAPSULE BY MOUTH AT BEDTIME AS NEEDED FOR SLEEP 09/25/21   Derek Jack, MD  ?traZODone (DESYREL) 100 MG tablet Take 1 tablet (100 mg total) by mouth at bedtime. 07/19/21   Derek Jack, MD  ? ? ?Physical Exam: ?BP (!) 83/56   Pulse 91   Temp (!) 97.5 ?F (36.4 ?C) (Oral)   Resp 16   Ht '5\' 8"'$  (1.727 m)   Wt 64.9 kg   SpO2 98%   BMI 21.74 kg/m?  ? ?  General: 61 y.o. year-old male well developed well nourished in no acute distress.  Alert and oriented x3. ?HEENT: NCAT, EOMI, dry mucous membrane ?Neck: Supple, trachea medial ?Cardiovascular: Regular rate and rhythm with no rubs or gallops.  No thyromegaly or JVD noted.  No lower extremity edema. 2/4 pulses in all 4 extremities. ?Respiratory: Clear to auscultation with no wheezes or rales. Good inspiratory effort. ?Abdomen: Soft, nontender nondistended with normal bowel sounds x4 quadrants. ?Muskuloskeletal: No cyanosis, clubbing or edema noted bilaterally ?Neuro: CN II-XII intact, strength 5/5 x 4, sensation, reflexes intact ?Skin: No ulcerative lesions noted or rashes ?Psychiatry: Mood is appropriate for condition and setting ?   ?   ?   ?Labs on Admission:  ?Basic Metabolic Panel: ?Recent Labs  ?Lab 10/09/21 ?2263 10/15/21 ?1740  ?NA 132* 130*  ?K 3.0* 3.0*  ?CL 101 99   ?CO2 22 21*  ?GLUCOSE 126* 168*  ?BUN 20 29*  ?CREATININE 0.74 0.83  ?CALCIUM 8.1* 8.2*  ?MG 1.9  --   ? ?Liver Function Tests: ?Recent Labs  ?Lab 10/09/21 ?3354 10/15/21 ?1740  ?AST 29 33  ?ALT 20 19  ?ALKPHOS 315*

## 2021-10-15 NOTE — ED Provider Notes (Signed)
?[facility] ?Provider Note ? ? ?CSN: [id number] ?Arrival date & time: 10/15/21  1640 ? ?  ? ?History ? ?Chief Complaint  ?Patient presents with  ? Diarrhea  ? ? ?Henry Johns is a 61 y.o. male. ? ?Patient with metastatic colon cancer.  He was getting chemotherapy Monday through Wednesday of this week.  He started with severe diarrhea on Tuesday and this is progressed for 5 days. ? ?The history is provided by the patient and medical records. No language interpreter was used.  ?Diarrhea ?Quality:  Copious ?Severity:  Moderate ?Onset quality:  Sudden ?Timing:  Constant ?Progression:  Worsening ?Relieved by:  Nothing ?Worsened by:  Nothing ?Ineffective treatments:  None tried ?Associated symptoms: abdominal pain   ?Associated symptoms: no headaches   ?Risk factors: no recent antibiotic use   ? ?  ? ?Home Medications ?Prior to Admission medications   ?Medication Sig Start Date End Date Taking? Authorizing Provider  ?acetaminophen (TYLENOL) 500 MG tablet Take 1,000 mg by mouth every 6 (six) hours as needed for moderate pain.    [provider]  ?cephALEXin (KEFLEX) 500 MG capsule Take 500 mg by mouth 3 (three) times daily. 07/11/21   [provider]  ?diphenoxylate-atropine (LOMOTIL) 2.5-0.025 MG tablet Take 2 tablets by mouth 4 (four) times daily as needed for diarrhea or loose stools. Do not take with Imodium. 10/11/21   Derek Jack, MD  ?fluorouracil CALGB 74081 2,400 mg/m2 in sodium chloride 0.9 % 150 mL Inject 2,400 mg/m2 into the vein over 48 hr.    [provider]  ?furosemide (LASIX) 40 MG tablet Take 1 tablet (40 mg total) by mouth daily as needed. 06/28/21   Derek Jack, MD  ?hydrocortisone (CORTEF) 10 MG tablet 20 mg (2 tablets) in the morning and 10 mg (1 tablet) in the evening. 09/10/21   Barton Dubois, MD  ?insulin NPH-regular Human (70-30) 100 UNIT/ML injection Inject 10-12 Units into the skin 2 (two) times daily.    [provider]   ?LEUCOVORIN CALCIUM IV Inject 400 mg/m2 into the vein every 14 (fourteen) days. 01/25/21   [provider]  ?lidocaine (XYLOCAINE) 5 % ointment Apply 1 application topically in the morning and at bedtime. Apply to both hands twice a day 04/20/21   Derek Jack, MD  ?Lidocaine, Anorectal, 5 % CREA Apply 1 application topically 2 (two) times daily as needed. 05/02/21   Derek Jack, MD  ?lidocaine-prilocaine (EMLA) cream Apply to affected area once 01/18/21   Heath Lark, MD  ?LORazepam (ATIVAN) 0.5 MG tablet Take 1 tablet by mouth twice daily as needed for anxiety 03/20/21   Derek Jack, MD  ?magnesium oxide (MAG-OX) 400 (240 Mg) MG tablet Take 1 tablet (400 mg total) by mouth 2 (two) times daily. 06/05/21   Manuella Ghazi, Pratik D, DO  ?midodrine (PROAMATINE) 10 MG tablet Take 1 tablet (10 mg total) by mouth 3 (three) times daily with meals. 10/09/21 11/08/21  Derek Jack, MD  ?morphine (MSIR) 15 MG tablet Take 1 tablet (15 mg total) by mouth 2 (two) times daily as needed for severe pain. 08/30/21   Derek Jack, MD  ?ondansetron (ZOFRAN) 8 MG tablet Take 1 tablet (8 mg total) by mouth every 8 (eight) hours as needed for nausea. 02/27/21   Derek Jack, MD  ?pentoxifylline (TRENTAL) 400 MG CR tablet Take by mouth. 04/05/21   [provider]  ?potassium chloride (KLOR-CON M) 10 MEQ tablet Take 2 tablets (20 mEq total) by mouth daily. 10/09/21  Derek Jack, MD  ?prochlorperazine (COMPAZINE) 10 MG tablet Take 1 tablet by mouth every 6 (six) hours as needed. 01/04/21   [provider]  ?silver sulfADIAZINE (SILVADENE) 1 % cream Apply 1 application topically daily. 05/22/21   Derek Jack, MD  ?simvastatin (ZOCOR) 20 MG tablet Take 20 mg by mouth every evening. 01/23/21   [provider]  ?tamsulosin (FLOMAX) 0.4 MG CAPS capsule Take 1 capsule (0.4 mg total) by mouth daily after supper. 07/19/21   Derek Jack, MD  ?temazepam  (RESTORIL) 30 MG capsule TAKE 1 CAPSULE BY MOUTH AT BEDTIME AS NEEDED FOR SLEEP 09/25/21   Derek Jack, MD  ?traZODone (DESYREL) 100 MG tablet Take 1 tablet (100 mg total) by mouth at bedtime. 07/19/21   Derek Jack, MD  ?   ? ?Allergies    ?Patient has no known allergies.   ? ?Review of Systems   ?Review of Systems  ?Constitutional:  Negative for appetite change and fatigue.  ?HENT:  Negative for congestion, ear discharge and sinus pressure.   ?Eyes:  Negative for discharge.  ?Respiratory:  Negative for cough.   ?Cardiovascular:  Negative for chest pain.  ?Gastrointestinal:  Positive for abdominal pain and diarrhea.  ?Genitourinary:  Negative for frequency and hematuria.  ?Musculoskeletal:  Negative for back pain.  ?Skin:  Negative for rash.  ?Neurological:  Negative for seizures and headaches.  ?Psychiatric/Behavioral:  Negative for hallucinations.   ? ?Physical Exam ?Updated Vital Signs ?BP 107/80   Pulse (!) 107   Temp (!) 97.5 ?F (36.4 ?C) (Oral)   Resp 17   Ht '5\' 8"'$  (1.727 m)   Wt 64.9 kg   SpO2 100%   BMI 21.74 kg/m?  ?Physical Exam ?Vitals and nursing note reviewed.  ?Constitutional:   ?   Appearance: He is well-developed.  ?HENT:  ?   Head: Normocephalic.  ?   Nose: Nose normal.  ?Eyes:  ?   General: No scleral icterus. ?   Conjunctiva/sclera: Conjunctivae normal.  ?Neck:  ?   Thyroid: No thyromegaly.  ?Cardiovascular:  ?   Rate and Rhythm: Normal rate and regular rhythm.  ?   Heart sounds: No murmur heard. ?  No friction rub. No gallop.  ?Pulmonary:  ?   Breath sounds: No stridor. No wheezing or rales.  ?Chest:  ?   Chest wall: No tenderness.  ?Abdominal:  ?   General: There is no distension.  ?   Tenderness: There is abdominal tenderness. There is no rebound.  ?Musculoskeletal:     ?   General: Normal range of motion.  ?   Cervical back: Neck supple.  ?Lymphadenopathy:  ?   Cervical: No cervical adenopathy.  ?Skin: ?   Findings: No erythema or rash.  ?Neurological:  ?   Mental  Status: He is alert and oriented to person, place, and time.  ?   Motor: No abnormal muscle tone.  ?   Coordination: Coordination normal.  ?Psychiatric:     ?   Behavior: Behavior normal.  ? ? ?ED Results / Procedures / Treatments   ?Labs ?(all labs ordered are listed, but only abnormal results are displayed) ?Labs Reviewed  ?CBC WITH DIFFERENTIAL/PLATELET - Abnormal; Notable for the following components:  ?    Result Value  ? WBC 24.9 (*)   ? RBC 3.35 (*)   ? Hemoglobin 10.3 (*)   ? HCT 29.8 (*)   ? RDW 15.9 (*)   ? Platelets 98 (*)   ? Neutro  Abs 21.0 (*)   ? Abs Immature Granulocytes 0.96 (*)   ? All other components within normal limits  ?COMPREHENSIVE METABOLIC PANEL - Abnormal; Notable for the following components:  ? Sodium 130 (*)   ? Potassium 3.0 (*)   ? CO2 21 (*)   ? Glucose, Bld 168 (*)   ? BUN 29 (*)   ? Calcium 8.2 (*)   ? Total Protein 5.1 (*)   ? Albumin 2.8 (*)   ? Alkaline Phosphatase 325 (*)   ? Total Bilirubin 1.4 (*)   ? All other components within normal limits  ?C DIFFICILE QUICK SCREEN W PCR REFLEX    ?GASTROINTESTINAL PANEL BY PCR, STOOL (REPLACES STOOL CULTURE)  ?CULTURE, BLOOD (ROUTINE X 2)  ?CULTURE, BLOOD (ROUTINE X 2)  ? ? ?EKG ?None ? ?Radiology ?CT ABDOMEN PELVIS W CONTRAST ? ?Result Date: 10/15/2021 ?CLINICAL DATA:  Clinical history of metastatic colon carcinoma with acute abdominal pain, initial encounter EXAM: CT ABDOMEN AND PELVIS WITH CONTRAST TECHNIQUE: Multidetector CT imaging of the abdomen and pelvis was performed using the standard protocol following bolus administration of intravenous contrast. RADIATION DOSE REDUCTION: This exam was performed according to the departmental dose-optimization program which includes automated exposure control, adjustment of the mA and/or kV according to patient size and/or use of iterative reconstruction technique. CONTRAST:  158m OMNIPAQUE IOHEXOL 300 MG/ML  SOLN COMPARISON:  09/04/2021 FINDINGS: Lower chest: Scattered small nodules are  identified within the lung bases bilaterally slightly greater on the right than the left consistent with metastatic disease. These have increased slightly in number when compared with the prior exam. Hepatobiliary: Liver is sh

## 2021-10-15 NOTE — ED Triage Notes (Signed)
Patient with complaints of diarrhea for a week. Was given a prescription for diarrhea last week in the cancer center.  ?

## 2021-10-16 DIAGNOSIS — Z794 Long term (current) use of insulin: Secondary | ICD-10-CM

## 2021-10-16 DIAGNOSIS — E1140 Type 2 diabetes mellitus with diabetic neuropathy, unspecified: Secondary | ICD-10-CM

## 2021-10-16 DIAGNOSIS — R180 Malignant ascites: Secondary | ICD-10-CM | POA: Diagnosis not present

## 2021-10-16 DIAGNOSIS — C189 Malignant neoplasm of colon, unspecified: Secondary | ICD-10-CM | POA: Diagnosis not present

## 2021-10-16 DIAGNOSIS — R197 Diarrhea, unspecified: Secondary | ICD-10-CM | POA: Diagnosis not present

## 2021-10-16 DIAGNOSIS — R531 Weakness: Secondary | ICD-10-CM

## 2021-10-16 DIAGNOSIS — E8809 Other disorders of plasma-protein metabolism, not elsewhere classified: Secondary | ICD-10-CM

## 2021-10-16 DIAGNOSIS — R772 Abnormality of alphafetoprotein: Secondary | ICD-10-CM

## 2021-10-16 DIAGNOSIS — D649 Anemia, unspecified: Secondary | ICD-10-CM

## 2021-10-16 DIAGNOSIS — R188 Other ascites: Secondary | ICD-10-CM

## 2021-10-16 LAB — MRSA NEXT GEN BY PCR, NASAL: MRSA by PCR Next Gen: NOT DETECTED

## 2021-10-16 LAB — COMPREHENSIVE METABOLIC PANEL
ALT: 16 U/L (ref 0–44)
AST: 29 U/L (ref 15–41)
Albumin: 2.4 g/dL — ABNORMAL LOW (ref 3.5–5.0)
Alkaline Phosphatase: 270 U/L — ABNORMAL HIGH (ref 38–126)
Anion gap: 5 (ref 5–15)
BUN: 21 mg/dL — ABNORMAL HIGH (ref 6–20)
CO2: 22 mmol/L (ref 22–32)
Calcium: 7.7 mg/dL — ABNORMAL LOW (ref 8.9–10.3)
Chloride: 107 mmol/L (ref 98–111)
Creatinine, Ser: 0.63 mg/dL (ref 0.61–1.24)
GFR, Estimated: >60 mL/min (ref >60)
Glucose, Bld: 107 mg/dL — ABNORMAL HIGH (ref 70–99)
Potassium: 3.3 mmol/L — ABNORMAL LOW (ref 3.5–5.1)
Sodium: 134 mmol/L — ABNORMAL LOW (ref 135–145)
Total Bilirubin: 1.1 mg/dL (ref 0.3–1.2)
Total Protein: 4.3 g/dL — ABNORMAL LOW (ref 6.5–8.1)

## 2021-10-16 LAB — CBC
HCT: 25.9 % — ABNORMAL LOW (ref 39.0–52.0)
Hemoglobin: 9 g/dL — ABNORMAL LOW (ref 13.0–17.0)
MCH: 30.8 pg (ref 26.0–34.0)
MCHC: 34.7 g/dL (ref 30.0–36.0)
MCV: 88.7 fL (ref 80.0–100.0)
Platelets: 86 10*3/uL — ABNORMAL LOW (ref 150–400)
RBC: 2.92 MIL/uL — ABNORMAL LOW (ref 4.22–5.81)
RDW: 15.9 % — ABNORMAL HIGH (ref 11.5–15.5)
WBC: 20.8 10*3/uL — ABNORMAL HIGH (ref 4.0–10.5)
nRBC: 0 % (ref 0.0–0.2)

## 2021-10-16 LAB — PROCALCITONIN: Procalcitonin: 0.35 ng/mL

## 2021-10-16 LAB — PHOSPHORUS: Phosphorus: 2.3 mg/dL — ABNORMAL LOW (ref 2.5–4.6)

## 2021-10-16 LAB — GLUCOSE, CAPILLARY
Glucose-Capillary: 100 mg/dL — ABNORMAL HIGH (ref 70–99)
Glucose-Capillary: 194 mg/dL — ABNORMAL HIGH (ref 70–99)
Glucose-Capillary: 216 mg/dL — ABNORMAL HIGH (ref 70–99)
Glucose-Capillary: 193 mg/dL — ABNORMAL HIGH (ref 70–99)

## 2021-10-16 LAB — CBG MONITORING, ED: Glucose-Capillary: 99 mg/dL (ref 70–99)

## 2021-10-16 LAB — MAGNESIUM: Magnesium: 1.8 mg/dL (ref 1.7–2.4)

## 2021-10-16 MED ORDER — HYDROCORTISONE SOD SUC (PF) 100 MG IJ SOLR
50.0000 mg | Freq: Four times a day (QID) | INTRAMUSCULAR | Status: DC
Start: 2021-10-16 — End: 2021-10-17
  Administered 2021-10-16 – 2021-10-17 (×6): 50 mg via INTRAVENOUS
  Filled 2021-10-16 (×6): qty 2

## 2021-10-16 MED ORDER — HYDROCORTISONE 10 MG PO TABS: 10.0000 mg | ORAL_TABLET | Freq: Every day | ORAL | Status: DC

## 2021-10-16 MED ORDER — HYDROCORTISONE 10 MG PO TABS
10.0000 mg | ORAL_TABLET | Freq: Two times a day (BID) | ORAL | Status: DC
Start: 2021-10-16 — End: 2021-10-16

## 2021-10-16 MED ORDER — ENSURE ENLIVE PO LIQD
237.0000 mL | Freq: Two times a day (BID) | ORAL | Status: DC
  Administered 2021-10-17: 237 mL via ORAL

## 2021-10-16 MED ORDER — ADULT MULTIVITAMIN W/MINERALS CH
1.0000 | ORAL_TABLET | Freq: Every day | ORAL | Status: DC
  Administered 2021-10-16 – 2021-10-19 (×4): 1 via ORAL
  Filled 2021-10-16 (×4): qty 1

## 2021-10-16 MED ORDER — CHLORHEXIDINE GLUCONATE CLOTH 2 % EX PADS
6.0000 | MEDICATED_PAD | Freq: Every day | CUTANEOUS | Status: DC
  Administered 2021-10-16 – 2021-10-19 (×4): 6 via TOPICAL

## 2021-10-16 MED ORDER — HYDROCORTISONE 20 MG PO TABS
20.0000 mg | ORAL_TABLET | Freq: Every day | ORAL | Status: DC
  Filled 2021-10-16: qty 1

## 2021-10-16 MED ORDER — MIDODRINE HCL 5 MG PO TABS
10.0000 mg | ORAL_TABLET | Freq: Three times a day (TID) | ORAL | Status: DC
  Administered 2021-10-16 – 2021-10-19 (×11): 10 mg via ORAL
  Filled 2021-10-16 (×11): qty 2

## 2021-10-16 MED ORDER — POTASSIUM CHLORIDE CRYS ER 20 MEQ PO TBCR
40.0000 meq | EXTENDED_RELEASE_TABLET | Freq: Once | ORAL | Status: AC
  Administered 2021-10-16: 40 meq via ORAL
  Filled 2021-10-16: qty 2

## 2021-10-16 MED ORDER — INSULIN ASPART 100 UNIT/ML IJ SOLN: 0.0000 [IU] | Freq: Every day | INTRAMUSCULAR | Status: DC

## 2021-10-16 MED ORDER — SODIUM CHLORIDE 0.9 % IV SOLN: INTRAVENOUS | Status: AC

## 2021-10-16 MED ORDER — SODIUM CHLORIDE 0.9 % IV SOLN
2.0000 g | INTRAVENOUS | Status: DC
  Administered 2021-10-16 – 2021-10-17 (×2): 2 g via INTRAVENOUS
  Filled 2021-10-16 (×2): qty 20

## 2021-10-16 MED ORDER — SODIUM CHLORIDE 0.9 % IV BOLUS
500.0000 mL | Freq: Once | INTRAVENOUS | Status: AC
  Administered 2021-10-16: 500 mL via INTRAVENOUS

## 2021-10-16 MED ORDER — METRONIDAZOLE 500 MG/100ML IV SOLN
500.0000 mg | Freq: Two times a day (BID) | INTRAVENOUS | Status: DC
  Administered 2021-10-16 – 2021-10-17 (×4): 500 mg via INTRAVENOUS
  Filled 2021-10-16 (×5): qty 100

## 2021-10-16 MED ORDER — MELATONIN 3 MG PO TABS
6.0000 mg | ORAL_TABLET | Freq: Every evening | ORAL | Status: DC | PRN
  Administered 2021-10-16: 6 mg via ORAL
  Filled 2021-10-16: qty 2

## 2021-10-16 MED ORDER — NOREPINEPHRINE 4 MG/250ML-% IV SOLN
2.0000 ug/min | INTRAVENOUS | Status: DC
  Administered 2021-10-16: 3 ug/min via INTRAVENOUS
  Administered 2021-10-17: 2 ug/min via INTRAVENOUS
  Filled 2021-10-16 (×2): qty 250

## 2021-10-16 MED ORDER — INSULIN ASPART 100 UNIT/ML IJ SOLN
0.0000 [IU] | Freq: Three times a day (TID) | INTRAMUSCULAR | Status: DC
  Administered 2021-10-16: 3 [IU] via SUBCUTANEOUS
  Administered 2021-10-16 – 2021-10-17 (×2): 5 [IU] via SUBCUTANEOUS
  Administered 2021-10-17: 3 [IU] via SUBCUTANEOUS
  Administered 2021-10-17: 5 [IU] via SUBCUTANEOUS
  Administered 2021-10-18 (×2): 3 [IU] via SUBCUTANEOUS
  Administered 2021-10-18 – 2021-10-19 (×2): 2 [IU] via SUBCUTANEOUS

## 2021-10-16 MED ORDER — GLUCERNA SHAKE PO LIQD
237.0000 mL | Freq: Three times a day (TID) | ORAL | Status: DC
Start: 2021-10-16 — End: 2021-10-16

## 2021-10-16 MED ORDER — SODIUM CHLORIDE 0.9 % IV SOLN: 250.0000 mL | INTRAVENOUS | Status: DC

## 2021-10-16 NOTE — TOC Initial Note (Signed)
Transition of Care (TOC) - Initial/Assessment Note  ? ? ?Patient Details  ?Name: Henry Johns ?MRN: [id number] ?Date of Birth: January 09, 1961 ? ?Transition of Care (TOC) CM/SW Contact:    ?Boneta Lucks, RN ?Phone Number: ?10/16/2021, 3:16 PM ? ?Clinical Narrative:   Patient admitted with Acute diarrhea. Patient has a high risk for readmission. TOC spoke to his wife, Suanne Marker. She care for him at home and drives him to appointments. He walks with a Cane or walker and is independent with ADL's.  She would like home health at discharge if available. TOC to follow.               ? ? ?Expected Discharge Plan: Home/Self Care ?Barriers to Discharge: Continued Medical Work up ? ?Patient Goals and CMS Choice ?Patient states their goals for this hospitalization and ongoing recovery are:: to go home. ?CMS Medicare.gov Compare Post Acute Care list provided to:: Patient Represenative (must comment) ?Choice offered to / list presented to : Spouse ? ?Expected Discharge Plan and Services ?Expected Discharge Plan: Home/Self Care ?    ?Living arrangements for the past 2 months: [place] ?               ?   ?Prior Living Arrangements/Services ?Living arrangements for the past 2 months: [place] ?Lives with:: Spouse ?Patient language and need for interpreter reviewed:: Yes ?       ?Need for Family Participation in Patient Care: Yes (Comment) ?Care giver support system in place?: Yes (comment) ?  ?Criminal Activity/Legal Involvement Pertinent to Current Situation/Hospitalization: No - Comment as needed ? ?Activities of Daily Living ?Home Assistive Devices/Equipment: CBG Meter, Walker (specify type) ?ADL Screening (condition at time of admission) ?Patient's cognitive ability adequate to safely complete daily activities?: Yes ?Is the patient deaf or have difficulty hearing?: No ?Does the patient have difficulty seeing, even when wearing glasses/contacts?: No ?Does the patient have difficulty concentrating, remembering,  or making decisions?: No ?Patient able to express need for assistance with ADLs?: Yes ?Does the patient have difficulty dressing or bathing?: Yes ?Independently performs ADLs?: No ?Communication: Independent ?Dressing (OT): Needs assistance ?Is this a change from baseline?: Pre-admission baseline ?Grooming: Needs assistance ?Is this a change from baseline?: Pre-admission baseline ?Feeding: Independent ?Bathing: Needs assistance ?Is this a change from baseline?: Pre-admission baseline ?Toileting: Independent with device (comment) ?In/Out Bed: Needs assistance ?Is this a change from baseline?: Pre-admission baseline ?Walks in Home: Independent with device (comment) ?Does the patient have difficulty walking or climbing stairs?: Yes ?Weakness of Legs: Both ?Weakness of Arms/Hands: Both ? ?Permission Sought/Granted ?   ? ?Emotional Assessment ?    ?Orientation: : Oriented to Self, Oriented to Place, Oriented to  Time, Oriented to Situation ?Alcohol / Substance Use: Not Applicable ?Psych Involvement: No (comment) ? ?Admission diagnosis:  Dehydration [E86.0] ?Acute diarrhea [R19.7] ?Diarrhea of infectious origin [A09] ?Patient Active Problem List  ? Diagnosis Date Noted  ? Generalized weakness 10/16/2021  ? Normocytic anemia 10/16/2021  ? Hypoalbuminemia 10/16/2021  ? Elevated alpha fetoprotein 10/16/2021  ? Ascites 10/16/2021  ? Acute diarrhea 10/15/2021  ? Hypotension 09/09/2021  ? Colitis 06/01/2021  ? Hypokalemia 06/01/2021  ? Hyperlipidemia 06/01/2021  ? Chronic pain 06/01/2021  ? 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 Metastasis to liver ([place]) 01/04/2021  ? Colon cancer metastasized to liver [facility]) 01/04/2021  ? Other constipation 01/04/2021  ? Rectal bleeding 01/04/2021  ? Nausea without vomiting 01/04/2021  ? 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 Diabetes mellitus with neuropathy (  [place])   ? ?PCP:  [facility] ?Pharmacy:   ?[place], [place] [address] ?[address] ?[place] [address] ?Phone: [phone] Fax: [phone] ? ?[place] ?[place] [address] ?Phone: [phone] Fax: [phone] ? ?Readmission Risk Interventions ? ?  10/16/2021  ?  3:14 PM  ?Readmission Risk Prevention Plan  ?Transportation Screening Complete  ?Medication Review Press photographer) Complete  ?PCP or Specialist appointment within 3-5 days of discharge Not Complete  ?[place] or Home Care Consult Complete  ?SW Recovery Care/Counseling Consult Complete  ?Palliative Care Screening Not Complete  ?[place] Not Applicable  ? ? ? ?

## 2021-10-16 NOTE — Progress Notes (Signed)
?Progress Note ? ?Patient: Henry Johns GXQ:[id number] DOB: 05-Apr-1961  ?DOA: 10/15/2021  DOS: 10/16/2021  ?  ?Brief hospital course: ?HPI: Henry Johns is a 61 y.o. 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  Patient follows with Dr. Delton Coombes on 4/3 when he had most recent chemotherapy (Monday through Wednesday).  He complained of diarrhea which started on Monday and continued with last bowel movement being this afternoon around 4 PM.  Patient complained of generalized weakness and fatigue, but denies fever, chills, chest pain, shortness of breath, headache. ?Patient was admitted from 3/3 to 3/5 due to hypotension and was discharged with midodrine and hydrocortisone.  Spironolactone was held, but patient continued on Lasix as needed. ?  ?  ?ED Course:  ?In the emergency department, he was noted to be hypotensive with BP of 88/66, other vital signs were within normal range.  Work-up in the ED showed leukocytosis, normocytic anemia, thrombocytopenia, BMP showed hyponatremia, hypokalemia, hyperglycemia, ALP 325. ?CT abdomen and pelvis with contrast showed hepatic metastatic disease which has increased in the interval from prior exam. Diffuse wall thickening in the sigmoid colon is noted although no discrete mass lesion is noted.  Large volume ascites with evidence of a small umbilical hernia.  Mild increase in pulmonary nodules consistent with progressive metastatic disease ?Patient was empirically treated with IV ciprofloxacin, Flagyl.  IV hydration was provided, potassium was replenished. ?Dr. Delton Coombes (hematology/oncology) was consulted and recommended admitting patient to medicine.  CT and GI stool panel pending. ? ?Assessment and Plan: ?Generalized weakness possibly secondary to acute diarrhea and electrolyte abnormalities:   ?- PT eval when more stable ? ?Diarrhea:  ?- C. difficile and GI stool panel ordered, no sample yet given. ?- With decreased GI losses and taking po, will stop IVF. ?- Empiric Tx. Change cipro to ceftriaxone, continue flagyl ?  ?Hypotension: Multifactorial, but inclusive of deconditioning, adrenal insufficiency, decreased EABV with diarrhea.  ?- Hold lasix, augment hydrocortisone in effort to taper levophed.  ? ?Large volume ascites, recurrent, malignant: Getting therapeutic paracenteses as outpatient weekly.  ?- IR has been consulted for possible paracentesis, may need to delay until BP rising. ? ?Hypokalemia: Supplemented ?- Recheck in AM ? ?Hyponatremia/dehydration: Improving ?- Monitor off IVF. Has been given isotonic saline all day at 75cc/hr ? ?Hypoalbuminemia possibly secondary to moderate protein calorie malnutrition.  ?- Protein supplementation.  ? ?Leukocytosis possibly reactive vs infectious: Monitor with treatment. ? ?Elevated alkaline phosphatase due to liver mets, downtrending. TBili wnl. ? ?Anemia of malignancy:  ?- Hgb down to 98g/dl, recheck in AM.  ? ?Thrombocytopenia: ?due to chemotherapy and/or  ?- Continue monitoring closely ? ?Metastatic colon cancer to the liver and lungs: Recent imaging suggesting progressive enlargement.  ?- Patient follows with Dr. Delton Coombes who is consulted, last visit was on 4/3 ? ?Essential hypertension: Hold antiHTN meds for now. ? ?T2DM with hyperglycemia: Chronically well-controlled with (?spurious) HbA1c of 4.4%.  ? ?Subjective: Tolerating po and hasn't had stool since arrival yesterday evening. No abd pain, N/V. Levo turned down to 3 this morning, 2 this evening.  ? ?Objective: ?Vitals:  ? 10/16/21 1400 10/16/21 1500 10/16/21 1600 10/16/21 1629  ?BP: 106/61 (!) 100/53 (!) 114/57 (!) 106/58  ?Pulse: 93 79 91 89  ?Resp: '15 13 13 12  '$ ?Temp:    97.8 ?F (36.6 ?C)  ?TempSrc:  Axillary  ?SpO2: 100% 98% 100% 98%  ?Weight:      ?Height:      ? ?Gen: Frail 61 y.o. male in  no distress ?Pulm: Nonlabored breathing room air. Clear ?CV: Regular rate and rhythm. No murmur, rub, or gallop. No JVD, 1+ LE dependent edema. ?GI: Abdomen distended, non-tender, +fluid wave with reducible hernia, with normoactive bowel sounds.  ?Ext: Warm, no deformities ?Skin: No rashes, lesions or ulcers on visualized skin. ?Neuro: Alert and oriented. No focal neurological deficits. ?Psych: Judgement and insight appear fair. Mood euthymic & affect congruent. Behavior is appropriate.   ? ?Data Personally reviewed: ? ?CBC: ?Recent Labs  ?Lab 10/15/21 ?1740 10/16/21 ?0432  ?WBC 24.9* 20.8*  ?NEUTROABS 21.0*  --   ?HGB 10.3* 9.0*  ?HCT 29.8* 25.9*  ?MCV 89.0 88.7  ?PLT 98* 86*  ? ?Basic Metabolic Panel: ?Recent Labs  ?Lab 10/15/21 ?1740 10/16/21 ?0432  ?NA 130* 134*  ?K 3.0* 3.3*  ?CL 99 107  ?CO2 21* 22  ?GLUCOSE 168* 107*  ?BUN 29* 21*  ?CREATININE 0.83 0.63  ?CALCIUM 8.2* 7.7*  ?MG  --  1.8  ?PHOS  --  2.3*  ? ?GFR: ?Estimated Creatinine Clearance: 90.1 mL/min (by C-G formula based on SCr of 0.63 mg/dL). ?Liver Function Tests: ?Recent Labs  ?Lab 10/15/21 ?1740 10/16/21 ?0432  ?AST 33 29  ?ALT 19 16  ?ALKPHOS 325* 270*  ?BILITOT 1.4* 1.1  ?PROT 5.1* 4.3*  ?ALBUMIN 2.8* 2.4*  ? ?No results for input(s): LIPASE, AMYLASE in the last 168 hours. ?No results for input(s): AMMONIA in the last 168 hours. ?Coagulation Profile: ?No results for input(s): INR, PROTIME in the last 168 hours. ?Cardiac Enzymes: ?No results for input(s): CKTOTAL, CKMB, CKMBINDEX, TROPONINI in the last 168 hours. ?BNP (last 3 results) ?No results for input(s): PROBNP in the last 8760 hours. ?HbA1C: ?No results for input(s): HGBA1C in the last 72 hours. ?CBG: ?Recent Labs  ?Lab 10/16/21 ?0009 10/16/21 ?9833 10/16/21 ?1123 10/16/21 ?1619  ?GLUCAP 99 100* 194* 216*  ? ?Lipid Profile: ?No results for input(s): CHOL, HDL, LDLCALC, TRIG, CHOLHDL, LDLDIRECT in the last 72 hours. ?Thyroid Function Tests: ?No results for input(s): TSH, T4TOTAL, FREET4,  T3FREE, THYROIDAB in the last 72 hours. ?Anemia Panel: ?No results for input(s): VITAMINB12, FOLATE, FERRITIN, TIBC, IRON, RETICCTPCT in the last 72 hours. ?Urine analysis: ?   ?Component Value Date/Time  ? COLORURINE YELLOW 10/09/2021 0810  ? APPEARANCEUR CLEAR 10/09/2021 0810  ? LABSPEC 1.016 10/09/2021 0810  ? PHURINE 5.0 10/09/2021 0810  ? GLUCOSEU NEGATIVE 10/09/2021 0810  ? [place] NEGATIVE 10/09/2021 0810  ? [place] NEGATIVE 10/09/2021 0810  ? [place] NEGATIVE 10/09/2021 0810  ? [place] NEGATIVE 10/09/2021 0810  ? NITRITE NEGATIVE 10/09/2021 0810  ? LEUKOCYTESUR NEGATIVE 10/09/2021 0810  ? ?Recent Results (from the past 240 hour(s))  ?Blood culture (routine x 2)     Status: None (Preliminary result)  ? Collection Time: 10/15/21 11:56 PM  ? Specimen: Right Antecubital; Blood  ?Result Value Ref Range Status  ? Specimen Description RIGHT ANTECUBITAL  Final  ? Special Requests   Final  ?  BOTTLES DRAWN AEROBIC AND ANAEROBIC Blood Culture results may not be optimal due to an excessive volume of blood received in culture bottles  ? Culture   Final  ?  NO GROWTH < 12 HOURS ?Performed at [facility], [address] [place],  [address] ?  ? Report Status PENDING  Incomplete  ?MRSA Next Gen by PCR, Nasal  Status: None  ? Collection Time: 10/16/21  5:26 AM  ? Specimen: Nasal Mucosa; Nasal Swab  ?Result Value Ref Range Status  ? MRSA by PCR Next Gen NOT DETECTED NOT DETECTED Final  ?  Comment: (NOTE) ?The GeneXpert MRSA Assay (FDA approved for NASAL specimens only), ?is one component of a comprehensive MRSA colonization surveillance ?program. It is not intended to diagnose MRSA infection nor to guide ?or monitor treatment for MRSA infections. ?Test performance is not FDA approved in patients less than 2 years ?old. ?Performed at [facility], [address] [place], [place] [address] ?  ?   ?CT ABDOMEN PELVIS W CONTRAST ? ?Result Date: 10/15/2021 ?CLINICAL DATA:  Clinical history of metastatic colon  carcinoma with acute abdominal pain, initial encounter EXAM: CT ABDOMEN AND PELVIS WITH CONTRAST TECHNIQUE: Multidetector CT imaging of the abdomen and pelvis was performed using the standard protocol follow

## 2021-10-16 NOTE — ED Notes (Addendum)
Pt BP 74/49. Dr notified, 561m bolus given. ?

## 2021-10-16 NOTE — Discharge Planning (Signed)
Oncology Discharge Planning Admission Note ? ?[facility] at [facility] ?Address: [address] [place], [place] [address] ?Hours of Operation:  8am - 5pm, Monday - Friday  ?Clinic Contact Information:  [phone] ? ?Oncology Care Team: ?Medical Oncologist:  Dr. Derek Jack ? ?Dr. Delton Coombes is aware of this hospital admission dated 10/15/21. The cancer center will follow Melba Coon inpatient care to assist with discharge planning as indicated by the oncologist.  We will schedule earlier follow up if necessary prior to discharge. ? ? ?Disclaimer:  This [facility] note does not imply a formal consult request has been made by the admitting attending for this admission or there will be an inpatient consult completed by oncology.  Please request oncology consults as per standard process as indicated. ?

## 2021-10-16 NOTE — Progress Notes (Signed)
Initial Nutrition Assessment ? ?DOCUMENTATION CODES:  ? ?Not applicable ? ?INTERVENTION:  ?- Liberalize diet from a heart healthy/carb modified to a regular diet to provide widest variety of menu options to enhance nutritional adequacy ? ?- Discontinue Glucerna Shakes ? ?- Ensure Enlive po BID, each supplement provides 350 kcal and 20 grams of protein. ? ?- Magic cup TID with meals, each supplement provides 290 kcal and 9 grams of protein ? ?- MVI with minerals daily ? ?NUTRITION DIAGNOSIS:  ? ?Increased nutrient needs related to cancer and cancer related treatments as evidenced by estimated needs. ? ?GOAL:  ? ?Patient will meet greater than or equal to 90% of their needs ? ?MONITOR:  ? ?PO intake, Supplement acceptance, Labs, Weight trends ? ?REASON FOR ASSESSMENT:  ? ?Malnutrition Screening Tool ?  ? ?ASSESSMENT:  ? ?Pt admitted from home with diarrhea, generalized weakness and fatigue. 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 Followed by outpatient oncology with recent chemo treatment Monday -Wednesday. ? ?Spoke with pt's wife, Suanne Marker, over the phone as pt is sleeping. She states that he has had a very poor appetite for a few weeks now which she attributes to his chemotherapy treatments and nausea without emesis. He has only been able to tolerate a couple bites of food a day. During admission, she states that he has eaten 2 meals. Spoke with RN who states pt has completed 60% of each of these meals. Pt has Glucerna ordered but his wife states that he does not like the strawberry very much. She is agreeable to see if he may like chocolate Ensure in place of the Glucerna. At home he uses insulin for his diabetes as needed.  ? ?Suanne Marker states that he used to weigh a little over 200 lbs about 6 months ago, prior to getting sick. She endorses weight loss as his clothes have been fitting more loosely but is unsure how much as he does not weigh himself at home.   ? ?Reviewed weight history. Pt has had a significant weight loss within the last 6 months, however noted recent paracentesis on 03/22 with 4L of fluid removed. Current CT scan findings with large amount of ascites present. Suspect that this is contributing to his weight changes, therefore unable to determine how much is true dry weight loss at this time. Will continue to monitor weight trends.  ? ?Suspect a degree of malnutrition is present, however unable to confirm at this time given inability to perform nutrition focused physical exam. Will reassess at follow up. ? ?Moderate pitting BLE edema present per nursing documentation. ? ?Medications: SSI ? ?IV medications: rocephin, flagyl, levophed @ 11.35m/hr ? ?Labs: sodium 134, potassium 3.3 (replacing), BUN 21, corrected calcium 8.98, phos 2.3, alkaline phosphatase 270,  ? HgbA1c 4.4%, CBG's 194, 100, 99 ? ?NUTRITION - FOCUSED PHYSICAL EXAM: ?RD working remotely. Deferred to follow up. ? ?Diet Order:   ?Diet Order   ? ?       ?  Diet regular Room service appropriate? Yes; Fluid consistency: Thin  Diet effective now       ?  ? ?  ?  ? ?  ? ?EDUCATION NEEDS:  ? ?No education needs have been identified at this time ? ?Skin:  Skin Assessment: Reviewed RN Assessment ? ?Last BM:  PTA ? ?Height:  ? ?Ht Readings from Last 1 Encounters:  ?10/15/21 '5\' 8"'$  (1.727 m)  ? ? ?Weight:  ? ?Wt Readings from Last  1 Encounters:  ?10/15/21 64.9 kg  ? ?BMI:  Body mass index is 21.74 kg/m?. ? ?Estimated Nutritional Needs:  ? ?Kcal:  2000-2200 ? ?Protein:  100-115g ? ?Fluid:  >/=2L ? ?Clayborne Dana, RDN, LDN ?Clinical Nutrition ?

## 2021-10-17 ENCOUNTER — Ambulatory Visit (HOSPITAL_COMMUNITY): Payer: Medicaid Other | Attending: Hematology

## 2021-10-17 ENCOUNTER — Telehealth (HOSPITAL_COMMUNITY): Payer: Self-pay

## 2021-10-17 DIAGNOSIS — R197 Diarrhea, unspecified: Secondary | ICD-10-CM | POA: Diagnosis not present

## 2021-10-17 DIAGNOSIS — C189 Malignant neoplasm of colon, unspecified: Secondary | ICD-10-CM | POA: Diagnosis not present

## 2021-10-17 DIAGNOSIS — E440 Moderate protein-calorie malnutrition: Secondary | ICD-10-CM | POA: Insufficient documentation

## 2021-10-17 DIAGNOSIS — E1140 Type 2 diabetes mellitus with diabetic neuropathy, unspecified: Secondary | ICD-10-CM | POA: Diagnosis not present

## 2021-10-17 DIAGNOSIS — E43 Unspecified severe protein-calorie malnutrition: Secondary | ICD-10-CM | POA: Insufficient documentation

## 2021-10-17 DIAGNOSIS — R180 Malignant ascites: Secondary | ICD-10-CM | POA: Diagnosis not present

## 2021-10-17 LAB — C DIFFICILE QUICK SCREEN W PCR REFLEX
C Diff antigen: POSITIVE — AB
C Diff toxin: NEGATIVE

## 2021-10-17 LAB — BASIC METABOLIC PANEL
Anion gap: 8 (ref 5–15)
BUN: 18 mg/dL (ref 6–20)
CO2: 22 mmol/L (ref 22–32)
Calcium: 7.9 mg/dL — ABNORMAL LOW (ref 8.9–10.3)
Chloride: 103 mmol/L (ref 98–111)
Creatinine, Ser: 0.69 mg/dL (ref 0.61–1.24)
GFR, Estimated: >60 mL/min (ref >60)
Glucose, Bld: 286 mg/dL — ABNORMAL HIGH (ref 70–99)
Potassium: 3.2 mmol/L — ABNORMAL LOW (ref 3.5–5.1)
Sodium: 133 mmol/L — ABNORMAL LOW (ref 135–145)

## 2021-10-17 LAB — CBC
HCT: 24.8 % — ABNORMAL LOW (ref 39.0–52.0)
Hemoglobin: 8.6 g/dL — ABNORMAL LOW (ref 13.0–17.0)
MCH: 30.4 pg (ref 26.0–34.0)
MCHC: 34.7 g/dL (ref 30.0–36.0)
MCV: 87.6 fL (ref 80.0–100.0)
Platelets: 77 10*3/uL — ABNORMAL LOW (ref 150–400)
RBC: 2.83 MIL/uL — ABNORMAL LOW (ref 4.22–5.81)
RDW: 15.7 % — ABNORMAL HIGH (ref 11.5–15.5)
WBC: 24.3 10*3/uL — ABNORMAL HIGH (ref 4.0–10.5)
nRBC: 0 % (ref 0.0–0.2)

## 2021-10-17 LAB — GASTROINTESTINAL PANEL BY PCR, STOOL (REPLACES STOOL CULTURE)

## 2021-10-17 LAB — GLUCOSE, CAPILLARY
Glucose-Capillary: 249 mg/dL — ABNORMAL HIGH (ref 70–99)
Glucose-Capillary: 250 mg/dL — ABNORMAL HIGH (ref 70–99)
Glucose-Capillary: 193 mg/dL — ABNORMAL HIGH (ref 70–99)
Glucose-Capillary: 170 mg/dL — ABNORMAL HIGH (ref 70–99)

## 2021-10-17 LAB — CLOSTRIDIUM DIFFICILE BY PCR, REFLEXED: Toxigenic C. Difficile by PCR: POSITIVE — AB

## 2021-10-17 MED ORDER — HYDROCORTISONE SOD SUC (PF) 100 MG IJ SOLR
50.0000 mg | Freq: Two times a day (BID) | INTRAMUSCULAR | Status: DC
  Administered 2021-10-18: 50 mg via INTRAVENOUS
  Filled 2021-10-17: qty 2

## 2021-10-17 MED ORDER — POTASSIUM CHLORIDE CRYS ER 20 MEQ PO TBCR
40.0000 meq | EXTENDED_RELEASE_TABLET | Freq: Once | ORAL | Status: AC
  Administered 2021-10-17: 40 meq via ORAL
  Filled 2021-10-17: qty 2

## 2021-10-17 MED ORDER — BOOST / RESOURCE BREEZE PO LIQD CUSTOM
1.0000 | Freq: Three times a day (TID) | ORAL | Status: DC
  Administered 2021-10-17: 1 via ORAL

## 2021-10-17 MED ORDER — ONDANSETRON HCL 4 MG/2ML IJ SOLN
4.0000 mg | Freq: Four times a day (QID) | INTRAMUSCULAR | Status: DC | PRN
Start: 2021-10-17 — End: 2021-10-19
  Administered 2021-10-17: 4 mg via INTRAVENOUS
  Filled 2021-10-17: qty 2

## 2021-10-17 MED ORDER — PROCHLORPERAZINE MALEATE 5 MG PO TABS
10.0000 mg | ORAL_TABLET | Freq: Four times a day (QID) | ORAL | Status: DC | PRN
  Filled 2021-10-17: qty 2

## 2021-10-17 NOTE — Progress Notes (Signed)
Inpatient Diabetes Program Recommendations ? ?AACE/ADA: New Consensus Statement on Inpatient Glycemic Control  ? ?Target Ranges:  Prepandial:   less than 140 mg/dL ?     Peak postprandial:   less than 180 mg/dL (1-2 hours) ?     Critically ill patients:  140 - 180 mg/dL  ? ? Latest Reference Range & Units 10/16/21 07:56 10/16/21 11:23 10/16/21 16:19 10/16/21 21:33 10/17/21 07:41  ?Glucose-Capillary 70 - 99 mg/dL 100 (H) 194 (H) 216 (H) 193 (H) 249 (H)  ?(H): Data is abnormally high ? Latest Reference Range & Units 05/31/21 20:26 09/09/21 06:34  ?Hemoglobin A1C 4.8 - 5.6 % 5.0 4.4 (L)  ?(L): Data is abnormally low ?Review of Glycemic Control ? ?Diabetes history: DM2 ?Outpatient Diabetes medications: 70/30 10-12 units BID ?Current orders for Inpatient glycemic control: Novolog 0-15 units TID with meals, Novolog 0-5 units QHS; Solcortef 50 mg Q6H ? ?Inpatient Diabetes Program Recommendations:   ? ?Insulin: If steroids are continued, please consider ordering Levemir 5 units Q24H.  ? ?Thanks, ?Barnie Alderman, RN, MSN, CDE ?Diabetes Coordinator ?Inpatient Diabetes Program ?[phone] (Team Pager from 8am to 5pm) ? ? ? ? ?

## 2021-10-17 NOTE — Plan of Care (Signed)
?  Problem: Education: ?Goal: Knowledge of General Education information will improve ?Description: Including pain rating scale, medication(s)/side effects and non-pharmacologic comfort measures ?Outcome: Progressing ?  ?Problem: Health Behavior/Discharge Planning: ?Goal: Ability to manage health-related needs will improve ?Outcome: Progressing ?  ?Problem: Clinical Measurements: ?Goal: Will remain free from infection ?Outcome: Progressing ?Goal: Respiratory complications will improve ?Outcome: Progressing ?  ?Problem: Coping: ?Goal: Level of anxiety will decrease ?Outcome: Progressing ?  ?Problem: Elimination: ?Goal: Will not experience complications related to bowel motility ?Outcome: Progressing ?Goal: Will not experience complications related to urinary retention ?Outcome: Progressing ?  ?Problem: Pain Managment: ?Goal: General experience of comfort will improve ?Outcome: Progressing ?  ?Problem: Safety: ?Goal: Ability to remain free from injury will improve ?Outcome: Progressing ?  ?Problem: Clinical Measurements: ?Goal: Ability to maintain clinical measurements within normal limits will improve ?Outcome: Not Progressing ?Goal: Diagnostic test results will improve ?Outcome: Not Progressing ?Goal: Cardiovascular complication will be avoided ?Outcome: Not Progressing ?  ?Problem: Activity: ?Goal: Risk for activity intolerance will decrease ?Outcome: Not Progressing ?  ?Problem: Nutrition: ?Goal: Adequate nutrition will be maintained ?Outcome: Not Progressing ?  ?Problem: Skin Integrity: ?Goal: Risk for impaired skin integrity will decrease ?Outcome: Not Progressing ?  ?

## 2021-10-17 NOTE — Progress Notes (Signed)
Lab called and stated they needed more stool for the C-Diff stool sample. Another sample collected and sent to the lab.  ?

## 2021-10-17 NOTE — Telephone Encounter (Signed)
Called and left message concerning would to assess if pt needs to return to therapy or comfortable with self care. ? ?Ihor Austin, LPTA/CLT; CBIS ?[phone] ? ?

## 2021-10-17 NOTE — Progress Notes (Signed)
Levophed gtt stopped at 1006 due to resolving BP's. Will continue to monitor.  ?

## 2021-10-17 NOTE — Progress Notes (Addendum)
Nutrition Follow-up ? ?DOCUMENTATION CODES:  ? ?Non-severe (moderate) malnutrition in context of chronic illness ? ?INTERVENTION:  ?- Encourage PO intake ?- Discontinue Ensure ?- Boost Breeze po TID, each supplement provides 250 kcal and 9 grams of protein ?- Magic cup TID with meals, each supplement provides 290 kcal and 9 grams of protein ?- Continue MVI with minerals daily ? ?NUTRITION DIAGNOSIS:  ? 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 ? ?GOAL:  ? ?Patient will meet greater than or equal to 90% of their needs ? ?Improving ? ?MONITOR:  ? ?PO intake, Supplement acceptance, Labs, Weight trends ? ?REASON FOR ASSESSMENT:  ? ?Malnutrition Screening Tool ?  ? ?ASSESSMENT:  ? ?Pt admitted from home with diarrhea, generalized weakness and fatigue. 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 Followed by outpatient oncology with recent chemo treatment Monday -Wednesday. ? ?Spoke with pt and wife at bedside. He reports his PO intake is improving. Documented meal completions from 60% x 2 meals yesterday now at 80% x 2 recorded meals today. He states that he cannot drink the Glucerna or Ensure at it makes him feel as though he is going to vomit. Discussed with increased PO intake, switching to [employer]. He is in agreement to try this supplement.   ? ?PTA, pt reports good mobility.  ? ?Weight is +5.5 kg since 04/03. This is likely attributed to IV fluids and presence of ascites.  ? ?Nursing documentation of moderate pitting BLE edema.  ? ?Medications: solu-cortef, SSI 0-15 units TID, SSI 0-5 units QHS ?IV medications: rocephin, flagyl, PRN zofran ? ?Labs: sodium 133, potassium 3.2, CBG's 99-250 x24 hours ? ?NUTRITION - FOCUSED PHYSICAL EXAM: ? ?Flowsheet Row Most Recent Value  ?Orbital Region Mild depletion   ?Upper Arm Region Moderate depletion  ?Thoracic and Lumbar Region No depletion  ?Buccal Region No depletion  ?Temple Region No depletion  ?Clavicle Bone Region Moderate depletion  ?Clavicle and Acromion Bone Region Moderate depletion  ?Scapular Bone Region Mild depletion  ?Dorsal Hand No depletion  ?Patellar Region Moderate depletion  ?Anterior Thigh Region Moderate depletion  ?Posterior Calf Region Mild depletion  ?Edema (RD Assessment) Moderate  [BLE]  ?Hair Reviewed  ?Eyes Reviewed  ?Mouth Reviewed  ?Skin Reviewed  ?Nails Reviewed  ? ?  ? ? ?Diet Order:   ?Diet Order   ? ?       ?  Diet regular Room service appropriate? Yes; Fluid consistency: Thin  Diet effective now       ?  ? ?  ?  ? ?  ? ? ?EDUCATION NEEDS:  ? ?Education needs have been addressed ? ?Skin:  Skin Assessment: Reviewed RN Assessment ? ?Last BM:  4/11 (type 6) ? ?Height:  ? ?Ht Readings from Last 1 Encounters:  ?10/15/21 '5\' 8"'$  (1.727 m)  ? ? ?Weight:  ? ?Wt Readings from Last 1 Encounters:  ?10/17/21 68.8 kg  ? ?BMI:  Body mass index is 23.06 kg/m?. ? ?Estimated Nutritional Needs:  ? ?Kcal:  2000-2200 ? ?Protein:  100-115g ? ?Fluid:  >/=2L ? ?Clayborne Dana, RDN, LDN ?Clinical Nutrition ?

## 2021-10-17 NOTE — Progress Notes (Signed)
?Progress Note ? ?Patient: Henry Johns:[id number] DOB: 05/10/1961  ?DOA: 10/15/2021  DOS: 10/17/2021  ?  ?Brief hospital course: ?HPI: Henry Johns is a 61 y.o. 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  Patient follows with Dr. Delton Johns on 4/3 when he had most recent chemotherapy (Monday through Wednesday).  He complained of diarrhea which started on Monday and continued with last bowel movement being this afternoon around 4 PM.  Patient complained of generalized weakness and fatigue, but denies fever, chills, chest pain, shortness of breath, headache. ?Patient was admitted from 3/3 to 3/5 due to hypotension and was discharged with midodrine and hydrocortisone.  Spironolactone was held, but patient continued on Lasix as needed. ?  ?  ?ED Course:  ?In the emergency department, he was noted to be hypotensive with BP of 88/66, other vital signs were within normal range.  Work-up in the ED showed leukocytosis, normocytic anemia, thrombocytopenia, BMP showed hyponatremia, hypokalemia, hyperglycemia, ALP 325. ?CT abdomen and pelvis with contrast showed hepatic metastatic disease which has increased in the interval from prior exam. Diffuse wall thickening in the sigmoid colon is noted although no discrete mass lesion is noted.  Large volume ascites with evidence of a small umbilical hernia.  Mild increase in pulmonary nodules consistent with progressive metastatic disease ?Patient was empirically treated with IV ciprofloxacin, Flagyl.  IV hydration was provided, potassium was replenished. ?Dr. Delton Johns (hematology/oncology) was consulted and recommended admitting patient to medicine.  CT and GI stool panel pending. ? ?Assessment and Plan: ?Generalized weakness possibly secondary to acute diarrhea and electrolyte abnormalities:   ?- PT eval now that he's more stable. ? ?Diarrhea: C. difficile assay inconclusive (+Ag, no toxin detected) Toxigenic C. diff PCR pending.  ?- C. difficile and GI stool panel ordered, no sample yet given. ?- With decreased GI losses and taking po, stopped IVF. Continue antiemetics if needed. ?- Empiric Tx. Continue ceftriaxone, continue flagyl. If +CDiff PCR, would change to oral vancomycin. ?  ?Hypotension: Multifactorial, but inclusive of deconditioning, adrenal insufficiency, decreased EABV with diarrhea. Had admission for this previously and chronically runs low. ?- Hold lasix, continue hydrocortisone in effort to taper levophed. Update: Off levo since this morning, will transfer out of ICU. ?- Continue midodrine '10mg'$  TID ? ?Large volume ascites, recurrent, malignant: Getting therapeutic paracenteses as outpatient weekly.  ?- IR has been consulted for possible paracentesis, BP has improved. ? ?Hypokalemia: Supplemented ?- Recheck in AM ? ?Hyponatremia/dehydration: Improved ?- Monitor off IVF.   ? ?Hypoalbuminemia, moderate protein calorie malnutrition.  ?- Protein supplementation.  ? ?Leukocytosis possibly reactive vs infectious: Monitor with treatment. Difficult to evaluate in setting of steroids. ? ?Elevated alkaline phosphatase due to liver mets, downtrending. TBili wnl. ? ?Anemia of malignancy:  ?- Hgb down to 8.6g/dl, recheck in AM.  ? ?Thrombocytopenia: ?due to chemotherapy. No bleeding. ?- Continue monitoring closely ? ?Metastatic colon cancer to the liver and lungs: Recent imaging suggesting progressive enlargement.  ?- Patient follows with Dr. Delton Johns who is aware of hospitalization, last visit was on 4/3 ? ?Essential hypertension: Hold antiHTN meds for now. ? ?T2DM with hyperglycemia: Chronically well-controlled with (?spurious) HbA1c of 4.4%.  ?- Continue SSI with augmentation of steroid. Since tapering hydrocortisone, will keep SSI at current dosing. ? ?Subjective: Appetite is coming back, eating  better, no nausea, vomiting or abdominal pain, though it  feels like it's getting tighter.  ? ?Objective: ?Vitals:  ? 10/17/21 1500 10/17/21 1530 10/17/21 1600 10/17/21 1630  ?BP: 111/71 (!) 97/50 (!) 94/50 100/69  ?Pulse: 90 89 89 91  ?Resp: '17 15 13 11  '$ ?Temp:      ?TempSrc:      ?SpO2: 98% 98% 98% 99%  ?Weight:      ?Height:      ? ?Gen: 61 y.o. male in no distress ?Pulm: Nonlabored breathing room air. Clear. ?CV: Regular rate and rhythm. No murmur, rub, or gallop. No JVD, ++ dependent edema. ?GI: Abdomen distended, non-tender, +fluid wave with reducible hernia with normoactive bowel sounds.  ?Ext: Warm, no deformities ?Skin: No new rashes, lesions or ulcers on visualized skin. ?Neuro: Alert and oriented. No focal neurological deficits. ?Psych: Judgement and insight appear fair. Mood euthymic & affect congruent. Behavior is appropriate.   ? ?Data Personally reviewed: ? ?CBC: ?Recent Labs  ?Lab 10/15/21 ?1740 10/16/21 ?0432 10/17/21 ?0434  ?WBC 24.9* 20.8* 24.3*  ?NEUTROABS 21.0*  --   --   ?HGB 10.3* 9.0* 8.6*  ?HCT 29.8* 25.9* 24.8*  ?MCV 89.0 88.7 87.6  ?PLT 98* 86* 77*  ? ?Basic Metabolic Panel: ?Recent Labs  ?Lab 10/15/21 ?1740 10/16/21 ?0432 10/17/21 ?0434  ?NA 130* 134* 133*  ?K 3.0* 3.3* 3.2*  ?CL 99 107 103  ?CO2 21* 22 22  ?GLUCOSE 168* 107* 286*  ?BUN 29* 21* 18  ?CREATININE 0.83 0.63 0.69  ?CALCIUM 8.2* 7.7* 7.9*  ?MG  --  1.8  --   ?PHOS  --  2.3*  --   ? ?GFR: ?Estimated Creatinine Clearance: 95 mL/min (by C-G formula based on SCr of 0.69 mg/dL). ?Liver Function Tests: ?Recent Labs  ?Lab 10/15/21 ?1740 10/16/21 ?0432  ?AST 33 29  ?ALT 19 16  ?ALKPHOS 325* 270*  ?BILITOT 1.4* 1.1  ?PROT 5.1* 4.3*  ?ALBUMIN 2.8* 2.4*  ? ?No results for input(s): LIPASE, AMYLASE in the last 168 hours. ?No results for input(s): AMMONIA in the last 168 hours. ?Coagulation Profile: ?No results for input(s): INR, PROTIME in the last 168 hours. ?Cardiac Enzymes: ?No results for input(s): CKTOTAL, CKMB, CKMBINDEX, TROPONINI in  the last 168 hours. ?BNP (last 3 results) ?No results for input(s): PROBNP in the last 8760 hours. ?HbA1C: ?No results for input(s): HGBA1C in the last 72 hours. ?CBG: ?Recent Labs  ?Lab 10/16/21 ?1619 10/16/21 ?2133 10/17/21 ?0741 10/17/21 ?1135 10/17/21 ?1631  ?GLUCAP 216* 193* 249* 250* 193*  ? ?Lipid Profile: ?No results for input(s): CHOL, HDL, LDLCALC, TRIG, CHOLHDL, LDLDIRECT in the last 72 hours. ?Thyroid Function Tests: ?No results for input(s): TSH, T4TOTAL, FREET4, T3FREE, THYROIDAB in the last 72 hours. ?Anemia Panel: ?No results for input(s): VITAMINB12, FOLATE, FERRITIN, TIBC, IRON, RETICCTPCT in the last 72 hours. ?Urine analysis: ?   ?Component Value Date/Time  ? COLORURINE YELLOW 10/09/2021 0810  ? APPEARANCEUR CLEAR 10/09/2021 0810  ? LABSPEC 1.016 10/09/2021 0810  ? PHURINE 5.0 10/09/2021 0810  ? GLUCOSEU NEGATIVE 10/09/2021 0810  ? [place] NEGATIVE 10/09/2021 0810  ? [place] NEGATIVE 10/09/2021 0810  ? [place] NEGATIVE 10/09/2021 0810  ? [place] NEGATIVE 10/09/2021 0810  ? NITRITE NEGATIVE 10/09/2021 0810  ? LEUKOCYTESUR NEGATIVE 10/09/2021 0810  ? ?Recent Results (from the past 240 hour(s))  ?C Difficile Quick Screen w PCR reflex     Status: Abnormal  ? Collection Time: 10/15/21 10:28 PM  ? Specimen: STOOL  ?Result Value Ref Range Status  ? C Diff antigen POSITIVE (A) NEGATIVE  Final  ? C Diff toxin NEGATIVE NEGATIVE Final  ? C Diff interpretation Results are indeterminate. See PCR results.  Final  ?  Comment: RESULT CALLED TO, READ BACK BY AND VERIFIED WITH: ?E. FIERKE AT 0003 ON 04.11.23 BY ADGER J  ?Performed at [facility], [address], [place], [place] [address] ?  ?Gastrointestinal Panel by PCR , Stool     Status: None  ? Collection Time: 10/15/21 10:28 PM  ? Specimen: Stool  ?Result Value Ref Range Status  ? Campylobacter species NOT DETECTED NOT DETECTED Final  ? Plesimonas shigelloides NOT DETECTED NOT DETECTED Final  ? Salmonella species NOT DETECTED NOT DETECTED Final  ?  Yersinia enterocolitica NOT DETECTED NOT DETECTED Final  ? Vibrio species NOT DETECTED NOT DETECTED Final  ? Vibrio cholerae NOT DETECTED NOT DETECTED Final  ? Enteroaggregative E coli (EAEC) NOT DETECT

## 2021-10-17 NOTE — Telephone Encounter (Signed)
No show, called and left message concerning missed apt.  Pt will be discharged, left message to contact MD and get new order if feels need to return to wound care. ? ?Ihor Austin, LPTA/CLT; CBIS ?[phone] ? ?

## 2021-10-18 ENCOUNTER — Inpatient Hospital Stay (HOSPITAL_COMMUNITY): Payer: Medicaid Other

## 2021-10-18 ENCOUNTER — Encounter (HOSPITAL_COMMUNITY): Payer: Self-pay | Admitting: Hematology

## 2021-10-18 ENCOUNTER — Other Ambulatory Visit (HOSPITAL_COMMUNITY): Payer: Self-pay

## 2021-10-18 DIAGNOSIS — R197 Diarrhea, unspecified: Secondary | ICD-10-CM | POA: Diagnosis not present

## 2021-10-18 DIAGNOSIS — C189 Malignant neoplasm of colon, unspecified: Secondary | ICD-10-CM | POA: Diagnosis not present

## 2021-10-18 DIAGNOSIS — R180 Malignant ascites: Secondary | ICD-10-CM | POA: Diagnosis not present

## 2021-10-18 DIAGNOSIS — E1140 Type 2 diabetes mellitus with diabetic neuropathy, unspecified: Secondary | ICD-10-CM | POA: Diagnosis not present

## 2021-10-18 LAB — COMPREHENSIVE METABOLIC PANEL
ALT: 14 U/L (ref 0–44)
AST: 23 U/L (ref 15–41)
Albumin: 2.4 g/dL — ABNORMAL LOW (ref 3.5–5.0)
Alkaline Phosphatase: 245 U/L — ABNORMAL HIGH (ref 38–126)
Anion gap: 7 (ref 5–15)
BUN: 19 mg/dL (ref 6–20)
CO2: 22 mmol/L (ref 22–32)
Calcium: 8.2 mg/dL — ABNORMAL LOW (ref 8.9–10.3)
Chloride: 105 mmol/L (ref 98–111)
Creatinine, Ser: 0.61 mg/dL (ref 0.61–1.24)
GFR, Estimated: >60 mL/min (ref >60)
Glucose, Bld: 131 mg/dL — ABNORMAL HIGH (ref 70–99)
Potassium: 3 mmol/L — ABNORMAL LOW (ref 3.5–5.1)
Sodium: 134 mmol/L — ABNORMAL LOW (ref 135–145)
Total Bilirubin: 1 mg/dL (ref 0.3–1.2)
Total Protein: 4.5 g/dL — ABNORMAL LOW (ref 6.5–8.1)

## 2021-10-18 LAB — GLUCOSE, CAPILLARY
Glucose-Capillary: 141 mg/dL — ABNORMAL HIGH (ref 70–99)
Glucose-Capillary: 160 mg/dL — ABNORMAL HIGH (ref 70–99)
Glucose-Capillary: 157 mg/dL — ABNORMAL HIGH (ref 70–99)
Glucose-Capillary: 187 mg/dL — ABNORMAL HIGH (ref 70–99)

## 2021-10-18 LAB — CBC
HCT: 23.9 % — ABNORMAL LOW (ref 39.0–52.0)
Hemoglobin: 8.3 g/dL — ABNORMAL LOW (ref 13.0–17.0)
MCH: 30.3 pg (ref 26.0–34.0)
MCHC: 34.7 g/dL (ref 30.0–36.0)
MCV: 87.2 fL (ref 80.0–100.0)
Platelets: 56 10*3/uL — ABNORMAL LOW (ref 150–400)
RBC: 2.74 MIL/uL — ABNORMAL LOW (ref 4.22–5.81)
RDW: 15.9 % — ABNORMAL HIGH (ref 11.5–15.5)
WBC: 16.1 10*3/uL — ABNORMAL HIGH (ref 4.0–10.5)
nRBC: 0 % (ref 0.0–0.2)

## 2021-10-18 LAB — IMMATURE PLATELET FRACTION: Immature Platelet Fraction: 4 % (ref 1.2–8.6)

## 2021-10-18 MED ORDER — VANCOMYCIN HCL 125 MG PO CAPS
125.0000 mg | ORAL_CAPSULE | Freq: Four times a day (QID) | ORAL | Status: DC
Start: 2021-10-18 — End: 2021-10-19
  Administered 2021-10-18 – 2021-10-19 (×6): 125 mg via ORAL
  Filled 2021-10-18 (×3): qty 1
  Filled 2021-10-18: qty 4
  Filled 2021-10-18 (×7): qty 1

## 2021-10-18 MED ORDER — POTASSIUM CHLORIDE CRYS ER 20 MEQ PO TBCR
40.0000 meq | EXTENDED_RELEASE_TABLET | Freq: Two times a day (BID) | ORAL | Status: AC
  Administered 2021-10-18 (×2): 40 meq via ORAL
  Filled 2021-10-18 (×2): qty 2

## 2021-10-18 MED ORDER — MAGNESIUM SULFATE 2 GM/50ML IV SOLN
2.0000 g | Freq: Once | INTRAVENOUS | Status: AC
  Administered 2021-10-18: 2 g via INTRAVENOUS
  Filled 2021-10-18: qty 50

## 2021-10-18 MED ORDER — ALBUMIN HUMAN 5 % IV SOLN
12.5000 g | Freq: Once | INTRAVENOUS | Status: DC | PRN
  Filled 2021-10-18: qty 250

## 2021-10-18 MED ORDER — HYDROCORTISONE 10 MG PO TABS
30.0000 mg | ORAL_TABLET | Freq: Two times a day (BID) | ORAL | Status: DC
  Administered 2021-10-18 – 2021-10-19 (×2): 30 mg via ORAL
  Filled 2021-10-18 (×4): qty 3

## 2021-10-18 MED ORDER — METRONIDAZOLE 500 MG/100ML IV SOLN
500.0000 mg | Freq: Three times a day (TID) | INTRAVENOUS | Status: DC
  Administered 2021-10-18 – 2021-10-19 (×4): 500 mg via INTRAVENOUS
  Filled 2021-10-18 (×4): qty 100

## 2021-10-18 NOTE — Progress Notes (Signed)
Patient transported to [place] suite per stretcher, prepped and draped in sterile manner. Access obtained at RLQ , clear serosanguinous drainage obtained. 4 L obtained.  ?

## 2021-10-18 NOTE — Evaluation (Signed)
Physical Therapy Evaluation ?Patient Details ?Name: Henry Johns ?MRN: [id number] ?DOB: Aug 19, 1960 ?Today's Date: 10/18/2021 ? ?History of Present Illness ? Henry Johns is a 61 y.o. 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  Patient follows with Dr. Delton Coombes on 4/3 when he had most recent chemotherapy (Monday through Wednesday).  He complained of diarrhea which started on Monday and continued with last bowel movement being this afternoon around 4 PM.  Patient complained of generalized weakness and fatigue, but denies fever, chills, chest pain, shortness of breath, headache.  Patient was admitted from 3/3 to 3/5 due to hypotension and was discharged with midodrine and hydrocortisone.  Spironolactone was held, but patient continued on Lasix as needed. ?  ?Clinical Impression ? Patient functioning near baseline for functional mobility and gait demonstrating fair/good return for ambulating in room without loss of balance, limited mostly due to fatigue and tolerated sitting up in chair after therapy with his spouse present in room.  Patient will benefit from continued skilled physical therapy in hospital and recommended venue below to increase strength, balance, endurance for safe ADLs and gait.  ? ?   ?   ? ?Recommendations for follow up therapy are one component of a multi-disciplinary discharge planning process, led by the attending physician.  Recommendations may be updated based on patient status, additional functional criteria and insurance authorization. ? ?Follow Up Recommendations Home health PT ? ?  ?Assistance Recommended at Discharge Set up Supervision/Assistance  ?Patient can return home with the following ? A little help with walking and/or transfers;A little help with bathing/dressing/bathroom;Help with stairs or  ramp for entrance;Assistance with cooking/housework ? ?  ?Equipment Recommendations None recommended by PT  ?Recommendations for Other Services ?    ?  ?Functional Status Assessment Patient has had a recent decline in their functional status and demonstrates the ability to make significant improvements in function in a reasonable and predictable amount of time.  ? ?  ?Precautions / Restrictions Precautions ?Precautions: Fall ?Restrictions ?Weight Bearing Restrictions: No  ? ?  ? ?Mobility ? Bed Mobility ?Overal bed mobility: Modified Independent ?  ?  ?  ?  ?  ?  ?  ?  ? ?Transfers ?Overall transfer level: Needs assistance ?Equipment used: Rolling walker (2 wheels) ?Transfers: Sit to/from Stand, Bed to chair/wheelchair/BSC ?Sit to Stand: Min guard ?  ?Step pivot transfers: Min guard ?  ?  ?  ?General transfer comment: slightly labored movement ?  ? ?Ambulation/Gait ?Ambulation/Gait assistance: Supervision, Min guard ?Gait Distance (Feet): 40 Feet ?Assistive device: Rolling walker (2 wheels) ?Gait Pattern/deviations: Decreased step length - right, Decreased step length - left, Decreased stride length ?Gait velocity: decreased ?  ?  ?General Gait Details: slightly labored cadence without loss of balance, limited mostly due to fatigue ? ?Stairs ?  ?  ?  ?  ?  ? ?Wheelchair Mobility ?  ? ?Modified Rankin (Stroke Patients Only) ?  ? ?  ? ?Balance Overall balance assessment: Needs assistance ?Sitting-balance support: Feet supported, No upper extremity supported ?Sitting balance-Leahy Scale: Good ?Sitting balance - Comments: seated at EOB ?  ?Standing balance support: During functional activity, Bilateral upper extremity supported ?Standing balance-Leahy Scale: Fair ?Standing balance comment: fair/good using RW ?  ?  ?  ?  ?  ?  ?  ?  ?  ?  ?  ?   ? ? ? ?  Pertinent Vitals/Pain Pain Assessment ?Pain Assessment: No/denies pain  ? ? ?Home Living Family/patient expects to be discharged to:: Private residence ?Living  Arrangements: Spouse/significant other ?Available Help at Discharge: Family ?Type of Home: House ?Home Access: Stairs to enter ?Entrance Stairs-Rails: None ?Entrance Stairs-Number of Steps: 3 ?  ?Home Layout: One level ?Home Equipment: Rollator (4 wheels);Wheelchair - manual;BSC/3in1 ?   ?  ?Prior Function Prior Level of Function : Needs assist ?  ?  ?  ?Physical Assist : Mobility (physical);ADLs (physical) ?Mobility (physical): Bed mobility;Transfers;Gait;Stairs ?  ?Mobility Comments: household ambulator using Rollator ?ADLs Comments: assisted by family ?  ? ? ?Hand Dominance  ?   ? ?  ?Extremity/Trunk Assessment  ? Upper Extremity Assessment ?Upper Extremity Assessment: Overall WFL for tasks assessed ?  ? ?Lower Extremity Assessment ?Lower Extremity Assessment: Generalized weakness ?  ? ?Cervical / Trunk Assessment ?Cervical / Trunk Assessment: Normal  ?Communication  ? Communication: No difficulties  ?Cognition Arousal/Alertness: Awake/alert ?Behavior During Therapy: [facility] for tasks assessed/performed ?Overall Cognitive Status: Within Functional Limits for tasks assessed ?  ?  ?  ?  ?  ?  ?  ?  ?  ?  ?  ?  ?  ?  ?  ?  ?  ?  ?  ? ?  ?General Comments   ? ?  ?Exercises    ? ?Assessment/Plan  ?  ?PT Assessment Patient needs continued PT services  ?PT Problem List Decreased strength;Decreased activity tolerance;Decreased balance;Decreased mobility ? ?   ?  ?PT Treatment Interventions DME instruction;Gait training;Stair training;Functional mobility training;Therapeutic activities;Therapeutic exercise;Patient/family education;Balance training   ? ?PT Goals (Current goals can be found in the Care Plan section)  ?Acute Rehab PT Goals ?Patient Stated Goal: return home with family to assist ?PT Goal Formulation: With patient ?Time For Goal Achievement: 10/25/21 ?Potential to Achieve Goals: Good ? ?  ?Frequency Min 3X/week ?  ? ? ?Co-evaluation   ?  ?  ?  ?  ? ? ?  ?AM-PAC PT "6 Clicks" Mobility  ?Outcome Measure Help needed  turning from your back to your side while in a flat bed without using bedrails?: None ?Help needed moving from lying on your back to sitting on the side of a flat bed without using bedrails?: None ?Help needed moving to and from a bed to a chair (including a wheelchair)?: A Little ?Help needed standing up from a chair using your arms (e.g., wheelchair or bedside chair)?: A Little ?Help needed to walk in hospital room?: A Little ?Help needed climbing 3-5 steps with a railing? : A Little ?6 Click Score: 20 ? ?  ?End of Session   ?Activity Tolerance: Patient tolerated treatment well;Patient limited by fatigue ?Patient left: in chair;with call bell/phone within reach ?Nurse Communication: Mobility status ?PT Visit Diagnosis: Unsteadiness on feet (R26.81);Other abnormalities of gait and mobility (R26.89);Muscle weakness (generalized) (M62.81) ?  ? ?Time: [phone] ?PT Time Calculation (min) (ACUTE ONLY): 23 min ? ? ?Charges:   PT Evaluation ?$PT Eval Moderate Complexity: 1 Mod ?PT Treatments ?$Therapeutic Activity: 23-37 mins ?  ?   ? ? ?2:01 PM, 10/18/21 ?Lonell Grandchild, MPT ?Physical Therapist with [place] ?[facility] ?[phone] office ?[phone] mobile phone ? ?

## 2021-10-18 NOTE — TOC Benefit Eligibility Note (Signed)
Patient Advocate Encounter ? ?Insurance verification completed.   ? ?The patient is currently admitted and upon discharge could be taking vancomycin 125 mg capusles. ? ?The current 10 day co-pay is, $4.00.  ? ?The patient is insured through [place] [employer]  ? ? ? ?Lyndel Safe, CPhT ?Pharmacy Patient Advocate Specialist ?[facility] Patient Advocate Team ?Direct Number: [phone]  Fax: [phone] ? ? ? ? ? ?  ?

## 2021-10-18 NOTE — Procedures (Signed)
? ?  US guided RLQ paracentesis ? ?5.7 liters yellow fluid ?No labs per MD ? ?Tolerated well ? ?EBL: less than 1 cc ?

## 2021-10-18 NOTE — Plan of Care (Signed)

## 2021-10-18 NOTE — Progress Notes (Signed)
?Progress Note ? ?Patient: Henry Johns JQB:[id number] DOB: 30-Sep-1960  ?DOA: 10/15/2021  DOS: 10/18/2021  ?  ?Brief hospital course: ?HPI: Henry Johns is a 61 y.o. 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  Patient follows with Dr. Delton Coombes on 4/3 when he had most recent chemotherapy (Monday through Wednesday).  He complained of diarrhea which started on Monday and continued with last bowel movement being this afternoon around 4 PM.  Patient complained of generalized weakness and fatigue, but denies fever, chills, chest pain, shortness of breath, headache. ?Patient was admitted from 3/3 to 3/5 due to hypotension and was discharged with midodrine and hydrocortisone.  Spironolactone was held, but patient continued on Lasix as needed. ?  ?  ?ED Course:  ?In the emergency department, he was noted to be hypotensive with BP of 88/66, other vital signs were within normal range.  Work-up in the ED showed leukocytosis, normocytic anemia, thrombocytopenia, BMP showed hyponatremia, hypokalemia, hyperglycemia, ALP 325. ?CT abdomen and pelvis with contrast showed hepatic metastatic disease which has increased in the interval from prior exam. Diffuse wall thickening in the sigmoid colon is noted although no discrete mass lesion is noted.  Large volume ascites with evidence of a small umbilical hernia.  Mild increase in pulmonary nodules consistent with progressive metastatic disease ?Patient was empirically treated with IV ciprofloxacin, Flagyl.  IV hydration was provided, potassium was replenished. ?Dr. Delton Coombes (hematology/oncology) was consulted and recommended admitting patient to medicine.  CT and GI stool panel pending. ? ?Assessment and Plan: ?Generalized weakness possibly secondary to acute diarrhea and electrolyte abnormalities:   ?- PT eval now that he's more stable. ? ?Diarrhea due to C. diff colitis: C. difficile assay inconclusive (+Ag, no toxin detected) Toxigenic C. diff PCR was positive. No evidence for fulminant disease at this time, but immunosuppression puts him at risk. ?- Start po vancomycin '125mg'$  QID x10 days ?- Continue IV flagyl, up to q8h for now. Can DC ceftriaxone.  ?  ?Hypotension: Multifactorial, but inclusive of deconditioning, adrenal insufficiency, decreased EABV with diarrhea. Had admission for this previously and chronically runs low. ?- Hold lasix, will taper hydrocortisone to '30mg'$  BID po now.  ?- Continue midodrine '10mg'$  TID ? ?Large volume ascites, recurrent, malignant: Getting therapeutic paracenteses as outpatient weekly.  ?- IR has been consulted for possible paracentesis, BP has improved. Albumin ordered. ? ?Hypokalemia: Will plan to continue regular supplementation, especially if restarting lasix. Suspect a degree of ongoing GI losses. ? ?Hyponatremia/dehydration: Improved ?- Stable off IVF  ? ?Hypoalbuminemia, moderate protein calorie malnutrition.  ?- Protein supplementation.  ? ?Leukocytosis possibly reactive vs infectious: Monitor with treatment. Difficult to evaluate in setting of steroids. ? ?Elevated alkaline phosphatase due to liver mets, downtrending. TBili wnl. ? ?Anemia of malignancy:  ?- Hgb down to 8.3g/dl, not precipitously declining.  Will monitor CBC in AM or prn bleeding.  ? ?Thrombocytopenia: ?due to chemotherapy. No bleeding. ?- Immature fraction is normal, though this is inappropriate in setting of declining count, so argues for chemotherapy-related marrow suppression.  ? ?Metastatic colon cancer to the liver and lungs: Recent imaging suggesting progressive enlargement.  ?- Patient follows with Dr. Delton Coombes who is aware of hospitalization, last visit was on 4/3. ? ?Essential hypertension: Hold antiHTN meds for now. Hopefully can restart lasix soon. If BP stable post-para will do so  today. ? ?T2DM  with hyperglycemia: Chronically well-controlled with (?spurious) HbA1c of 4.4%.  ?- Continue SSI, plan to taper this as steroids are tapered. ? ?Subjective: He continues to eat better, had about 3 stools over past 24 hours, still liquid. Abdomen is uncomfortable/tight but not in pain that he needs medication for. No fever/chills. Denies N/V, chest pain, dyspnea, and bleeding of any kind.  ? ?Objective: ?Vitals:  ? 10/18/21 0150 10/18/21 0557 10/18/21 0848 10/18/21 0858  ?BP: '95/64 98/73 98/73 '$ 104/70  ?Pulse: 75 76 94 90  ?Resp: '19 20  16  '$ ?Temp: 98.1 ?F (36.7 ?C) 98 ?F (36.7 ?C)    ?TempSrc: Oral     ?SpO2: 100% 100% 99% 99%  ?Weight:      ?Height:      ? ?Gen: Chronically ill-appearing 61 y.o. male in no distress ?Pulm: Nonlabored breathing room air, diminished at bases/decreased excursion. Clear. ?CV: Regular rate and rhythm. No murmur, rub, or gallop. No JVD, stable 2+ dependent edema. ?GI: Abdomen taut, minimally tender/more uncomfortable not worse with palpation. ?Ext: Warm, no deformities ?Skin: No new rashes, lesions or ulcers on visualized skin. Scattered ecchymoses are stable. ?Neuro: Alert and oriented. No focal neurological deficits. ?Psych: Judgement and insight appear fair. Mood euthymic & affect congruent. Behavior is appropriate.   ? ?Data Personally reviewed: ? ?CBC: ?Recent Labs  ?Lab 10/15/21 ?1740 10/16/21 ?0432 10/17/21 ?0434 10/18/21 ?0441  ?WBC 24.9* 20.8* 24.3* 16.1*  ?NEUTROABS 21.0*  --   --   --   ?HGB 10.3* 9.0* 8.6* 8.3*  ?HCT 29.8* 25.9* 24.8* 23.9*  ?MCV 89.0 88.7 87.6 87.2  ?PLT 98* 86* 77* 56*  ? ?Basic Metabolic Panel: ?Recent Labs  ?Lab 10/15/21 ?1740 10/16/21 ?0432 10/17/21 ?0434 10/18/21 ?0441  ?NA 130* 134* 133* 134*  ?K 3.0* 3.3* 3.2* 3.0*  ?CL 99 107 103 105  ?CO2 21* '22 22 22  '$ ?GLUCOSE 168* 107* 286* 131*  ?BUN 29* 21* 18 19  ?CREATININE 0.83 0.63 0.69 0.61  ?CALCIUM 8.2* 7.7* 7.9* 8.2*  ?MG  --  1.8  --   --   ?PHOS  --  2.3*  --   --   ? ?GFR: ?Estimated  Creatinine Clearance: 95 mL/min (by C-G formula based on SCr of 0.61 mg/dL). ?Liver Function Tests: ?Recent Labs  ?Lab 10/15/21 ?1740 10/16/21 ?0432 10/18/21 ?0441  ?AST 33 29 23  ?ALT '19 16 14  '$ ?ALKPHOS 325* 270* 245*  ?BILITOT 1.4* 1.1 1.0  ?PROT 5.1* 4.3* 4.5*  ?ALBUMIN 2.8* 2.4* 2.4*  ? ?CBG: ?Recent Labs  ?Lab 10/17/21 ?0741 10/17/21 ?1135 10/17/21 ?1631 10/17/21 ?2116 10/18/21 ?5885  ?GLUCAP 249* 250* 193* 170* 141*  ? ?Lipid Profile: ?No results for input(s): CHOL, HDL, LDLCALC, TRIG, CHOLHDL, LDLDIRECT in the last 72 hours. ?Thyroid Function Tests: ?No results for input(s): TSH, T4TOTAL, FREET4, T3FREE, THYROIDAB in the last 72 hours. ?Anemia Panel: ?No results for input(s): VITAMINB12, FOLATE, FERRITIN, TIBC, IRON, RETICCTPCT in the last 72 hours. ?Urine analysis: ?   ?Component Value Date/Time  ? COLORURINE YELLOW 10/09/2021 0810  ? APPEARANCEUR CLEAR 10/09/2021 0810  ? LABSPEC 1.016 10/09/2021 0810  ? PHURINE 5.0 10/09/2021 0810  ? GLUCOSEU NEGATIVE 10/09/2021 0810  ? [place] NEGATIVE 10/09/2021 0810  ? [place] NEGATIVE 10/09/2021 0810  ? [place] NEGATIVE 10/09/2021 0810  ? [place] NEGATIVE 10/09/2021 0810  ? NITRITE NEGATIVE 10/09/2021 0810  ? LEUKOCYTESUR NEGATIVE 10/09/2021 0810  ? ?Recent Results (from the past 240 hour(s))  ?C Difficile Quick Screen w PCR reflex  Status: Abnormal  ? Collection Time: 10/15/21 10:28 PM  ? Specimen: STOOL  ?Result Value Ref Range Status  ? C Diff antigen POSITIVE (A) NEGATIVE Final  ? C Diff toxin NEGATIVE NEGATIVE Final  ? C Diff interpretation Results are indeterminate. See PCR results.  Final  ?  Comment: RESULT CALLED TO, READ BACK BY AND VERIFIED WITH: ?E. FIERKE AT 0003 ON 04.11.23 BY ADGER J  ?Performed at [facility], [address], [place], [place] [phone] ?  ?Gastrointestinal Panel by PCR , Stool     Status: None  ? Collection Time: 10/15/21 10:28 PM  ? Specimen: Stool  ?Result Value Ref Range Status  ? Campylobacter species NOT DETECTED NOT  DETECTED Final  ? Plesimonas shigelloides NOT DETECTED NOT DETECTED Final  ? Salmonella species NOT DETECTED NOT DETECTED Final  ? Yersinia enterocolitica NOT DETECTED NOT DETECTED Final  ? Vibrio species NOT DETECTE

## 2021-10-18 NOTE — Plan of Care (Signed)
?  Problem: Acute Rehab PT Goals(only PT should resolve) ?Goal: Pt Will Go Supine/Side To Sit ?Outcome: Progressing ?Flowsheets (Taken 10/18/2021 1402) ?Pt will go Supine/Side to Sit: ? Independently ? with modified independence ?Goal: Patient Will Transfer Sit To/From Stand ?Outcome: Progressing ?Flowsheets (Taken 10/18/2021 1402) ?Patient will transfer sit to/from stand: ? with modified independence ? with supervision ?Goal: Pt Will Transfer Bed To Chair/Chair To Bed ?Outcome: Progressing ?Flowsheets (Taken 10/18/2021 1402) ?Pt will Transfer Bed to Chair/Chair to Bed: ? with modified independence ? with supervision ?Goal: Pt Will Ambulate ?Outcome: Progressing ?Flowsheets (Taken 10/18/2021 1402) ?Pt will Ambulate: ? 50 feet ? with modified independence ? with rolling walker ? with supervision ?  ?2:03 PM, 10/18/21 ?Lonell Grandchild, MPT ?Physical Therapist with [place] ?[facility] ?[phone] office ?[phone] mobile phone ? ?

## 2021-10-19 ENCOUNTER — Encounter (HOSPITAL_COMMUNITY): Payer: Self-pay | Admitting: Physical Therapy

## 2021-10-19 ENCOUNTER — Inpatient Hospital Stay (HOSPITAL_COMMUNITY): Payer: Medicaid Other

## 2021-10-19 ENCOUNTER — Other Ambulatory Visit (HOSPITAL_COMMUNITY): Payer: Self-pay

## 2021-10-19 DIAGNOSIS — E440 Moderate protein-calorie malnutrition: Secondary | ICD-10-CM

## 2021-10-19 LAB — BASIC METABOLIC PANEL
Anion gap: 5 (ref 5–15)
BUN: 28 mg/dL — ABNORMAL HIGH (ref 6–20)
CO2: 21 mmol/L — ABNORMAL LOW (ref 22–32)
Calcium: 8.1 mg/dL — ABNORMAL LOW (ref 8.9–10.3)
Chloride: 106 mmol/L (ref 98–111)
Creatinine, Ser: 0.8 mg/dL (ref 0.61–1.24)
GFR, Estimated: >60 mL/min (ref >60)
Glucose, Bld: 123 mg/dL — ABNORMAL HIGH (ref 70–99)
Potassium: 4 mmol/L (ref 3.5–5.1)
Sodium: 132 mmol/L — ABNORMAL LOW (ref 135–145)

## 2021-10-19 LAB — CBC
HCT: 29.1 % — ABNORMAL LOW (ref 39.0–52.0)
Hemoglobin: 9.8 g/dL — ABNORMAL LOW (ref 13.0–17.0)
MCH: 30 pg (ref 26.0–34.0)
MCHC: 33.7 g/dL (ref 30.0–36.0)
MCV: 89 fL (ref 80.0–100.0)
Platelets: 88 10*3/uL — ABNORMAL LOW (ref 150–400)
RBC: 3.27 MIL/uL — ABNORMAL LOW (ref 4.22–5.81)
RDW: 16.4 % — ABNORMAL HIGH (ref 11.5–15.5)
WBC: 15.9 10*3/uL — ABNORMAL HIGH (ref 4.0–10.5)
nRBC: 1.1 % — ABNORMAL HIGH (ref 0.0–0.2)

## 2021-10-19 LAB — BLOOD GAS, VENOUS
Acid-base deficit: 5.6 mmol/L — ABNORMAL HIGH (ref 0.0–2.0)
Bicarbonate: 19.3 mmol/L — ABNORMAL LOW (ref 20.0–28.0)
Drawn by: 6381
FIO2: 21 %
O2 Saturation: 69.6 %
Patient temperature: 36.7
pCO2, Ven: 35 mmHg — ABNORMAL LOW (ref 44–60)
pH, Ven: 7.35 (ref 7.25–7.43)
pO2, Ven: 40 mmHg (ref 32–45)

## 2021-10-19 LAB — GLUCOSE, CAPILLARY
Glucose-Capillary: 116 mg/dL — ABNORMAL HIGH (ref 70–99)
Glucose-Capillary: 120 mg/dL — ABNORMAL HIGH (ref 70–99)
Glucose-Capillary: 134 mg/dL — ABNORMAL HIGH (ref 70–99)

## 2021-10-19 LAB — PROTIME-INR
INR: 1.8 — ABNORMAL HIGH (ref 0.8–1.2)
Prothrombin Time: 20.3 seconds — ABNORMAL HIGH (ref 11.4–15.2)

## 2021-10-19 LAB — MAGNESIUM: Magnesium: 2.2 mg/dL (ref 1.7–2.4)

## 2021-10-19 MED ORDER — HYDROCORTISONE 10 MG PO TABS
ORAL_TABLET | ORAL | 0 refills | Status: AC
Start: 2021-10-19 — End: ?

## 2021-10-19 MED ORDER — HEPARIN SOD (PORK) LOCK FLUSH 100 UNIT/ML IV SOLN
500.0000 [IU] | Freq: Once | INTRAVENOUS | Status: DC
  Filled 2021-10-19: qty 5

## 2021-10-19 MED ORDER — GADOBUTROL 1 MMOL/ML IV SOLN
7.0000 mL | Freq: Once | INTRAVENOUS | Status: AC | PRN
  Administered 2021-10-19: 7 mL via INTRAVENOUS

## 2021-10-19 MED ORDER — VANCOMYCIN HCL 125 MG PO CAPS: 125.0000 mg | ORAL_CAPSULE | Freq: Four times a day (QID) | ORAL | 0 refills | Status: AC

## 2021-10-19 MED ORDER — MIDODRINE HCL 10 MG PO TABS: 10.0000 mg | ORAL_TABLET | Freq: Three times a day (TID) | ORAL | 0 refills | Status: AC

## 2021-10-19 MED ORDER — DIPHENOXYLATE-ATROPINE 2.5-0.025 MG PO TABS: 2.0000 | ORAL_TABLET | Freq: Four times a day (QID) | ORAL | 0 refills | Status: DC | PRN

## 2021-10-19 NOTE — Discharge Summary (Signed)
?Physician Discharge Summary ?  ?Patient: Henry Johns MRN: [id number] DOB: [birth year]  ?Admit date:     10/15/2021  ?Discharge date: 10/19/21  ?Discharge Physician: Patrecia Pour  ? ?PCP: [facility]  ? ?Recommendations at discharge:  ?Follow up with Dr. Delton Coombes as scheduled on Monday. Recommend recheck CMP, CBC and blood pressure.  ?Discharged on vancomycin '125mg'$  QID to complete 10 day course (will be $4) to treat C. diff colitis. ?Continue midodrine, but take it TID.  ?Continue solucortef, but take it 3 times daily instead of only twice ('20mg'$ , '10mg'$ , '10mg'$ ) ? ?Discharge Diagnoses: ?Principal Problem: ?  Acute diarrhea ?Active Problems: ?  Colon cancer metastasized to liver [facility]) ?  Diabetes mellitus with neuropathy ([place]) ?  Hypokalemia ?  Hypotension ?  Generalized weakness ?  Normocytic anemia ?  Hypoalbuminemia ?  Elevated alpha fetoprotein ?  Ascites ?  Malnutrition of moderate degree ? ?Resolved Problems: ?  * No resolved hospital problems. * ? ?Hospital Course: ?HPI: Henry Johns is a 61 y.o. 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  Patient follows with Dr. Delton Coombes on 4/3 when he had most recent chemotherapy (Monday through Wednesday).  He complained of diarrhea which started on Monday and continued with last bowel movement being this afternoon around 4 PM.  Patient complained of generalized weakness and fatigue, but denies fever, chills, chest pain, shortness of breath, headache. ?Patient was admitted from 3/3 to 3/5 due to hypotension and was discharged with midodrine and hydrocortisone.  Spironolactone was held, but patient continued on Lasix as needed. ?  ?  ?ED Course:  ?In the emergency department, he was noted to be hypotensive with BP of 88/66, other vital signs were within normal  range.  Work-up in the ED showed leukocytosis, normocytic anemia, thrombocytopenia, BMP showed hyponatremia, hypokalemia, hyperglycemia, ALP 325. ?CT abdomen and pelvis with contrast showed hepatic metastatic disease which has increased in the interval from prior exam. Diffuse wall thickening in the sigmoid colon is noted although no discrete mass lesion is noted.  Large volume ascites with evidence of a small umbilical hernia.  Mild increase in pulmonary nodules consistent with progressive metastatic disease ?Patient was empirically treated with IV ciprofloxacin, Flagyl.  IV hydration was provided, potassium was replenished. ?Dr. Delton Coombes (hematology/oncology) was consulted and recommended admitting patient to medicine.  CT and GI stool panel pending. ? ?Hospital Course: Found to have toxigenic C. diff, antibiotics were narrowed to IV flagyl and po vanc with improvement in symptoms. Blood pressure returned to his baseline soft BP. He had an episode of orthostatic hypotension (code stroke w/negative neuroimaging, symptoms all resolved, no dysrhythmias on telemetry) but feel much better since that time and is requesting discharge today. Paracentesis of 5.7L on 4/12 may have contributed to hypotensive episode, though he was given albumin. Again, he feels well and would like to go home ASAP. His oral intake has continued to improve and is matching and exceeding his reported GI losses ? ?Assessment and Plan: ?Generalized weakness possibly secondary to acute diarrhea and electrolyte abnormalities: Improved. Will get home health PT. Urged to have assistance getting up regardless.  ?  ?Diarrhea due to C. diff colitis: C. difficile assay inconclusive (+Ag, no toxin detected) Toxigenic C. diff PCR was positive. No evidence for fulminant disease at this time, but immunosuppression puts  him at risk. ?- Continue po vancomycin '125mg'$  QID x10 days ?- Continued IV flagyl while admitted.  ?  ?Hypotension: Multifactorial, but  inclusive of deconditioning, adrenal insufficiency, decreased EABV with diarrhea. Had admission for this previously and chronically runs low. ?- Can restart prn lasix ?- Taper hydrocortisone from full stress dose to near home dose, add '10mg'$  noon dose ('20mg'$ , '10mg'$ , '10mg'$ ) ?- Continue midodrine '10mg'$  TID ?  ?Large volume ascites, recurrent, malignant: Getting therapeutic paracenteses as outpatient weekly. Had 5.7L out 4/12 with symptomatic improvement.  ?- Continue outpatient par's with albumin support.  ? ?Hypokalemia: Will plan to continue regular supplementation ? ?Hyponatremia/dehydration: Improved ?- Stable off IVF  ? ?Hypoalbuminemia, moderate protein calorie malnutrition.  ?- Protein supplementation.  ? ?Leukocytosis possibly reactive vs infectious: Improving with treatment and steroid tapering. Afebrile. ? ?Elevated alkaline phosphatase due to liver mets, downtrending. TBili wnl. ? ?Anemia of malignancy:  ?- Hgbstabilizing at 9.8g/dl ?- Recheck CBC at follow up is recommended ? ?Thrombocytopenia: No bleeding. Suspected to be due to chemotherapy.  ?- Immature fraction is normal, though this is inappropriate in setting of declining count, so argues for chemotherapy-related marrow suppression.  ? ?Metastatic colon cancer to the liver and lungs: Recent imaging suggesting progressive enlargement. MRI brain showed no metastatic disease. ?- Patient follows with Dr. Delton Coombes who is aware of hospitalization, last visit was on 4/3. Has follow up 4/17. ? ?Essential hypertension: Make lasix prn. with some ongoing hypotension. ? ?T2DM with hyperglycemia: Chronically well-controlled with (?spurious) HbA1c of 4.4%.  ?- Glycemic control significantly improved once high dose steroids tapered. Should not require insulin at discharge, high risk of hypoglycemia. ? ?Consultants: Oncology, Dr. Delton Coombes by phone. IR. ?Procedures performed: Paracentesis 5.7L 4/12  ?Disposition: Home ?Diet recommendation:  ?Discharge Diet Orders  (From admission, onward)  ? ?  Start     Ordered  ? 10/19/21 0000  Diet general       ? 10/19/21 1335  ? ?  ?  ? ?  ? ?Regular diet ?DISCHARGE MEDICATION: ?Allergies as of 10/19/2021   ?No Known Allergies ?  ? ?  ?Medication List  ?  ? ?STOP taking these medications   ? ?LORazepam 0.5 MG tablet ?Commonly known as: ATIVAN ?  ? ?  ? ?TAKE these medications   ? ?acetaminophen 500 MG tablet ?Commonly known as: TYLENOL ?Take 1,000 mg by mouth every 6 (six) hours as needed for moderate pain. ?  ?diphenoxylate-atropine 2.5-0.025 MG tablet ?Commonly known as: LOMOTIL ?Take 2 tablets by mouth 4 (four) times daily as needed for diarrhea or loose stools. Do not take with Imodium. Do not take until cleared by physician ?What changed: additional instructions ?  ?fluorouracil CALGB 57322 2,400 mg/m2 in sodium chloride 0.9 % 150 mL ?Inject 2,400 mg/m2 into the vein over 48 hr. ?  ?furosemide 40 MG tablet ?Commonly known as: LASIX ?Take 1 tablet (40 mg total) by mouth daily as needed. ?What changed: reasons to take this ?  ?hydrocortisone 10 MG tablet ?Commonly known as: CORTEF ?20 mg (2 tablets) in the morning, 10 mg (1 tablet) at noon, and '10mg'$  (1 tablet) in the evening ?What changed: additional instructions ?  ?insulin NPH-regular Human (70-30) 100 UNIT/ML injection ?Inject 10-12 Units into the skin 2 (two) times daily as needed (BS). ?  ?LEUCOVORIN CALCIUM IV ?Inject 400 mg/m2 into the vein every 14 (fourteen) days. ?  ?Lidocaine (Anorectal) 5 % Crea ?Apply 1 application topically 2 (two) times daily as needed. ?  ?lidocaine 5 %  ointment ?Commonly known as: XYLOCAINE ?Apply 1 application topically in the morning and at bedtime. Apply to both hands twice a day ?  ?lidocaine-prilocaine cream ?Commonly known as: EMLA ?Apply to affected area once ?  ?magnesium oxide 400 (240 Mg) MG tablet ?Commonly known as: MAG-OX ?Take 1 tablet (400 mg total) by mouth 2 (two) times daily. ?  ?midodrine 10 MG tablet ?Commonly known as:  PROAMATINE ?Take 1 tablet (10 mg total) by mouth 3 (three) times daily with meals. ?What changed:  ?when to take this ?reasons to take this ?  ?morphine 15 MG tablet ?Commonly known as: MSIR ?Take 1 tablet (15 mg total) b

## 2021-10-19 NOTE — TOC Progression Note (Addendum)
Transition of Care (TOC) - Progression Note  ? ? ?Patient Details  ?Name: Henry Johns ?MRN: [id number] ?Date of Birth: 05-19-1961 ? ?Transition of Care (TOC) CM/SW Contact  ?Boneta Lucks, RN ?Phone Number: ?10/19/2021, 9:03 AM ? ?Clinical Narrative:   [facility] agreed to take patient for HHPT, MD aware to order. Added to AVS.  ? ?Addendum: Discharging home today, Vaughan Basta updated. ? ? ?Expected Discharge Plan: Home/Self Care ?Barriers to Discharge: No Home Care Agency will accept this patient, Continued Medical Work up ? ?Expected Discharge Plan and Services ?Expected Discharge Plan: Home/Self Care ?  ?  ? Living arrangements for the past 2 months: Single Family Home ?                ?  ? ?Readmission Risk Interventions ? ?  10/16/2021  ?  3:14 PM  ?Readmission Risk Prevention Plan  ?Transportation Screening Complete  ?Medication Review Press photographer) Complete  ?PCP or Specialist appointment within 3-5 days of discharge Not Complete  ?[place] or Home Care Consult Complete  ?SW Recovery Care/Counseling Consult Complete  ?Palliative Care Screening Not Complete  ?[place] Not Applicable  ? ? ?

## 2021-10-19 NOTE — Therapy (Unsigned)
[facility] ?[address] ?[place], [place], [address] ?Phone: [phone]   Fax:  [phone] ? ?Patient Details  ?Name: MAXEY RANSOM ?MRN: [id number] ?Date of Birth: Dec 22, 1960 ?Referring Provider:  Derek Jack  ? ?Encounter Date: 10/19/2021 ? ?PHYSICAL THERAPY DISCHARGE SUMMARY ? ?Visits from Start of Care: 26 ? ?Current functional level related to goals / functional outcomes: ?Wound size .8x0.6 cm,90% granulated  ? ?   ?Remaining deficits: ?Still not fully closed  ?  ?Education / Equipment: self care  ? ?Patient agrees to discharge. Patient goals were met. Patient is being discharged due to not returning since the last visit.  ?[name], PT CLT ?[phone]  ?10/19/2021, 3:00 PM ? ?[facility] ?[address] ?[place], [place], [address] ?Phone: [phone]   Fax:  [phone] ?

## 2021-10-19 NOTE — Progress Notes (Signed)
Code Stroke  ? ?6am overhead page code stroke ?Beeper   None ?Call time.  8:15am I called them ?Exam start  818 ?Exam finish  823 ?Soc   823 ?Epic   823 ?Rad   824 ?

## 2021-10-19 NOTE — Progress Notes (Signed)
PT Cancellation Note ? ?Patient Details ?Name: Henry Johns ?MRN: [id number] ?DOB: Nov 07, 1960 ? ? ?Cancelled Treatment:    Reason Eval/Treat Not Completed: Patient declined, no reason specified;Fatigue/lethargy limiting ability to participate ?Pt declined PT today, stated he had a rough night and did not want to get up.  Pt left in bed with call bell within reach and family members present.  Pt c/o begin cold, given a warm blanket for comfort and educated increased movements will be helpful to warm up. ? ?Ihor Austin, LPTA/CLT; CBIS ?[phone] ?Aldona Lento ?10/19/2021, 9:46 AM ?

## 2021-10-19 NOTE — Progress Notes (Signed)
Dr. Corlis Leak at bedside via telestroke cart at [facility] ?

## 2021-10-19 NOTE — Consult Note (Addendum)
[facility] ?[facility] Tele[facility]urology Consult Services ? ? ?Patient Name:   Tryson, Lumley ?Date of Birth:   1960/10/24 ?Identification Number:   MRN - [id number] ?Date of Service:   10/19/2021 06:20:22 ? ?Diagnosis: ?      M62.81 - Generalized Muscle Weakness ? ?Impression: ?     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 BP around that time was 84/58. Exam shows chronic left peripheral vision deficit due to cataract and new onset LLE drift and hit bed. He was able to ambulate with assistance and sit up in without assisitance, family confirmed that is baseline. Not thrombolytics candidate due to active GI malignancy, platelet 88. CTH neg for acute finding. Exam is not suggestive of LVO. Suspect presentaiton is due to hypotension vs CVA. Recommend MR brain w/wo to rule out stroke or intracranial mets. Will hold off on aspirin due to currently thrombocytopenic. ?  ? ?Our recommendations are outlined below. ? ?Recommendations: ? ?      Stroke/Telemetry Floor ?      Neuro Checks ?      Bedside Swallow Eval ?      DVT Prophylaxis ?      IV Fluids, Normal Saline ?      Head of Bed 30 Degrees ?      Euglycemia and Avoid Hyperthermia (PRN Acetaminophen) ?      Antihypertensives PRN if Blood pressure is greater than 220/120 or there is a concern for End organ damage/contraindications for permissive HTN. If blood pressure is greater than 220/120 give labetalol PO or IV or Vasotec IV with a goal of 15% reduction in BP during the first 24 hours. ? ?Per facility request will defer further work up, management, and referrals to inpatient service, inclusive of inpatient neurology consult. ? ?Sign Out: ?      Discussed with Rapid Response Team ? ? ? ?------------------------------------------------------------------------------ ? ?[facility]: ?exam is not suggestive of LVO with LLE weakness but no other focal  deficit ? ? ?Metrics: ?Last Known Well: 10/19/2021 05:30:00 ?[facility] Notification Time: 10/19/2021 06:20:22 ?Stamp Time: 10/19/2021 06:20:22 ?Initial Response Time: 10/19/2021 06:22:06 ?Symptoms: generalized weakness . ?NIHSS Start Assessment Time: 10/19/2021 06:23:53 ?Patient is not a candidate for Thrombolytic. 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 ? ?CT head showed no acute hemorrhage or acute core infarct. ?I personally Reviewed the CT Head and it Showed no hemorrhage ? ?ED Physician not notified of diagnostic impression and management plan because spoke with primary nurse ? ? ? ?------------------------------------------------------------------------------ ? ?History of Present Illness: ?Patient is a 61 year old Male. ? ?Inpatient stroke alert was called for symptoms of generalized weakness . ?Patient was initially admitted on 4/9 for diarrhea and generalized weakness, noted to be hypotensive. He is currently receiving treatment for C. diff colitis, receiving chemo for colon cancer with mets, and paracenteses. Last seen normal was around 5:30AM when patient went to the restroom. He came back to bed, tried to sit up but slumped down, "shook all over" and has generalized weakness. No report of focal weakness or deficit. He has cataract of his left eye causing poor vision. ? ? ? 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 ?Medications: ? ?No Anticoagulant use  ?No Antiplatelet use ?Reviewed EMR for current  medications ? ?Allergies:  ?Reviewed,NKDA ? ?Social History: ?Smoking: No ? ?Family History: ? ?There is no family history of premature cerebrovascular disease pertinent to this consultation ? ?ROS : ?14 Points Review of Systems was performed and was negative except  mentioned in HPI. ? ?Past Surgical History: ?There Is No Surgical History Contributory To Today?s Visit ? ? ? ?Examination: ?BP(84/58), Pulse(95), ?1A: Level of Consciousness - Alert; keenly responsive + 0 ?1B: Ask Month and Age - Both Questions Right + 0 ?1C: Blink Eyes & Squeeze Hands - Performs Both Tasks + 0 ?2: Test Horizontal Extraocular Movements - Normal + 0 ?3: Test Visual Fields - Complete Hemianopia + 2 ?4: Test Facial Palsy (Use Grimace if Obtunded) - Normal symmetry + 0 ?5A: Test Left Arm Motor Drift - No Drift for 10 Seconds + 0 ?5B: Test Right Arm Motor Drift - No Drift for 10 Seconds + 0 ?6A: Test Left Leg Motor Drift - Drift, hits bed + 2 ?6B: Test Right Leg Motor Drift - No Drift for 5 Seconds + 0 ?7: Test Limb Ataxia (FNF/Heel-Shin) - No Ataxia + 0 ?8: Test Sensation - Normal; No sensory loss + 0 ?9: Test Language/Aphasia - Normal; No aphasia + 0 ?10: Test Dysarthria - Normal + 0 ?11: Test Extinction/Inattention - No abnormality + 0 ? ?NIHSS Score: 4 ? ?NIHSS Free Text : some difficulty with reading due to cataract. ? Unable to see left peripheral vision due to cataract, chronic per patient ? ?Pre-Morbid Modified Rankin Scale: ?0 Points = No symptoms at all ? ? ?Patient/Family was informed the Neurology Consult would occur via TeleHealth consult by way of interactive audio and video telecommunications and consented to receiving care in this manner. ? ? ?Patient is being evaluated for possible acute neurologic impairment and high probability of imminent or life-threatening deterioration. I spent total of 30 minutes providing care to this patient, including time for face to face visit via telemedicine, review of medical records, imaging studies and discussion of findings with providers, the patient and/or family. ? ? ?Dr Edward Jolly ? ? ?[facility] ?[phone] ? ? ?Case [id number] ? ?

## 2021-10-19 NOTE — Progress Notes (Signed)
Code stroke activated at 0609. Staff reports patient LKW 0530 when he got up to use the restroom. Got back in bed, tried to sit up, slumped down, "shook" all over, then generally bilaterally weak. No focal deficits on their exam. MD aware of change, asked for RRT, and RRT RN called code stroke. ROS 0600 per staff.  ?

## 2021-10-19 NOTE — Progress Notes (Signed)
Nsg Discharge Note ? ?Admit Date:  10/15/2021 ?Discharge date: 10/19/2021 ?  ?[name] to be D/C'd Home per MD order.  AVS completed.  ?Patient/caregiver able to verbalize understanding. ? ?Discharge Medication: ?Allergies as of 10/19/2021   ?No Known Allergies ?  ? ?  ?Medication List  ?  ? ?STOP taking these medications   ? ?LORazepam 0.5 MG tablet ?Commonly known as: ATIVAN ?  ? ?  ? ?TAKE these medications   ? ?acetaminophen 500 MG tablet ?Commonly known as: TYLENOL ?Take 1,000 mg by mouth every 6 (six) hours as needed for moderate pain. ?  ?diphenoxylate-atropine 2.5-0.025 MG tablet ?Commonly known as: LOMOTIL ?Take 2 tablets by mouth 4 (four) times daily as needed for diarrhea or loose stools. Do not take with Imodium. Do not take until cleared by physician ?What changed: additional instructions ?  ?fluorouracil CALGB 08657 2,400 mg/m2 in sodium chloride 0.9 % 150 mL ?Inject 2,400 mg/m2 into the vein over 48 hr. ?  ?furosemide 40 MG tablet ?Commonly known as: LASIX ?Take 1 tablet (40 mg total) by mouth daily as needed. ?What changed: reasons to take this ?  ?hydrocortisone 10 MG tablet ?Commonly known as: CORTEF ?20 mg (2 tablets) in the morning, 10 mg (1 tablet) at noon, and '10mg'$  (1 tablet) in the evening ?What changed: additional instructions ?  ?insulin NPH-regular Human (70-30) 100 UNIT/ML injection ?Inject 10-12 Units into the skin 2 (two) times daily as needed (BS). ?  ?LEUCOVORIN CALCIUM IV ?Inject 400 mg/m2 into the vein every 14 (fourteen) days. ?  ?Lidocaine (Anorectal) 5 % Crea ?Apply 1 application topically 2 (two) times daily as needed. ?  ?lidocaine 5 % ointment ?Commonly known as: XYLOCAINE ?Apply 1 application topically in the morning and at bedtime. Apply to both hands twice a day ?  ?lidocaine-prilocaine cream ?Commonly known as: EMLA ?Apply to affected area once ?  ?magnesium oxide 400 (240 Mg) MG tablet ?Commonly known as: MAG-OX ?Take 1 tablet (400 mg total) by mouth 2 (two) times  daily. ?  ?midodrine 10 MG tablet ?Commonly known as: PROAMATINE ?Take 1 tablet (10 mg total) by mouth 3 (three) times daily with meals. ?What changed:  ?when to take this ?reasons to take this ?  ?morphine 15 MG tablet ?Commonly known as: MSIR ?Take 1 tablet (15 mg total) by mouth 2 (two) times daily as needed for severe pain. ?  ?ondansetron 8 MG tablet ?Commonly known as: ZOFRAN ?Take 1 tablet (8 mg total) by mouth every 8 (eight) hours as needed for nausea. ?  ?potassium chloride 10 MEQ tablet ?Commonly known as: KLOR-CON M ?Take 2 tablets (20 mEq total) by mouth daily. ?  ?silver sulfADIAZINE 1 % cream ?Commonly known as: SILVADENE ?Apply 1 application topically daily. ?  ?tamsulosin 0.4 MG Caps capsule ?Commonly known as: FLOMAX ?Take 1 capsule (0.4 mg total) by mouth daily after supper. ?  ?temazepam 30 MG capsule ?Commonly known as: RESTORIL ?TAKE 1 CAPSULE BY MOUTH AT BEDTIME AS NEEDED FOR SLEEP ?What changed: See the new instructions. ?  ?traZODone 100 MG tablet ?Commonly known as: DESYREL ?Take 1 tablet (100 mg total) by mouth at bedtime. ?  ?vancomycin 125 MG capsule ?Commonly known as: VANCOCIN ?Take 1 capsule (125 mg total) by mouth 4 (four) times daily for 9 days. ?  ? ?  ? ? ?Discharge Assessment: ?Vitals:  ? 10/19/21 0447 10/19/21 1330  ?BP: (!) 84/58 100/71  ?Pulse: 95 73  ?Resp: 18 18  ?Temp:  98 ?F (36.7 ?C) 98 ?F (36.7 ?C)  ?SpO2: 100% 100%  ? Skin clean, dry and intact without evidence of skin break down, no evidence of skin tears noted. ?IV catheter discontinued intact. Site without signs and symptoms of complications - no redness or edema noted at insertion site, patient denies c/o pain - only slight tenderness at site.  Dressing with slight pressure applied. ? ?D/c Instructions-Education: ?Discharge instructions given to patient/family with verbalized understanding. ?D/c education completed with patient/family including follow up instructions, medication list, d/c activities limitations if  indicated, with other d/c instructions as indicated by MD - patient able to verbalize understanding, all questions fully answered. ?Patient instructed to return to ED, call 911, or call MD for any changes in condition.  ?Patient escorted via [place], and D/C home via private auto. ? ?Kathie Rhodes, RN ?10/19/2021 2:32 PM  ?

## 2021-10-19 NOTE — Progress Notes (Signed)
1856 patient's brother came out in the hallway and told me to come in the room and help his brother that something wasn't right. I went in room and found patient half on the bed and half off. The brother told me he had just went to the bathroom but at that moment was not able to get back in bed. I had to call for help to get him in bed. His eyes were fixed and he would not follow my hands when I waved them in front of him. His pupils were round and appeared a 4, reacted slowly but they did react. I called a rapid at 0558 but when I tried to get him to hold his arms up he wasn't able to. He was not able to hold his legs up. He could not follow commands to smile. I decided that maybe this should be a code stroke instead of a rapid response. At this time doctor and other staff was in the room and I explained why I felt that a code stroke was appropriate for what I assessed at that present time.  ?

## 2021-10-21 LAB — CULTURE, BLOOD (ROUTINE X 2): Culture: NO GROWTH

## 2021-10-23 ENCOUNTER — Other Ambulatory Visit (HOSPITAL_COMMUNITY): Payer: Self-pay | Admitting: *Deleted

## 2021-10-23 ENCOUNTER — Inpatient Hospital Stay (HOSPITAL_COMMUNITY): Payer: Medicaid Other

## 2021-10-23 ENCOUNTER — Inpatient Hospital Stay (HOSPITAL_BASED_OUTPATIENT_CLINIC_OR_DEPARTMENT_OTHER): Payer: Medicaid Other | Admitting: Hematology

## 2021-10-23 VITALS — BP 100/74 | HR 97 | Temp 96.2°F | Resp 18 | Ht 68.0 in | Wt 149.3 lb

## 2021-10-23 DIAGNOSIS — C787 Secondary malignant neoplasm of liver and intrahepatic bile duct: Secondary | ICD-10-CM

## 2021-10-23 DIAGNOSIS — C189 Malignant neoplasm of colon, unspecified: Secondary | ICD-10-CM | POA: Diagnosis not present

## 2021-10-23 DIAGNOSIS — Z79899 Other long term (current) drug therapy: Secondary | ICD-10-CM | POA: Diagnosis not present

## 2021-10-23 DIAGNOSIS — Z5112 Encounter for antineoplastic immunotherapy: Secondary | ICD-10-CM | POA: Diagnosis present

## 2021-10-23 DIAGNOSIS — Z5189 Encounter for other specified aftercare: Secondary | ICD-10-CM | POA: Diagnosis not present

## 2021-10-23 DIAGNOSIS — R180 Malignant ascites: Secondary | ICD-10-CM

## 2021-10-23 DIAGNOSIS — C187 Malignant neoplasm of sigmoid colon: Secondary | ICD-10-CM | POA: Diagnosis present

## 2021-10-23 DIAGNOSIS — Z5111 Encounter for antineoplastic chemotherapy: Secondary | ICD-10-CM | POA: Diagnosis not present

## 2021-10-23 LAB — COMPREHENSIVE METABOLIC PANEL
ALT: 25 U/L (ref 0–44)
AST: 40 U/L (ref 15–41)
Albumin: 2.5 g/dL — ABNORMAL LOW (ref 3.5–5.0)
Alkaline Phosphatase: 321 U/L — ABNORMAL HIGH (ref 38–126)
Anion gap: 6 (ref 5–15)
BUN: 27 mg/dL — ABNORMAL HIGH (ref 6–20)
CO2: 19 mmol/L — ABNORMAL LOW (ref 22–32)
Calcium: 7.8 mg/dL — ABNORMAL LOW (ref 8.9–10.3)
Chloride: 105 mmol/L (ref 98–111)
Creatinine, Ser: 0.81 mg/dL (ref 0.61–1.24)
GFR, Estimated: >60 mL/min (ref >60)
Glucose, Bld: 244 mg/dL — ABNORMAL HIGH (ref 70–99)
Potassium: 3.7 mmol/L (ref 3.5–5.1)
Sodium: 130 mmol/L — ABNORMAL LOW (ref 135–145)
Total Bilirubin: 0.9 mg/dL (ref 0.3–1.2)
Total Protein: 4.7 g/dL — ABNORMAL LOW (ref 6.5–8.1)

## 2021-10-23 LAB — CBC WITH DIFFERENTIAL/PLATELET
Band Neutrophils: 2 %
Basophils Absolute: 0 10*3/uL (ref 0.0–0.1)
Basophils Relative: 0 %
Eosinophils Absolute: 0 10*3/uL (ref 0.0–0.5)
Eosinophils Relative: 0 %
HCT: 28.7 % — ABNORMAL LOW (ref 39.0–52.0)
Hemoglobin: 9.9 g/dL — ABNORMAL LOW (ref 13.0–17.0)
Lymphocytes Relative: 12 %
Lymphs Abs: 4.3 10*3/uL — ABNORMAL HIGH (ref 0.7–4.0)
MCH: 31.4 pg (ref 26.0–34.0)
MCHC: 34.5 g/dL (ref 30.0–36.0)
MCV: 91.1 fL (ref 80.0–100.0)
Metamyelocytes Relative: 5 %
Monocytes Absolute: 3.6 10*3/uL — ABNORMAL HIGH (ref 0.1–1.0)
Monocytes Relative: 10 %
Myelocytes: 3 %
Neutro Abs: 24.9 10*3/uL — ABNORMAL HIGH (ref 1.7–7.7)
Neutrophils Relative %: 68 %
Platelets: 89 10*3/uL — ABNORMAL LOW (ref 150–400)
RBC: 3.15 MIL/uL — ABNORMAL LOW (ref 4.22–5.81)
RDW: 20.6 % — ABNORMAL HIGH (ref 11.5–15.5)
WBC: 35.5 10*3/uL — ABNORMAL HIGH (ref 4.0–10.5)
nRBC: 1 % — ABNORMAL HIGH (ref 0.0–0.2)

## 2021-10-23 LAB — MAGNESIUM: Magnesium: 1.9 mg/dL (ref 1.7–2.4)

## 2021-10-23 MED ORDER — HEPARIN SOD (PORK) LOCK FLUSH 100 UNIT/ML IV SOLN
500.0000 [IU] | Freq: Once | INTRAVENOUS | Status: AC
  Administered 2021-10-23: 500 [IU] via INTRAVENOUS

## 2021-10-23 MED ORDER — SODIUM CHLORIDE 0.9% FLUSH
10.0000 mL | Freq: Once | INTRAVENOUS | Status: AC
  Administered 2021-10-23: 10 mL via INTRAVENOUS

## 2021-10-23 MED ORDER — TEMAZEPAM 30 MG PO CAPS: ORAL_CAPSULE | ORAL | 0 refills | Status: DC

## 2021-10-23 MED ORDER — FUROSEMIDE 40 MG PO TABS: 40.0000 mg | ORAL_TABLET | Freq: Every day | ORAL | 2 refills | Status: DC | PRN

## 2021-10-23 MED ORDER — MAGNESIUM OXIDE -MG SUPPLEMENT 400 (240 MG) MG PO TABS: 400.0000 mg | ORAL_TABLET | Freq: Two times a day (BID) | ORAL | 3 refills | Status: DC

## 2021-10-23 NOTE — Patient Instructions (Signed)
[place] at [facility] ?Discharge Instructions ? ? ?You were seen and examined today by Dr. Delton Coombes. ? ?He reviewed the results of your lab work.  ? ? ?Thank you for choosing [place] at [facility] to provide your oncology and hematology care.  To afford each patient quality time with our provider, please arrive at least 15 minutes before your scheduled appointment time.  ? ?If you have a lab appointment with the [place] [facility] [place] please come in thru the Main Entrance and check in at the main information desk. ? ?You need to re-schedule your appointment should you arrive 10 or more minutes late.  We strive to give you quality time with our providers, and arriving late affects you and other patients whose appointments are after yours.  Also, if you no show three or more times for appointments you may be dismissed from the clinic at the providers discretion.     ?Again, thank you for choosing [facility].  Our hope is that these requests will decrease the amount of time that you wait before being seen by our physicians.       ?_____________________________________________________________ ? ?Should you have questions after your visit to [facility], please contact our office at [phone] and follow the prompts.  Our office hours are 8:00 a.m. and 4:30 p.m. Monday - Friday.  Please note that voicemails left after 4:00 p.m. may not be returned until the following business day.  We are closed weekends and major holidays.  You do have access to a nurse 24-7, just call the main number to the clinic [phone] and do not press any options, hold on the line and a nurse will answer the phone.   ? ?For prescription refill requests, have your pharmacy contact our office and allow 72 hours.   ? ?Due to Covid, you will need to wear a mask upon entering the hospital. If you do not have a mask, a mask will be given to you at the Main Entrance upon  arrival. For doctor visits, patients may have 1 support person age 36 or older with them. For treatment visits, patients can not have anyone with them due to social distancing guidelines and our immunocompromised population.  ? ?   ?

## 2021-10-23 NOTE — Progress Notes (Signed)
? ?[place] ?[address] ?[place], [place] [address] ? ? ?CLINIC:  ?Medical Oncology/Hematology ? ?PCP:  ?[facility] [place] [id number] ?[phone] ? ? ?REASON FOR VISIT:  ?Follow-up for colon cancer metastasized to liver ? ?PRIOR THERAPY: none ? ?NGS Results: not done ? ?CURRENT THERAPY: FOLFOX q14d ? ?BRIEF ONCOLOGIC HISTORY:  ?Oncology History Overview Note  ?MMR normal ?Foundation One: low mutation burden ?  ?Metastasis to liver [facility])  ?01/04/2021 Initial Diagnosis  ? Metastasis to liver [facility]) ? ?  ?01/25/2021 -  Chemotherapy  ? Patient is on Treatment Plan : COLORECTAL FOLFOX q14d  ? ?  ?  ?Colon cancer metastasized to liver [facility])  ?12/26/2020 Imaging  ? Ct abdomen and pelvis ?1. Extensive hepatic metastatic disease and retroperitoneal adenopathy. ?2. A 7 mm right lung base nodule, most consistent with metastatic disease. ?3. No bowel obstruction. Normal appendix. ?4. Aortic Atherosclerosis (ICD10-I70.0). ?  ?12/26/2020 Imaging  ? CT chest ?1. Several small lung nodules consistent with metastatic disease. The primary malignancy remains unclear. The liver morphology suggests underlying cirrhosis, and multifocal hepatocellular carcinoma should be included in the differential diagnosis. ?2. No acute abnormality in the chest. ?  ?  ?01/04/2021 Initial Diagnosis  ? 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  ?01/10/2021 Pathology Results  ? A. LIVER, NEEDLE CORE BIOPSY:  ?-  Metastatic adenocarcinoma  ?-  See comment  ? ?COMMENT:  ? ?The neoplastic cells are positive for cytokeratin 20 and CDX2 but negative for cytokeratin 7, TTF-1, PSA and prostein.  The combined morphology and immunophenotype are consistent with metastasis from a  ?gastrointestinal primary.  Dr. Saralyn Pilar reviewed the case and agrees with the above diagnosis.   ?  ?01/13/2021 Procedure  ? Colonoscopy ?- Two 3 to 5 mm polyps in the transverse colon, removed with a cold snare. Resected and retrieved. ?-  Malignant partially obstructing tumor at 17 cm proximal to the anus. Biopsied. Tattooed. ?- The distal rectum and anal verge are normal on retroflexion view. ?  ?01/13/2021 Cancer Staging  ? Staging form: Colon and Rectum, AJCC 8th Edition ?- Clinical stage from 01/13/2021: Stage IVB (cTX, cN0, pM1b) - Signed by Heath Lark, MD on 01/13/2021 ?Stage prefix: Initial diagnosis ?Total positive nodes: 0 ? ?  ?01/13/2021 Procedure  ? Colonoscopy ?- Two 3 to 5 mm polyps in the transverse colon, removed with a cold snare. Resected and retrieved. ?- Malignant partially obstructing tumor at 17 cm proximal to the anus. Biopsied. Tattooed. ?- The distal rectum and anal verge are normal on retroflexion view. ?  ?01/13/2021 Procedure  ? EGD ?- Normal esophagus. ?- Z-line regular, 40 cm from the incisors. ?- Normal stomach. ?- Nodular mucosa in ?  ?01/13/2021 Pathology Results  ? A. DUODENUM, NODULE, BIOPSY:  ?- Peptic duodenitis.  ?- No dysplasia or malignancy.  ? ?B. COLON, SIGMOID, MASS, BIOPSY:  ?- Invasive adenocarcinoma.  ? ?C. COLON, TRANSVERSE, POLYPECTOMY:  ?- Fragment of ulcerated tissue with adenocarcinoma, see comment.  ?- Tubular adenoma (X2 fragments).  ? ?COMMENT:  ? ?B. MMR will be ordered ?  ?01/13/2021 Tumor Marker  ? Patient's tumor was tested for the following markers: CEA. ?Results of the tumor marker test revealed 883. ?  ?01/24/2021 Procedure  ? Successful right-sided port placement, with the tip of the catheter in the proximal right atrium. ?  ?Plan: Catheter ready for use. ?  ?See detailed procedure note with images in PACS. ?The patient  tolerated the procedure well without incident or complication and was returned to Recovery in stable conditi ?  ?01/25/2021 -  Chemotherapy  ? Patient is on Treatment Plan : COLORECTAL FOLFOX q14d  ? ?  ?  ?01/25/2021 Tumor Marker  ? Patient's tumor was tested for the following markers: CEA. ?Results of the tumor marker test revealed 1378. ?  ? ? ?CANCER STAGING: ? Cancer Staging  ?Colon  cancer metastasized to liver [facility]) ?Staging form: Colon and Rectum, AJCC 8th Edition ?- Clinical stage from 01/13/2021: Stage IVB (cTX, cN0, pM1b) - Signed by Heath Lark, MD on 01/13/2021 ? ? ?INTERVAL HISTORY:  ?[name], a 61 y.o. male, returns for routine follow-up and consideration for next cycle of chemotherapy. Sonya was last seen on 10/09/2021. ? ?Due for cycle #12 of FOLFOX today.  ? ?Overall, he tells me he has been feeling fair. He reports his diarrhea is still present but has improved since being hospitalized on 10/15/21. He continues to take vancomycin for C-diff. He reports 3 loose BM daily. He rates his appetite 40% from baseline. He is taking Lasix once daily.  ? ?Overall, he is not ready for next cycle of chemo today.  ? ? ?REVIEW OF SYSTEMS:  ?Review of Systems  ?Constitutional:  Positive for appetite change (40%) and fatigue.  ?Respiratory:  Positive for cough.   ?Cardiovascular:  Positive for leg swelling.  ?Gastrointestinal:  Positive for diarrhea, nausea and vomiting.  ?Neurological:  Positive for numbness.  ?Psychiatric/Behavioral:  Positive for depression and sleep disturbance. The patient is nervous/anxious.   ?All other systems reviewed and are negative. ? ?PAST MEDICAL/SURGICAL HISTORY:  ?Past Medical History:  ?Diagnosis Date  ? Colon cancer ([place])   ? Diabetes mellitus without complication ([place])   ? Hypertension   ? ?Past Surgical History:  ?Procedure Laterality Date  ? BIOPSY  01/13/2021  ? Procedure: BIOPSY;  Surgeon: Montez Morita, Quillian Quince, MD;  [facility];  Service: Gastroenterology;;  ? CATARACT EXTRACTION Right   ? COLONOSCOPY WITH PROPOFOL N/A 01/13/2021  ? Procedure: COLONOSCOPY WITH PROPOFOL;  Surgeon: Harvel Quale, MD;  Location: [facility];  Service: Gastroenterology;  Laterality: N/A;  11:05  ? ESOPHAGOGASTRODUODENOSCOPY (EGD) WITH PROPOFOL N/A 01/13/2021  ? Procedure: ESOPHAGOGASTRODUODENOSCOPY (EGD) WITH PROPOFOL;  Surgeon: Harvel Quale, MD;  Location: [facility];  Service: Gastroenterology;  Laterality: N/A;  ? IR IMAGING GUIDED PORT INSERTION  01/24/2021  ? LIVER BIOPSY  01/10/2021  ? U/S guided  ? POLYPECTOMY  01/13/2021  ? Procedure: POLYPECTOMY;  Surgeon: Harvel Quale, MD;  Location: [facility];  Service: Gastroenterology;;  ? ? ?SOCIAL HISTORY:  ?Social History  ? ?Socioeconomic History  ? Marital status: Married  ?  Spouse name: Not on file  ? Number of children: Not on file  ? Years of education: Not on file  ? Highest education level: Not on file  ?Occupational History  ? Not on file  ?Tobacco Use  ? Smoking status: Never  ? Smokeless tobacco: Never  ?Vaping Use  ? Vaping Use: Never used  ?Substance and Sexual Activity  ? Alcohol use: Not Currently  ? Drug use: Not Currently  ? Sexual activity: Not Currently  ?Other Topics Concern  ? Not on file  ?Social History Narrative  ? Married, no childer  ? ?Social Determinants of Health  ? ?Financial Resource Strain: High Risk  ? Difficulty of Paying Living Expenses: Very hard  ?Food Insecurity: No Food Insecurity  ?  Worried About Charity fundraiser in the Last Year: Never true  ? Ran Out of Food in the Last Year: Never true  ?Transportation Needs: No Transportation Needs  ? Lack of Transportation (Medical): No  ? Lack of Transportation (Non-Medical): No  ?Physical Activity: Inactive  ? Days of Exercise per Week: 0 days  ? Minutes of Exercise per Session: 0 min  ?Stress: Not on file  ?Social Connections: Not on file  ?Intimate Partner Violence: Not At Risk  ? Fear of Current or Ex-Partner: No  ? Emotionally Abused: No  ? Physically Abused: No  ? Sexually Abused: No  ? ? ?FAMILY HISTORY:  ?Family History  ?Problem Relation Age of Onset  ? Cancer Father 14  ?     prostate ca  ? ? ?CURRENT MEDICATIONS:  ?Current Outpatient Medications  ?Medication Sig Dispense Refill  ? acetaminophen (TYLENOL) 500 MG tablet Take 1,000 mg by mouth every 6 (six) hours as needed for  moderate pain.    ? diphenoxylate-atropine (LOMOTIL) 2.5-0.025 MG tablet Take 2 tablets by mouth 4 (four) times daily as needed for diarrhea or loose stools. Do not take with Imodium. Do not take until cleared by

## 2021-10-23 NOTE — Patient Instructions (Signed)
[place]  Discharge Instructions: ?Thank you for choosing [facility] to provide your oncology and hematology care.  ?If you have a lab appointment with the [facility], please come in thru the Main Entrance and check in at the main information desk. ? ?Wear comfortable clothing and clothing appropriate for easy access to any Portacath or PICC line.  ? ?We strive to give you quality time with your provider. You may need to reschedule your appointment if you arrive late (15 or more minutes).  Arriving late affects you and other patients whose appointments are after yours.  Also, if you miss three or more appointments without notifying the office, you may be dismissed from the clinic at the provider?s discretion.    ?  ?For prescription refill requests, have your pharmacy contact our office and allow 72 hours for refills to be completed.   ? ?Today you received the following chemotherapy and/or immunotherapy agents no treatment    ?  ?To help prevent nausea and vomiting after your treatment, we encourage you to take your nausea medication as directed. ? ?BELOW ARE SYMPTOMS THAT SHOULD BE REPORTED IMMEDIATELY: ?*FEVER GREATER THAN 100.4 F (38 ?C) OR HIGHER ?*CHILLS OR SWEATING ?*NAUSEA AND VOMITING THAT IS NOT CONTROLLED WITH YOUR NAUSEA MEDICATION ?*UNUSUAL SHORTNESS OF BREATH ?*UNUSUAL BRUISING OR BLEEDING ?*URINARY PROBLEMS (pain or burning when urinating, or frequent urination) ?*BOWEL PROBLEMS (unusual diarrhea, constipation, pain near the anus) ?TENDERNESS IN MOUTH AND THROAT WITH OR WITHOUT PRESENCE OF ULCERS (sore throat, sores in mouth, or a toothache) ?UNUSUAL RASH, SWELLING OR PAIN  ?UNUSUAL VAGINAL DISCHARGE OR ITCHING  ? ?Items with * indicate a potential emergency and should be followed up as soon as possible or go to the Emergency Department if any problems should occur. ? ?Please show the CHEMOTHERAPY ALERT CARD or IMMUNOTHERAPY ALERT CARD at check-in to the Emergency  Department and triage nurse. ? ?Should you have questions after your visit or need to cancel or reschedule your appointment, please contact [facility] [phone]  and follow the prompts.  Office hours are 8:00 a.m. to 4:30 p.m. Monday - Friday. Please note that voicemails left after 4:00 p.m. may not be returned until the following business day.  We are closed weekends and major holidays. You have access to a nurse at all times for urgent questions. Please call the main number to the clinic [phone] and follow the prompts. ? ?For any non-urgent questions, you may also contact your provider using MyChart. We now offer e-Visits for anyone 54 and older to request care online for non-urgent symptoms. For details visit [email or web address]. ?  ?Also download the MyChart app! Go to the app store, search "MyChart", open the app, select [place], and log in with your MyChart username and password. ? ?Due to Covid, a mask is required upon entering the hospital/clinic. If you do not have a mask, one will be given to you upon arrival. For doctor visits, patients may have 1 support person aged 6 or older with them. For treatment visits, patients cannot have anyone with them due to current Covid guidelines and our immunocompromised population.  ?

## 2021-10-23 NOTE — Progress Notes (Signed)
Patient presents today for MVASI/Leucovorin/ Flurorouracil infusion with 5FU pump start.  Patient recently discharged from the hospital for diarrhea and hypotension and is currently on antibiotics.  Patient is still having nausea and diarrhea. ? ?No treatment today per Dr. Delton Coombes.  Will reevaluate in 2 weeks for possible treatment. ? ?Discharge from clinic via wheelchair in stable condition.  Alert and oriented X 3.  Follow up with [facility] as scheduled.  ?

## 2021-10-24 ENCOUNTER — Other Ambulatory Visit (HOSPITAL_COMMUNITY): Payer: Self-pay | Admitting: Hematology

## 2021-10-24 LAB — CEA: CEA: 396 ng/mL — ABNORMAL HIGH (ref 0.0–4.7)

## 2021-10-25 ENCOUNTER — Encounter (HOSPITAL_COMMUNITY): Payer: Self-pay

## 2021-10-25 ENCOUNTER — Ambulatory Visit (HOSPITAL_COMMUNITY)
Admission: RE | Admit: 2021-10-25 | Discharge: 2021-10-25 | Disposition: A | Discharge status: Home / Self Care | Payer: Medicaid Other | Source: Ambulatory Visit | Attending: Hematology | Admitting: Hematology

## 2021-10-25 ENCOUNTER — Encounter (HOSPITAL_COMMUNITY): Payer: Medicaid Other

## 2021-10-25 DIAGNOSIS — R180 Malignant ascites: Secondary | ICD-10-CM | POA: Insufficient documentation

## 2021-10-25 DIAGNOSIS — C786 Secondary malignant neoplasm of retroperitoneum and peritoneum: Secondary | ICD-10-CM | POA: Diagnosis not present

## 2021-10-25 MED ORDER — SODIUM CHLORIDE FLUSH 0.9 % IV SOLN
INTRAVENOUS | Status: AC
  Administered 2021-10-25: 10 mL
  Filled 2021-10-25: qty 10

## 2021-10-25 MED ORDER — ALBUMIN HUMAN 25 % IV SOLN
50.0000 g | Freq: Once | INTRAVENOUS | Status: AC
Start: 2021-10-25 — End: 2021-10-25

## 2021-10-25 MED ORDER — ALBUMIN HUMAN 25 % IV SOLN
INTRAVENOUS | Status: AC
  Administered 2021-10-25: 50 g via INTRAVENOUS
  Filled 2021-10-25: qty 200

## 2021-10-25 NOTE — Procedures (Signed)
PROCEDURE SUMMARY: ? ?Successful US guided paracentesis from LLQ.  ?Yielded 5.0 of clear yellow fluid.  ?No immediate complications.  ?Pt tolerated well.  ? ?Specimen not sent for labs. ? ?EBL < 23m ? ?Krystol Rocco PA-C ?10/25/2021 ?12:04 PM ? ? ? ?

## 2021-10-25 NOTE — Progress Notes (Signed)
Pt tolerated left sided paracentesis procedure and 50G of IV albumin well today. PT verbalized understanding of discharge instructions and left via wheelchair with family with no acute distress noted. 4.9 Liters of clear yellow fluid removed today.  ?

## 2021-10-26 ENCOUNTER — Other Ambulatory Visit (HOSPITAL_COMMUNITY): Payer: Self-pay | Admitting: *Deleted

## 2021-10-26 MED ORDER — FUROSEMIDE 40 MG PO TABS: 40.0000 mg | ORAL_TABLET | Freq: Every day | ORAL | 2 refills | Status: DC | PRN

## 2021-10-27 ENCOUNTER — Other Ambulatory Visit (HOSPITAL_COMMUNITY): Payer: Self-pay | Admitting: Hematology

## 2021-10-27 ENCOUNTER — Encounter (HOSPITAL_COMMUNITY): Payer: Self-pay | Admitting: Hematology

## 2021-10-27 MED ORDER — TEMAZEPAM 30 MG PO CAPS: ORAL_CAPSULE | ORAL | 0 refills | Status: DC

## 2021-10-27 MED ORDER — TEMAZEPAM 30 MG PO CAPS
ORAL_CAPSULE | ORAL | 0 refills | Status: DC
Start: 2021-10-27 — End: 2021-10-30

## 2021-10-29 ENCOUNTER — Other Ambulatory Visit (HOSPITAL_COMMUNITY): Payer: Self-pay | Admitting: Hematology

## 2021-10-30 ENCOUNTER — Other Ambulatory Visit (HOSPITAL_COMMUNITY): Payer: Self-pay

## 2021-10-30 ENCOUNTER — Encounter (HOSPITAL_COMMUNITY): Payer: Self-pay | Admitting: Hematology

## 2021-10-31 ENCOUNTER — Encounter (HOSPITAL_COMMUNITY): Payer: Self-pay | Admitting: Hematology

## 2021-10-31 MED ORDER — TRAZODONE HCL 100 MG PO TABS: 100.0000 mg | ORAL_TABLET | Freq: Every day | ORAL | 2 refills | Status: DC

## 2021-11-01 ENCOUNTER — Ambulatory Visit (HOSPITAL_COMMUNITY)
Admission: RE | Admit: 2021-11-01 | Discharge: 2021-11-01 | Disposition: A | Discharge status: Home / Self Care | Payer: Medicaid Other | Source: Ambulatory Visit | Attending: Hematology | Admitting: Hematology

## 2021-11-01 ENCOUNTER — Encounter (HOSPITAL_COMMUNITY): Payer: Self-pay

## 2021-11-01 DIAGNOSIS — C786 Secondary malignant neoplasm of retroperitoneum and peritoneum: Secondary | ICD-10-CM | POA: Insufficient documentation

## 2021-11-01 DIAGNOSIS — R180 Malignant ascites: Secondary | ICD-10-CM | POA: Insufficient documentation

## 2021-11-01 MED ORDER — ALBUMIN HUMAN 25 % IV SOLN
INTRAVENOUS | Status: AC
  Administered 2021-11-01: 50 g via INTRAVENOUS
  Filled 2021-11-01: qty 200

## 2021-11-01 MED ORDER — SODIUM CHLORIDE FLUSH 0.9 % IV SOLN
INTRAVENOUS | Status: AC
  Administered 2021-11-01: 10 mL via INTRAVENOUS
  Filled 2021-11-01: qty 10

## 2021-11-01 MED ORDER — ALBUMIN HUMAN 25 % IV SOLN: 50.0000 g | Freq: Once | INTRAVENOUS | Status: AC

## 2021-11-01 NOTE — Procedures (Signed)
PROCEDURE SUMMARY: ? ?Successful US guided therapeutic paracentesis from RLQ.  ?Yielded 4.2 L of clear, yellow fluid.  ?No immediate complications.  ?Pt tolerated well.  ? ?Specimen not sent for labs. ? ?EBL < 1 mL ? ?Tyson Alias, AGNP ?11/01/2021 ?9:54 AM ? ? ?

## 2021-11-01 NOTE — Progress Notes (Signed)
PT tolerated right sided paracentesis procedure and 50G of IV albumin well today and 4.2 Liters of clear yellow ascites removed. Pt verbalized understanding of discharge instructions and left via wheelchair with wife with no acute distress noted.  ?

## 2021-11-06 ENCOUNTER — Inpatient Hospital Stay (HOSPITAL_COMMUNITY): Payer: Medicaid Other | Admitting: Hematology

## 2021-11-06 ENCOUNTER — Encounter (HOSPITAL_COMMUNITY): Payer: Self-pay

## 2021-11-06 ENCOUNTER — Inpatient Hospital Stay (HOSPITAL_COMMUNITY): Payer: Medicaid Other

## 2021-11-06 ENCOUNTER — Other Ambulatory Visit: Payer: Self-pay

## 2021-11-06 ENCOUNTER — Emergency Department (HOSPITAL_COMMUNITY): Payer: Medicaid Other

## 2021-11-06 ENCOUNTER — Inpatient Hospital Stay (HOSPITAL_COMMUNITY)
Admission: EM | Admit: 2021-11-06 | Discharge: 2021-11-09 | DRG: 435 | Disposition: A | Discharge status: Hospice | Payer: Medicaid Other | Attending: Family Medicine | Admitting: Family Medicine

## 2021-11-06 ENCOUNTER — Inpatient Hospital Stay (HOSPITAL_COMMUNITY): Payer: Medicaid Other | Admitting: Dietician

## 2021-11-06 DIAGNOSIS — R000 Tachycardia, unspecified: Secondary | ICD-10-CM | POA: Diagnosis present

## 2021-11-06 DIAGNOSIS — Z794 Long term (current) use of insulin: Secondary | ICD-10-CM | POA: Diagnosis not present

## 2021-11-06 DIAGNOSIS — R627 Adult failure to thrive: Secondary | ICD-10-CM | POA: Diagnosis present

## 2021-11-06 DIAGNOSIS — I959 Hypotension, unspecified: Secondary | ICD-10-CM | POA: Diagnosis not present

## 2021-11-06 DIAGNOSIS — W19XXXA Unspecified fall, initial encounter: Secondary | ICD-10-CM | POA: Diagnosis present

## 2021-11-06 DIAGNOSIS — E43 Unspecified severe protein-calorie malnutrition: Secondary | ICD-10-CM | POA: Diagnosis present

## 2021-11-06 DIAGNOSIS — Z85038 Personal history of other malignant neoplasm of large intestine: Secondary | ICD-10-CM

## 2021-11-06 DIAGNOSIS — T451X5A Adverse effect of antineoplastic and immunosuppressive drugs, initial encounter: Secondary | ICD-10-CM | POA: Diagnosis present

## 2021-11-06 DIAGNOSIS — Z8042 Family history of malignant neoplasm of prostate: Secondary | ICD-10-CM | POA: Diagnosis not present

## 2021-11-06 DIAGNOSIS — I10 Essential (primary) hypertension: Secondary | ICD-10-CM | POA: Diagnosis present

## 2021-11-06 DIAGNOSIS — R180 Malignant ascites: Secondary | ICD-10-CM | POA: Diagnosis present

## 2021-11-06 DIAGNOSIS — R531 Weakness: Secondary | ICD-10-CM

## 2021-11-06 DIAGNOSIS — R64 Cachexia: Secondary | ICD-10-CM | POA: Diagnosis present

## 2021-11-06 DIAGNOSIS — E8809 Other disorders of plasma-protein metabolism, not elsewhere classified: Secondary | ICD-10-CM | POA: Diagnosis present

## 2021-11-06 DIAGNOSIS — D696 Thrombocytopenia, unspecified: Secondary | ICD-10-CM | POA: Diagnosis present

## 2021-11-06 DIAGNOSIS — Z6822 Body mass index (BMI) 22.0-22.9, adult: Secondary | ICD-10-CM | POA: Diagnosis not present

## 2021-11-06 DIAGNOSIS — Y929 Unspecified place or not applicable: Secondary | ICD-10-CM

## 2021-11-06 DIAGNOSIS — E1140 Type 2 diabetes mellitus with diabetic neuropathy, unspecified: Secondary | ICD-10-CM | POA: Diagnosis present

## 2021-11-06 DIAGNOSIS — R54 Age-related physical debility: Secondary | ICD-10-CM | POA: Diagnosis present

## 2021-11-06 DIAGNOSIS — D72829 Elevated white blood cell count, unspecified: Secondary | ICD-10-CM | POA: Diagnosis present

## 2021-11-06 DIAGNOSIS — C787 Secondary malignant neoplasm of liver and intrahepatic bile duct: Secondary | ICD-10-CM | POA: Diagnosis present

## 2021-11-06 DIAGNOSIS — Z515 Encounter for palliative care: Secondary | ICD-10-CM | POA: Diagnosis not present

## 2021-11-06 DIAGNOSIS — A0472 Enterocolitis due to Clostridium difficile, not specified as recurrent: Secondary | ICD-10-CM | POA: Diagnosis present

## 2021-11-06 DIAGNOSIS — D630 Anemia in neoplastic disease: Secondary | ICD-10-CM | POA: Diagnosis present

## 2021-11-06 DIAGNOSIS — K529 Noninfective gastroenteritis and colitis, unspecified: Secondary | ICD-10-CM | POA: Diagnosis not present

## 2021-11-06 DIAGNOSIS — Z20822 Contact with and (suspected) exposure to covid-19: Secondary | ICD-10-CM | POA: Diagnosis present

## 2021-11-06 DIAGNOSIS — Z66 Do not resuscitate: Secondary | ICD-10-CM | POA: Diagnosis present

## 2021-11-06 DIAGNOSIS — C189 Malignant neoplasm of colon, unspecified: Secondary | ICD-10-CM | POA: Diagnosis present

## 2021-11-06 DIAGNOSIS — Z9841 Cataract extraction status, right eye: Secondary | ICD-10-CM | POA: Diagnosis not present

## 2021-11-06 DIAGNOSIS — C801 Malignant (primary) neoplasm, unspecified: Secondary | ICD-10-CM | POA: Diagnosis not present

## 2021-11-06 DIAGNOSIS — G8929 Other chronic pain: Secondary | ICD-10-CM | POA: Diagnosis present

## 2021-11-06 DIAGNOSIS — Y92009 Unspecified place in unspecified non-institutional (private) residence as the place of occurrence of the external cause: Secondary | ICD-10-CM

## 2021-11-06 DIAGNOSIS — E785 Hyperlipidemia, unspecified: Secondary | ICD-10-CM | POA: Diagnosis present

## 2021-11-06 DIAGNOSIS — Z79899 Other long term (current) drug therapy: Secondary | ICD-10-CM

## 2021-11-06 DIAGNOSIS — Z7189 Other specified counseling: Secondary | ICD-10-CM | POA: Diagnosis not present

## 2021-11-06 DIAGNOSIS — G893 Neoplasm related pain (acute) (chronic): Secondary | ICD-10-CM | POA: Diagnosis present

## 2021-11-06 DIAGNOSIS — R188 Other ascites: Secondary | ICD-10-CM | POA: Diagnosis present

## 2021-11-06 DIAGNOSIS — R296 Repeated falls: Secondary | ICD-10-CM | POA: Diagnosis present

## 2021-11-06 LAB — COMPREHENSIVE METABOLIC PANEL
ALT: 34 U/L (ref 0–44)
AST: 53 U/L — ABNORMAL HIGH (ref 15–41)
Albumin: 2.7 g/dL — ABNORMAL LOW (ref 3.5–5.0)
Alkaline Phosphatase: 315 U/L — ABNORMAL HIGH (ref 38–126)
Anion gap: 6 (ref 5–15)
BUN: 32 mg/dL — ABNORMAL HIGH (ref 6–20)
CO2: 22 mmol/L (ref 22–32)
Calcium: 7.9 mg/dL — ABNORMAL LOW (ref 8.9–10.3)
Chloride: 105 mmol/L (ref 98–111)
Creatinine, Ser: 0.7 mg/dL (ref 0.61–1.24)
GFR, Estimated: >60 mL/min (ref >60)
Glucose, Bld: 166 mg/dL — ABNORMAL HIGH (ref 70–99)
Potassium: 4.1 mmol/L (ref 3.5–5.1)
Sodium: 133 mmol/L — ABNORMAL LOW (ref 135–145)
Total Bilirubin: 1.1 mg/dL (ref 0.3–1.2)
Total Protein: 5.3 g/dL — ABNORMAL LOW (ref 6.5–8.1)

## 2021-11-06 LAB — GLUCOSE, CAPILLARY
Glucose-Capillary: 169 mg/dL — ABNORMAL HIGH (ref 70–99)
Glucose-Capillary: 138 mg/dL — ABNORMAL HIGH (ref 70–99)

## 2021-11-06 LAB — LACTIC ACID, PLASMA: Lactic Acid, Venous: 0.5 mmol/L (ref 0.5–1.9)

## 2021-11-06 LAB — CBC WITH DIFFERENTIAL/PLATELET
Abs Immature Granulocytes: 0.19 10*3/uL — ABNORMAL HIGH (ref 0.00–0.07)
Basophils Absolute: 0 10*3/uL (ref 0.0–0.1)
Basophils Relative: 0 %
Eosinophils Absolute: 0.1 10*3/uL (ref 0.0–0.5)
Eosinophils Relative: 0 %
HCT: 29.1 % — ABNORMAL LOW (ref 39.0–52.0)
Hemoglobin: 9.8 g/dL — ABNORMAL LOW (ref 13.0–17.0)
Immature Granulocytes: 1 %
Lymphocytes Relative: 6 %
Lymphs Abs: 1.4 10*3/uL (ref 0.7–4.0)
MCH: 31.7 pg (ref 26.0–34.0)
MCHC: 33.7 g/dL (ref 30.0–36.0)
MCV: 94.2 fL (ref 80.0–100.0)
Monocytes Absolute: 1.4 10*3/uL — ABNORMAL HIGH (ref 0.1–1.0)
Monocytes Relative: 6 %
Neutro Abs: 19.4 10*3/uL — ABNORMAL HIGH (ref 1.7–7.7)
Neutrophils Relative %: 87 %
Platelets: 102 10*3/uL — ABNORMAL LOW (ref 150–400)
RBC: 3.09 MIL/uL — ABNORMAL LOW (ref 4.22–5.81)
RDW: 20.8 % — ABNORMAL HIGH (ref 11.5–15.5)
WBC: 22.5 10*3/uL — ABNORMAL HIGH (ref 4.0–10.5)
nRBC: 0 % (ref 0.0–0.2)

## 2021-11-06 LAB — RESP PANEL BY RT-PCR (FLU A&B, COVID) ARPGX2
Influenza A by PCR: NEGATIVE
Influenza B by PCR: NEGATIVE
SARS Coronavirus 2 by RT PCR: NEGATIVE

## 2021-11-06 LAB — APTT: aPTT: 30 seconds (ref 24–36)

## 2021-11-06 LAB — PROTIME-INR
INR: 1.3 — ABNORMAL HIGH (ref 0.8–1.2)
Prothrombin Time: 15.7 seconds — ABNORMAL HIGH (ref 11.4–15.2)

## 2021-11-06 MED ORDER — OXYCODONE HCL 5 MG PO TABS: 5.0000 mg | ORAL_TABLET | Freq: Four times a day (QID) | ORAL | Status: DC | PRN

## 2021-11-06 MED ORDER — HYDROCORTISONE 10 MG PO TABS
10.0000 mg | ORAL_TABLET | Freq: Every day | ORAL | Status: DC
  Administered 2021-11-06 – 2021-11-08 (×2): 10 mg via ORAL
  Filled 2021-11-06 (×5): qty 1

## 2021-11-06 MED ORDER — HYDROMORPHONE HCL 1 MG/ML IJ SOLN
0.5000 mg | INTRAMUSCULAR | Status: DC | PRN
  Filled 2021-11-06: qty 1

## 2021-11-06 MED ORDER — LACTATED RINGERS IV SOLN
INTRAVENOUS | Status: DC
  Administered 2021-11-06: 100 mL via INTRAVENOUS

## 2021-11-06 MED ORDER — MIDODRINE HCL 5 MG PO TABS
10.0000 mg | ORAL_TABLET | Freq: Three times a day (TID) | ORAL | Status: DC
  Administered 2021-11-06 – 2021-11-09 (×6): 10 mg via ORAL
  Filled 2021-11-06 (×6): qty 2

## 2021-11-06 MED ORDER — HYDROCORTISONE 10 MG PO TABS
20.0000 mg | ORAL_TABLET | Freq: Every day | ORAL | Status: DC
  Administered 2021-11-07 – 2021-11-09 (×3): 20 mg via ORAL
  Filled 2021-11-06 (×2): qty 1
  Filled 2021-11-06 (×2): qty 2

## 2021-11-06 MED ORDER — HYDROCORTISONE 10 MG PO TABS
10.0000 mg | ORAL_TABLET | Freq: Every day | ORAL | Status: DC
  Administered 2021-11-06 – 2021-11-08 (×2): 10 mg via ORAL
  Filled 2021-11-06 (×4): qty 1

## 2021-11-06 MED ORDER — INSULIN ASPART 100 UNIT/ML IJ SOLN
0.0000 [IU] | Freq: Three times a day (TID) | INTRAMUSCULAR | Status: DC
  Administered 2021-11-06: 2 [IU] via SUBCUTANEOUS

## 2021-11-06 MED ORDER — IBUPROFEN 400 MG PO TABS: 400.0000 mg | ORAL_TABLET | Freq: Four times a day (QID) | ORAL | Status: DC | PRN

## 2021-11-06 MED ORDER — SODIUM CHLORIDE 0.9 % IV BOLUS
2000.0000 mL | Freq: Once | INTRAVENOUS | Status: AC
  Administered 2021-11-06: 2000 mL via INTRAVENOUS

## 2021-11-06 MED ORDER — ONDANSETRON HCL 4 MG/2ML IJ SOLN: 4.0000 mg | Freq: Four times a day (QID) | INTRAMUSCULAR | Status: DC | PRN

## 2021-11-06 MED ORDER — ONDANSETRON HCL 4 MG PO TABS: 4.0000 mg | ORAL_TABLET | Freq: Four times a day (QID) | ORAL | Status: DC | PRN

## 2021-11-06 MED ORDER — TRAZODONE HCL 50 MG PO TABS
100.0000 mg | ORAL_TABLET | Freq: Every day | ORAL | Status: DC
  Administered 2021-11-06 – 2021-11-08 (×2): 100 mg via ORAL
  Filled 2021-11-06 (×2): qty 2

## 2021-11-06 MED ORDER — TEMAZEPAM 7.5 MG PO CAPS: 15.0000 mg | ORAL_CAPSULE | Freq: Every evening | ORAL | Status: DC | PRN

## 2021-11-06 MED ORDER — DIPHENOXYLATE-ATROPINE 2.5-0.025 MG PO TABS
1.0000 | ORAL_TABLET | Freq: Four times a day (QID) | ORAL | Status: DC | PRN
  Administered 2021-11-06 – 2021-11-08 (×2): 1 via ORAL
  Filled 2021-11-06 (×2): qty 1

## 2021-11-06 MED ORDER — FIDAXOMICIN 200 MG PO TABS
200.0000 mg | ORAL_TABLET | Freq: Two times a day (BID) | ORAL | Status: DC
  Administered 2021-11-06 – 2021-11-08 (×4): 200 mg via ORAL
  Filled 2021-11-06 (×9): qty 1

## 2021-11-06 MED ORDER — TAMSULOSIN HCL 0.4 MG PO CAPS
0.4000 mg | ORAL_CAPSULE | Freq: Every day | ORAL | Status: DC
  Administered 2021-11-06 – 2021-11-08 (×2): 0.4 mg via ORAL
  Filled 2021-11-06 (×2): qty 1

## 2021-11-06 MED ORDER — PANTOPRAZOLE SODIUM 40 MG IV SOLR
40.0000 mg | Freq: Every day | INTRAVENOUS | Status: DC
  Administered 2021-11-06 – 2021-11-07 (×2): 40 mg via INTRAVENOUS
  Filled 2021-11-06 (×2): qty 10

## 2021-11-06 MED ORDER — METRONIDAZOLE 500 MG/100ML IV SOLN
500.0000 mg | Freq: Once | INTRAVENOUS | Status: AC
  Administered 2021-11-06: 500 mg via INTRAVENOUS
  Filled 2021-11-06: qty 100

## 2021-11-06 MED ORDER — HYDROCORTISONE 10 MG PO TABS: 20.0000 mg | ORAL_TABLET | ORAL | Status: DC

## 2021-11-06 MED ORDER — SODIUM CHLORIDE 0.9 % IV SOLN
2.0000 g | Freq: Once | INTRAVENOUS | Status: AC
  Administered 2021-11-06: 2 g via INTRAVENOUS
  Filled 2021-11-06: qty 20

## 2021-11-06 MED ORDER — METRONIDAZOLE 500 MG/100ML IV SOLN: 500.0000 mg | Freq: Two times a day (BID) | INTRAVENOUS | Status: DC

## 2021-11-06 NOTE — Assessment & Plan Note (Addendum)
--   very poor prognosis at this time ?-- full comfort measures  ?

## 2021-11-06 NOTE — Assessment & Plan Note (Signed)
--   holding statin medication at this time due to poor oral intake and patient is severely emaciated and is dying from metastatic cancer ? ?

## 2021-11-06 NOTE — Assessment & Plan Note (Signed)
--   palliative care requested to assist with end of life pain management  ?

## 2021-11-06 NOTE — Assessment & Plan Note (Addendum)
--   holding all heparins, no evidence of active bleeding at this time ?-- he is now transitioning to full comfort measures ?

## 2021-11-06 NOTE — ED Provider Notes (Signed)
?[facility] ?Provider Note ? ? ?CSN: [id number] ?Arrival date & time: 11/06/21  1201 ? ?  ? ?History ? ?Chief Complaint  ?Patient presents with  ? Weakness  ? ? ?Henry Johns is a 61 y.o. male. ? ?Patient has metastatic colon CA.  He has been having diarrhea for the last couple days and has fallen several times and feels for a week ? ?The history is provided by the patient and medical records.  ?Weakness ?Severity:  Moderate ?Onset quality:  Sudden ?Timing:  Constant ?Progression:  Worsening ?Chronicity:  Recurrent ?Context: not alcohol use   ?Relieved by:  Nothing ?Worsened by:  Nothing ?Ineffective treatments:  None tried ?Associated symptoms: no abdominal pain, no chest pain, no cough, no diarrhea, no frequency, no headaches and no seizures   ? ?  ? ?Home Medications ?   ? ?Allergies    ?Patient has no known allergies.   ? ?Review of Systems   ?Review of Systems  ?Constitutional:  Negative for appetite change and fatigue.  ?HENT:  Negative for congestion, ear discharge and sinus pressure.   ?Eyes:  Negative for discharge.  ?Respiratory:  Negative for cough.   ?Cardiovascular:  Negative for chest pain.  ?Gastrointestinal:  Negative for abdominal pain and diarrhea.  ?Genitourinary:  Negative for frequency and hematuria.  ?Musculoskeletal:  Negative for back pain.  ?Skin:  Negative for rash.  ?Neurological:  Positive for weakness. Negative for seizures and headaches.  ?Psychiatric/Behavioral:  Negative for hallucinations.   ? ?Physical Exam ?Updated Vital Signs ?BP 91/61   Pulse (!) 113   Temp 98.5 ?F (36.9 ?C) (Oral)   Resp 14   Ht '5\' 8"'$  (1.727 m)   Wt 65.8 kg   SpO2 99%   BMI 22.05 kg/m?  ?Physical Exam ?Vitals and nursing note reviewed.  ?Constitutional:   ?   Appearance: He is well-developed.  ?HENT:  ?   Head: Normocephalic.  ?   Right Ear: Ear canal normal.  ?Eyes:  ?   General: No scleral icterus. ?   Conjunctiva/sclera: Conjunctivae normal.  ?Neck:  ?   Thyroid: No thyromegaly.   ?Cardiovascular:  ?   Rate and Rhythm: Normal rate and regular rhythm.  ?   Heart sounds: No murmur heard. ?  No friction rub. No gallop.  ?Pulmonary:  ?   Breath sounds: No stridor. No wheezing or rales.  ?Chest:  ?   Chest wall: No tenderness.  ?Abdominal:  ?   General: There is no distension.  ?   Tenderness: There is no abdominal tenderness. There is no rebound.  ?Musculoskeletal:     ?   General: Normal range of motion.  ?   Cervical back: Neck supple.  ?Lymphadenopathy:  ?   Cervical: No cervical adenopathy.  ?Skin: ?   Findings: No erythema or rash.  ?Neurological:  ?   Mental Status: He is alert and oriented to person, place, and time.  ?   Motor: No abnormal muscle tone.  ?   Coordination: Coordination normal.  ?Psychiatric:     ?   Behavior: Behavior normal.  ? ? ?ED Results / Procedures / Treatments   ?Labs ?(all labs ordered are listed, but only abnormal results are displayed) ?Labs Reviewed  ?COMPREHENSIVE METABOLIC PANEL - Abnormal; Notable for the following components:  ?    Result Value  ? Sodium 133 (*)   ? Glucose, Bld 166 (*)   ? BUN 32 (*)   ? Calcium 7.9 (*)   ?  Total Protein 5.3 (*)   ? Albumin 2.7 (*)   ? AST 53 (*)   ? Alkaline Phosphatase 315 (*)   ? All other components within normal limits  ?CBC WITH DIFFERENTIAL/PLATELET - Abnormal; Notable for the following components:  ? WBC 22.5 (*)   ? RBC 3.09 (*)   ? Hemoglobin 9.8 (*)   ? HCT 29.1 (*)   ? RDW 20.8 (*)   ? Platelets 102 (*)   ? Neutro Abs 19.4 (*)   ? Monocytes Absolute 1.4 (*)   ? Abs Immature Granulocytes 0.19 (*)   ? All other components within normal limits  ?PROTIME-INR - Abnormal; Notable for the following components:  ? Prothrombin Time 15.7 (*)   ? INR 1.3 (*)   ? All other components within normal limits  ?RESP PANEL BY RT-PCR (FLU A&B, COVID) [id number]  ?CULTURE, BLOOD (ROUTINE X 2)  ?CULTURE, BLOOD (ROUTINE X 2)  ?LACTIC ACID, PLASMA  ?APTT  ?URINALYSIS, ROUTINE W REFLEX MICROSCOPIC  ? ? ?EKG ?None ? ?Radiology ?DG Chest  Port 1 View ? ?Result Date: 11/06/2021 ?CLINICAL DATA:  Generalized weakness.  Colon cancer EXAM: PORTABLE CHEST 1 VIEW COMPARISON:  09/09/2021 FINDINGS: Right-sided chest port remains in place. Heart size is normal. Slightly low lung volumes. No focal airspace consolidation, pleural effusion, or pneumothorax. No acute bony findings. IMPRESSION: No active disease. Electronically Signed   By: Davina Poke D.O.   On: 11/06/2021 13:25   ? ?Procedures ?Procedures  ? ? ?Medications Ordered in ED ?Medications  ?metroNIDAZOLE (FLAGYL) IVPB 500 mg (500 mg Intravenous New Bag/Given 11/06/21 1335)  ?cefTRIAXone (ROCEPHIN) 2 g in sodium chloride 0.9 % 100 mL IVPB (0 g Intravenous Stopped 11/06/21 1338)  ?sodium chloride 0.9 % bolus 2,000 mL (2,000 mLs Intravenous New Bag/Given 11/06/21 1244)  ? ? ?ED Course/ Medical Decision Making/ A&P ? CRITICAL CARE ?Performed by: Milton Ferguson ?Total critical care time: 40 minutes ?Critical care time was exclusive of separately billable procedures and treating other patients. ?Critical care was necessary to treat or prevent imminent or life-threatening deterioration. ?Critical care was time spent personally by me on the following activities: development of treatment plan with patient and/or surrogate as well as nursing, discussions with consultants, evaluation of patient's response to treatment, examination of patient, obtaining history from patient or surrogate, ordering and performing treatments and interventions, ordering and review of laboratory studies, ordering and review of radiographic studies, pulse oximetry and re-evaluation of patient's condition. ? ?                        ?Medical Decision Making ?Amount and/or Complexity of Data Reviewed ?Labs: ordered. ?Radiology: ordered. ?ECG/medicine tests: ordered. ? ?Risk ?Prescription drug management. ?Decision regarding hospitalization. ? ?This patient presents to the ED for concern of fatigue, this involves an extensive number of  treatment options, and is a complaint that carries with it a high risk of complications and morbidity.  The differential diagnosis includes sepsis, worsening ? ? ?Co morbidities that complicate the patient evaluation ? ?Colon cancer ? ? ?Additional history obtained: ? ?Additional history obtained from patient ?External records from outside source obtained and reviewed including hospital record ? ? ?Lab Tests: ? ?I Ordered, and personally interpreted labs.  The pertinent results include: CBC and chemistries.  White blood count elevated 22.5 hemoglobin low at 9.8 protein and albumin are low and AST and alkaline phos elevated ? ? ?Imaging Studies ordered: ? ?I ordered imaging studies including  chest x-ray ?I independently visualized and interpreted imaging which showed unremarkable ?I agree with the radiologist interpretation ? ? ?Cardiac Monitoring: / EKG: ? ?The patient was maintained on a cardiac monitor.  I personally viewed and interpreted the cardiac monitored which showed an underlying rhythm of: Normal sinus rhythm ? ? ?Consultations Obtained: ? ?I requested consultation with the hospitalist and oncology,  and discussed lab and imaging findings as well as pertinent plan - they recommend: Admission ? ? ?Problem List / ED Course / Critical interventions / Medication management ? ?Weakness and possible sepsis, colon cancer ?I ordered medication including broad-spectrum antibiotics and normal saline ?Reevaluation of the patient after these medicines showed that the patient stayed the same ?I have reviewed the patients home medicines and have made adjustments as needed ? ? ?Social Determinants of Health: ? ?None ? ? ?Test / Admission - Considered: ? ?CT abdomen ? ?Patient with diarrhea hypotension and possible sepsis.  Along with colon cancer that is metastatic.  He will be admitted to the hospitalist treated with fluids and antibiotics with oncology consult ? ?Final Clinical Impression(s) / ED Diagnoses ?Final  diagnoses:  ?Hypotensive episode  ?Metastatic cancer to liver [facility])  ? ? ?Rx / DC Orders ?ED Discharge Orders   ? ? None  ? ?  ? ? ?  ?Milton Ferguson, MD ?11/07/21 1715 ? ?

## 2021-11-06 NOTE — Assessment & Plan Note (Addendum)
--   secondary to refractory c diff infection ?-- transition to full comfort care  ?

## 2021-11-06 NOTE — Assessment & Plan Note (Signed)
--   prognosis very poor ?-- pt is too deconditioned to continue chemotherapy ?-- palliative medicine consultation requested for goals of care  ?

## 2021-11-06 NOTE — Assessment & Plan Note (Addendum)
--   secondary to active end stages of metastatic cancer and recalcitrant c diff infection ?-- improving with supportive treatments ?-- transition to full comfort care   ?

## 2021-11-06 NOTE — Assessment & Plan Note (Addendum)
--   full comfort care started 5/2   ?

## 2021-11-06 NOTE — Assessment & Plan Note (Signed)
--   pt at end stages of colon cancer  ?-- prognosis guarded  ?-- possible hospital death if not transferred to hospice ?-- spoke with family at length today ?-- spoke with brother who feels that patient expressed that he would not want to be artificially resuscitated or coded if no chance for meaningful recovery or improvement ?--DNR order placed ?--palliative medicine consultation requested  ?

## 2021-11-06 NOTE — Assessment & Plan Note (Signed)
--   secondary to failure to thrive, chemotherapy and metastatic colon cancer  ?

## 2021-11-06 NOTE — ED Triage Notes (Signed)
Patient from home with complaints of generalized weakness with multiple falls recently. Noted with several bruises and wounds to upper and lower extremities. Patient diagnosed with stage 4 colon cancer and was to discuss treatment options today. Will not speak will only shake head. Denies pain.  ?

## 2021-11-06 NOTE — Assessment & Plan Note (Signed)
--   pt had a paracentesis last Wednesday  ?

## 2021-11-06 NOTE — H&P (Signed)
?History and Physical  ?[place] ? ?[name] IRW:[id number] DOB: Jun 01, 1961 DOA: 11/06/2021 ? ?PCP: [facility]  ?Patient coming from: Home  ?Level of care: Stepdown ? ?I have personally briefly reviewed patient's old medical records in [place] ? ?Chief Complaint: severe weakness and diarrhea ? ?HPI: Henry Johns is a 61 y.o. 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  Patient follows with Dr. Delton Coombes on 4/3 when he had most recent chemotherapy (Monday through Wednesday).  He had just recently been discharged to complete a 10 day course of oral vancomycin however diarrhea returned after completing antibiotics.  Patient was admitted from 3/3 to 3/5 due to hypotension and was discharged with midodrine and hydrocortisone.  Spironolactone was held, but patient continued on Lasix as needed.  Family reports that for past couple of days he has had increase weakness, confusion, falling at home and was so weak that EMS had to transport to hospital today because he could not get up. He lives with his brother and his siblings have been caring for him but it has become very difficult to manage at home due to increasing skilled nursing needs.  Pt was supposed to be reassessed by Dr. Delton Coombes today regarding if he was strong enough to resume chemotherapy but Dr. Raliegh Ip had mentioned that hospice would be appropriate if patient remained too weak to resume chemotherapy treatments.  Pt found to be severely deconditioned and gray coloration to skin and too weak to speak but able to blink eyes to questions.  He has had copious foul smelling diarrhea at home over last couple of days. He has had very limited oral intake and generalized  weakness.   ? ?Review of Systems: Review of Systems  ?Unable to perform ROS: Patient nonverbal  ?  ?Past Medical History:  ?Diagnosis Date  ? Colon cancer ([place])   ? Diabetes mellitus without complication ([place])   ? Hypertension   ? ? ?Past Surgical History:  ?Procedure Laterality Date  ? BIOPSY  01/13/2021  ? Procedure: BIOPSY;  Surgeon: Montez Morita, Quillian Quince, MD;  [facility];  Service: Gastroenterology;;  ? CATARACT EXTRACTION Right   ? COLONOSCOPY WITH PROPOFOL N/A 01/13/2021  ? Procedure: COLONOSCOPY WITH PROPOFOL;  Surgeon: Harvel Quale, MD;  Location: [facility];  Service: Gastroenterology;  Laterality: N/A;  11:05  ? ESOPHAGOGASTRODUODENOSCOPY (EGD) WITH PROPOFOL N/A 01/13/2021  ? Procedure: ESOPHAGOGASTRODUODENOSCOPY (EGD) WITH PROPOFOL;  Surgeon: Harvel Quale, MD;  Location: [facility];  Service: Gastroenterology;  Laterality: N/A;  ? IR IMAGING GUIDED PORT INSERTION  01/24/2021  ? LIVER BIOPSY  01/10/2021  ? U/S guided  ? POLYPECTOMY  01/13/2021  ? Procedure: POLYPECTOMY;  Surgeon: Montez Morita, Quillian Quince, MD;  Location: [facility];  Service: Gastroenterology;;  ? ? ? reports that he has never smoked. He has never used smokeless tobacco. He reports that he does not currently use alcohol. He reports that he does not currently use drugs. ? ?No Known Allergies ? ?Family History  ?Problem Relation Age of Onset  ? Cancer Father 78  ?     prostate ca  ? ? ?Prior to Admission medications   ?Medication Sig Start Date End Date Taking? Authorizing Provider  ?acetaminophen (TYLENOL) 500 MG  tablet Take 1,000 mg by mouth every 6 (six) hours as needed for moderate pain.   Yes [provider]  ?diphenoxylate-atropine (LOMOTIL) 2.5-0.025 MG tablet TAKE 2 TABLETS BY MOUTH 4 TIMES DAILY AS NEEDED FOR  DIARRHEA  AND  LOOSE  STOOLS  DO  NOT  TAKE  IMODIUM 10/24/21  Yes Derek Jack, MD  ?furosemide (LASIX) 40 MG tablet Take 1 tablet (40 mg total) by mouth daily as  needed. 10/26/21  Yes Derek Jack, MD  ?hydrocortisone (CORTEF) 10 MG tablet 20 mg (2 tablets) in the morning, 10 mg (1 tablet) at noon, and '10mg'$  (1 tablet) in the evening 10/19/21  Yes Patrecia Pour, MD  ?insulin NPH-regular Human (70-30) 100 UNIT/ML injection Inject 10-12 Units into the skin 2 (two) times daily as needed (BS).   Yes [provider]  ?magnesium oxide (MAG-OX) 400 (240 Mg) MG tablet Take 1 tablet (400 mg total) by mouth 2 (two) times daily. 10/23/21  Yes Derek Jack, MD  ?midodrine (PROAMATINE) 10 MG tablet Take 1 tablet (10 mg total) by mouth 3 (three) times daily with meals. 10/19/21 11/18/21 Yes Patrecia Pour, MD  ?morphine (MSIR) 15 MG tablet Take 1 tablet (15 mg total) by mouth 2 (two) times daily as needed for severe pain. 08/30/21  Yes Derek Jack, MD  ?ondansetron (ZOFRAN) 8 MG tablet Take 1 tablet (8 mg total) by mouth every 8 (eight) hours as needed for nausea. 02/27/21  Yes Derek Jack, MD  ?potassium chloride (KLOR-CON M) 10 MEQ tablet Take 2 tablets (20 mEq total) by mouth daily. 10/09/21  Yes Derek Jack, MD  ?simvastatin (ZOCOR) 20 MG tablet Take 20 mg by mouth daily.   Yes [provider]  ?tamsulosin (FLOMAX) 0.4 MG CAPS capsule Take 1 capsule (0.4 mg total) by mouth daily after supper. 07/19/21  Yes Derek Jack, MD  ?temazepam (RESTORIL) 30 MG capsule TAKE 1 CAPSULE BY MOUTH AT BEDTIME AS NEEDED FOR SLEEP 10/30/21  Yes Derek Jack, MD  ?lidocaine (XYLOCAINE) 5 % ointment Apply 1 application topically in the morning and at bedtime. Apply to both hands twice a day ?Patient not taking: Reported on 11/06/2021 04/20/21   Derek Jack, MD  ?Lidocaine, Anorectal, 5 % CREA Apply 1 application topically 2 (two) times daily as needed. ?Patient not taking: Reported on 11/06/2021 05/02/21   Derek Jack, MD  ?lidocaine-prilocaine (EMLA) cream Apply to affected area once ?Patient not taking: Reported on 11/06/2021  01/18/21   Heath Lark, MD  ?silver sulfADIAZINE (SILVADENE) 1 % cream Apply 1 application topically daily. ?Patient not taking: Reported on 11/06/2021 05/22/21   Derek Jack, MD  ?traZODone (DESYREL) 100 MG tablet Take 1 tablet (100 mg total) by mouth at bedtime. ?Patient not taking: Reported on 11/06/2021 10/31/21   Derek Jack, MD  ? ? ?Physical Exam: ?Vitals:  ? 11/06/21 1230 11/06/21 1300 11/06/21 1330 11/06/21 1400  ?BP: 93/67 94/65 105/72 91/61  ?Pulse: (!) 118 (!) 114 (!) 116 (!) 113  ?Resp: 19 (!) '23 19 14  '$ ?Temp:      ?TempSrc:      ?SpO2: 98% 99% 100% 99%  ?Weight:      ?Height:      ? ? ?Constitutional: pt appears grey, pale, severely emaciated and deconditioned. Appears to be dying. Pt only able to blink he is so exhausted and not able to speak.   ?Eyes: PERRL, lids and conjunctivae normal ?ENMT: Mucous membranes are pale and dry with thick coating.   ?Neck:  normal, supple, no masses, no thyromegaly ?Respiratory: shallow breathing bilateral.  ?Cardiovascular: normal s1, s2 sounds, no murmurs / rubs / gallops. No extremity edema. 2+ pedal pulses. No carotid bruits.  ?Abdomen: no tenderness, no masses palpated. No hepatosplenomegaly. Bowel sounds positive.  ?Musculoskeletal: no clubbing / cyanosis. No joint deformity upper and lower extremities. Good ROM, no contractures. Normal muscle tone.  ?Skin: no rashes, lesions, ulcers. No induration ?Neurologic: CN 2-12 grossly intact. Sensation intact, DTR normal. Strength 5/5 in all 4.  ?Psychiatric: Normal judgment and insight. Alert and oriented x 3. Normal mood.  ? ?Labs on Admission: I have personally reviewed following labs and imaging studies ? ?CBC: ?Recent Labs  ?Lab 11/06/21 ?1235  ?WBC 22.5*  ?NEUTROABS 19.4*  ?HGB 9.8*  ?HCT 29.1*  ?MCV 94.2  ?PLT 102*  ? ?Basic Metabolic Panel: ?Recent Labs  ?Lab 11/06/21 ?1235  ?NA 133*  ?K 4.1  ?CL 105  ?CO2 22  ?GLUCOSE 166*  ?BUN 32*  ?CREATININE 0.70  ?CALCIUM 7.9*  ? ?GFR: ?Estimated Creatinine  Clearance: 91.4 mL/min (by C-G formula based on SCr of 0.7 mg/dL). ?Liver Function Tests: ?Recent Labs  ?Lab 11/06/21 ?1235  ?AST 53*  ?ALT 34  ?ALKPHOS 315*  ?BILITOT 1.1  ?PROT 5.3*  ?ALBUMIN 2.7*  ? ?No resul

## 2021-11-06 NOTE — Assessment & Plan Note (Signed)
--   longstanding problem but supplemented with midodrine and hydrocortisone  ?-- continue supportive management ?

## 2021-11-06 NOTE — Sepsis Progress Note (Signed)
eLink monitoring code sepsis.  

## 2021-11-06 NOTE — Plan of Care (Signed)

## 2021-11-06 NOTE — Assessment & Plan Note (Signed)
--   requested assistance for palliative care  ?

## 2021-11-06 NOTE — Assessment & Plan Note (Signed)
--   follow closely ?

## 2021-11-06 NOTE — Hospital Course (Addendum)
61 y.o. 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  Patient follows with Dr. Delton Coombes on 4/3 when he had most recent chemotherapy (Monday through Wednesday).  He had just recently been discharged to complete a 10 day course of oral vancomycin however diarrhea returned after completing antibiotics.  Patient was admitted from 3/3 to 3/5 due to hypotension and was discharged with midodrine and hydrocortisone.  Spironolactone was held, but patient continued on Lasix as needed.  Family reports that for past couple of days he has had increase weakness, confusion, falling at home and was so weak that EMS had to transport to hospital today because he could not get up. He lives with his brother and his siblings have been caring for him but it has become very difficult to manage at home due to increasing skilled nursing needs.  Pt was supposed to be reassessed by Dr. Delton Coombes today regarding if he was strong enough to resume chemotherapy but Dr. Raliegh Ip had mentioned that hospice would be appropriate if patient remained too weak to resume chemotherapy treatments.  Pt found to be severely deconditioned and gray coloration to skin and too weak to speak but able to blink eyes to questions.  He has had copious foul smelling diarrhea at home over last couple of days. He has had very limited oral intake and generalized weakness.   ?

## 2021-11-06 NOTE — Assessment & Plan Note (Signed)
--   secondary to recalcitrant c diff infection ?-- also received long-acting G-CSF which is likely contributing  ? ?

## 2021-11-07 ENCOUNTER — Encounter (HOSPITAL_COMMUNITY): Payer: Self-pay | Admitting: Family Medicine

## 2021-11-07 DIAGNOSIS — C801 Malignant (primary) neoplasm, unspecified: Secondary | ICD-10-CM | POA: Diagnosis not present

## 2021-11-07 DIAGNOSIS — Z515 Encounter for palliative care: Secondary | ICD-10-CM

## 2021-11-07 DIAGNOSIS — Z7189 Other specified counseling: Secondary | ICD-10-CM

## 2021-11-07 DIAGNOSIS — C189 Malignant neoplasm of colon, unspecified: Secondary | ICD-10-CM | POA: Diagnosis not present

## 2021-11-07 DIAGNOSIS — R531 Weakness: Secondary | ICD-10-CM | POA: Diagnosis not present

## 2021-11-07 DIAGNOSIS — G893 Neoplasm related pain (acute) (chronic): Secondary | ICD-10-CM | POA: Diagnosis not present

## 2021-11-07 LAB — COMPREHENSIVE METABOLIC PANEL
ALT: 29 U/L (ref 0–44)
AST: 44 U/L — ABNORMAL HIGH (ref 15–41)
Albumin: 2.3 g/dL — ABNORMAL LOW (ref 3.5–5.0)
Alkaline Phosphatase: 279 U/L — ABNORMAL HIGH (ref 38–126)
Anion gap: 5 (ref 5–15)
BUN: 25 mg/dL — ABNORMAL HIGH (ref 6–20)
CO2: 19 mmol/L — ABNORMAL LOW (ref 22–32)
Calcium: 7.6 mg/dL — ABNORMAL LOW (ref 8.9–10.3)
Chloride: 109 mmol/L (ref 98–111)
Creatinine, Ser: 0.63 mg/dL (ref 0.61–1.24)
GFR, Estimated: >60 mL/min (ref >60)
Glucose, Bld: 154 mg/dL — ABNORMAL HIGH (ref 70–99)
Potassium: 4.1 mmol/L (ref 3.5–5.1)
Sodium: 133 mmol/L — ABNORMAL LOW (ref 135–145)
Total Bilirubin: 0.9 mg/dL (ref 0.3–1.2)
Total Protein: 4.9 g/dL — ABNORMAL LOW (ref 6.5–8.1)

## 2021-11-07 LAB — CBC WITH DIFFERENTIAL/PLATELET
Abs Immature Granulocytes: 0.14 10*3/uL — ABNORMAL HIGH (ref 0.00–0.07)
Basophils Absolute: 0 10*3/uL (ref 0.0–0.1)
Basophils Relative: 0 %
Eosinophils Absolute: 0.1 10*3/uL (ref 0.0–0.5)
Eosinophils Relative: 1 %
HCT: 26.1 % — ABNORMAL LOW (ref 39.0–52.0)
Hemoglobin: 8.3 g/dL — ABNORMAL LOW (ref 13.0–17.0)
Immature Granulocytes: 1 %
Lymphocytes Relative: 6 %
Lymphs Abs: 1.1 10*3/uL (ref 0.7–4.0)
MCH: 30.4 pg (ref 26.0–34.0)
MCHC: 31.8 g/dL (ref 30.0–36.0)
MCV: 95.6 fL (ref 80.0–100.0)
Monocytes Absolute: 0.9 10*3/uL (ref 0.1–1.0)
Monocytes Relative: 5 %
Neutro Abs: 14.5 10*3/uL — ABNORMAL HIGH (ref 1.7–7.7)
Neutrophils Relative %: 87 %
Platelets: 82 10*3/uL — ABNORMAL LOW (ref 150–400)
RBC: 2.73 MIL/uL — ABNORMAL LOW (ref 4.22–5.81)
RDW: 21.1 % — ABNORMAL HIGH (ref 11.5–15.5)
WBC: 16.7 10*3/uL — ABNORMAL HIGH (ref 4.0–10.5)
nRBC: 0 % (ref 0.0–0.2)

## 2021-11-07 LAB — MAGNESIUM: Magnesium: 1.9 mg/dL (ref 1.7–2.4)

## 2021-11-07 LAB — GLUCOSE, CAPILLARY: Glucose-Capillary: 123 mg/dL — ABNORMAL HIGH (ref 70–99)

## 2021-11-07 MED ORDER — BIOTENE DRY MOUTH MT LIQD: 15.0000 mL | OROMUCOSAL | Status: DC | PRN

## 2021-11-07 MED ORDER — HYDROMORPHONE HCL 1 MG/ML IJ SOLN
0.5000 mg | INTRAMUSCULAR | Status: DC | PRN
  Administered 2021-11-07 – 2021-11-08 (×2): 0.5 mg via INTRAVENOUS
  Filled 2021-11-07 (×2): qty 1

## 2021-11-07 MED ORDER — POLYVINYL ALCOHOL 1.4 % OP SOLN: 1.0000 [drp] | Freq: Four times a day (QID) | OPHTHALMIC | Status: DC | PRN

## 2021-11-07 MED ORDER — GLYCOPYRROLATE 1 MG PO TABS: 1.0000 mg | ORAL_TABLET | ORAL | Status: DC | PRN

## 2021-11-07 MED ORDER — GLYCOPYRROLATE 0.2 MG/ML IJ SOLN: 0.2000 mg | INTRAMUSCULAR | Status: DC | PRN

## 2021-11-07 MED ORDER — LORAZEPAM 2 MG/ML IJ SOLN: 0.5000 mg | INTRAMUSCULAR | Status: DC | PRN

## 2021-11-07 MED ORDER — CHLORHEXIDINE GLUCONATE CLOTH 2 % EX PADS
6.0000 | MEDICATED_PAD | Freq: Every day | CUTANEOUS | Status: DC
  Administered 2021-11-07: 6 via TOPICAL

## 2021-11-07 NOTE — Progress Notes (Signed)
?PROGRESS NOTE ? ? ?Henry Johns  YWV:[id number] DOB: [birth year] DOA: 11/06/2021 ?PCP: [facility]  ? ?Chief Complaint  ?Patient presents with  ? Weakness  ? ?Level of care: Med-Surg ? ?Brief Admission History:  ?61 y.o. 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  Patient follows with Dr. Delton Coombes on 4/3 when he had most recent chemotherapy (Monday through Wednesday).  He had just recently been discharged to complete a 10 day course of oral vancomycin however diarrhea returned after completing antibiotics.  Patient was admitted from 3/3 to 3/5 due to hypotension and was discharged with midodrine and hydrocortisone.  Spironolactone was held, but patient continued on Lasix as needed.  Family reports that for past couple of days he has had increase weakness, confusion, falling at home and was so weak that EMS had to transport to hospital today because he could not get up. He lives with his brother and his siblings have been caring for him but it has become very difficult to manage at home due to increasing skilled nursing needs.  Pt was supposed to be reassessed by Dr. Delton Coombes today regarding if he was strong enough to resume chemotherapy but Dr. Raliegh Ip had mentioned that hospice would be appropriate if patient remained too weak to resume chemotherapy treatments.  Pt found to be severely deconditioned and gray coloration to skin and too weak to speak but able to blink eyes to questions.  He has had copious foul smelling diarrhea at home over last couple of days. He has had very limited oral intake and generalized weakness.   ?  ?Assessment and Plan: ?* Generalized weakness ?-- secondary to failure to thrive, chemotherapy and metastatic colon cancer   ? 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 ?Sinus tachycardia ?-- secondary to active end stages of metastatic cancer and recalcitrant c diff infection ?-- improving with supportive treatments ?-- transition to full comfort care   ? ?Thrombocytopenia ([place]) ?-- holding all heparins, no evidence of active bleeding at this time ?-- he is now transitioning to full comfort measures ? ?Leukocytosis ?-- secondary to recalcitrant c diff infection ?-- also received long-acting G-CSF which is likely contributing  ? ? ?Severe diarrhea ?-- secondary to refractory c diff infection ?-- transition to full comfort care  ? ?Severe protein-calorie malnutrition ([place]) ?-- very poor prognosis at this time ?-- full comfort measures  ? ?Ascites ?-- pt had a paracentesis last Wednesday  ? ?Hypotension ?-- longstanding problem but supplemented with midodrine and hydrocortisone  ?-- continue supportive management ? ?Chronic pain ?-- palliative care requested to assist with end of life pain management  ? ?Hyperlipidemia ?-- holding statin medication at this time due to poor oral intake and patient is severely emaciated and is dying from metastatic cancer ? ? ?Diabetes mellitus with neuropathy ([place]) ?-- full comfort care started 5/2   ? ?Cancer associated pain ?-- requested assistance for palliative care  ? ?Colon cancer metastasized to liver [facility]) ?-- prognosis very poor ?-- pt is too deconditioned to continue chemotherapy ?-- palliative medicine consultation requested for goals of care  ? ?Metastasis to liver [facility]) ?-- pt at end stages of colon cancer  ?-- prognosis guarded  ?-- possible hospital death if not transferred to hospice ?--  spoke with family at length today ?-- spoke with brother who feels that patient expressed that he would not want to be artificially resuscitated or coded if no chance for meaningful recovery or improvement ?--DNR order placed ?--palliative medicine consultation requested   ? ?DVT prophylaxis: full comfort care  ?Code Status: DNR  ?Family Communication: brother/sisters at bedside  ?Disposition: Status is: Inpatient ?Remains inpatient appropriate because: transition to full comfort care  ?  ?Consultants:  ?Palliative care  ?Procedures:  ? ?Antimicrobials:  ?N/a  ?Subjective: ?Pt only able to blink.  ?Objective: ?Vitals:  ? 11/07/21 0900 11/07/21 1000 11/07/21 1126 11/07/21 1735  ?BP: 100/64 104/66  111/69  ?Pulse: 89 90  (!) 101  ?Resp: '14 13  13  '$ ?Temp:   97.7 ?F (36.5 ?C)   ?TempSrc:   Oral   ?SpO2: 97% 99%  98%  ?Weight:      ?Height:      ? ? ?Intake/Output Summary (Last 24 hours) at 11/07/2021 1838 ?Last data filed at 11/07/2021 0732 ?Gross per 24 hour  ?Intake 440 ml  ?Output 500 ml  ?Net -60 ml  ? ?Filed Weights  ? 11/06/21 1211 11/06/21 1632 11/07/21 0500  ?Weight: 65.8 kg 68.8 kg 70.2 kg  ? ?Examination: ? ?Constitutional: pt appears grey, pale, severely emaciated and deconditioned. Appears to be dying. Pt only able to blink he is so exhausted and not able to speak.   ?Eyes: PERRL, lids and conjunctivae normal ?ENMT: Mucous membranes are pale and dry with thick coating.   ?Neck: normal, supple, no masses, no thyromegaly ?Respiratory: shallow breathing bilateral.  ?Cardiovascular: normal s1, s2 sounds, no murmurs / rubs / gallops. No extremity edema. 2+ pedal pulses. No carotid bruits.  ?Abdomen: no tenderness, no masses palpated. No hepatosplenomegaly. Bowel sounds positive.  ?Musculoskeletal: no clubbing / cyanosis. No joint deformity upper and lower extremities. Good ROM, no contractures. Normal muscle tone.  ?Skin: no rashes, lesions, ulcers. No induration ?Neurologic: CN 2-12 grossly intact. Sensation intact, DTR normal. Strength 5/5 in all 4.  ?Psychiatric: UTD judgment and insight. Somnolent.  Flat affect.  ? ?Data Reviewed: I have personally reviewed following labs and imaging studies ? ?CBC: ?Recent Labs  ?Lab 11/06/21 ?1235 11/07/21 ?0414  ?WBC 22.5* 16.7*  ?NEUTROABS  19.4* 14.5*  ?HGB 9.8* 8.3*  ?HCT 29.1* 26.1*  ?MCV 94.2 95.6  ?PLT 102* 82*  ? ? ?Basic Metabolic Panel: ?Recent Labs  ?Lab 11/06/21 ?1235 11/07/21 ?0414  ?NA 133* 133*  ?K 4.1 4.1  ?CL 105 109  ?CO2 22 19*  ?GLUCOSE 166* 154*  ?BUN 32* 25*  ?CREATININE 0.70 0.63  ?CALCIUM 7.9* 7.6*  ?MG  --  1.9  ? ? ?CBG: ?Recent Labs  ?Lab 11/06/21 ?1630 11/06/21 ?2123 11/07/21 ?0730  ?GLUCAP 169* 138* 123*  ? ? ?Recent Results (from the past 240 hour(s))  ?Resp Panel by RT-PCR (Flu A&B, Covid) Nasopharyngeal Swab     Status: None  ? Collection Time: 11/06/21 12:34 PM  ? Specimen: Nasopharyngeal Swab; Nasopharyngeal(NP) swabs in vial transport medium  ?Result Value Ref Range Status  ? SARS Coronavirus 2 by RT PCR NEGATIVE NEGATIVE Final  ?  Comment: (NOTE) ?SARS-CoV-2 target nucleic acids are NOT DETECTED. ? ?The SARS-CoV-2 RNA is generally detectable in upper respiratory ?specimens during the acute phase of infection. The lowest ?concentration of SARS-CoV-2 viral copies this assay can detect is ?138 copies/mL. A negative result does not preclude SARS-Cov-2 ?infection and should not be used as the sole basis for  treatment or ?other patient management decisions. A negative result may occur with  ?improper specimen collection/handling, submission of specimen other ?than nasopharyngeal swab, presence of viral mutation(s) within the ?areas targeted by this assay, and inadequate number of viral ?copies(<138 copies/mL). A negative result must be combined with ?clinical observations, patient history, and epidemiological ?information. The expected result is Negative. ? ?Fact Sheet for Patients:  ?EntrepreneurPulse.com.au ? ?Fact Sheet for Healthcare Providers:  ?IncredibleEmployment.be ? ?This test is no t yet approved or cleared by the [place] FDA and  ?has been authorized for detection and/or diagnosis of SARS-CoV-2 by ?FDA under an Emergency Use Authorization (EUA). This EUA will remain  ?in  effect (meaning this test can be used) for the duration of the ?COVID-19 declaration under Section 564(b)(1) of the Act, 21 ?U.S.C.section 360bbb-3(b)(1), unless the authorization is terminated  ?or rev

## 2021-11-07 NOTE — Consult Note (Signed)
? ?                                                                                ?Consultation Note ?Date: 11/07/2021  ? ?Patient Name: Henry Johns  ?DOB: 08-17-1960  MRN: [id number]  Age / Sex: 61 y.o., male  ?PCP: [facility] ?Referring Physician: Murlean Iba, MD ? ?Reason for Consultation: Establishing goals of care, Hospice Evaluation, and Inpatient hospice referral ? ?HPI/Patient Profile: 61 y.o. 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  ? ?Clinical Assessment and Goals of Care: ?I have reviewed medical records including EPIC notes, labs and imaging, received report from RN, assessed the patient.  Henry Johns is lying quietly in bed.  He appears acutely/chronically ill and quite frail.  He is resting comfortably during my first visit with his wife of 11 years, Henry Johns, at bedside.  I returned later in the morning.  When I returned Henry Johns is awake, he will make an somewhat keep eye contact.  He is alert and oriented x3.  Sisters Remo Lipps and Vaughan Basta arrived at bedside and we plan for a family meeting with my goals brother/healthcare surrogate, Henry Johns.    ? ?I return later in the morning to find Henry Johns at bedside.  We met at the bedside to discuss diagnosis prognosis, GOC, EOL wishes, disposition and options.  I introduced Palliative Medicine as specialized medical care for people living with serious illness. It focuses on providing relief from the symptoms and stress of a serious illness. The goal is to improve quality of life for both the patient and the family.Henry Johns has asked Henry Johns about his choices and Henry Johns states that he is agreeable to go to residential hospice and went for[facility].  ? ?We discussed a brief life review of the patient.  Henry Johns and Henry Johns have been married for about  11 years.  Henry Johns was just diagnosed with cancer July 2022.  He has been working with Museum/gallery conservator, but has continued to be weak, with markedly decline in functional status over the last 1 week.  He has been hospitalized 4 times recently.   ? ?We focused on their current illness.  We talked about [name] metastatic cancer burden, that he is not strong enough to take any further treatments.  We talk about his frailty, declining functional status declining by mouth intake.  The natural disease trajectory and expectations at EOL were discussed.  Advanced directives, concepts specific to code status, were verified. ? ?Hospice services outpatient were explained and offered.  We talk about residential hospice, what is and is not provided.  At this point, patient and family are electing residential hospice in [place], [facility].  We talk about unburdening Henry Johns from medications that are changing what is happening.  We talk about adding medications for comfort, pain/anxiety/breathlessness. ? ?We talk about prognosis with permission.  Henry Johns states that he was told by the doctors that Henry Johns had 2 weeks or less.  I shared that that sounds accurate, but it may be  even less.  We talk about Henry Johns's frailty and that he may soon be too weak to leave the hospital. ? ?Discussed the importance of continued conversation with family and the medical providers regarding overall plan of care and treatment options, ensuring decisions are within the context of the patient?s values and GOCs.  Questions and concerns were addressed.  The family was encouraged to call with questions or concerns.  PMT will continue to support holistically. ? ?Conference with attending, bedside nursing staff, oncology RN, transition of care team related to patient condition, needs, goals of care, disposition. ? ? ?HCPOA   ?NEXT OF KIN -brother, Henry Johns.  Henry Johns's been married to Henry Johns for 11 years but she defers decision-making to  Henry per [name]'s wishes. ?  ? ?SUMMARY OF RECOMMENDATIONS   ?Requesting comfort and dignity at end-of-life, residential hospice/[facility] ?End-of-life order set implemented ? ? ? ?Code Status/Advance Care Planning: ?DNR ? ?Symptom Management:  ?Okay Dr. ? ?PAlso,iative Prophylaxis:  ?Frequent Pain Assessment, Oral Care, and Turn Reposition ? ?Additional Recommendations (Limitations, Scope, Preferences): ?Full Comfort Care ? ?Psycho-social/Spiritual:  ?Desire for further Chaplaincy support:no ?Additional Recommendations: Caregiving  Support/Resources and Education on Hospice ? ?Prognosis:  ?< 2 weeks ? ?Discharge Planning: Hospice facility  ? ?  ? ?Primary Diagnoses: ?Present on Admission: ? Metastasis to liver (HCC) ? Colon cancer metastasized to liver (HCC) ? Severe diarrhea ? Ascites ? Severe protein-calorie malnutrition (HCC) ? FTT (failure to thrive) in adult ? Cancer associated pain ? Diabetes mellitus with neuropathy (HCC) ? Hyperlipidemia ? Hypotension ? Chronic pain ? Leukocytosis ? 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 Hypoalbuminemia ? Thrombocytopenia (HCC) ? Sinus tachycardia ? ? ?I have reviewed the medical record, interviewed the patient and family, and examined the patient. The following aspects are pertinent. ? ?Past Medical History:  ?Diagnosis Date  ? Colon cancer (HCC)   ? Diabetes mellitus without complication (HCC)   ? Hypertension   ? ?Social History  ? ?Socioeconomic History  ? Marital status: Married  ?  Spouse name: Not on file  ? Number of children: Not on file  ? Years of education: Not on file  ? Highest education level: Not on file  ?Occupational History  ? Not on file  ?Tobacco Use  ? Smoking status: Never  ? Smokeless tobacco: Never  ?Vaping Use  ? Vaping Use: Never used  ?Substance and Sexual Activity  ? Alcohol use: Not Currently  ? Drug use: Not Currently  ? Sexual activity: Not Currently  ?Other Topics Concern  ? Not on file  ?Social History Narrative  ?  Married, no childer  ? ?Social Determinants of Health  ? ?Financial Resource Strain: High Risk  ? Difficulty of Paying Living Expenses: Very hard  ?Food Insecurity: No Food Insecurity  ? Worried About Running Out of Food in the Last Year: Never true  ? Ran Out of Food in the Last Year: Never true  ?Transportation Needs: No Transportation Needs  ? Lack of Transportation (Medical): No  ? Lack of Transportation (Non-Medical): No  ?Physical Activity: Inactive  ? Days of Exercise per Week: 0 days  ? Minutes of Exercise per Session: 0 min  ?Stress: Not on file  ?Social Connections: Not on file  ? ?Family History  ?Problem Relation Age of Onset  ? Cancer Father 60  ?     prostate ca  ? ?Scheduled Meds: ? Chlorhexidine Gluconate Cloth  6 each Topical Daily  ? fidaxomicin  200 mg   Oral BID  ? hydrocortisone  20 mg Oral Q breakfast  ? And  ? hydrocortisone  10 mg Oral Q1400  ? And  ? hydrocortisone  10 mg Oral QHS  ? insulin aspart  0-9 Units Subcutaneous TID WC  ? midodrine  10 mg Oral TID WC  ? pantoprazole (PROTONIX) IV  40 mg Intravenous Daily  ? tamsulosin  0.4 mg Oral QPC supper  ? traZODone  100 mg Oral QHS  ? ?Continuous Infusions: ? lactated ringers 100 mL (11/06/21 1508)  ? ?PRN Meds:.diphenoxylate-atropine, HYDROmorphone (DILAUDID) injection, ibuprofen, ondansetron **OR** ondansetron (ZOFRAN) IV, oxyCODONE, temazepam ?Medications Prior to Admission:  ?Prior to Admission medications   ?Medication Sig Start Date End Date Taking? Authorizing Provider  ?acetaminophen (TYLENOL) 500 MG tablet Take 1,000 mg by mouth every 6 (six) hours as needed for moderate pain.   Yes [provider]  ?diphenoxylate-atropine (LOMOTIL) 2.5-0.025 MG tablet TAKE 2 TABLETS BY MOUTH 4 TIMES DAILY AS NEEDED FOR  DIARRHEA  AND  LOOSE  STOOLS  DO  NOT  TAKE  IMODIUM 10/24/21  Yes Katragadda, Sreedhar, MD  ?furosemide (LASIX) 40 MG tablet Take 1 tablet (40 mg total) by mouth daily as needed. 10/26/21  Yes Katragadda, Sreedhar, MD   ?hydrocortisone (CORTEF) 10 MG tablet 20 mg (2 tablets) in the morning, 10 mg (1 tablet) at noon, and 10mg (1 tablet) in the evening 10/19/21  Yes Grunz, Ryan B, MD  ?insulin NPH-regular Human (70-30) 100 UNIT/

## 2021-11-07 NOTE — TOC Initial Note (Signed)
Transition of Care (TOC) - Initial/Assessment Note  ? ? ?Patient Details  ?Name: Henry Johns ?MRN: [id number] ?Date of Birth: 1961/03/30 ? ?Transition of Care (TOC) CM/SW Contact:    ?Boneta Lucks, RN ?Phone Number: ?11/07/2021, 1:33 PM ? ?Clinical Narrative:    Palliative consulted for Residential Hospice. Family requesting [facility].  Refer sent, Marchia Meiers confirmed they do not have any beds. They chart for review and possible assess for GIP bed. TOC to follow.   ? ? ?Expected Discharge Plan: [place] ?Barriers to Discharge: Hospice Bed not available ? ?Patient Goals and CMS Choice ?Patient states their goals for this hospitalization and ongoing recovery are:: agreeable to residential hospice ?CMS Medicare.gov Compare Post Acute Care list provided to:: Patient Represenative (must comment) ?Choice offered to / list presented to : Spouse ? ?Expected Discharge Plan and Services ?Expected Discharge Plan: [place] ?  ?   ?Prior Living Arrangements/Services ?  ?  ?Activities of Daily Living ?Home Assistive Devices/Equipment: CBG Meter, Walker (specify type) ?ADL Screening (condition at time of admission) ?Patient's cognitive ability adequate to safely complete daily activities?: Yes ?Is the patient deaf or have difficulty hearing?: No ?Does the patient have difficulty seeing, even when wearing glasses/contacts?: No ?Does the patient have difficulty concentrating, remembering, or making decisions?: No ?Patient able to express need for assistance with ADLs?: Yes ?Does the patient have difficulty dressing or bathing?: Yes ?Independently performs ADLs?: No ?Communication: Independent ?Dressing (OT): Needs assistance ?Is this a change from baseline?: Pre-admission baseline ?Grooming: Needs assistance ?Is this a change from baseline?: Pre-admission baseline ?Feeding: Independent ?Bathing: Needs assistance ?Is this a change from baseline?: Pre-admission baseline ?Toileting: Needs  assistance ?Is this a change from baseline?: Pre-admission baseline ?In/Out Bed: Needs assistance ?Is this a change from baseline?: Pre-admission baseline ?Walks in Home: Dependent ?Is this a change from baseline?: Pre-admission baseline ?Does the patient have difficulty walking or climbing stairs?: Yes ?Weakness of Legs: Both ?Weakness of Arms/Hands: Both ? ?Emotional Assessment ?  ?  ?Admission diagnosis:  Metastatic cancer to liver [facility]) [C78.7] ?Generalized weakness [R53.1] ?Hypotensive episode [I95.9] ?Patient Active Problem List  ? Diagnosis Date Noted  ? Severe diarrhea 11/06/2021  ? FTT (failure to thrive) in adult 11/06/2021  ? Leukocytosis 11/06/2021  ? Anemia associated with malignant neoplastic disease ([place]) 11/06/2021  ? Thrombocytopenia ([place]) 11/06/2021  ? Sinus tachycardia 11/06/2021  ? Severe protein-calorie malnutrition ([place]) 10/17/2021  ? Generalized weakness 10/16/2021  ? Normocytic anemia 10/16/2021  ? Hypoalbuminemia 10/16/2021  ? Elevated alpha fetoprotein 10/16/2021  ? Ascites 10/16/2021  ? Acute diarrhea 10/15/2021  ? Hypotension 09/09/2021  ? Colitis 06/01/2021  ? Hypokalemia 06/01/2021  ? Hyperlipidemia 06/01/2021  ? Chronic pain 06/01/2021  ? 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 Metastasis to liver ([place]) 01/04/2021  ? Colon cancer metastasized to liver [facility]) 01/04/2021  ? Other constipation 01/04/2021  ? Rectal bleeding 01/04/2021  ? Nausea without vomiting 01/04/2021  ? 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 Diabetes mellitus with neuropathy ([place])   ? ?PCP:  [facility] ?Pharmacy:   ?[place], [place] [address] ?[address] ?[place] [address] ?Phone: [phone] Fax: [phone] ? ?[place] ?[place]  [address] ?Phone: [phone] Fax: [phone] ? ?Readmission Risk Interventions ? ?  11/07/2021  ?  1:31 PM 10/16/2021  ?  3:14 PM  ?Readmission Risk Prevention  Plan  ?Transportation  Screening Complete Complete  ?[place] or Home Care Consult Complete   ?Palliative Care Screening Complete   ?Medication Review Press photographer) Complete Complete  ?PCP or Specialist appointment within 3-5 days of discharge  Not Complete  ?[place] or Home Care Consult  Complete  ?SW Recovery Care/Counseling Consult  Complete  ?Palliative Care Screening  Not Complete  ?[place]  Not Applicable  ? ? ? ?

## 2021-11-08 ENCOUNTER — Inpatient Hospital Stay (HOSPITAL_COMMUNITY): Payer: Medicaid Other

## 2021-11-08 DIAGNOSIS — Z515 Encounter for palliative care: Secondary | ICD-10-CM | POA: Diagnosis not present

## 2021-11-08 DIAGNOSIS — C189 Malignant neoplasm of colon, unspecified: Secondary | ICD-10-CM | POA: Diagnosis not present

## 2021-11-08 DIAGNOSIS — R531 Weakness: Secondary | ICD-10-CM | POA: Diagnosis not present

## 2021-11-08 DIAGNOSIS — G893 Neoplasm related pain (acute) (chronic): Secondary | ICD-10-CM | POA: Diagnosis not present

## 2021-11-08 MED ORDER — METOPROLOL TARTRATE 5 MG/5ML IV SOLN
5.0000 mg | Freq: Four times a day (QID) | INTRAVENOUS | Status: DC | PRN
  Administered 2021-11-08: 5 mg via INTRAVENOUS
  Filled 2021-11-08: qty 5

## 2021-11-08 MED ORDER — MAGNESIUM OXIDE -MG SUPPLEMENT 400 (240 MG) MG PO TABS: 200.0000 mg | ORAL_TABLET | Freq: Every day | ORAL | Status: DC

## 2021-11-08 NOTE — TOC Progression Note (Signed)
Transition of Care (TOC) - Progression Note  ? ? ?Patient Details  ?Name: Henry Johns ?MRN: [id number] ?Date of Birth: [birth year] ? ?Transition of Care (TOC) CM/SW Contact  ?Boneta Lucks, RN ?Phone Number: ?11/08/2021, 12:02 PM ? ?Clinical Narrative:   Patient and family going back and forth with home with hospice vs residential.  CM met with patient, wife, [name] and his wife. [name] states they will assist with taking care of the patient at home. Patient just wanted to make sure he had enough help at home. Wife is in agreement. They all want to take patient home. TOC called Keka at [facility]. They will not be able to get hospital bed delivered until tomorrow morning. They will also set up transportation in the morning to get patient home.  MD/RN/Palliative update with discharge plan.  ? ? ?Expected Discharge Plan: [place] ?Barriers to Discharge: Other (must enter comment) (DME delivery) ? ?Expected Discharge Plan and Services ?Expected Discharge Plan: [place] ?        ?Readmission Risk Interventions ? ?  11/07/2021  ?  1:31 PM 10/16/2021  ?  3:14 PM  ?Readmission Risk Prevention Plan  ?Transportation Screening Complete Complete  ?[place] or Home Care Consult Complete   ?Palliative Care Screening Complete   ?Medication Review Press photographer) Complete Complete  ?PCP or Specialist appointment within 3-5 days of discharge  Not Complete  ?[place] or Home Care Consult  Complete  ?SW Recovery Care/Counseling Consult  Complete  ?Palliative Care Screening  Not Complete  ?[place]  Not Applicable  ? ? ?

## 2021-11-08 NOTE — Progress Notes (Signed)
?PROGRESS NOTE ? ? ?Henry Johns  ATF:[id number] DOB: [birth year] DOA: 11/06/2021 ?PCP: [facility]  ? ?Chief Complaint  ?Patient presents with  ? Weakness  ? ?Level of care: Med-Surg ? ?Brief Admission History:  ?61 y.o. 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  Patient follows with Dr. Delton Coombes on 4/3 when he had most recent chemotherapy (Monday through Wednesday).  He had just recently been discharged to complete a 10 day course of oral vancomycin however diarrhea returned after completing antibiotics.  Patient was admitted from 3/3 to 3/5 due to hypotension and was discharged with midodrine and hydrocortisone.  Spironolactone was held, but patient continued on Lasix as needed.  Family reports that for past couple of days he has had increase weakness, confusion, falling at home and was so weak that EMS had to transport to hospital today because he could not get up. He lives with his brother and his siblings have been caring for him but it has become very difficult to manage at home due to increasing skilled nursing needs.  Pt was supposed to be reassessed by Dr. Delton Coombes today regarding if he was strong enough to resume chemotherapy but Dr. Raliegh Ip had mentioned that hospice would be appropriate if patient remained too weak to resume chemotherapy treatments.  Pt found to be severely deconditioned and gray coloration to skin and too weak to speak but able to blink eyes to questions.  He has had copious foul smelling diarrhea at home over last couple of days. He has had very limited oral intake and generalized weakness.   ?  ?Assessment and Plan: ?Generalized weakness ?-- secondary to failure to thrive, chemotherapy and metastatic colon cancer   ?-Anticipate discharge home on 11/09/2021 once hospice is able to deliver equipment to patient's house ? ? ?Anemia associated with malignant neoplastic disease ([place]) ?-- follow closely ? ?Sinus tachycardia ?-- secondary to active end stages of metastatic cancer and recalcitrant c diff infection ?-- improving with supportive treatments ?-- transition to full comfort care   ? ?Thrombocytopenia ([place]) ?-- holding all heparins, no evidence of active bleeding at this time ?-- he is now transitioning to full comfort measures ? ?Leukocytosis ?-- secondary to recalcitrant c diff infection ?-- also received long-acting G-CSF which is likely contributing  ? ? ?Severe diarrhea ?-- secondary to refractory c diff infection ?-- transition to full comfort care  ? ?Severe protein-calorie malnutrition ([place]) ?-- very poor prognosis at this time ?-- full comfort measures  ? ?Ascites ?-- pt had a paracentesis last Wednesday  ? ?Hypotension ?-- longstanding problem but supplemented with midodrine and hydrocortisone  ?-- continue supportive management ? ?Chronic pain ?-- palliative care requested to assist with end of life pain management  ? ?Hyperlipidemia ?-- holding statin medication at this time due to poor oral intake and patient is severely emaciated and is dying from metastatic cancer ? ? ?Diabetes mellitus with neuropathy ([place]) ?-- full comfort care started 5/2   ? ?Cancer associated pain ?-- requested assistance for palliative care  ? ?Colon cancer metastasized to liver [facility]) ?-- prognosis very poor ?-- pt is too deconditioned to continue chemotherapy ?-- palliative medicine consultation requested for goals of care  ? ?Metastasis to liver [facility]) ?-- pt at end stages of colon cancer  ?--  prognosis guarded  ?-- possible hospital death if not transferred to hospice ?-- spoke with family at length today ?-- spoke with brother who feels that patient expressed that he would not want to be artificially resuscitated or coded if no chance  for meaningful recovery or improvement ?--DNR order placed ?--palliative medicine consultation requested  ? ?Social/ethics--- patient's brother [name] and patient's wife and patient at bedside ?-Anticipate discharge home on 11/09/2021 once hospice is able to deliver equipment to patient's house ? ? ?DVT prophylaxis: full comfort care  ?Code Status: DNR  ?Family Communication: brother/sisters at bedside  ?Disposition: Anticipate discharge home on 11/09/2021 once hospice is able to deliver equipment to patient's house ? ?Status is: Inpatient ?Remains inpatient appropriate because: transition to full comfort care  ?  ?Consultants:  ?Palliative care  ?Procedures:  ? ?Antimicrobials:  ?N/a  ?Subjective: ?-Wife at bedside ?-Patient was able to get out of bed to chair with assist of wife ?-Brother [name] visited ?-Anticipate discharge home on 11/09/2021 once hospice is able to deliver equipment to patient's house ? ? ?Objective: ?Vitals:  ? 11/08/21 0800 11/08/21 1200 11/08/21 1600 11/08/21 1809  ?BP: 114/78 115/76  118/81  ?Pulse: 99 100 97 97  ?Resp: '14 16 16 15  '$ ?Temp:      ?TempSrc:      ?SpO2: 98% 98% 99% 99%  ?Weight:      ?Height:      ? ?No intake or output data in the 24 hours ending 11/08/21 1843 ? ?Filed Weights  ? 11/06/21 1211 11/06/21 1632 11/07/21 0500  ?Weight: 65.8 kg 68.8 kg 70.2 kg  ? ?Examination: ? ?Constitutional: pt appears grey, pale, severely emaciated and deconditioned.  ?Eyes: PERRL, lids and conjunctivae normal ?ENMT: Mucous membranes are pale and dry with thick coating.   ?Respiratory: Diminished breath sounds, no wheezing ?Cardiovascular: normal s1, s2 sounds, tachycardic abdomen: Slightly distended, no significant tenderness, diminished bowel sounds noted  ?musculoskeletal: no clubbing / cyanosis. No joint deformity upper and lower extremities. Good ROM, no contractures. Normal muscle tone.  ?Skin: no rashes, lesions, ulcers. No induration ?Neurologic: CN 2-12 grossly intact. Sensation intact,  DTR normal. -Generalized weakness without new focal deficits ?Psychiatric: UTD judgment and insight. Somnolent.  Flat affect.  ? ?Data Reviewed: I have personally reviewed following labs and imaging studies ? ?CBC: ?Recent Labs  ?Lab 11/06/21 ?1235 11/07/21 ?0414  ?WBC 22.5* 16.7*  ?NEUTROABS 19.4* 14.5*  ?HGB 9.8* 8.3*  ?HCT 29.1* 26.1*  ?MCV 94.2 95.6  ?PLT 102* 82*  ? ? ?Basic Metabolic Panel: ?Recent Labs  ?Lab 11/06/21 ?1235 11/07/21 ?0414  ?NA 133* 133*  ?K 4.1 4.1  ?CL 105 109  ?CO2 22 19*  ?GLUCOSE 166* 154*  ?BUN 32* 25*  ?CREATININE 0.70 0.63  ?CALCIUM 7.9* 7.6*  ?MG  --  1.9  ? ? ?CBG: ?Recent Labs  ?Lab 11/06/21 ?1630 11/06/21 ?2123 11/07/21 ?0730  ?GLUCAP 169* 138* 123*  ? ? ?Recent Results (from the past 240 hour(s))  ?Resp Panel by RT-PCR (Flu A&B, Covid) Nasopharyngeal Swab     Status: None  ? Collection Time: 11/06/21 12:34 PM  ? Specimen: Nasopharyngeal Swab; Nasopharyngeal(NP) swabs in vial transport medium  ?Result Value Ref Range Status  ? SARS Coronavirus 2 by RT PCR NEGATIVE NEGATIVE Final  ?  Comment: (NOTE) ?SARS-CoV-2 target nucleic acids are NOT DETECTED. ? ?The SARS-CoV-2 RNA is generally detectable in upper respiratory ?specimens during the acute phase of infection. The lowest ?concentration of SARS-CoV-2 viral copies this assay  can detect is ?138 copies/mL. A negative result does not preclude SARS-Cov-2 ?infection and should not be used as the sole basis for treatment or ?other patient management decisions. A negative result may occur with  ?improper specimen collection/handling, submission of specimen other ?than nasopharyngeal swab, presence of viral mutation(s) within the ?areas targeted by this assay, and inadequate number of viral ?copies(<138 copies/mL). A negative result must be combined with ?clinical observations, patient history, and epidemiological ?information. The expected result is Negative. ? ?Fact Sheet for Patients:  ?EntrepreneurPulse.com.au ? ?Fact  Sheet for Healthcare Providers:  ?IncredibleEmployment.be ? ?This test is no t yet approved or cleared by the [place] FDA and  ?has been authorized for detection and/or diagnosis of

## 2021-11-08 NOTE — Progress Notes (Signed)
Palliative: ?Conference with bedside nursing staff who states that [name] told overnight nursing staff that he wanted to return to his own home with hospice.  Conference with transition of care team related to discharge. ? ?[name] is lying quietly in bed.  He appears acutely/chronically ill and quite frail.  [name] is alert and oriented, able to make his needs known.  His wife of 11 years, Henry Johns, is at bedside.  Meeting today with transition of care team present to facilitate discharge with hospice. ? ?We talk about discharging with hospice.  [name] shares that he does not feel that his family can care for him properly at home.  We talked about the benefits of residential hospice and the support he would receive. ? ?Later in the morning transition of care team is notified that [name] brother is requesting home with hospice, not residential hospice.  Transition care team is able to meet with [name] who now states that he would like to transition to at home hospice.  [facility] hospice updated by [facility]. ? ?Conference with attending, bedside nursing staff, transition of care team related to patient condition, needs, goals of care, disposition. ? ?Plan:   Comfort and dignity at end-of-life, Home with the benefits of hospice of [facility].   ? ?35 minutes  ?Henry Axe, NP ?Palliative medicine team ?Team phone [phone] ?Greater than 50% of this time was spent counseling and coordinating care related to the above assessment and plan. ?

## 2021-11-08 NOTE — Progress Notes (Signed)
Patient's HR sustaining in the 150-160s up to 190. Patient c/o pain 10/10 to back, PRN Dilaudid given. Dr. Denton Brick made aware, to order PRN Metoprolol. No EKG ordered at this time. Wife remains at bedside. ?

## 2021-11-09 DIAGNOSIS — Z66 Do not resuscitate: Secondary | ICD-10-CM

## 2021-11-09 DIAGNOSIS — Z515 Encounter for palliative care: Secondary | ICD-10-CM

## 2021-11-09 MED ORDER — HEPARIN SOD (PORK) LOCK FLUSH 100 UNIT/ML IV SOLN
500.0000 [IU] | Freq: Once | INTRAVENOUS | Status: AC
  Administered 2021-11-09: 500 [IU] via INTRAVENOUS
  Filled 2021-11-09: qty 5

## 2021-11-09 NOTE — Discharge Instructions (Signed)
discharge home with full comfort care and hospice services at home ?

## 2021-11-09 NOTE — Discharge Summary (Signed)
?                                                                                ? ? ?Henry Johns, is a 61 y.o. male  DOB 18-Jul-1960  MRN [id number]. ? ?Admission date:  11/06/2021  Admitting Physician  Murlean Iba, MD ? ?Discharge Date:  11/09/2021  ? ?Primary MD  [facility] ? ?Recommendations for primary care physician for things to follow:  ? ?   discharge home with full comfort care and hospice services at home ? ?Admission Diagnosis  Metastatic cancer to liver [facility]) [C78.7] ?Generalized weakness [R53.1] ?Hypotensive episode [I95.9] ? ? ?Discharge Diagnosis  Metastatic cancer to liver [facility]) [C78.7] ?Generalized weakness [R53.1] ?Hypotensive episode [I95.9]   ? ?Principal Problem: ?  Generalized weakness ?Active Problems: ?  FTT (failure to thrive) in adult ?  Anemia associated with malignant neoplastic disease ([place]) ?  Metastasis to liver [facility]) ?  Colon cancer metastasized to liver [facility]) ?  Cancer associated pain ?  Diabetes mellitus with neuropathy ([place]) ?  Hyperlipidemia ?  Chronic pain ?  Hypotension ?  Hypoalbuminemia ?  Ascites ?  Severe protein-calorie malnutrition ([place]) ?  Severe diarrhea ?  Leukocytosis ?  Thrombocytopenia ([place]) ?  Sinus tachycardia ?    ? ?Past Medical History:  ?Diagnosis Date  ? Colon cancer ([employer])   ? Diabetes mellitus without complication ([place])   ? Hypertension   ? ? ?Past Surgical History:  ?Procedure Laterality Date  ? BIOPSY  01/13/2021  ? Procedure: BIOPSY;  Surgeon: Montez Morita, Quillian Quince, MD;  Location: [facility];  Service: Gastroenterology;;  ? CATARACT EXTRACTION Right   ? COLONOSCOPY WITH PROPOFOL N/A 01/13/2021  ? Procedure: COLONOSCOPY WITH PROPOFOL;  Surgeon: Harvel Quale, MD;  Location: [facility] END[facility];  Service: Gastroenterology;  Laterality: N/A;  11:05  ? ESOPHAGOGASTRODUODENOSCOPY (EGD) WITH PROPOFOL N/A 01/13/2021  ? Procedure: ESOPHAGOGASTRODUODENOSCOPY (EGD) WITH PROPOFOL;  Surgeon: Harvel Quale, MD;   Location[facility];  Service: Gastroenterology;  Laterality: N/A;  ? IR IMAGING GUIDED PORT INSERTION  01/24/2021  ? LIVER BIOPSY  01/10/2021  ? U/S guided  ? POLYPECTOMY  01/13/2021  ? Procedure: POLYPECTOMY;  Surgeon: Montez Morita, Quillian Quince, MD;  Location: [facility];  Service: Gastroenterology;;  ? ? ? HPI  from the history and physical done on the day of admission:  ? ?Chief Complaint: severe weakness and diarrhea ?  ?HPI: Henry Johns is a 61 y.o. 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  Patient follows with Dr. Delton Coombes on 4/3 when he had most recent chemotherapy (Monday through Wednesday).  He had just recently been discharged to complete a 10 day course of oral vancomycin however diarrhea returned after completing antibiotics.  Patient was admitted from 3/3 to 3/5 due to hypotension and was discharged with midodrine and hydrocortisone.  Spironolactone was held, but patient continued on Lasix as needed.  Family reports that  for past couple of days he has had increase weakness, confusion, falling at home and was so weak that EMS had to transport to hospital today because he could not get up. He lives with his brother and his siblings have been caring for him but it has become very difficult to manage at home due to increasing skilled nursing needs.  Pt was supposed to be reassessed by Dr. Delton Coombes today regarding if he was strong enough to resume chemotherapy but Dr. Raliegh Ip had mentioned that hospice would be appropriate if patient remained too weak to resume chemotherapy treatments.  Pt found to be severely deconditioned and gray coloration to skin and too weak to speak but able to blink eyes to questions.  He has had copious foul smelling diarrhea at  home over last couple of days. He has had very limited oral intake and generalized weakness.   ? ? ? ? Hospital Course:  ? ?  ?61 y.o. 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  Patient follows with Dr. Delton Coombes on 4/3 when he had most recent chemotherapy (Monday through Wednesday).  He had just recently been discharged to complete a 10 day course of oral vancomycin however diarrhea returned after completing antibiotics.  Patient was admitted from 3/3 to 3/5 due to hypotension and was discharged with midodrine and hydrocortisone.  Spironolactone was held, but patient continued on Lasix as needed.  Family reports that for past couple of days he has had increase weakness, confusion, falling at home and was so weak that EMS had to transport to hospital today because he could not get up. He lives with his brother and his siblings have been caring for him but it has become very difficult to manage at home due to increasing skilled nursing needs.  Pt was supposed to be reassessed by Dr. Delton Coombes today regarding if he was strong enough to resume chemotherapy but Dr. Raliegh Ip had mentioned that hospice would be appropriate if patient remained too weak to resume chemotherapy treatments.  Pt found to be severely deconditioned and gray coloration to skin and too weak to speak but able to blink eyes to questions.  He has had copious foul smelling diarrhea at home over last couple of days. He has had very limited oral intake and generalized weakness.   ? ?Assessment and Plan: ? ?Generalized weakness ?-- Secondary to failure to thrive, chemotherapy and metastatic colon cancer  ?-Discharge home on 11/09/2021  ?--hospice has delivered equipment to patient's house ?  ? Anemia associated with  malignant neoplastic disease ([place]) ?-- -Comfort care/hospice protocol ?  ?Sinus tachycardia ?-- secondary to active end stages of metastatic cancer and recalcitrant c diff infection ?Comfort care/hospice protocol ? ?  ?Thrombocytopenia ([place]) ?-- holding all heparins, no evidence of active bleeding at this time ?-- he is now transitioning to full comfort measures ?-Comfort care/hospice protocol ?  ?Leukocytosis ?-- secondary to recalcitrant c diff infection ?-- also received long-acting G-CSF which is likely contributing  ?  ?  ?Severe diarrhea ?-- secondary to refractory c diff infection ?-- transition to full comfort care  ?  ?Severe protein-calorie malnutrition ([place]) ?-- very poor prognosis at this time ?-- full comfort measures  ?  ?Ascites ?-- pt had a paracentesis this admission ?-Comfort care/hospice protocol ?  ?Hypotension ?--  longstanding problem but supplemented with midodrine and hydrocortisone  ?-Comfort care/hospice protocol ?  ?Chronic pain ?-- palliative care requested to assist with end of life pain management  ?Comfort care/hospice protocol ?  ?Hyperlipidemia ?-- holding statin medication at this time due to poor oral intake and patient is severely emaciated and is dying from metastatic cancer ?  ?  ?Diabetes mellitus with neuropathy ([place]) ?-- full comfort care started 5/2   ?  ?Cancer associated pain ?Comfort care/hospice protocol ?  ?Colon cancer metastasized to liver [facility]) ?-- prognosis very poor ?-- pt is too deconditioned to continue chemotherapy ?Comfort care/hospice protocol ?  ?Metastasis to liver [facility]) ?-- pt at end stages of colon cancer  ?-- spoke with brother who feels that patient expressed that he would not want to be artificially resuscitated or coded if no chance for meaningful recovery or improvement ?--DNR order placed ?--palliative medicine consultation requested  ?Comfort care/hospice protocol ?  ?Social/ethics--- patient's brother [name] and patient's wife and patient at  bedside ?Comfort care/hospice protocol ?  ? ?Code Status: DNR  ?Family Communication: brother,  wife and sister-in-law at bedside at bedside  ?Disposition: Anticipate discharge home on 11/09/2021 once hospice is

## 2021-11-09 NOTE — Progress Notes (Addendum)
Patient cleaned and bed pad replaced. Patient taken off monitor and placed in disposable gown. Port de-accessed, site clean and dry, band-aid placed over site. EMS arrived and patient transferred from bed to stretcher. Discharge paperwork provided to EMS. Pt discharged to home with hospice. ?

## 2021-11-09 NOTE — TOC Transition Note (Signed)
Transition of Care (TOC) - CM/SW Discharge Note ? ? ?Patient Details  ?Name: Henry Johns ?MRN: [id number] ?Date of Birth: 03-03-1961 ? ?Transition of Care (TOC) CM/SW Contact:  ?Boneta Lucks, RN ?Phone Number: ?11/09/2021, 11:37 AM ? ? ?Clinical Narrative:   [facility] confirmed DME is was delivered. They set up [name] for transport home. Family at the bedside, Med necessity completed. ? ? ?Final next level of care: [place] ?Barriers to Discharge: Barriers Resolved ? ? ?Patient Goals and CMS Choice ?Patient states their goals for this hospitalization and ongoing recovery are:: going home. ?CMS Medicare.gov Compare Post Acute Care list provided to:: Patient ?Choice offered to / list presented to : Patient ? ?Discharge Placement ?  ?           ?  ?Patient to be transferred to facility by: [facility] ?Name of family member notified: [name] ?Patient and family notified of of transfer: 11/09/21 ? ? ?Readmission Risk Interventions ? ?  11/07/2021  ?  1:31 PM 10/16/2021  ?  3:14 PM  ?Readmission Risk Prevention Plan  ?Transportation Screening Complete Complete  ?[place] or Home Care Consult Complete   ?Palliative Care Screening Complete   ?Medication Review [employer]) Complete Complete  ?PCP or Specialist appointment within 3-5 days of discharge  Not Complete  ?[place] or Home Care Consult  Complete  ?SW Recovery Care/Counseling Consult  Complete  ?Palliative Care Screening  Not Complete  ?[place]  Not Applicable  ? ? ? ? ? ?

## 2021-11-09 NOTE — Progress Notes (Signed)
Palliative: ?Chart review completed.  Henry Johns is to discharge home via EMS with the benefits of hospice of [facility]. ?Conference with bedside nursing staff, no needs at this time. ?DNR/[place]enrod form completed and placed on chart. ? ?No charge ?Quinn Axe, NP ?Palliative medicine team ?Team phone [phone] ?Greater than 50% of this time was spent counseling and coordinating care related to the above assessment and plan. ?

## 2021-11-11 LAB — CULTURE, BLOOD (ROUTINE X 2)
Culture: NO GROWTH
Culture: NO GROWTH
Special Requests: ADEQUATE

## 2021-11-13 ENCOUNTER — Encounter (HOSPITAL_COMMUNITY): Payer: Self-pay | Admitting: Hematology

## 2021-11-20 ENCOUNTER — Inpatient Hospital Stay (HOSPITAL_COMMUNITY): Payer: Medicaid Other

## 2021-11-20 ENCOUNTER — Encounter (HOSPITAL_COMMUNITY): Payer: Medicaid Other | Admitting: Dietician

## 2021-11-20 ENCOUNTER — Inpatient Hospital Stay (HOSPITAL_COMMUNITY): Payer: Medicaid Other | Admitting: Hematology

## 2021-11-22 ENCOUNTER — Inpatient Hospital Stay (HOSPITAL_COMMUNITY): Payer: Medicaid Other

## 2021-12-07 DEATH — deceased

## 2022-10-26 IMAGING — CT CT ABD-PELV W/ CM
2 of 5 series · 15 of 46 positions shown, 17 images · IV contrast (Omnipaque or Isovue)
Comparison: CT of the chest abdomen pelvis dated 04/19/2021.

CLINICAL DATA: Abdominal pain.  Metastatic colon cancer.

EXAM:
CT ABDOMEN AND PELVIS WITH CONTRAST
TECHNIQUE: Multidetector CT imaging of the abdomen and pelvis was performed
using the standard protocol following bolus administration of
intravenous contrast.
CONTRAST:  100mL OMNIPAQUE IOHEXOL 300 MG/ML  SOLN

[Series 2: axial st · axial · 0.72mm/px · z∈[+764,+1274]mm · 12 of 115 slices shown, 14 images]
[im 7/115  soft-tissue]
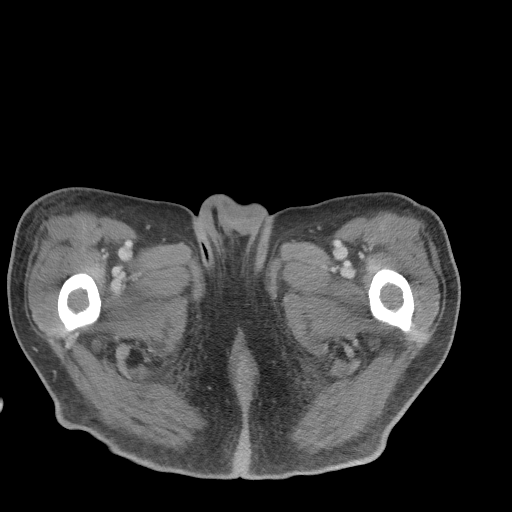
[im 7/115  bone]
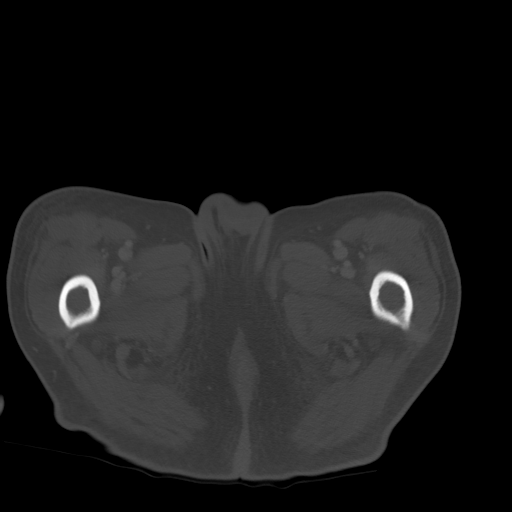
[im 19/115  soft-tissue]
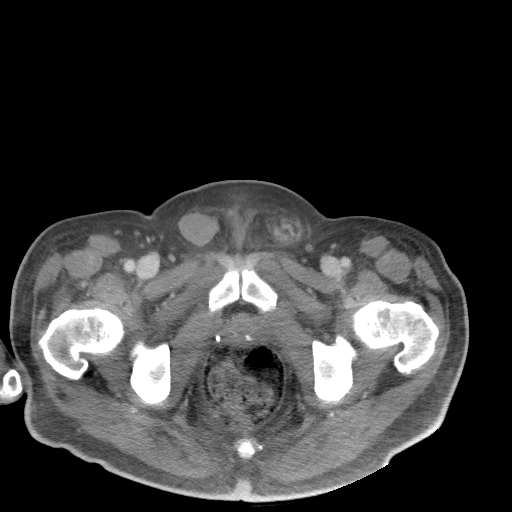
[im 25/115  soft-tissue]
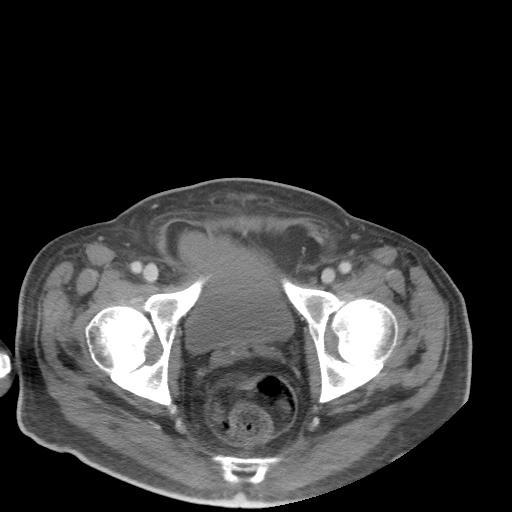
[im 37/115  soft-tissue]
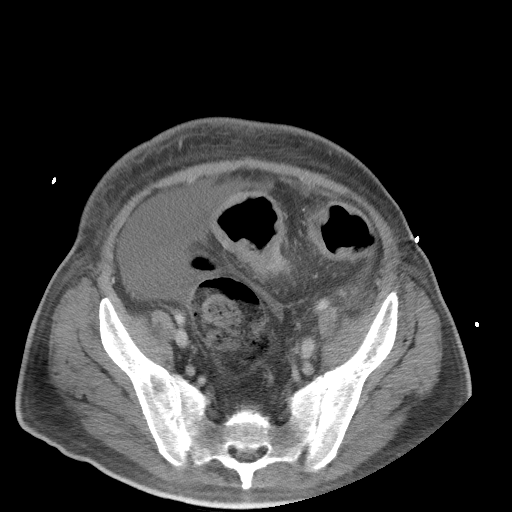
[im 43/115  soft-tissue]
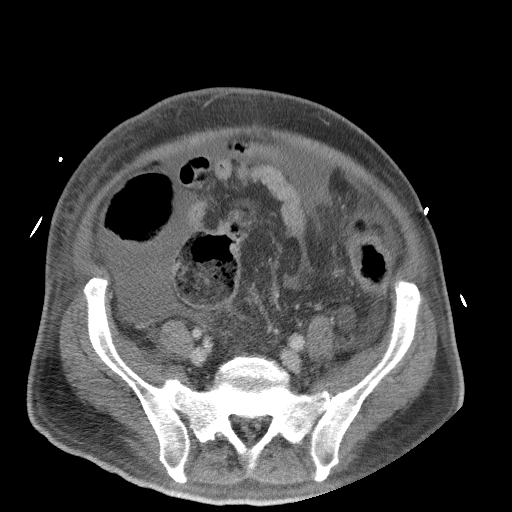
[im 55/115  soft-tissue]
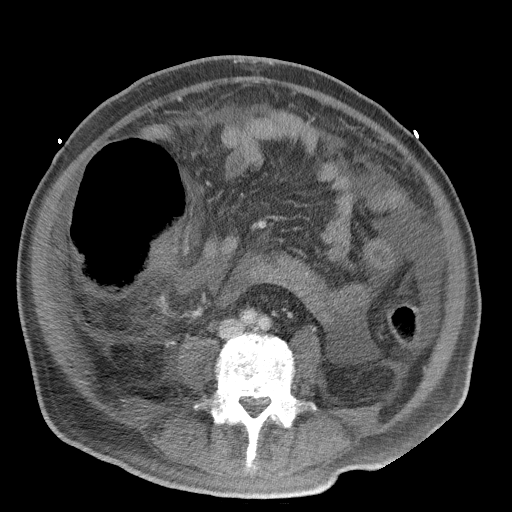
[im 61/115  soft-tissue]
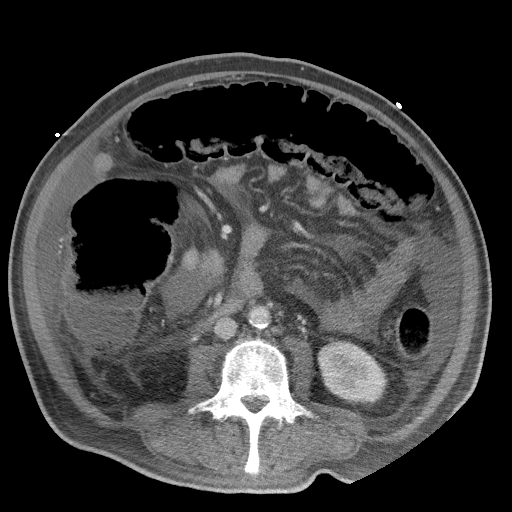
[im 73/115  soft-tissue]
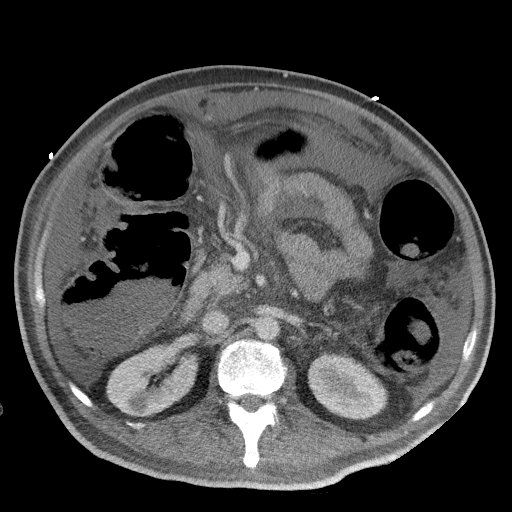
[im 79/115  soft-tissue]
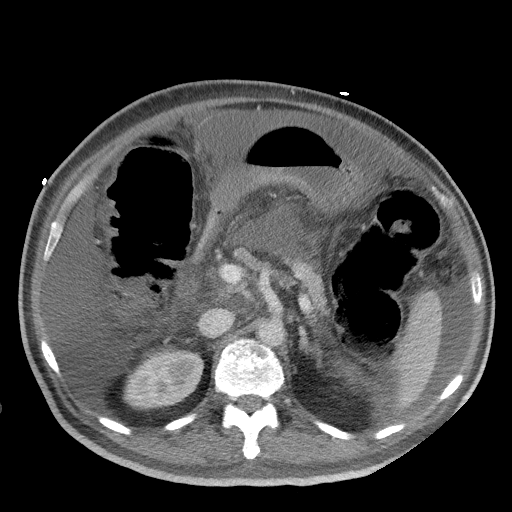
[im 79/115  bone]
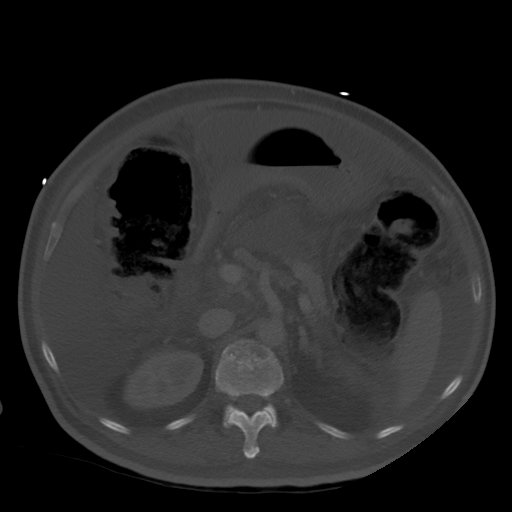
[im 91/115  soft-tissue]
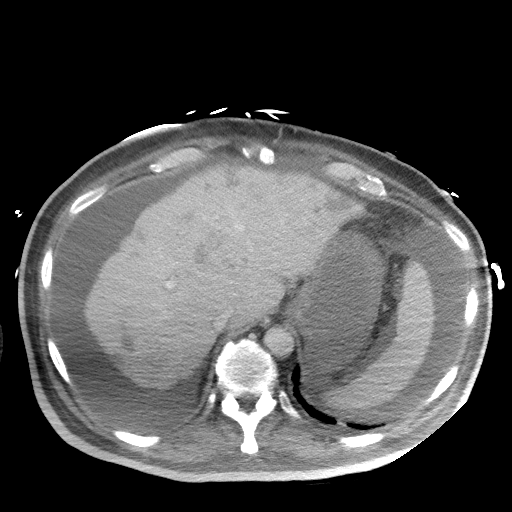
[im 97/115  soft-tissue]
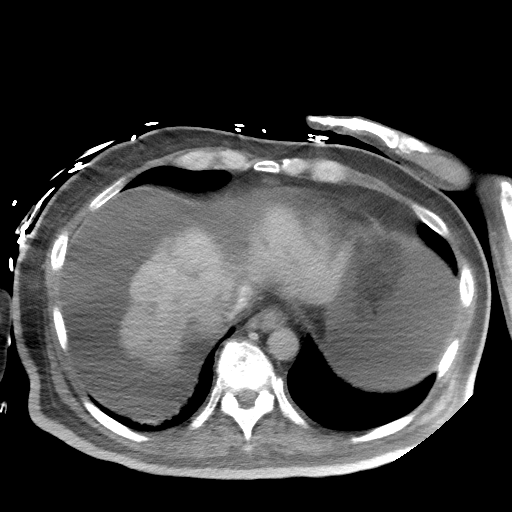
[im 109/115  soft-tissue]
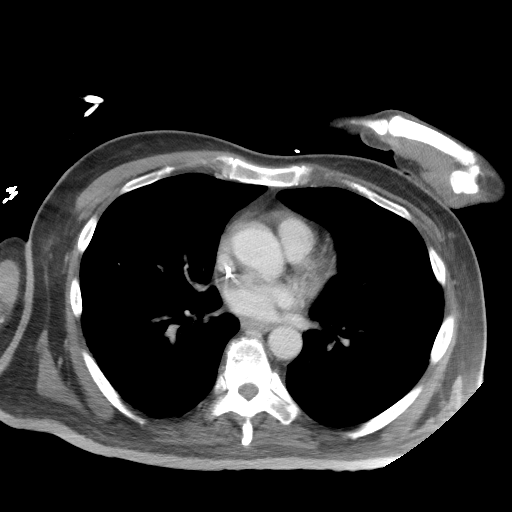

[Series 5: coronal st · coronal · 0.86mm/px · 3 of 110 slices shown]
[im 37/110  soft-tissue]
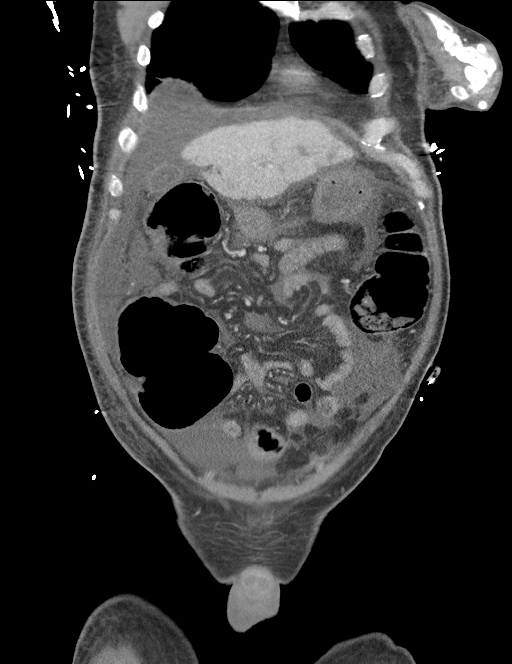
[im 49/110  soft-tissue]
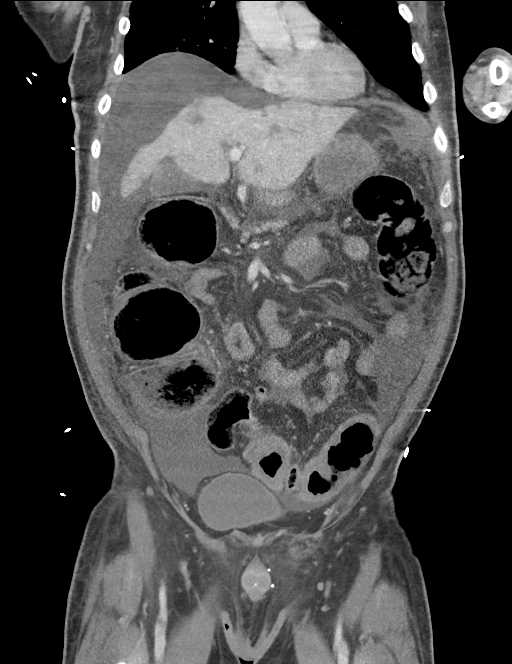
[im 61/110  soft-tissue]
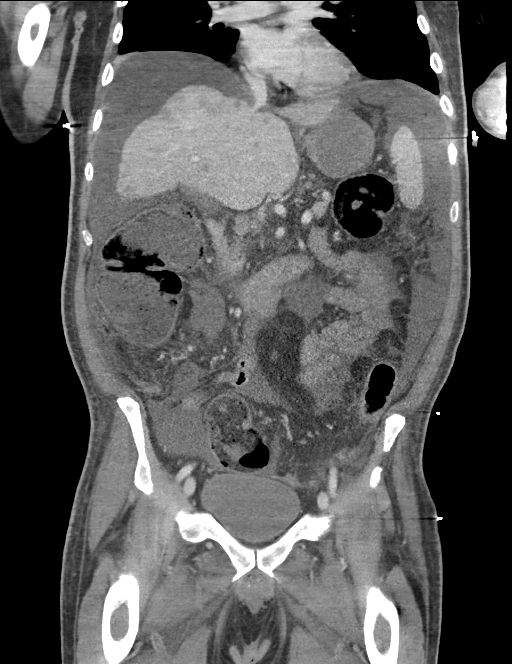

[15 of 46 positions shown; findings below may reference images not displayed]

FINDINGS: Lower chest: There is a 5 mm nodule in the right middle lobe. Right
lung base atelectasis. The visualized left lung is clear. Partially
visualized central venous line with tip at the cavoatrial junction.

No intra-abdominal free air. Moderate ascites, increased since the
prior CT.

Hepatobiliary: Multiple hepatic metastatic disease, relatively
similar to prior CT. There is irregularity of the liver contour
which may represent cirrhosis or pseudo cirrhosis. No intrahepatic
biliary dilatation. The gallbladder is unremarkable.

Pancreas: Unremarkable. No pancreatic ductal dilatation or
surrounding inflammatory changes.

Spleen: Normal in size without focal abnormality.

Adrenals/Urinary Tract: The adrenal glands unremarkable. There is a
1 cm right renal cyst. There is no hydronephrosis on either side.
There is symmetric enhancement and excretion of contrast by both
kidneys. The urinary bladder is grossly unremarkable.

Stomach/Bowel: There is moderate stool throughout the colon. There
is segmental thickening of the sigmoid colon which may represent
colitis or related to underlying infiltrative process. There is no
bowel obstruction. The appendix is unremarkable.

Vascular/Lymphatic: Mild aortoiliac atherosclerotic disease. The IVC
is unremarkable. No portal venous gas. No definite adenopathy.

Reproductive: The prostate and seminal vesicles are grossly
unremarkable.

Other: Diffuse subcutaneous edema and anasarca, new since the prior
CT.

Musculoskeletal: Degenerative changes of the spine. No acute osseous
pathology.
IMPRESSION: 1. Segmental thickening of the sigmoid colon may represent colitis
or related to underlying infiltrative process. No bowel obstruction.
Normal appendix.
2. Moderate ascites, increased since the prior CT.
3. Multiple hepatic metastatic disease, relatively similar to prior
CT.
4. Diffuse subcutaneous edema and anasarca, new since the prior CT.
5. There is a 5 mm nodule in the right middle lobe.
6. Aortic Atherosclerosis ([id number]).

## 2022-12-26 IMAGING — US US PARACENTESIS
1 series · 5 of 5 positions shown · non-contrast
Comparison: none

INDICATION: Malignant ascites

[Series 1: us paracentesis · 5 of 5 slices shown]
[im 1/5]
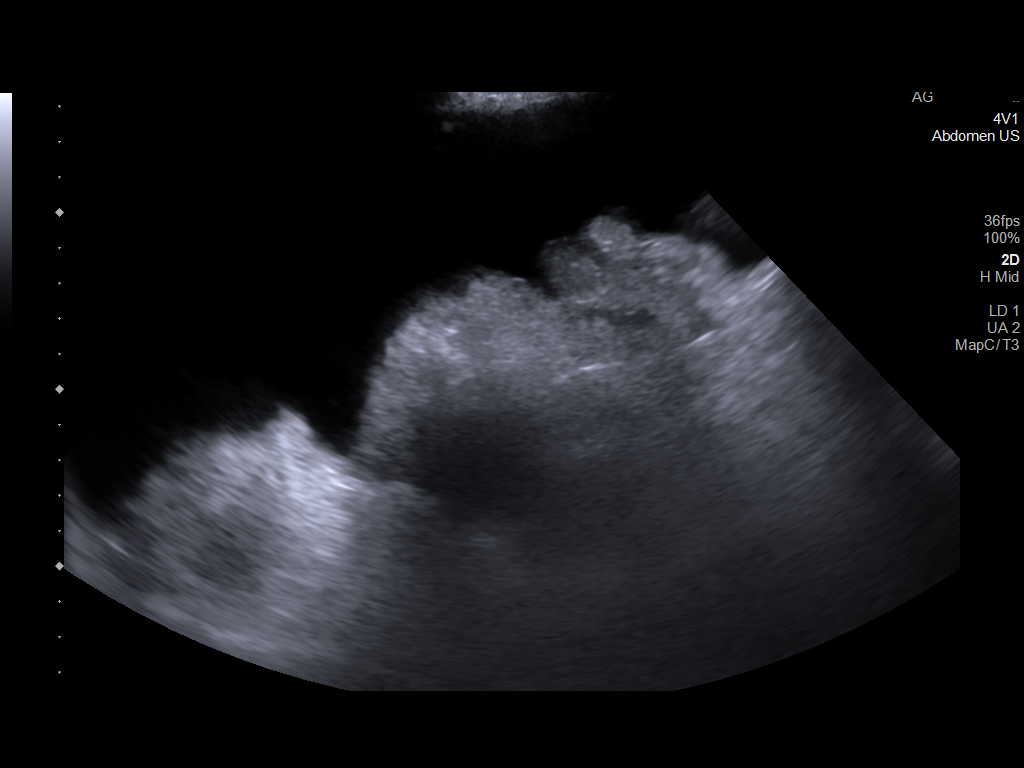
[im 2/5]
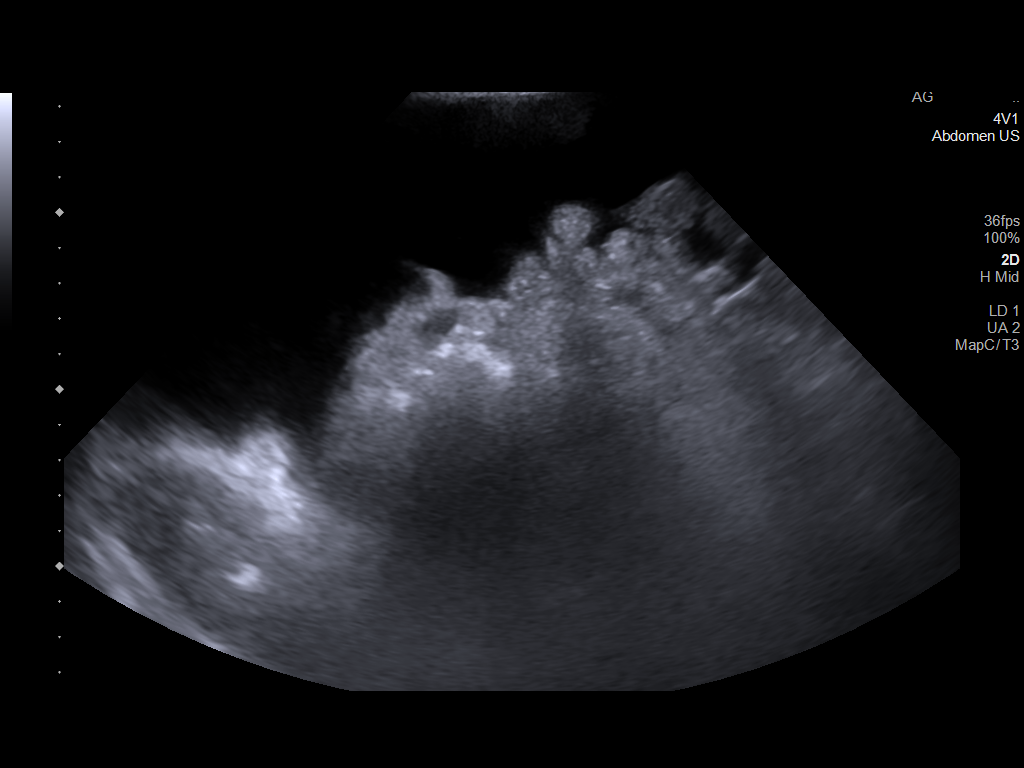
[im 3/5]
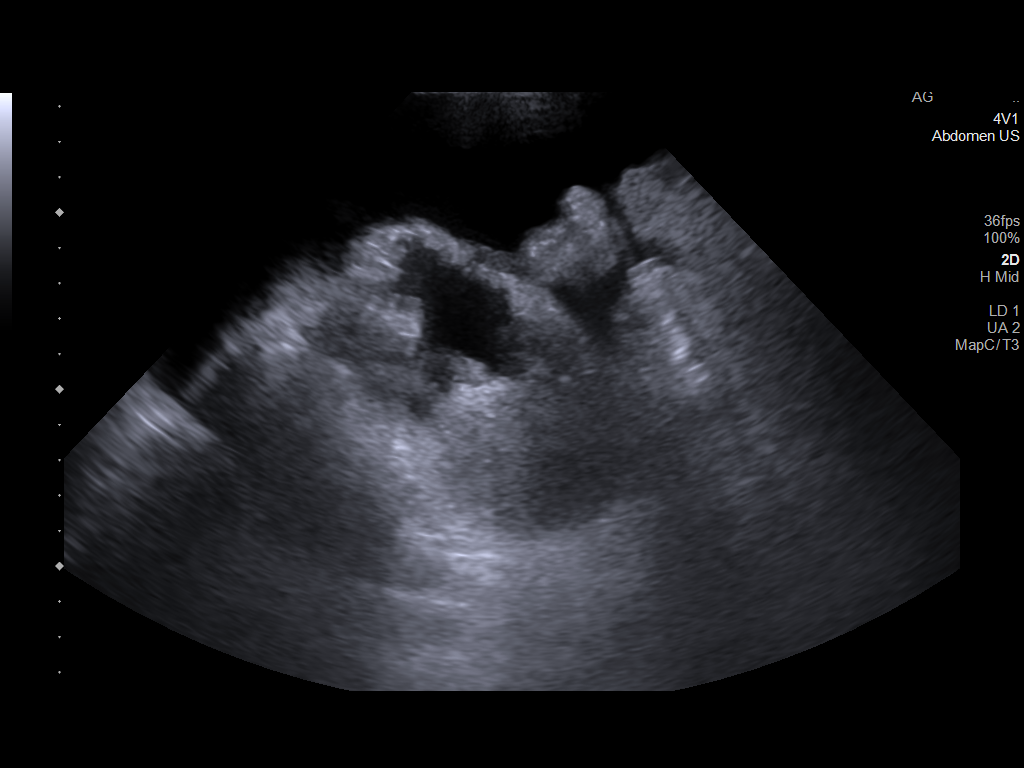
[im 4/5]
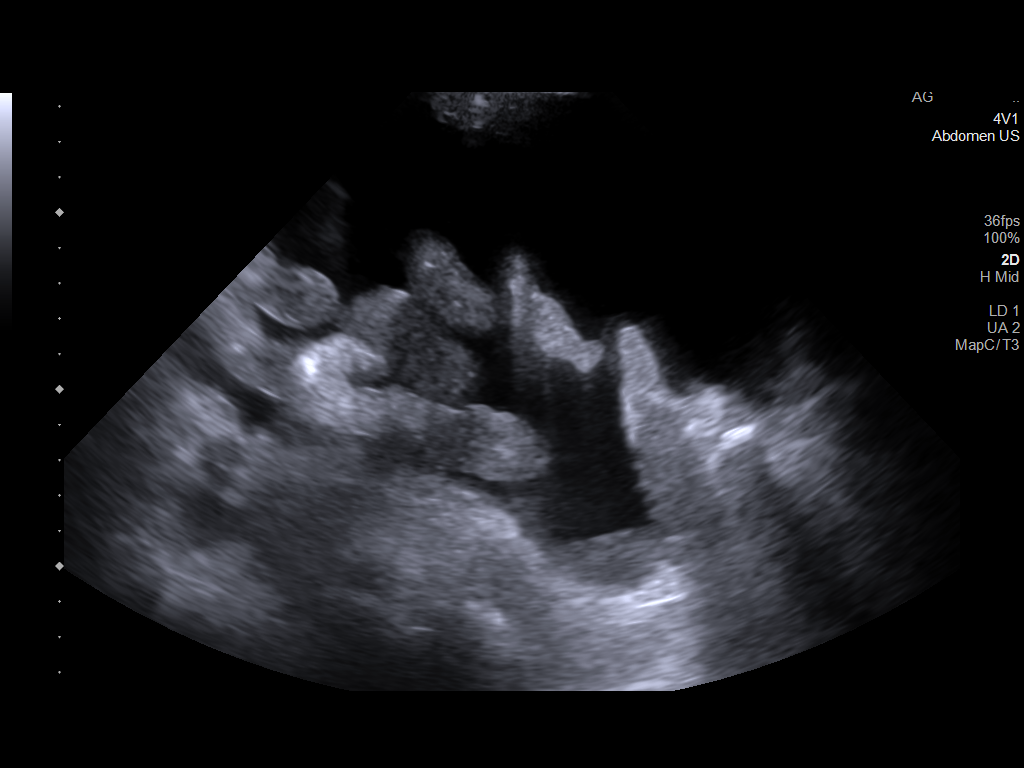
[im 5/5]
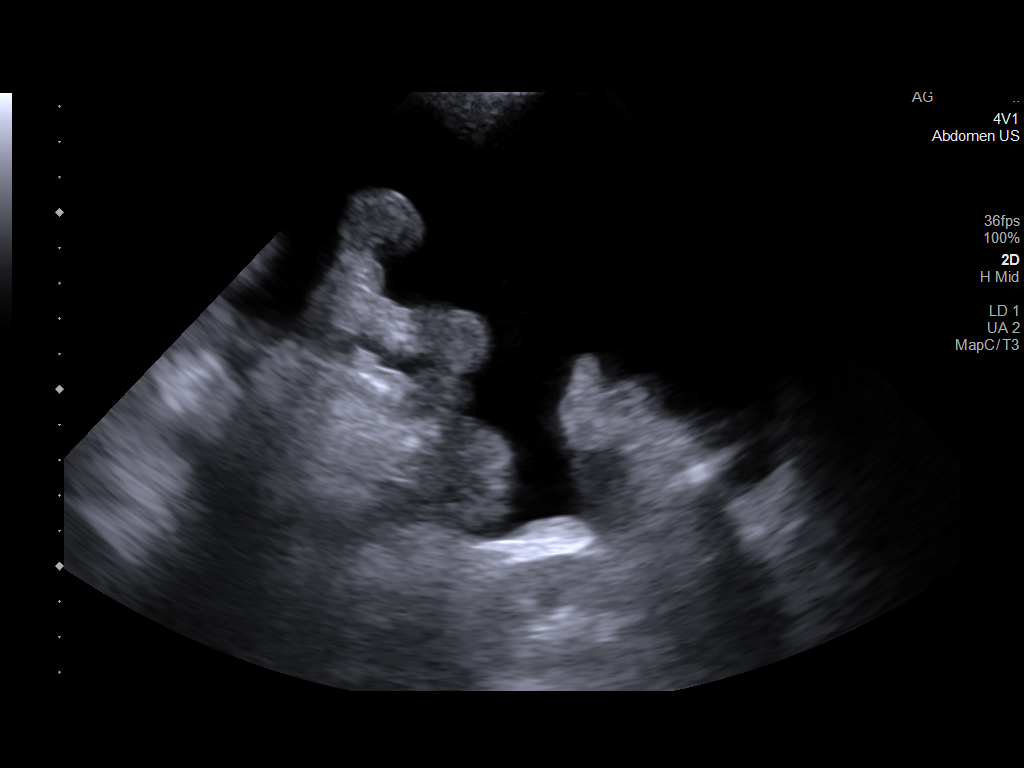

[5 of 5 positions shown; findings below may reference images not displayed]

EXAM:
ULTRASOUND GUIDED THERAPEUTIC PARACENTESIS

MEDICATIONS:
None.

COMPLICATIONS:
None immediate.

PROCEDURE:
Informed written consent was obtained from the patient after a
discussion of the risks, benefits and alternatives to treatment. A
timeout was performed prior to the initiation of the procedure.

Initial ultrasound scanning demonstrates a large amount of ascites
within the right lower abdominal quadrant. The right lower abdomen
was prepped and draped in the usual sterile fashion. 1% lidocaine
was used for local anesthesia.

Following this, a 5 French Yueh catheter was introduced. An
ultrasound image was saved for documentation purposes. The
paracentesis was performed. The catheter was removed and a dressing
was applied. The patient tolerated the procedure well without
immediate post procedural complication.
Patient received post-procedure intravenous albumin; see nursing
notes for details.
FINDINGS: A total of approximately 7.2 L of yellow ascitic fluid was removed.
IMPRESSION: Successful ultrasound-guided paracentesis yielding 7.2 liters of
peritoneal fluid.

## 2023-02-28 IMAGING — US US PARACENTESIS
1 series · 2 of 2 positions shown · non-contrast
Comparison: none

INDICATION: Recurrent malignant ascites

[Series 1: us paracentesis · 2 of 2 slices shown]
[im 1/2]
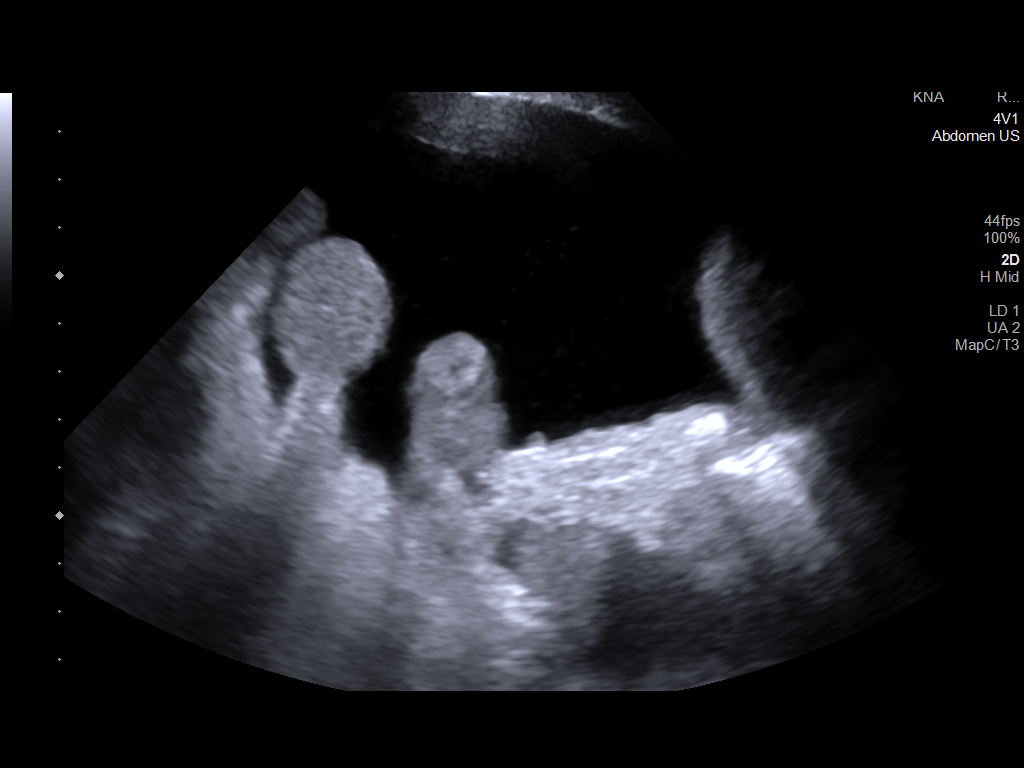
[im 2/2]
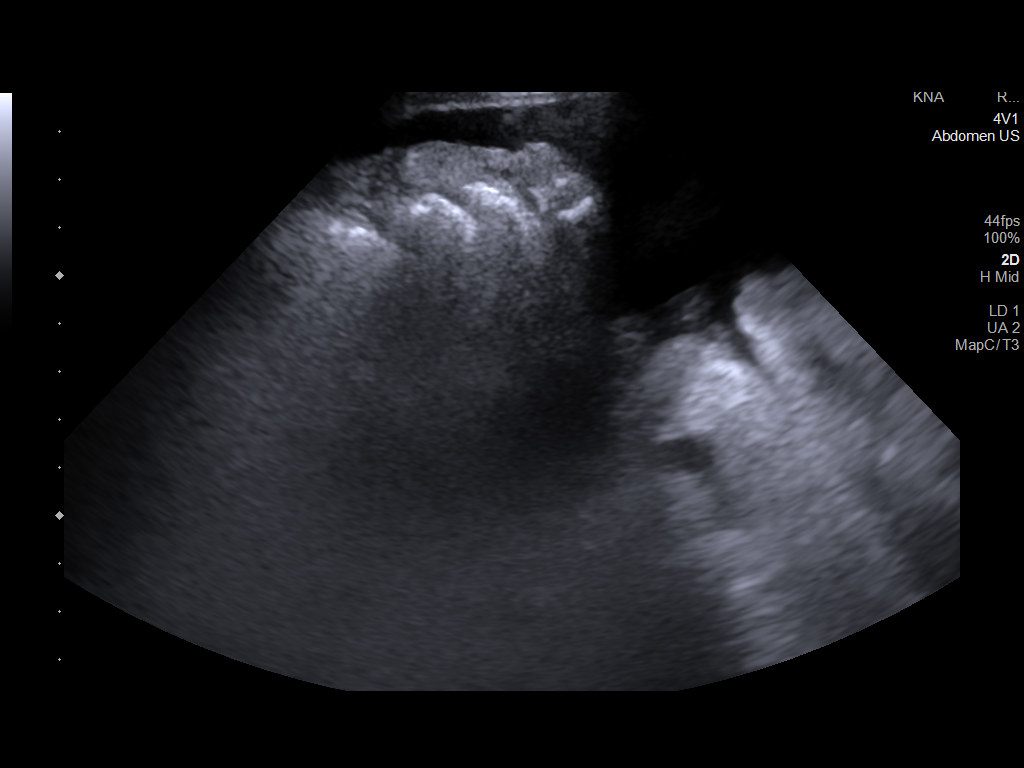

[2 of 2 positions shown; findings below may reference images not displayed]

EXAM:
ULTRASOUND GUIDED LLQ PARACENTESIS

MEDICATIONS:
10 cc 1% lidocaine

COMPLICATIONS:
None immediate.

PROCEDURE:
Informed written consent was obtained from the patient after a
discussion of the risks, benefits and alternatives to treatment. A
timeout was performed prior to the initiation of the procedure.

Initial ultrasound scanning demonstrates a large amount of ascites
within the left lower abdominal quadrant. The left lower abdomen was
prepped and draped in the usual sterile fashion. 1% lidocaine was
used for local anesthesia.

Following this, a Yueh catheter was introduced. An ultrasound image
was saved for documentation purposes. The paracentesis was
performed. The catheter was removed and a dressing was applied. The
patient tolerated the procedure well without immediate post
procedural complication.
Patient received post-procedure intravenous albumin; see nursing
notes for details.
FINDINGS: A total of approximately 4 liters of yellow fluid was removed.
IMPRESSION: Successful ultrasound-guided paracentesis yielding 4 liters of
peritoneal fluid.

Read by

Auntyjatty Delowr

## 2023-03-21 IMAGING — US US PARACENTESIS
1 series · 2 of 2 positions shown · non-contrast
Comparison: none

INDICATION: Refractory Ascites, metastatic disease

[Series 1: us paracentesis · 2 of 2 slices shown]
[im 1/2]
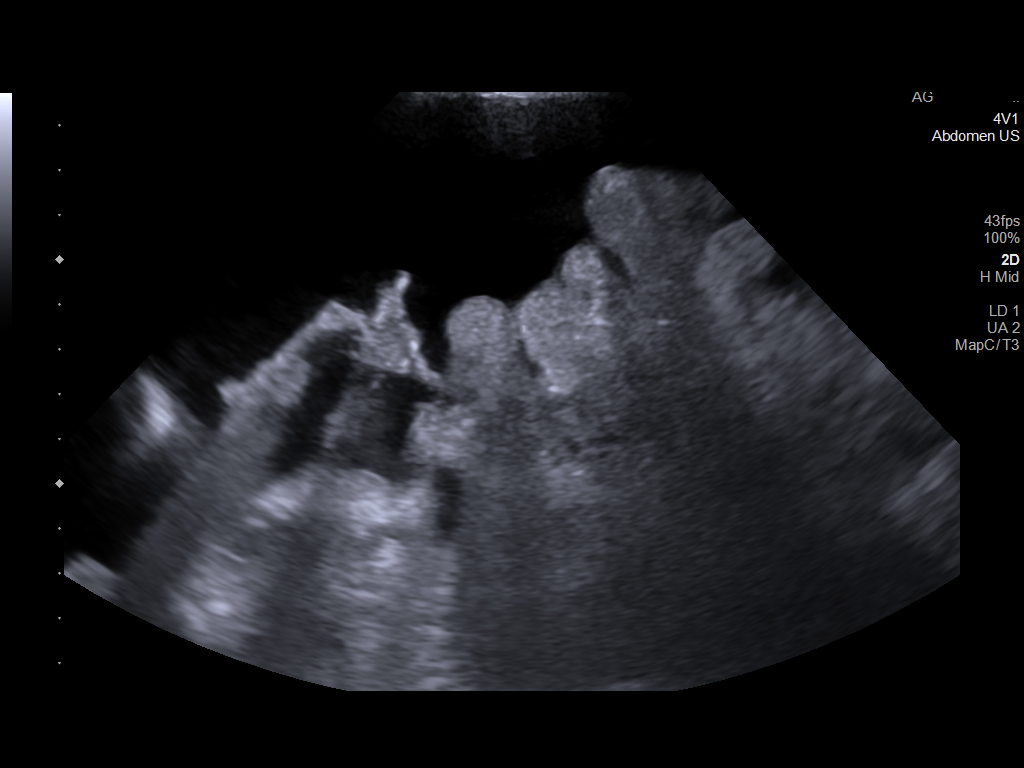
[im 2/2]
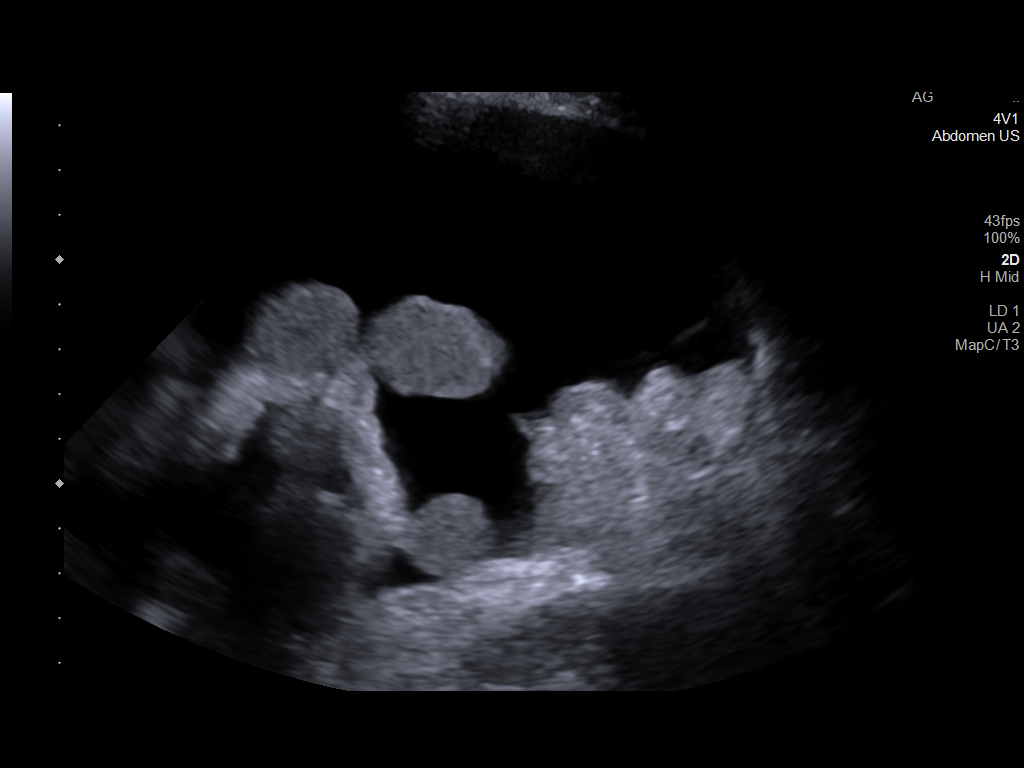

[2 of 2 positions shown; findings below may reference images not displayed]

EXAM:
ULTRASOUND GUIDED LLQ PARACENTESIS

MEDICATIONS:
None.

COMPLICATIONS:
None immediate.

PROCEDURE:
Informed written consent was obtained from the patient after a
discussion of the risks, benefits and alternatives to treatment. A
timeout was performed prior to the initiation of the procedure.

Initial ultrasound scanning demonstrates a large amount of ascites
within the left lower abdominal quadrant. The left lower abdomen was
prepped and draped in the usual sterile fashion. 1% lidocaine was
used for local anesthesia.

Following this, a 19 gauge, 7-cm, Yueh catheter was introduced. An
ultrasound image was saved for documentation purposes. The
paracentesis was performed. The catheter was removed and a dressing
was applied. The patient tolerated the procedure well without
immediate post procedural complication.
Patient received post-procedure intravenous albumin; see nursing
notes for details.
FINDINGS: A total of approximately 5L of yellow fluid was removed.
IMPRESSION: Successful ultrasound-guided paracentesis yielding 5 liters of
peritoneal fluid.

## 2023-04-02 IMAGING — DX DG CHEST 1V PORT
1 series · 1 of 1 positions shown · non-contrast
Comparison: 09/09/2021

CLINICAL DATA: Generalized weakness.  Colon cancer

EXAM:
PORTABLE CHEST 1 VIEW

[chest ap]
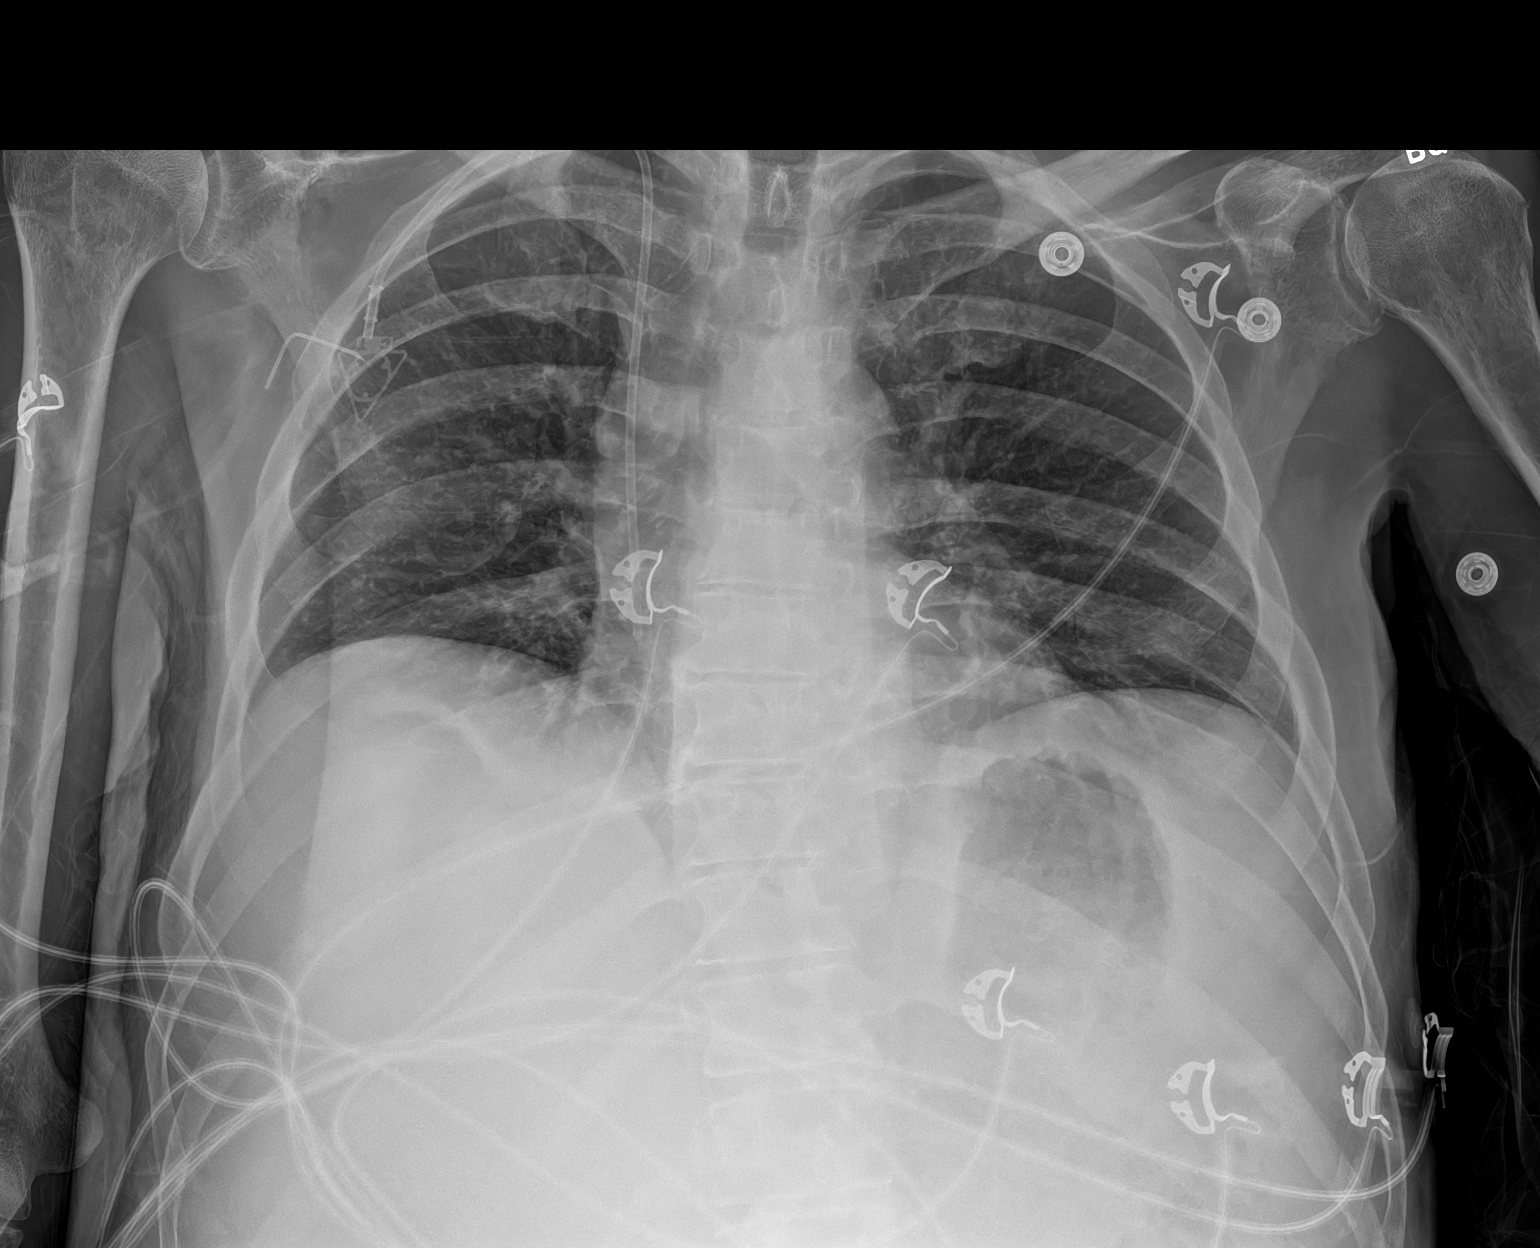

[1 of 1 positions shown; findings below may reference images not displayed]

FINDINGS: Right-sided chest port remains in place. Heart size is normal.
Slightly low lung volumes. No focal airspace consolidation, pleural
effusion, or pneumothorax. No acute bony findings.
IMPRESSION: No active disease.
